# Patient Record
Sex: Male | Born: 1970 | Race: White | Hispanic: No | Marital: Single | Smoking: Current some day smoker
Health system: Southern US, Community
[De-identification: ages and names within clinical notes are randomized; demographics above are authoritative.]

## PROBLEM LIST (undated history)

## (undated) DIAGNOSIS — F1010 Alcohol abuse, uncomplicated: Secondary | ICD-10-CM

## (undated) DIAGNOSIS — F419 Anxiety disorder, unspecified: Secondary | ICD-10-CM

## (undated) DIAGNOSIS — I10 Essential (primary) hypertension: Secondary | ICD-10-CM

## (undated) DIAGNOSIS — A419 Sepsis, unspecified organism: Secondary | ICD-10-CM

## (undated) DIAGNOSIS — E119 Type 2 diabetes mellitus without complications: Secondary | ICD-10-CM

## (undated) DIAGNOSIS — K859 Acute pancreatitis without necrosis or infection, unspecified: Secondary | ICD-10-CM

## (undated) HISTORY — PX: ROTATOR CUFF REPAIR: SHX139

## (undated) HISTORY — PX: HERNIA REPAIR: SHX51

---

## 2004-11-15 ENCOUNTER — Emergency Department: Payer: Self-pay | Admitting: Emergency Medicine

## 2005-06-27 ENCOUNTER — Emergency Department: Payer: Self-pay | Admitting: Emergency Medicine

## 2007-07-30 ENCOUNTER — Emergency Department: Payer: Self-pay | Admitting: Unknown Physician Specialty

## 2007-07-30 ENCOUNTER — Other Ambulatory Visit: Payer: Self-pay

## 2007-11-10 ENCOUNTER — Other Ambulatory Visit: Payer: Self-pay

## 2007-11-10 ENCOUNTER — Emergency Department: Payer: Self-pay | Admitting: Emergency Medicine

## 2007-12-31 ENCOUNTER — Emergency Department: Payer: Self-pay | Admitting: Emergency Medicine

## 2008-04-29 ENCOUNTER — Emergency Department: Payer: Self-pay | Admitting: Internal Medicine

## 2008-11-24 ENCOUNTER — Emergency Department: Payer: Self-pay | Admitting: Emergency Medicine

## 2009-03-14 ENCOUNTER — Emergency Department: Payer: Self-pay | Admitting: Emergency Medicine

## 2009-09-20 ENCOUNTER — Emergency Department: Payer: Self-pay | Admitting: Emergency Medicine

## 2010-04-04 ENCOUNTER — Emergency Department: Payer: Self-pay | Admitting: Emergency Medicine

## 2010-11-11 ENCOUNTER — Emergency Department: Payer: Self-pay | Admitting: Emergency Medicine

## 2011-06-13 ENCOUNTER — Emergency Department: Payer: Self-pay | Admitting: Emergency Medicine

## 2012-10-16 ENCOUNTER — Emergency Department: Payer: Self-pay | Admitting: Emergency Medicine

## 2012-10-16 LAB — URINALYSIS, COMPLETE
Bilirubin,UR: NEGATIVE
Blood: NEGATIVE
Glucose,UR: NEGATIVE mg/dL (ref 0–75)
Leukocyte Esterase: NEGATIVE
Nitrite: POSITIVE
RBC,UR: <1 /HPF (ref 0–5)
Specific Gravity: 1.025 (ref 1.003–1.030)
Squamous Epithelial: NONE SEEN
WBC UR: 1 /HPF (ref 0–5)

## 2012-10-16 LAB — COMPREHENSIVE METABOLIC PANEL
Albumin: 3.4 g/dL (ref 3.4–5.0)
BUN: 8 mg/dL (ref 7–18)
Calcium, Total: 8.6 mg/dL (ref 8.5–10.1)
Chloride: 97 mmol/L — ABNORMAL LOW (ref 98–107)
Co2: 25 mmol/L (ref 21–32)
Creatinine: 0.92 mg/dL (ref 0.60–1.30)
EGFR (African American): >60
EGFR (Non-African Amer.): >60
Glucose: 122 mg/dL — ABNORMAL HIGH (ref 65–99)
Osmolality: 260 (ref 275–301)
Potassium: 3.7 mmol/L (ref 3.5–5.1)
SGPT (ALT): 37 U/L (ref 12–78)
Sodium: 130 mmol/L — ABNORMAL LOW (ref 136–145)
Total Protein: 7.8 g/dL (ref 6.4–8.2)

## 2012-10-16 LAB — LIPASE, BLOOD: Lipase: 272 U/L (ref 73–393)

## 2012-10-16 LAB — CBC
HCT: 38.5 % — ABNORMAL LOW (ref 40.0–52.0)
MCHC: 34.9 g/dL (ref 32.0–36.0)
MCV: 97 fL (ref 80–100)
Platelet: 120 10*3/uL — ABNORMAL LOW (ref 150–440)
RBC: 3.98 10*6/uL — ABNORMAL LOW (ref 4.40–5.90)
WBC: 11.4 10*3/uL — ABNORMAL HIGH (ref 3.8–10.6)

## 2014-08-18 ENCOUNTER — Emergency Department: Payer: Self-pay | Admitting: Emergency Medicine

## 2014-08-18 LAB — CBC
HCT: 47.3 % (ref 40.0–52.0)
HGB: 16.4 g/dL (ref 13.0–18.0)
MCH: 38 pg — ABNORMAL HIGH (ref 26.0–34.0)
MCHC: 34.6 g/dL (ref 32.0–36.0)
MCV: 110 fL — ABNORMAL HIGH (ref 80–100)
Platelet: 199 10*3/uL (ref 150–440)
RBC: 4.31 10*6/uL — ABNORMAL LOW (ref 4.40–5.90)
RDW: 14.5 % (ref 11.5–14.5)
WBC: 6.3 10*3/uL (ref 3.8–10.6)

## 2014-08-18 LAB — DRUG SCREEN, URINE
Amphetamines, Ur Screen: NEGATIVE (ref ?–1000)
BENZODIAZEPINE, UR SCRN: NEGATIVE (ref ?–200)
Barbiturates, Ur Screen: NEGATIVE (ref ?–200)
CANNABINOID 50 NG, UR ~~LOC~~: NEGATIVE (ref ?–50)
COCAINE METABOLITE, UR ~~LOC~~: POSITIVE (ref ?–300)
MDMA (Ecstasy)Ur Screen: NEGATIVE (ref ?–500)
METHADONE, UR SCREEN: NEGATIVE (ref ?–300)
OPIATE, UR SCREEN: NEGATIVE (ref ?–300)
PHENCYCLIDINE (PCP) UR S: NEGATIVE (ref ?–25)
TRICYCLIC, UR SCREEN: NEGATIVE (ref ?–1000)

## 2014-08-18 LAB — COMPREHENSIVE METABOLIC PANEL
ALBUMIN: 3.9 g/dL (ref 3.4–5.0)
ALK PHOS: 66 U/L
ALT: 54 U/L
AST: 75 U/L — AB (ref 15–37)
Anion Gap: 11 (ref 7–16)
BUN: 8 mg/dL (ref 7–18)
Bilirubin,Total: 0.5 mg/dL (ref 0.2–1.0)
CO2: 24 mmol/L (ref 21–32)
CREATININE: 0.76 mg/dL (ref 0.60–1.30)
Calcium, Total: 8.4 mg/dL — ABNORMAL LOW (ref 8.5–10.1)
Chloride: 107 mmol/L (ref 98–107)
EGFR (African American): >60
EGFR (Non-African Amer.): >60
GLUCOSE: 151 mg/dL — AB (ref 65–99)
Osmolality: 284 (ref 275–301)
POTASSIUM: 3.8 mmol/L (ref 3.5–5.1)
SODIUM: 142 mmol/L (ref 136–145)
Total Protein: 7.8 g/dL (ref 6.4–8.2)

## 2014-08-18 LAB — URINALYSIS, COMPLETE
Bacteria: NONE SEEN
Bilirubin,UR: NEGATIVE
Blood: NEGATIVE
GLUCOSE, UR: NEGATIVE mg/dL (ref 0–75)
LEUKOCYTE ESTERASE: NEGATIVE
NITRITE: NEGATIVE
PH: 6 (ref 4.5–8.0)
PROTEIN: NEGATIVE
RBC,UR: NONE SEEN /HPF (ref 0–5)
Specific Gravity: 1.024 (ref 1.003–1.030)
Squamous Epithelial: NONE SEEN

## 2014-08-18 LAB — ETHANOL: Ethanol: 258 mg/dL

## 2014-08-18 LAB — ACETAMINOPHEN LEVEL

## 2014-08-18 LAB — SALICYLATE LEVEL

## 2014-08-19 LAB — CLOSTRIDIUM DIFFICILE(ARMC)

## 2015-01-29 NOTE — Consult Note (Signed)
PATIENT NAME:  Andrew Buchanan, Andrew Buchanan MR#:  [id number] DATE OF BIRTH:  1971/03/23  DATE OF CONSULTATION:  08/18/2014  REFERRING PHYSICIAN:   CONSULTING PHYSICIAN:  Audery AmelJohn Buchanan. Rosibel Giacobbe, MD  IDENTIFYING INFORMATION AND REASON FOR CONSULTATION: A 44 year old man with a history of alcohol and substance abuse who was petitioned by his mother.   CHIEF COMPLAINT: "Not much."   HISTORY OF PRESENT ILLNESS: Information obtained from the past chart and the current chart, the commitment petition and the patient. Commitment paperwork filed by his mother states that he had come over to the house and threatened to kill his mother and also had made threats to kill his father and kill other people. She says that she thinks his mood is out of control and that his behavior is out of control and implies that he has bipolar disorder. The patient dismisses this entirely as being something made up by his mother. He says that he does not think he has much of a problem at all. He admits that he was talking to his mother and that she was demanding that he repay some of the money that he owed her. He denies that he made any threatening statements at all. He says his mood feels fine. His sleep sounds like it is erratic but does not seem to be presented as a problem. Denies suicidal or homicidal ideation. Denies any psychotic symptoms. The patient refuses to be specific about the amount that he is drinking, but calls it, "not much." Says he does not think that his drinking is a problem for him. He denies that he is abusing any other kind of drugs whatsoever.   PAST PSYCHIATRIC HISTORY: This patient has had multiple visits to our Emergency Room going back several years which follow a remarkably consistent pattern, exactly like what he is having today. His mother petitions him into the hospital, he is intoxicated, he does not seem to have any acute dangerousness and he is discharged. The patient denies that he has ever followed up with any  kind of outpatient psychiatric care. No history of suicide attempts. Denies being violent with his family.   SOCIAL HISTORY: The patient lives with his daughter. 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   PAST MEDICAL HISTORY: Says he has high blood pressure.   SUBSTANCE ABUSE HISTORY: As noted above, he drinks, which he admits to. Denies any history of seizures. Does not feel his drinking is a problem. Denies other drug abuse.   FAMILY HISTORY: Denies any family history of mental illness.   REVIEW OF SYSTEMS: No physical complaints currently. Denies any suicidal ideation. Denies any psychotic symptoms. Denies homicidal ideation. Does not appear to be in acute distress. Full review of systems unremarkable.   MENTAL STATUS EXAMINATION: Slightly disheveled gentleman who looks his stated age, who is passively cooperative with the interview. Eye contact good. Psychomotor activity calm. Speech decreased in total amount. Answers questions as curtly as he possibly can. Affect flat. Mood stated as fine. Thoughts show no sign of acute delusions. They appear to be organized but he is not giving us much information. Denies suicidal or homicidal ideation. Denies any hallucinations. He can recall 3/3 objects immediately, 1/3 at three minutes. He is alert and oriented x 4. His judgment and insight about his substance use is clearly impaired. Intelligence appears to be normal.   LABORATORY RESULTS: Drug screen is positive for cocaine. Urinalysis unremarkable. Salicylates and acetaminophen negative. Alcohol level 258. Chemistry panel: Low calcium  8.4, elevated glucose. Fairly unremarkable CBC, nothing requiring acute treatment.   VITAL SIGNS: Blood pressure 121/88, respirations 20, pulse 105, temperature 98.3.   ASSESSMENT: This is a 44 year old man with a history of alcohol abuse, but who has no insight into it being an acute problem and does not appear to be at acute risk from it. Also abusing  cocaine, which he will not admit to. This is one of several episodes of his mother petitioning him to the hospital and then he being found to not seem to have any acute illness. I have tried to call the mother on the phone and was not able to reach anyone. At this point, I suspect that the patient might have gotten into an argument with his mother, but he does not appear to be acutely agitated or threatening, there is no evidence of acute dangerousness, but he is intoxicated. I think we need to observe him at least until he sobers up before making a decision.   TREATMENT PLAN: Monitor vitals. Observe in the Emergency Room. Re-evaluate over the next day before deciding on any further disposition.   DIAGNOSIS, PRINCIPAL AND PRIMARY:  AXIS I:  1.  Alcohol abuse.  2.  Cocaine abuse    ____________________________ Audery Amel, MD jtc:TT D: 08/18/2014 15:41:33 ET Buchanan: 08/18/2014 15:59:05 ET JOB#: [id number]  cc: Audery Amel, MD, <Dictator> Audery Amel MD ELECTRONICALLY SIGNED 08/23/2014 17:03

## 2015-01-29 NOTE — Consult Note (Signed)
Psychiatry: PAtient seen and chart reviewed. PAtient is now sober. Mood is stable. He does have pain from a dental abcess and BP running high. No other new complaint. Affect calm and appropriate. Denies any homicidal ideation. Has positive plans for the future. No sign of delirium. Patient still does not see SA as an acute probleb. No longer meets commitment criteria. He is now off IVC and can be discharged from the ER. Encourage him to consider SA treatment in the future,. No change to diagnosis.  Electronic Signatures: Clapacs, Jackquline DenmarkJohn T (MD)  (Signed on 12-Nov-15 12:45)  Authored  Last Updated: 12-Nov-15 12:45 by Audery Amellapacs, John T (MD)

## 2016-06-15 ENCOUNTER — Emergency Department
Admission: EM | Admit: 2016-06-15 | Discharge: 2016-06-15 | Disposition: A | Discharge status: Home / Self Care | Payer: BLUE CROSS/BLUE SHIELD | Attending: Emergency Medicine | Admitting: Emergency Medicine

## 2016-06-15 ENCOUNTER — Encounter: Payer: Self-pay | Admitting: Emergency Medicine

## 2016-06-15 DIAGNOSIS — F1010 Alcohol abuse, uncomplicated: Secondary | ICD-10-CM | POA: Diagnosis not present

## 2016-06-15 DIAGNOSIS — I10 Essential (primary) hypertension: Secondary | ICD-10-CM | POA: Diagnosis not present

## 2016-06-15 DIAGNOSIS — F1910 Other psychoactive substance abuse, uncomplicated: Secondary | ICD-10-CM

## 2016-06-15 DIAGNOSIS — F1920 Other psychoactive substance dependence, uncomplicated: Secondary | ICD-10-CM | POA: Diagnosis not present

## 2016-06-15 HISTORY — DX: Alcohol abuse, uncomplicated: F10.10

## 2016-06-15 HISTORY — DX: Acute pancreatitis without necrosis or infection, unspecified: K85.90

## 2016-06-15 HISTORY — DX: Anxiety disorder, unspecified: F41.9

## 2016-06-15 HISTORY — DX: Essential (primary) hypertension: I10

## 2016-06-15 LAB — URINALYSIS COMPLETE WITH MICROSCOPIC (ARMC ONLY)
BILIRUBIN URINE: NEGATIVE
Bacteria, UA: NONE SEEN
GLUCOSE, UA: NEGATIVE mg/dL
HGB URINE DIPSTICK: NEGATIVE
KETONES UR: NEGATIVE mg/dL
LEUKOCYTES UA: NEGATIVE
Nitrite: NEGATIVE
Protein, ur: 30 mg/dL — AB
SPECIFIC GRAVITY, URINE: 1.028 (ref 1.005–1.030)
pH: 5 (ref 5.0–8.0)

## 2016-06-15 LAB — CBC
HEMATOCRIT: 42 % (ref 40.0–52.0)
Hemoglobin: 14.9 g/dL (ref 13.0–18.0)
MCH: 38.9 pg — ABNORMAL HIGH (ref 26.0–34.0)
MCHC: 35.4 g/dL (ref 32.0–36.0)
MCV: 109.9 fL — AB (ref 80.0–100.0)
Platelets: 101 10*3/uL — ABNORMAL LOW (ref 150–440)
RBC: 3.82 MIL/uL — ABNORMAL LOW (ref 4.40–5.90)
RDW: 14.7 % — AB (ref 11.5–14.5)
WBC: 4.9 10*3/uL (ref 3.8–10.6)

## 2016-06-15 LAB — COMPREHENSIVE METABOLIC PANEL
ALBUMIN: 5 g/dL (ref 3.5–5.0)
ALT: 60 U/L (ref 17–63)
AST: 99 U/L — AB (ref 15–41)
Alkaline Phosphatase: 65 U/L (ref 38–126)
Anion gap: 14 (ref 5–15)
BUN: 13 mg/dL (ref 6–20)
CHLORIDE: 96 mmol/L — AB (ref 101–111)
CO2: 24 mmol/L (ref 22–32)
CREATININE: 0.8 mg/dL (ref 0.61–1.24)
Calcium: 10.3 mg/dL (ref 8.9–10.3)
GFR calc Af Amer: >60 mL/min (ref >60)
GFR calc non Af Amer: >60 mL/min (ref >60)
GLUCOSE: 139 mg/dL — AB (ref 65–99)
Potassium: 3.4 mmol/L — ABNORMAL LOW (ref 3.5–5.1)
SODIUM: 134 mmol/L — AB (ref 135–145)
Total Bilirubin: 1 mg/dL (ref 0.3–1.2)
Total Protein: 8.7 g/dL — ABNORMAL HIGH (ref 6.5–8.1)

## 2016-06-15 LAB — ETHANOL: Alcohol, Ethyl (B): <5 mg/dL (ref <5)

## 2016-06-15 LAB — LIPASE, BLOOD: Lipase: 77 U/L — ABNORMAL HIGH (ref 11–51)

## 2016-06-15 LAB — URINE DRUG SCREEN, QUALITATIVE (ARMC ONLY)
AMPHETAMINES, UR SCREEN: NOT DETECTED
BENZODIAZEPINE, UR SCRN: POSITIVE — AB
Barbiturates, Ur Screen: NOT DETECTED
Cannabinoid 50 Ng, Ur ~~LOC~~: NOT DETECTED
Cocaine Metabolite,Ur ~~LOC~~: NOT DETECTED
MDMA (Ecstasy)Ur Screen: NOT DETECTED
Methadone Scn, Ur: NOT DETECTED
OPIATE, UR SCREEN: POSITIVE — AB
PHENCYCLIDINE (PCP) UR S: NOT DETECTED
Tricyclic, Ur Screen: NOT DETECTED

## 2016-06-15 MED ORDER — IBUPROFEN 600 MG PO TABS
600.0000 mg | ORAL_TABLET | Freq: Once | ORAL | Status: DC
  Filled 2016-06-15: qty 1

## 2016-06-15 MED ORDER — ONDANSETRON 4 MG PO TBDP: 4.0000 mg | ORAL_TABLET | Freq: Three times a day (TID) | ORAL | 0 refills | Status: DC | PRN

## 2016-06-15 NOTE — BH Assessment (Signed)
Per [facility], patient had to pay a $300.00 up front co-pay due to having [employer]. Patient was unable to pay it. He talked with [facility] and set up arrangements to go to them on Monday. He also spoke with [facility]. He requested to have his lab work [facility]. Writer had the patient to sign a release of information form for both facilities. Hard copies and fax confirmation pages placed on the patients paper chart.

## 2016-06-15 NOTE — ED Provider Notes (Signed)
[facility] Emergency Department Provider Note   ____________________________________________    I have reviewed the triage vital signs and the nursing notes.   HISTORY  Chief Complaint Alcohol Problem     HPI Dayton ScrapeJohnnie Fannie Kneeodd Buchanan is a 45 y.o. male Who presents with a desire for alcohol detox. Patient reports he drinks daily and has done so for many years. He would like to get a residential treatment service. He last drank last night. He denies jitteriness, palpitations. No history of seizures.No nausea or vomiting currently. No abdominal pain currently.   Past Medical History:  Diagnosis Date  . Alcohol abuse   . Anxiety   . Hypertension   . Pancreatitis     There are no active problems to display for this patient.   History reviewed. No pertinent surgical history.  Prior to Admission medications   Medication Sig Start Date End Date Taking? Authorizing Provider  clonazePAM (KLONOPIN) 1 MG tablet Take 1 mg by mouth 3 (three) times daily as needed for anxiety.   Yes Historical Provider, MD  losartan (COZAAR) 50 MG tablet Take 50 mg by mouth daily.   Yes Historical Provider, MD     Allergies Antihistamines, chlorpheniramine-type  No family history on file.  Social History Social History  Substance Use Topics  . Smoking status: Never Smoker  . Smokeless tobacco: Never Used  . Alcohol use Yes    Review of Systems  Constitutional: No fever/chills Eyes: No visual changes.   Cardiovascular: Denies chest pain. Respiratory: Denies shortness of breath. Gastrointestinal: No abdominal pain.  No nausea, no vomiting.    Musculoskeletal: Negative for back pain. Skin: patient reportsbruising on his right arm Neurological: Negative for headaches or weakness  10-point ROS otherwise negative.  ____________________________________________   PHYSICAL EXAM:  VITAL SIGNS: ED Triage Vitals  Enc Vitals Group     BP 06/15/16 1310 (!)  155/107     Pulse Rate 06/15/16 1310 87     Resp 06/15/16 1310 20     Temp 06/15/16 1310 98 F (36.7 C)     Temp Source 06/15/16 1310 Oral     SpO2 06/15/16 1310 96 %     Weight 06/15/16 1306 186 lb (84.4 kg)     Height 06/15/16 1306 6' (1.829 m)     Head Circumference --      Peak Flow --      Pain Score --      Pain Loc --      Pain Edu? --      Excl. in GC? --     Constitutional: Alert and oriented. No acute distress. Pleasant and interactive Eyes: Conjunctivae are normal.  Head: Atraumatic. Nose: No congestion/rhinnorhea. Mouth/Throat: Mucous membranes are moist.   Neck:  Painless ROM Cardiovascular: Normal rate, regular rhythm. Grossly normal heart sounds.  Good peripheral circulation. Respiratory: Normal respiratory effort.  No retractions. Lungs CTAB. Gastrointestinal: Soft and nontender. No distention.  No CVA tenderness.fading bruise noted to the patient's right lateral abdomen, no tenderness to palpation Genitourinary: deferred Musculoskeletal: No lower extremity tenderness nor edema.  Warm and well perfused Neurologic:  Normal speech and language. No gross focal neurologic deficits are appreciated.  Skin:  Skin is warm, dry and intact. Bruises noted to right arm Psychiatric: Mood and affect are normal. Speech and behavior are normal.  ____________________________________________   LABS (all labs ordered are listed, but only abnormal results are displayed)  Labs Reviewed  COMPREHENSIVE METABOLIC PANEL - Abnormal; Notable  for the following:       Result Value   Sodium 134 (*)    Potassium 3.4 (*)    Chloride 96 (*)    Glucose, Bld 139 (*)    Total Protein 8.7 (*)    AST 99 (*)    All other components within normal limits  CBC - Abnormal; Notable for the following:    RBC 3.82 (*)    MCV 109.9 (*)    MCH 38.9 (*)    RDW 14.7 (*)    Platelets 101 (*)    All other components within normal limits  URINE DRUG SCREEN, QUALITATIVE ([facility] ONLY) - Abnormal;  Notable for the following:    Opiate, Ur Screen POSITIVE (*)    Benzodiazepine, Ur Scrn POSITIVE (*)    All other components within normal limits  LIPASE, BLOOD - Abnormal; Notable for the following:    Lipase 77 (*)    All other components within normal limits  URINALYSIS COMPLETEWITH MICROSCOPIC ([facility] ONLY) - Abnormal; Notable for the following:    Color, Urine AMBER (*)    APPearance CLEAR (*)    Protein, ur 30 (*)    Squamous Epithelial / LPF 0-5 (*)    All other components within normal limits  ETHANOL   ____________________________________________  EKG  None ____________________________________________  RADIOLOGY  None ____________________________________________   PROCEDURES  Procedure(s) performed: No    Critical Care performed: No ____________________________________________   INITIAL IMPRESSION / ASSESSMENT AND PLAN / ED COURSE  Pertinent labs & imaging results that were available during my care of the patient were reviewed by me and considered in my medical decision making (see chart for details).  Patient with history of chronic alcoholism. Desires detox. I have asked behavioral medicine team to attempt to arrange for [facility] bed  Clinical Course   ____________________________________________   FINAL CLINICAL IMPRESSION(S) / ED DIAGNOSES  Final diagnoses:  Alcohol abuse      NEW MEDICATIONS STARTED DURING THIS VISIT:  New Prescriptions   No medications on file     Note:  This document was prepared using Dragon voice recognition software and may include unintentional dictation errors.    Jene Every, MD 06/15/16 1524

## 2016-06-15 NOTE — ED Notes (Signed)
Pt refused ibuprofen. Pt states ibuprofen, tylenol and tramadol increase abdominal pain. Pt states he takes percocet at home for the pain. Dr. Cyril LoosenKinner made aware.

## 2016-06-15 NOTE — Discharge Instructions (Signed)
Please follow-up with [facility] as directed for detox and treatment of your alcohol and drug abuse.

## 2016-06-15 NOTE — ED Notes (Signed)
Pt reports he drinks a 1/5 of liquor on weekdays and 1/2 gallon of liquor on weekends. Pt states he would like help to stop drinking. Pt states he is worried that he has pancreatitis as well.

## 2016-06-15 NOTE — ED Provider Notes (Signed)
Discussed with Calvin from [facility]. Patient accepted to [facility], but the patient does not currently have the $300 co-pay he would need to provide to continue his treatment there. Patient chooses to go home with his mother for now, and on Monday he will follow up with [facility] when he has the money. He otherwise is insured and should not have other barriers to care. We'll provide him a prescription for Zofran for now. Return precautions given. She is medically and psychiatrically stable, not a danger to himself at this time, not a danger to others, no evidence of withdrawal at this time.   Sharman CheekPhillip Nioma Mccubbins, MD 06/15/16 1719

## 2016-06-15 NOTE — BH Assessment (Signed)
Assessment Note  Andrew Buchanan is an 45 y.o. male who presents to the ER seeking assistance for alcohol detox. He states he is drinking a fifth of liquor on a daily basis. He has drank like this for approximately two years. When he has tried to stop, he would start having withdrawal symptoms and start back drinking within two days. His symptoms of withdrawal are; vomiting with no blood, nausea, shakes, some dizziness and cold chills. He denies past and current seizures and black out.  He also admits to abusing pain pills and they are on a daily basis as well.  He denies having a history of aggression and violence. He also denies SI/HI and AV/H.Marland Kitchen.     Diagnosis: Alcohol Use Disorder, Severe  Past Medical History:  Past Medical History:  Diagnosis Date  . Alcohol abuse   . Anxiety   . Hypertension   . Pancreatitis     History reviewed. No pertinent surgical history.  Family History: No family history on file.  Social History:  reports that he has never smoked. He has never used smokeless tobacco. He reports that he drinks alcohol. His drug history is not on file.  Additional Social History:  Alcohol / Drug Use Pain Medications: See PTA Prescriptions: See PTA Over the Counter: See PTA History of alcohol / drug use?: Yes Longest period of sobriety (when/how long): "Two days" Negative Consequences of Use: Financial, Legal, Personal relationships, Work / School Substance #1 Name of Substance 1: Alcohol 1 - Age of First Use: 15 1 - Amount (size/oz): "A fifth" 1 - Frequency: Daily 1 - Duration: "Like this a year." 1 - Last Use / Amount: 06/14/2016 Substance #2 Name of Substance 2: "Percocet & Vicodin" 2 - Age of First Use: 45 2 - Amount (size/oz): "Four 10mg " 2 - Frequency: Daily 2 - Duration: "Four months now." 2 - Last Use / Amount: 06/15/2016  CIWA: CIWA-Ar BP: (!) 155/107 Pulse Rate: 87 Nausea and Vomiting: no nausea and no vomiting Tactile Disturbances: very mild  itching, pins and needles, burning or numbness Tremor: no tremor Auditory Disturbances: not present Paroxysmal Sweats: no sweat visible Visual Disturbances: not present Anxiety: moderately anxious, or guarded, so anxiety is inferred Headache, Fullness in Head: moderately severe Agitation: normal activity Orientation and Clouding of Sensorium: oriented and can do serial additions CIWA-Ar Total: 9 COWS:    Allergies:  Allergies  Allergen Reactions  . Antihistamines, Chlorpheniramine-Type Anaphylaxis    Home Medications:  (Not in a hospital admission)  OB/GYN Status:  No LMP for male patient.  General Assessment Data [facility] Assessment: In system Is this a Tele or Face-to-Face Assessment?: Face-to-Face Is this an Initial Assessment or a Re-assessment for this encounter?: Initial Assessment Marital status: Long term relationship Maiden name: n/a Is patient pregnant?: No Pregnancy Status: No Living Arrangements: Non-relatives/Friends, Parent Can pt return to current living arrangement?: Yes Admission Status: Voluntary Is patient capable of signing voluntary admission?: Yes Referral Source: Self/Family/Friend Insurance type: Scientist, research (physical sciences)BCBS  Medical Screening Exam [facility]-in ONLY) Medical Exam completed: Yes  Crisis Care Plan Living Arrangements: Non-relatives/Friends, Parent Legal Guardian: Other: (None) Name of Psychiatrist: Reports of none Name of Therapist: Reports of none  Education Status Is patient currently in school?: No Current Grade: n/a Highest grade of school patient has completed: Associate Degree Name of school: n/a Contact person: n/a  Risk to self with the past 6 months Suicidal Ideation: No Has patient been a risk to self within  the past 6 months prior to admission? : No Suicidal Intent: No Has patient had any suicidal intent within the past 6 months prior to admission? : No Is patient at risk for suicide?: No Suicidal Plan?:  No Has patient had any suicidal plan within the past 6 months prior to admission? : No Access to Means: No What has been your use of drugs/alcohol within the last 12 months?: Alcohol, Percocet & Vicodin Previous Attempts/Gestures: No How many times?: 0 Other Self Harm Risks: Active Addiction Triggers for Past Attempts: None known Intentional Self Injurious Behavior: None Family Suicide History: No Recent stressful life event(s): Financial Problems, Loss (Comment), Conflict (Comment), Legal Issues, Recent negative physical changes, Other (Comment) Persecutory voices/beliefs?: No Depression: Yes Depression Symptoms: Tearfulness, Feeling worthless/self pity, Loss of interest in usual pleasures, Fatigue Substance abuse history and/or treatment for substance abuse?: Yes Suicide prevention information given to non-admitted patients: Not applicable  Risk to Others within the past 6 months Homicidal Ideation: No Does patient have any lifetime risk of violence toward others beyond the six months prior to admission? : No Thoughts of Harm to Others: No Current Homicidal Intent: No Current Homicidal Plan: No Access to Homicidal Means: No Identified Victim: Reports of none History of harm to others?: No Assessment of Violence: None Noted Violent Behavior Description: Reports of none Does patient have access to weapons?: No Criminal Charges Pending?: No Does patient have a court date: No Is patient on probation?: No  Psychosis Hallucinations: None noted Delusions: None noted  Mental Status Report Appearance/Hygiene: Unremarkable Eye Contact: Good Motor Activity: Restlessness, Unremarkable, Freedom of movement Speech: Logical/coherent, Unremarkable, Rapid Level of Consciousness: Alert Mood: Depressed, Anxious, Guilty, Helpless, Sad, Pleasant Affect: Appropriate to circumstance, Anxious, Sad Anxiety Level: Minimal Thought Processes: Coherent, Relevant Judgement: Partial Orientation:  Person, Place, Time, Situation, Appropriate for developmental age Obsessive Compulsive Thoughts/Behaviors: None  Cognitive Functioning Concentration: Normal Memory: Recent Intact, Remote Intact IQ: Average Insight: Fair Impulse Control: Fair Appetite: Good Weight Loss: 0 Weight Gain: 0 Sleep: Decreased Total Hours of Sleep: 3 Vegetative Symptoms: None  ADLScreening [facility] Assessment Services) Patient's cognitive ability adequate to safely complete daily activities?: Yes Patient able to express need for assistance with ADLs?: Yes Independently performs ADLs?: Yes (appropriate for developmental age)  Prior Inpatient Therapy Prior Inpatient Therapy: Yes Prior Therapy Dates: 1998 & 1994 Prior Therapy Facilty/Provider(s): "[facility]" [place], [place]) Reason for Treatment: Alcohol  Prior Outpatient Therapy Prior Outpatient Therapy: No Prior Therapy Dates: Reports of none Prior Therapy Facilty/Provider(s): Reports of none Reason for Treatment: Reports of none Does patient have an ACCT team?: No Does patient have Intensive In-House Services?  : No Does patient have [facility] services? : No Does patient have P4CC services?: No  ADL Screening (condition at time of admission) Patient's cognitive ability adequate to safely complete daily activities?: Yes Is the patient deaf or have difficulty hearing?: No Does the patient have difficulty seeing, even when wearing glasses/contacts?: No Does the patient have difficulty concentrating, remembering, or making decisions?: No Patient able to express need for assistance with ADLs?: Yes Does the patient have difficulty dressing or bathing?: No Independently performs ADLs?: Yes (appropriate for developmental age) Does the patient have difficulty walking or climbing stairs?: No Weakness of Legs: None Weakness of Arms/Hands: None  Home Assistive Devices/Equipment Home Assistive Devices/Equipment: None  Therapy Consults (therapy  consults require a physician order) PT Evaluation Needed: No OT Evalulation Needed: No SLP Evaluation Needed: No Abuse/Neglect Assessment (Assessment to be complete while patient  is alone) Physical Abuse: Denies Verbal Abuse: Denies Sexual Abuse: Denies Exploitation of patient/patient's resources: Denies Self-Neglect: Denies Values / Beliefs Cultural Requests During Hospitalization: None Spiritual Requests During Hospitalization: None Consults Spiritual Care Consult Needed: No Social Work Consult Needed: No Merchant navy officer (For Healthcare) Does patient have an advance directive?: No    Additional Information 1:1 In Past 12 Months?: No CIRT Risk: No Elopement Risk: No Does patient have medical clearance?: Yes  Child/Adolescent Assessment Running Away Risk: Denies (Patient is an adult)  Disposition:  Disposition Initial Assessment Completed for this Encounter: Yes Disposition of Patient: Other dispositions (Refer to [employer])  On Site Evaluation by:   Reviewed with Physician:    Lilyan Gilford MS, LCAS, LPC, NCC, CCSI Therapeutic Triage Specialist 06/15/2016 5:31 PM

## 2016-06-15 NOTE — ED Triage Notes (Signed)
Presents with family  Requesting alcohol detox  Also having some abd pain   Pos  N/v  Last time vomited was this am

## 2018-11-26 ENCOUNTER — Encounter: Payer: Self-pay | Admitting: *Deleted

## 2018-11-26 ENCOUNTER — Emergency Department
Admission: EM | Admit: 2018-11-26 | Discharge: 2018-11-26 | Disposition: A | Discharge status: Home / Self Care | Payer: BLUE CROSS/BLUE SHIELD | Attending: Emergency Medicine | Admitting: Emergency Medicine

## 2018-11-26 ENCOUNTER — Other Ambulatory Visit: Payer: Self-pay

## 2018-11-26 DIAGNOSIS — R45851 Suicidal ideations: Secondary | ICD-10-CM | POA: Insufficient documentation

## 2018-11-26 DIAGNOSIS — F310 Bipolar disorder, current episode hypomanic: Secondary | ICD-10-CM

## 2018-11-26 DIAGNOSIS — F10121 Alcohol abuse with intoxication delirium: Secondary | ICD-10-CM | POA: Diagnosis present

## 2018-11-26 DIAGNOSIS — F419 Anxiety disorder, unspecified: Secondary | ICD-10-CM | POA: Diagnosis not present

## 2018-11-26 DIAGNOSIS — F1010 Alcohol abuse, uncomplicated: Secondary | ICD-10-CM

## 2018-11-26 DIAGNOSIS — F1090 Alcohol use, unspecified, uncomplicated: Secondary | ICD-10-CM

## 2018-11-26 DIAGNOSIS — Z79899 Other long term (current) drug therapy: Secondary | ICD-10-CM | POA: Diagnosis not present

## 2018-11-26 DIAGNOSIS — F10120 Alcohol abuse with intoxication, uncomplicated: Secondary | ICD-10-CM | POA: Diagnosis present

## 2018-11-26 DIAGNOSIS — R45850 Homicidal ideations: Secondary | ICD-10-CM

## 2018-11-26 DIAGNOSIS — I10 Essential (primary) hypertension: Secondary | ICD-10-CM | POA: Diagnosis not present

## 2018-11-26 DIAGNOSIS — F319 Bipolar disorder, unspecified: Secondary | ICD-10-CM | POA: Diagnosis not present

## 2018-11-26 LAB — COMPREHENSIVE METABOLIC PANEL
ALK PHOS: 43 U/L (ref 38–126)
ALT: 51 U/L — ABNORMAL HIGH (ref 0–44)
AST: 45 U/L — AB (ref 15–41)
Albumin: 4.6 g/dL (ref 3.5–5.0)
Anion gap: 15 (ref 5–15)
BILIRUBIN TOTAL: 0.7 mg/dL (ref 0.3–1.2)
BUN: 13 mg/dL (ref 6–20)
CALCIUM: 9.6 mg/dL (ref 8.9–10.3)
CO2: 20 mmol/L — ABNORMAL LOW (ref 22–32)
CREATININE: 1.59 mg/dL — AB (ref 0.61–1.24)
Chloride: 108 mmol/L (ref 98–111)
GFR calc Af Amer: 59 mL/min — ABNORMAL LOW (ref >60)
GFR, EST NON AFRICAN AMERICAN: 51 mL/min — AB (ref >60)
Glucose, Bld: 117 mg/dL — ABNORMAL HIGH (ref 70–99)
Potassium: 3.5 mmol/L (ref 3.5–5.1)
Sodium: 143 mmol/L (ref 135–145)
TOTAL PROTEIN: 7.6 g/dL (ref 6.5–8.1)

## 2018-11-26 LAB — URINE DRUG SCREEN, QUALITATIVE (ARMC ONLY)
AMPHETAMINES, UR SCREEN: NOT DETECTED
BENZODIAZEPINE, UR SCRN: POSITIVE — AB
Barbiturates, Ur Screen: NOT DETECTED
Cannabinoid 50 Ng, Ur ~~LOC~~: NOT DETECTED
Cocaine Metabolite,Ur ~~LOC~~: NOT DETECTED
MDMA (ECSTASY) UR SCREEN: NOT DETECTED
Methadone Scn, Ur: NOT DETECTED
OPIATE, UR SCREEN: NOT DETECTED
PHENCYCLIDINE (PCP) UR S: NOT DETECTED
Tricyclic, Ur Screen: NOT DETECTED

## 2018-11-26 LAB — CBC
HEMATOCRIT: 46.2 % (ref 39.0–52.0)
Hemoglobin: 16 g/dL (ref 13.0–17.0)
MCH: 33.5 pg (ref 26.0–34.0)
MCHC: 34.6 g/dL (ref 30.0–36.0)
MCV: 96.9 fL (ref 80.0–100.0)
NRBC: 0 % (ref 0.0–0.2)
PLATELETS: 192 10*3/uL (ref 150–400)
RBC: 4.77 MIL/uL (ref 4.22–5.81)
RDW: 12.9 % (ref 11.5–15.5)
WBC: 8.7 10*3/uL (ref 4.0–10.5)

## 2018-11-26 LAB — ETHANOL: Alcohol, Ethyl (B): 218 mg/dL — ABNORMAL HIGH (ref <10)

## 2018-11-26 MED ORDER — NALTREXONE HCL 50 MG PO TABS: 50.0000 mg | ORAL_TABLET | Freq: Every day | ORAL | Status: DC

## 2018-11-26 MED ORDER — VITAMIN D (ERGOCALCIFEROL) 1.25 MG (50000 UNIT) PO CAPS
50000.0000 [IU] | ORAL_CAPSULE | ORAL | Status: DC
  Filled 2018-11-26 (×2): qty 1

## 2018-11-26 MED ORDER — METFORMIN HCL 500 MG PO TABS
1000.0000 mg | ORAL_TABLET | Freq: Two times a day (BID) | ORAL | Status: DC
  Administered 2018-11-26: 1000 mg via ORAL
  Filled 2018-11-26: qty 2

## 2018-11-26 MED ORDER — PIOGLITAZONE HCL 30 MG PO TABS
30.0000 mg | ORAL_TABLET | Freq: Every day | ORAL | Status: DC
  Filled 2018-11-26: qty 1

## 2018-11-26 MED ORDER — GABAPENTIN 300 MG PO CAPS
600.0000 mg | ORAL_CAPSULE | Freq: Two times a day (BID) | ORAL | Status: DC
  Administered 2018-11-26: 600 mg via ORAL
  Filled 2018-11-26: qty 2

## 2018-11-26 MED ORDER — DIVALPROEX SODIUM ER 500 MG PO TB24: 1000.0000 mg | ORAL_TABLET | Freq: Every day | ORAL | Status: DC

## 2018-11-26 MED ORDER — NALTREXONE HCL 50 MG PO TABS: 50.0000 mg | ORAL_TABLET | Freq: Every day | ORAL | 0 refills | Status: DC

## 2018-11-26 MED ORDER — LOSARTAN POTASSIUM 50 MG PO TABS
50.0000 mg | ORAL_TABLET | Freq: Every day | ORAL | Status: DC
  Filled 2018-11-26: qty 1

## 2018-11-26 MED ORDER — DIVALPROEX SODIUM ER 500 MG PO TB24: 1000.0000 mg | ORAL_TABLET | Freq: Every day | ORAL | 0 refills | Status: DC

## 2018-11-26 MED ORDER — PANTOPRAZOLE SODIUM 40 MG PO TBEC
40.0000 mg | DELAYED_RELEASE_TABLET | Freq: Every day | ORAL | Status: DC
  Administered 2018-11-26: 40 mg via ORAL
  Filled 2018-11-26: qty 1

## 2018-11-26 NOTE — ED Notes (Signed)
Pt observed with no unusual behavior  Appropriate to stimulation  No verbalized needs or concerns at this time  NAD assessed  Continue to monitor 

## 2018-11-26 NOTE — ED Notes (Signed)
Hourly rounding reveals patient sleeping in room. No complaints, stable, in no acute distress. Q15 minute rounds and monitoring via Security Cameras to continue. 

## 2018-11-26 NOTE — Consult Note (Addendum)
[facility] Face-to-Face Psychiatry Consult   Reason for Consult:  Intoxication Referring Physician:  Dr. Manson Passey Patient Identification: Andrew Buchanan MRN:  [id number] Principal Diagnosis: <principal problem not specified> Diagnosis:  Active Problems:   Alcohol intoxication delirium (HCC)   Total Time spent with patient: 30 minutes  Subjective:   Andrew Buchanan is a 48 y.o. male patient presented to [facility] ED via [employer]. The patient states that he does not know why he was brought in to the hospital. The patient was very irate and refused to answer most questions. The patient was seen face-to-face by Andrew provider; chart reviewed and consulted with Dr. Lucianne Muss on 11/26/2018. The patient should sleep off his intoxication and be reassessed in the morning. On evaluation Andrew Buchanan reports that he does not know why he is here. During his evaluation, the patient is alert and oriented x4, angry and un-cooperative, he is a little threatening during his assessment. The patient does not appear to be responding to internal or external stimuli. Neither is the patient presenting with any delusional thinking. The patient denies any suicidal, homicidal, or self-harm ideations. The patient is not presenting with any psychotic or paranoid behaviors. During an encounter with the patient, he was not able to answer questions appropriately.  HPI:  None  Past Psychiatric History: Substance use  Risk to Self:  yes Risk to Others:  Yes (becoming loud, standing up  And trying to question Andrew Buchanan. Prior Inpatient Therapy:  yes Prior Outpatient Therapy:  Yes  Past Medical History:  Past Medical History:  Diagnosis Date  . Alcohol abuse   . Anxiety   . Hypertension   . Pancreatitis    No past surgical history on file. Family History: None on file Family Psychiatric  History: None given Social History:  None given Social History   Substance and Sexual Activity  Alcohol Use Yes      Social History   Substance and Sexual Activity  Drug Use Not on file    Social History   Socioeconomic History  . Marital status: Single    Spouse name: Not on file  . Number of children: Not on file  . Years of education: Not on file  . Highest education level: Not on file  Occupational History  . Not on file  Social Needs  . Financial resource strain: Not on file  . Food insecurity:    Worry: Not on file    Inability: Not on file  . Transportation needs:    Medical: Not on file    Non-medical: Not on file  Tobacco Use  . Smoking status: Never Smoker  . Smokeless tobacco: Never Used  Substance and Sexual Activity  . Alcohol use: Yes  . Drug use: Not on file  . Sexual activity: Not on file  Lifestyle  . Physical activity:    Days per week: Not on file    Minutes per session: Not on file  . Stress: Not on file  Relationships  . Social connections:    Talks on phone: Not on file    Gets together: Not on file    Attends religious service: Not on file    Active member of club or organization: Not on file    Attends meetings of clubs or organizations: Not on file    Relationship status: Not on file  Other Topics Concern  . Not on file  Social History Narrative  . Not on file   Additional Social History:  Allergies:   Allergies  Allergen Reactions  . Antihistamines, Chlorpheniramine-Type Anaphylaxis    Labs:  Results for orders placed or performed during the hospital encounter of 11/26/18 (from the past 48 hour(s))  Comprehensive metabolic panel     Status: Abnormal   Collection Time: 11/26/18  2:20 AM  Result Value Ref Range   Sodium 143 135 - 145 mmol/L   Potassium 3.5 3.5 - 5.1 mmol/L   Chloride 108 98 - 111 mmol/L   CO2 20 (L) 22 - 32 mmol/L   Glucose, Bld 117 (H) 70 - 99 mg/dL   BUN 13 6 - 20 mg/dL   Creatinine, Ser 7.74 (H) 0.61 - 1.24 mg/dL   Calcium 9.6 8.9 - 12.8 mg/dL   Total Protein 7.6 6.5 - 8.1 g/dL   Albumin 4.6 3.5 - 5.0 g/dL   AST  45 (H) 15 - 41 U/L   ALT 51 (H) 0 - 44 U/L   Alkaline Phosphatase 43 38 - 126 U/L   Total Bilirubin 0.7 0.3 - 1.2 mg/dL   GFR calc non Af Amer 51 (L) >60 mL/min   GFR calc Af Amer 59 (L) >60 mL/min   Anion gap 15 5 - 15    Comment: Performed at [facility], [address] [place], [place] [address]  Ethanol     Status: Abnormal   Collection Time: 11/26/18  2:20 AM  Result Value Ref Range   Alcohol, Ethyl (B) 218 (H) <10 mg/dL    Comment: (NOTE) Lowest detectable limit for serum alcohol is 10 mg/dL. For medical purposes only. Performed at [facility], [address] [place], [place] [address]   cbc     Status: None   Collection Time: 11/26/18  2:20 AM  Result Value Ref Range   WBC 8.7 4.0 - 10.5 K/uL   RBC 4.77 4.22 - 5.81 MIL/uL   Hemoglobin 16.0 13.0 - 17.0 g/dL   HCT 70.9 62.8 - 36.6 %   MCV 96.9 80.0 - 100.0 fL   MCH 33.5 26.0 - 34.0 pg   MCHC 34.6 30.0 - 36.0 g/dL   RDW 29.4 76.5 - 46.5 %   Platelets 192 150 - 400 K/uL   nRBC 0.0 0.0 - 0.2 %    Comment: Performed at [facility], [address] [place], [place] [address]    No current facility-administered medications for Andrew encounter.    No current outpatient medications on file.    Musculoskeletal: Strength & Muscle Tone: within normal limits Gait & Station: unsteady Patient leans: Right, Left and Front  Psychiatric Specialty Exam: Physical Exam  Nursing note and vitals reviewed. Constitutional: He is oriented to person, place, and time. He appears well-developed and well-nourished.  Eyes: Pupils are equal, round, and reactive to light. Conjunctivae and EOM are normal.  Neck: Normal range of motion. Neck supple.  Cardiovascular: Normal rate and regular rhythm.  Respiratory: Effort normal and breath sounds normal.  Musculoskeletal: Normal range of motion.  Neurological: He is alert and oriented to person, place, and time. He has normal reflexes.  Skin: Skin is warm and  dry.    Review of Systems  Constitutional: Negative.   HENT: Negative.   Eyes: Negative.   Respiratory: Negative.   Cardiovascular: Negative.   Gastrointestinal: Negative.   Genitourinary: Negative.   Musculoskeletal: Negative.   Neurological: Positive for tremors.  Endo/Heme/Allergies: Negative.   Psychiatric/Behavioral: Negative for depression.    Blood pressure 96/71, pulse (!) 110, temperature 97.6  F (36.4 C), temperature source Oral, resp. rate 20, height 6' (1.829 m), weight 90.7 kg, SpO2 99 %.Body mass index is 27.12 kg/m.  General Appearance: Disheveled  Eye Contact:  Minimal  Speech:  Garbled, Pressured and Slurred  Volume:  Increased  Mood:  Angry, Euphoric and Irritable  Affect:  Inappropriate  Thought Process:  Disorganized  Orientation:  Full (Time, Place, and Person)  Thought Content:  Illogical, Delusions and Ilusions  Suicidal Thoughts:  No  Homicidal Thoughts:  No  Memory:  Immediate;   Poor  Judgement:  Poor  Insight:  Lacking  Psychomotor Activity:  Decreased  Concentration:  Concentration: Poor  Recall:  Poor  Fund of Knowledge:  Poor  Language:  Good  Akathisia:  No  Handed:  Right  AIMS (if indicated):     Assets:  Desire for Improvement Financial Resources/Insurance Vocational/Educational  ADL's:  Intact  Cognition:  WNL  Sleep:        Treatment Plan Summary: Daily contact with patient to assess and evaluate symptoms and progress in treatment and Plan Patient is intoxicated and needs to sleep it off  Disposition: No evidence of imminent risk to self or others at present.   Patient does not meet criteria for psychiatric inpatient admission. Supportive therapy provided about ongoing stressors. Refer to IOP. Discussed crisis plan, support from social network, calling 911, coming to the Emergency Department, and calling Suicide Hotline.  Catalina GravelJacqueline Thomspon, NP 11/26/2018 5:39 AM

## 2018-11-26 NOTE — ED Notes (Signed)
BEHAVIORAL HEALTH ROUNDING Patient sleeping: No. Patient alert and oriented: yes Behavior appropriate: Yes.  ; If no, describe:  Nutrition and fluids offered: yes Toileting and hygiene offered: Yes  Sitter present: q15 minute observations and security camera monitoring Law enforcement present: Yes  ODS  
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
## 2018-11-26 NOTE — ED Notes (Signed)
BEHAVIORAL HEALTH ROUNDING Patient sleeping: No. Patient alert and oriented: yes Behavior appropriate: Yes.  ; If no, describe:  Nutrition and fluids offered: yes Toileting and hygiene offered: Yes  Sitter present: q15 minute observations and security  monitoring Law enforcement present: Yes  ODS  

## 2018-11-26 NOTE — ED Notes (Signed)
Pt. Transferred from Triage to room after dressing out and screening for contraband. Pt. Oriented to Quad including Q15 minute rounds as well as Rover and Officer for their protection. Patient is alert and oriented, warm and dry in no acute distress. Patient denies SI, HI, and AVH. Pt. Encouraged to let me know if needs arise.  

## 2018-11-26 NOTE — ED Provider Notes (Signed)
Patient seen by Dr. Viviano Simas, will be discharged with naltrexone and additional medication.  Patient not under IVC.  Patient discharged after psych consult.  Vitals:   11/26/18 1015 11/26/18 1643  BP: 134/78 124/85  Pulse: 88 85  Resp: 18 17  Temp: 97.9 F (36.6 C) 98.1 F (36.7 C)  SpO2: 98% 99%      Sharyn Creamer, MD 11/26/18 1816

## 2018-11-26 NOTE — ED Notes (Signed)
Update provided - pt reports taking home meds  - pharmacy to update med list Assessment completed  Continue to monitor

## 2018-11-26 NOTE — ED Notes (Signed)
ED BHU PLACEMENT JUSTIFICATION Is the patient under IVC or is there intent for IVC: Yes.   Is the patient medically cleared: Yes.   Is there vacancy in the ED BHU: Yes.   Is the population mix appropriate for patient: Yes.   Is the patient awaiting placement in inpatient or outpatient setting:  Has the patient had a psychiatric consult: Yes.   Survey of unit performed for contraband, proper placement and condition of furniture, tampering with fixtures in bathroom, shower, and each patient room: Yes.  ; Findings:  APPEARANCE/BEHAVIOR Calm and cooperative NEURO ASSESSMENT Orientation: oriented x3  Denies pain Hallucinations: No.None noted (Hallucinations) denies  Speech: Normal Gait: normal RESPIRATORY ASSESSMENT Even  Unlabored respirations  CARDIOVASCULAR ASSESSMENT Pulses equal   regular rate  Skin warm and dry   GASTROINTESTINAL ASSESSMENT no GI complaint EXTREMITIES Full ROM  PLAN OF CARE Provide calm/safe environment. Vital signs assessed twice daily. ED BHU Assessment once each 12-hour shift. Collaborate with TTS daily or as condition indicates. Assure the ED provider has rounded once each shift. Provide and encourage hygiene. Provide redirection as needed. Assess for escalating behavior; address immediately and inform ED provider.  Assess family dynamic and appropriateness for visitation as needed: Yes.  ; If necessary, describe findings:  Educate the patient/family about BHU procedures/visitation: Yes.  ; If necessary, describe findings:

## 2018-11-26 NOTE — ED Notes (Signed)

## 2018-11-26 NOTE — ED Triage Notes (Signed)
Pt brought in by [employer] handcuffed.  Pt is IVC.  Pt admits to etoh use.  Pt reports drug use.  Pt denies SI or HI.  Pt cooperative.

## 2018-11-26 NOTE — ED Notes (Signed)
Pt. Transferred to BHU from ED to room 5 after screening for contraband. Report to include Situation, Background, Assessment and Recommendations from Hewan RN. Pt. Oriented to unit including Q15 minute rounds as well as the security cameras for their protection. Patient is alert and oriented, warm and dry in no acute distress. Patient denies SI, HI, and AVH. Pt. Encouraged to let me know if needs arise. 

## 2018-11-26 NOTE — Discharge Instructions (Signed)

## 2018-11-26 NOTE — Consult Note (Signed)
[facility] Face-to-Face Psychiatry Consult   Reason for Consult:  Intoxication Referring Physician:  Dr. Manson Passey Patient Identification: Andrew Buchanan MRN:  [id number] Principal Diagnosis: Alcohol intoxication delirium (HCC) Diagnosis:  Principal Problem:   Alcohol intoxication delirium (HCC) Active Problems:   Alcohol abuse   Bipolar disorder, unspecified (HCC)  Total Time spent with patient: 1 hour  Subjective: "My mom got upset with me because I was drinking at her house."  From initial HPI and intake: Andrew Buchanan is a 48 y.o. male patient presented to [facility] ED via [facility]. The patient states that he does not know why he was brought in to the hospital. The patient was very irate and refused to answer most questions. The patient was seen face-to-face by this provider; chart reviewed and consulted with Dr. Lucianne Muss on 11/26/2018. The patient should sleep off his intoxication and be reassessed in the morning. On evaluation Andrew Buchanan reports that he does not know why he is here. During his evaluation, the patient is alert and oriented x4, angry and un-cooperative, he is a little threatening during his assessment. The patient does not appear to be responding to internal or external stimuli. Neither is the patient presenting with any delusional thinking. The patient denies any suicidal, homicidal, or self-harm ideations. The patient is not presenting with any psychotic or paranoid behaviors. During an encounter with the patient, he was not able to answer questions appropriately.  Patient is reevaluated after providing urine drug screen.  Multiple attempts of collateral have been made without success.  Patient was noted however to be talking to his family on multiple occasions.  On evaluation patient exhibits hypomanic behavior.  He talks about recently completing an extended rehabilitation program in [place] ([facility]) that cost $350,000 a day.  He reports  that he checked again on January 25, 2018 and left on December 16, 2018.  He reports his sober date as January 26, 2018.  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  He also has a girlfriend who owns a very successful business.  Patient reports that he continues to spend up to $1000 a day, but does not worry about it because "I make over $90,000 a year."  Patient states that he has been following under the care of his PCP Dr. Lacie Scotts, who prescribed him a medication that made him sick when he drank alcohol.  He states that he does not want to take that again.  He would however like something that would help him have decrease cravings for alcohol.  He does endorse that he has shoulder pain and occasionally takes Percocet, but denies that he has relapsed on opiates.  Patient also reports that he believes that he can drink again.  And notes that he has been drinking alcohol 3 times a week approximately a pint a day and 2 airplane bottles.  Patient is aware that he should not be driving, and states he has a blower on his car.  To complicate matters patient has been using a sport supplement called assault.  These are high caffeine content substances, and patient has been using inappropriately.  He states the recommended dosages 1 scoop a day, and he notes that he will use 4 scoops at a time multiple times through the day and mix them with alcohol.  Patient does endorse difficulty sleeping.  He has rapid speech, and grandiosity.  Patient reports that he previously had  taken lithium, but did not like the way it made him feel.  He is open to medication for mood stabilization if it would help him with his sleep.  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  Risk to Self:  Denies Risk to Others:  Denies Prior Inpatient Therapy:  yes, for  substance abuse treatment Prior Outpatient Therapy:  Yes, not current  Past Medical History:  Past Medical History:  Diagnosis Date  . Alcohol abuse   . Anxiety   . Hypertension   . Pancreatitis    No past surgical history on file. Family History: None on file Family Psychiatric  History: None given Social History:  None given Social History   Substance and Sexual Activity  Alcohol Use Yes     Social History   Substance and Sexual Activity  Drug Use Not on file    Social History   Socioeconomic History  . Marital status: Single    Spouse name: Not on file  . Number of children: Not on file  . Years of education: Not on file  . Highest education level: Not on file  Occupational History  . Not on file  Social Needs  . Financial resource strain: Not on file  . Food insecurity:    Worry: Not on file    Inability: Not on file  . Transportation needs:    Medical: Not on file    Non-medical: Not on file  Tobacco Use  . Smoking status: Never Smoker  . Smokeless tobacco: Never Used  Substance and Sexual Activity  . Alcohol use: Yes  . Drug use: Not on file  . Sexual activity: Not on file  Lifestyle  . Physical activity:    Days per week: Not on file    Minutes per session: Not on file  . Stress: Not on file  Relationships  . Social connections:    Talks on phone: Not on file    Gets together: Not on file    Attends religious service: Not on file    Active member of club or organization: Not on file    Attends meetings of clubs or organizations: Not on file    Relationship status: Not on file  Other Topics Concern  . Not on file  Social History Narrative  . Not on file   Additional Social History: Patient reports he is living with his father with whom he is also employed.    Allergies:   Allergies  Allergen Reactions  . Antihistamines, Chlorpheniramine-Type Anaphylaxis    Labs:  Results for orders placed or performed during the hospital encounter  of 11/26/18 (from the past 48 hour(s))  Comprehensive metabolic panel     Status: Abnormal   Collection Time: 11/26/18  2:20 AM  Result Value Ref Range   Sodium 143 135 - 145 mmol/L   Potassium 3.5 3.5 - 5.1 mmol/L   Chloride 108 98 - 111 mmol/L   CO2 20 (L) 22 - 32 mmol/L   Glucose, Bld 117 (H) 70 - 99 mg/dL   BUN 13 6 - 20 mg/dL   Creatinine, Ser 1.611.59 (H) 0.61 - 1.24 mg/dL   Calcium 9.6 8.9 - 09.610.3 mg/dL   Total Protein 7.6 6.5 - 8.1 g/dL   Albumin 4.6 3.5 - 5.0 g/dL   AST 45 (H) 15 - 41 U/L   ALT 51 (H) 0 - 44 U/L   Alkaline Phosphatase 43 38 -  126 U/L   Total Bilirubin 0.7 0.3 - 1.2 mg/dL   GFR calc non Af Amer 51 (L) >60 mL/min   GFR calc Af Amer 59 (L) >60 mL/min   Anion gap 15 5 - 15    Comment: Performed at [facility], [address] [place], [place] [address]  Ethanol     Status: Abnormal   Collection Time: 11/26/18  2:20 AM  Result Value Ref Range   Alcohol, Ethyl (B) 218 (H) <10 mg/dL    Comment: (NOTE) Lowest detectable limit for serum alcohol is 10 mg/dL. For medical purposes only. Performed at [facility], [address] [place], [place] [address]   cbc     Status: None   Collection Time: 11/26/18  2:20 AM  Result Value Ref Range   WBC 8.7 4.0 - 10.5 K/uL   RBC 4.77 4.22 - 5.81 MIL/uL   Hemoglobin 16.0 13.0 - 17.0 g/dL   HCT 83.2 54.9 - 82.6 %   MCV 96.9 80.0 - 100.0 fL   MCH 33.5 26.0 - 34.0 pg   MCHC 34.6 30.0 - 36.0 g/dL   RDW 41.5 83.0 - 94.0 %   Platelets 192 150 - 400 K/uL   nRBC 0.0 0.0 - 0.2 %    Comment: Performed at [facility], [address] [place], [place] [address]    No current facility-administered medications for this encounter.    Current Outpatient Medications  Medication Sig Dispense Refill  . Cholecalciferol (VITAMIN D3) 1.25 MG (50000 UT) CAPS Take 50,000 Units by mouth every 7 (seven) days.    Marland Kitchen gabapentin (NEURONTIN) 300 MG capsule Take 600 mg by mouth 2 (two) times daily.    Marland Kitchen losartan  (COZAAR) 50 MG tablet Take 50 mg by mouth daily.    . metFORMIN (GLUCOPHAGE) 1000 MG tablet Take 1,000 mg by mouth 2 (two) times daily with a meal.    . pantoprazole (PROTONIX) 40 MG tablet Take 40 mg by mouth daily.    . pioglitazone (ACTOS) 30 MG tablet Take 30 mg by mouth daily.      Musculoskeletal: Strength & Muscle Tone: within normal limits Gait & Station: normal Patient leans: N/A  Psychiatric Specialty Exam: Physical Exam  Nursing note and vitals reviewed. Constitutional: He is oriented to person, place, and time. He appears well-developed and well-nourished.  HENT:  Head: Normocephalic and atraumatic.  Eyes: Conjunctivae and EOM are normal.  Neck: Normal range of motion.  Cardiovascular: Normal rate.  Respiratory: Effort normal. No respiratory distress.  Musculoskeletal: Normal range of motion.  Neurological: He is alert and oriented to person, place, and time. He has normal reflexes.  Skin: Skin is warm and dry.    Review of Systems  Constitutional: Negative.   HENT: Negative.   Eyes: Negative.   Respiratory: Negative.   Cardiovascular: Negative.   Gastrointestinal: Negative.   Genitourinary: Negative.   Musculoskeletal: Negative.   Neurological: Negative.  Negative for tremors.  Endo/Heme/Allergies: Negative.   Psychiatric/Behavioral: Positive for substance abuse (alcohol). Negative for depression, hallucinations, memory loss and suicidal ideas. The patient has insomnia. The patient is not nervous/anxious.     Blood pressure 96/71, pulse (!) 110, temperature 97.6 F (36.4 C), temperature source Oral, resp. rate 20, height 6' (1.829 m), weight 90.7 kg, SpO2 99 %.Body mass index is 27.12 kg/m.  General Appearance: Casual  Eye Contact:  Good  Speech:  Clear and Coherent and Normal Rate  Volume:  Normal  Mood:  Euthymic and Irritable  Affect:  Congruent and Inappropriate  Thought Process:  Goal Directed and Descriptions of Associations: Intact  Orientation:   Full (Time, Place, and Person)  Thought Content:  Logical, Tangential and Grandiose  Suicidal Thoughts:  No  Homicidal Thoughts:  No  Memory:  Immediate;   Poor  Judgement:  Poor  Insight:  Lacking  Psychomotor Activity:  Restlessness  Concentration:  Concentration: Good  Recall:  Poor  Fund of Knowledge:  Good  Language:  Good  Akathisia:  No  Handed:  Right  AIMS (if indicated):   0  Assets:  Desire for Improvement Financial Resources/Insurance Vocational/Educational  ADL's:  Intact  Cognition:  WNL  Sleep:   Decreased at home, slept well in hospital.     Treatment Plan Summary: Medication management and Offered prescriptions for patient to take naltrexone (patient believes this is what he was on before that caused a bad reaction); prescription for Depakote extended release 1000 mg at bedtime daily for mood stabilization and to enhance sedating effect.  Disposition: No evidence of imminent risk to self or others at present.   Patient does not meet criteria for psychiatric inpatient admission. Supportive therapy provided about ongoing stressors. Discussed crisis plan, support from social network, calling 911, coming to the Emergency Department, and calling Suicide Hotline. Patient is agreeable to group therapy through AA and NA groups.  He does not desire further residential treatment or IOP.   He was able to engage in safety planning including [facility] or contact emergency services if he feels unable to maintain his own safety or the safety of others. Pt had no further questions, comments, or concerns.      Mariel CraftSHEILA M Gladstone Rosas, MD 11/26/2018 9:55 AM

## 2019-04-08 ENCOUNTER — Telehealth: Payer: Self-pay

## 2019-04-08 ENCOUNTER — Other Ambulatory Visit: Payer: Self-pay

## 2019-04-08 ENCOUNTER — Ambulatory Visit: Payer: BLUE CROSS/BLUE SHIELD | Admitting: Physician Assistant

## 2019-04-08 DIAGNOSIS — Z113 Encounter for screening for infections with a predominantly sexual mode of transmission: Secondary | ICD-10-CM

## 2019-04-08 DIAGNOSIS — A539 Syphilis, unspecified: Secondary | ICD-10-CM

## 2019-04-08 MED ORDER — PENICILLIN G BENZATHINE 1200000 UNIT/2ML IM SUSP
2.4000 10*6.[IU] | Freq: Once | INTRAMUSCULAR | Status: AC
  Administered 2019-04-08 (×2): 1.2 10*6.[IU] via INTRAMUSCULAR

## 2019-04-08 NOTE — Progress Notes (Signed)
Provider orders completed. 

## 2019-04-08 NOTE — Progress Notes (Signed)
     STI clinic/screening visit  Subjective:  Andrew Buchanan is a 48 y.o. male being seen today for an STI screening visit. The patient reports they do have symptoms.  Patient has the following medical conditionshas Alcohol intoxication delirium ([place]); Alcohol abuse; and Bipolar disorder, unspecified ([place]) on their problem list.  Chief Complaint  Patient presents with  . SEXUALLY TRANSMITTED DISEASE    Patient reports he was seen by PCP on 04/06/2019 and called to day to be referred for tx for Syphilis. HPI Reports one partner for 1 year and has had a rash on his arms, legs, buttocks, and trunk for about 3 weeks.  States other testing at PCP office for HIV, GC, and Chlamydia was negative and declines retesting for those again today.  Requests that his blood be redrawn today to monitor his titer and treatment today.  See flowsheet for further details and programmatic requirements.    The following portions of the patient's history were reviewed and updated as appropriate: allergies, current medications, past family history, past medical history, past social history, past surgical history and problem list. Problem list updated.  Objective:  There were no vitals filed for this visit.  Physical Exam Constitutional:      Appearance: Normal appearance.  HENT:     Head: Normocephalic and atraumatic.  Skin:    General: Skin is warm and dry.     Findings: Rash present. No erythema.     Comments: Faded, hyper pigmented areas with slight scaling on arms and trunk.  Neurological:     Mental Status: He is alert and oriented to person, place, and time.  Psychiatric:        Mood and Affect: Mood normal.        Behavior: Behavior normal.    Patient declines any other exam today since had his exam 2 days ago at PCP office.   Assessment and Plan:  Andrew Buchanan is a 48 y.o. male presenting to the [facility] Department for STI screening  1. Screening examination  for venereal disease Will re-draw RPR with confirmatory testing today. No sex for 14 days and until after partner completes treatment RTC 1 week for possible treatment #2 after receiving titer. Rec condoms with all sex - Syphilis Serology, [facility] - penicillin g benzathine (BICILLIN LA) 1200000 UNIT/2ML injection 2.4 Million Units  2. 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  No future appointments.  Jerene Dilling, PA

## 2019-04-08 NOTE — Telephone Encounter (Signed)
TC with patient re: +RPR. Discussed syphilis and need for tx. Patient scheduled for 1pm today.  Patient would like to bring his girlfriend in for tx and testing also. Kristine Garbe with [facility] notified.

## 2019-04-09 ENCOUNTER — Encounter: Payer: Self-pay | Admitting: Physician Assistant

## 2019-05-19 ENCOUNTER — Other Ambulatory Visit: Payer: Self-pay

## 2019-05-19 ENCOUNTER — Ambulatory Visit: Payer: Self-pay | Admitting: Nurse Practitioner

## 2019-05-19 DIAGNOSIS — Z113 Encounter for screening for infections with a predominantly sexual mode of transmission: Secondary | ICD-10-CM

## 2019-05-19 NOTE — Progress Notes (Signed)
  STI clinic/screening visit  Subjective:  Andrew Buchanan is a 48 y.o. male being seen today for an STI screening visit. The patient reports they do have symptoms.  Patient has the following medical conditions:   Patient Active Problem List   Diagnosis Date Noted  . Alcohol intoxication delirium ([place]) 11/26/2018  . Alcohol abuse 11/26/2018  . Bipolar disorder, unspecified ([place]) 11/26/2018     Chief Complaint  Patient presents with  . SEXUALLY TRANSMITTED DISEASE    wants testing for syphilis and HIV    Client into clinic with ex-partner demanding STD testing - recently seen in another clinic and wants confirmation of + results - unsure of exactly what he is talking about due to intoxication from client stated - Vodka. Security had to be called several times and they stayed outside client room until visit completed.    Patient reports - generalized rash  See flowsheet for further details and programmatic requirements.    The following portions of the patient's history were reviewed and updated as appropriate: allergies, current medications, past medical history, past social history, past surgical history and problem list.  Objective:  There were no vitals filed for this visit.  Physical Exam Constitutional:      Appearance: Normal appearance.  HENT:     Head: Normocephalic and atraumatic.     Comments: No nits or hair loss    Mouth/Throat:     Mouth: Mucous membranes are moist.     Pharynx: Oropharynx is clear. No oropharyngeal exudate or posterior oropharyngeal erythema.  Pulmonary:     Effort: Pulmonary effort is normal.  Abdominal:     General: Abdomen is flat.     Palpations: Abdomen is soft. There is no hepatomegaly or mass.     Tenderness: There is no abdominal tenderness.  Genitourinary:    Pubic Area: No rash or pubic lice.      Epididymis:     Right: Normal.     Left: Normal.     Comments: Declines exam Lymphadenopathy:     Head:     Right side  of head: No preauricular or posterior auricular adenopathy.     Left side of head: No preauricular or posterior auricular adenopathy.     Cervical: No cervical adenopathy.     Upper Body:     Right upper body: No supraclavicular or axillary adenopathy.     Left upper body: No supraclavicular or axillary adenopathy.     Lower Body: No right inguinal adenopathy. No left inguinal adenopathy.  Skin:    General: Skin is warm and dry.     Findings: No rash (generalized rash).  Neurological:     Mental Status: He is alert.  Psychiatric:     Comments: Client very irritated and unable to sit and answer all questions - admits to drinking Vodka       Assessment and Plan:  Andrew Buchanan is a 48 y.o. male presenting to the [facility] Department for STI screening  1. Screening examination for STD (sexually transmitted disease) Await test results  - Syphilis Serology, [facility] - HIV [facility]  Client verbalizes understanding and is in agreement with plan of care    Return if symptoms worsen or fail to improve.  No future appointments.  Berniece Andreas, NP

## 2019-05-19 NOTE — Progress Notes (Signed)
In to clinic-reports was treated for Syphillis and rash has continued-reports spoke to [employer] worker and recommended he get evaluated; desires repeat testing today Debera Lat, RN

## 2019-05-25 ENCOUNTER — Telehealth: Payer: Self-pay

## 2019-05-25 NOTE — Telephone Encounter (Signed)
TC from patient. ID verified via password from previous visit. Patient requesting results from August visit. Informed HIV negative and RPR 1:16, a decrease from 1:64 in July 2020. Patient verbalized understanding Aileen Fass, RN

## 2020-05-18 ENCOUNTER — Encounter: Payer: Self-pay | Admitting: *Deleted

## 2020-05-18 ENCOUNTER — Other Ambulatory Visit: Payer: Self-pay

## 2020-05-18 ENCOUNTER — Inpatient Hospital Stay
Admission: EM | Admit: 2020-05-18 | Discharge: 2020-05-20 | DRG: 639 | Disposition: A | Discharge status: Home / Self Care | Payer: 59 | Attending: Internal Medicine | Admitting: Internal Medicine

## 2020-05-18 DIAGNOSIS — Z888 Allergy status to other drugs, medicaments and biological substances status: Secondary | ICD-10-CM

## 2020-05-18 DIAGNOSIS — Z91148 Patient's other noncompliance with medication regimen for other reason: Secondary | ICD-10-CM

## 2020-05-18 DIAGNOSIS — Z9114 Patient's other noncompliance with medication regimen: Secondary | ICD-10-CM

## 2020-05-18 DIAGNOSIS — F1010 Alcohol abuse, uncomplicated: Secondary | ICD-10-CM | POA: Diagnosis present

## 2020-05-18 DIAGNOSIS — Z9119 Patient's noncompliance with other medical treatment and regimen: Secondary | ICD-10-CM

## 2020-05-18 DIAGNOSIS — E861 Hypovolemia: Secondary | ICD-10-CM | POA: Diagnosis present

## 2020-05-18 DIAGNOSIS — Z87892 Personal history of anaphylaxis: Secondary | ICD-10-CM

## 2020-05-18 DIAGNOSIS — D696 Thrombocytopenia, unspecified: Secondary | ICD-10-CM | POA: Diagnosis present

## 2020-05-18 DIAGNOSIS — Z20822 Contact with and (suspected) exposure to covid-19: Secondary | ICD-10-CM | POA: Diagnosis present

## 2020-05-18 DIAGNOSIS — Z9111 Patient's noncompliance with dietary regimen: Secondary | ICD-10-CM

## 2020-05-18 DIAGNOSIS — R32 Unspecified urinary incontinence: Secondary | ICD-10-CM | POA: Diagnosis present

## 2020-05-18 DIAGNOSIS — Z7984 Long term (current) use of oral hypoglycemic drugs: Secondary | ICD-10-CM

## 2020-05-18 DIAGNOSIS — E1110 Type 2 diabetes mellitus with ketoacidosis without coma: Principal | ICD-10-CM | POA: Diagnosis present

## 2020-05-18 DIAGNOSIS — D649 Anemia, unspecified: Secondary | ICD-10-CM | POA: Diagnosis present

## 2020-05-18 DIAGNOSIS — Z79899 Other long term (current) drug therapy: Secondary | ICD-10-CM

## 2020-05-18 DIAGNOSIS — F1410 Cocaine abuse, uncomplicated: Secondary | ICD-10-CM | POA: Diagnosis present

## 2020-05-18 DIAGNOSIS — I10 Essential (primary) hypertension: Secondary | ICD-10-CM | POA: Diagnosis present

## 2020-05-18 HISTORY — DX: Type 2 diabetes mellitus without complications: E11.9

## 2020-05-18 LAB — URINALYSIS, COMPLETE (UACMP) WITH MICROSCOPIC
Bilirubin Urine: NEGATIVE
Glucose, UA: >=500 mg/dL — AB
Hgb urine dipstick: NEGATIVE
Ketones, ur: 5 mg/dL — AB
Leukocytes,Ua: NEGATIVE
Nitrite: NEGATIVE
Protein, ur: NEGATIVE mg/dL
Specific Gravity, Urine: 1.021 (ref 1.005–1.030)
Squamous Epithelial / HPF: NONE SEEN (ref 0–5)
pH: 8 (ref 5.0–8.0)

## 2020-05-18 LAB — BASIC METABOLIC PANEL
Anion gap: 18 — ABNORMAL HIGH (ref 5–15)
BUN: 17 mg/dL (ref 6–20)
CO2: 23 mmol/L (ref 22–32)
Calcium: 9.1 mg/dL (ref 8.9–10.3)
Chloride: 68 mmol/L — ABNORMAL LOW (ref 98–111)
Creatinine, Ser: 0.81 mg/dL (ref 0.61–1.24)
GFR calc Af Amer: >60 mL/min (ref >60)
GFR calc non Af Amer: >60 mL/min (ref >60)
Glucose, Bld: 955 mg/dL (ref 70–99)
Potassium: 4.7 mmol/L (ref 3.5–5.1)
Sodium: 109 mmol/L — CL (ref 135–145)

## 2020-05-18 LAB — GLUCOSE, CAPILLARY: Glucose-Capillary: >600 mg/dL (ref 70–99)

## 2020-05-18 MED ORDER — SODIUM CHLORIDE 0.9 % IV BOLUS
1000.0000 mL | Freq: Once | INTRAVENOUS | Status: AC
  Administered 2020-05-18: 1000 mL via INTRAVENOUS

## 2020-05-18 NOTE — ED Triage Notes (Signed)
Pt arrives via [facility] from home. Per their report the pt said about 5 hours ago the pt "legs went out" and his is unable to stand up. He has been incontinent. Detoxing 35 days from crack and etoh. En route 117hr, 98%, 117/82, cbg "reading high", etco2 23, RR 24.    Pt says that his legs gave out, he had stool incontinent and could not stand up. Onset of vomiting. States 40 days of detox. Says he drinks "2 gallons of milk and 2 gallons of ice cream everyday".  Has not checked his sugar in "50 days". Takes Metformin. Extremities strength is weak, but equal.

## 2020-05-18 NOTE — ED Notes (Signed)
Pt urinated in urinal

## 2020-05-19 ENCOUNTER — Encounter: Payer: Self-pay | Admitting: Internal Medicine

## 2020-05-19 DIAGNOSIS — Z79899 Other long term (current) drug therapy: Secondary | ICD-10-CM | POA: Diagnosis not present

## 2020-05-19 DIAGNOSIS — Z888 Allergy status to other drugs, medicaments and biological substances status: Secondary | ICD-10-CM | POA: Diagnosis not present

## 2020-05-19 DIAGNOSIS — Z9119 Patient's noncompliance with other medical treatment and regimen: Secondary | ICD-10-CM | POA: Diagnosis not present

## 2020-05-19 DIAGNOSIS — Z20822 Contact with and (suspected) exposure to covid-19: Secondary | ICD-10-CM | POA: Diagnosis present

## 2020-05-19 DIAGNOSIS — F1010 Alcohol abuse, uncomplicated: Secondary | ICD-10-CM | POA: Diagnosis present

## 2020-05-19 DIAGNOSIS — F1410 Cocaine abuse, uncomplicated: Secondary | ICD-10-CM | POA: Diagnosis present

## 2020-05-19 DIAGNOSIS — Z7984 Long term (current) use of oral hypoglycemic drugs: Secondary | ICD-10-CM | POA: Diagnosis not present

## 2020-05-19 DIAGNOSIS — E1110 Type 2 diabetes mellitus with ketoacidosis without coma: Secondary | ICD-10-CM | POA: Diagnosis present

## 2020-05-19 DIAGNOSIS — Z91148 Patient's other noncompliance with medication regimen for other reason: Secondary | ICD-10-CM

## 2020-05-19 DIAGNOSIS — Z87892 Personal history of anaphylaxis: Secondary | ICD-10-CM | POA: Diagnosis not present

## 2020-05-19 DIAGNOSIS — Z9111 Patient's noncompliance with dietary regimen: Secondary | ICD-10-CM | POA: Diagnosis not present

## 2020-05-19 DIAGNOSIS — Z9114 Patient's other noncompliance with medication regimen: Secondary | ICD-10-CM

## 2020-05-19 DIAGNOSIS — I10 Essential (primary) hypertension: Secondary | ICD-10-CM | POA: Diagnosis present

## 2020-05-19 DIAGNOSIS — D649 Anemia, unspecified: Secondary | ICD-10-CM | POA: Diagnosis present

## 2020-05-19 DIAGNOSIS — D696 Thrombocytopenia, unspecified: Secondary | ICD-10-CM | POA: Diagnosis present

## 2020-05-19 DIAGNOSIS — R32 Unspecified urinary incontinence: Secondary | ICD-10-CM | POA: Diagnosis present

## 2020-05-19 DIAGNOSIS — E861 Hypovolemia: Secondary | ICD-10-CM | POA: Diagnosis present

## 2020-05-19 HISTORY — DX: Type 2 diabetes mellitus with ketoacidosis without coma: E11.10

## 2020-05-19 LAB — GLUCOSE, CAPILLARY
Glucose-Capillary: 105 mg/dL — ABNORMAL HIGH (ref 70–99)
Glucose-Capillary: 129 mg/dL — ABNORMAL HIGH (ref 70–99)
Glucose-Capillary: 138 mg/dL — ABNORMAL HIGH (ref 70–99)
Glucose-Capillary: 216 mg/dL — ABNORMAL HIGH (ref 70–99)
Glucose-Capillary: 224 mg/dL — ABNORMAL HIGH (ref 70–99)
Glucose-Capillary: 258 mg/dL — ABNORMAL HIGH (ref 70–99)
Glucose-Capillary: 283 mg/dL — ABNORMAL HIGH (ref 70–99)
Glucose-Capillary: 284 mg/dL — ABNORMAL HIGH (ref 70–99)
Glucose-Capillary: 298 mg/dL — ABNORMAL HIGH (ref 70–99)
Glucose-Capillary: 300 mg/dL — ABNORMAL HIGH (ref 70–99)
Glucose-Capillary: 310 mg/dL — ABNORMAL HIGH (ref 70–99)
Glucose-Capillary: 311 mg/dL — ABNORMAL HIGH (ref 70–99)
Glucose-Capillary: 319 mg/dL — ABNORMAL HIGH (ref 70–99)
Glucose-Capillary: 356 mg/dL — ABNORMAL HIGH (ref 70–99)
Glucose-Capillary: 360 mg/dL — ABNORMAL HIGH (ref 70–99)
Glucose-Capillary: 548 mg/dL (ref 70–99)
Glucose-Capillary: >600 mg/dL (ref 70–99)
Glucose-Capillary: >600 mg/dL (ref 70–99)

## 2020-05-19 LAB — URINE DRUG SCREEN, QUALITATIVE (ARMC ONLY)
Amphetamines, Ur Screen: NOT DETECTED
Barbiturates, Ur Screen: NOT DETECTED
Benzodiazepine, Ur Scrn: NOT DETECTED
Cannabinoid 50 Ng, Ur ~~LOC~~: NOT DETECTED
Cocaine Metabolite,Ur ~~LOC~~: NOT DETECTED
MDMA (Ecstasy)Ur Screen: NOT DETECTED
Methadone Scn, Ur: NOT DETECTED
Opiate, Ur Screen: NOT DETECTED
Phencyclidine (PCP) Ur S: NOT DETECTED
Tricyclic, Ur Screen: NOT DETECTED

## 2020-05-19 LAB — CBC
Hemoglobin: 12.8 g/dL — ABNORMAL LOW (ref 13.0–17.0)
Hemoglobin: 11.7 g/dL — ABNORMAL LOW (ref 13.0–17.0)
Platelets: 200 10*3/uL (ref 150–400)
Platelets: 147 10*3/uL — ABNORMAL LOW (ref 150–400)
WBC: 14 10*3/uL — ABNORMAL HIGH (ref 4.0–10.5)
WBC: 14.3 10*3/uL — ABNORMAL HIGH (ref 4.0–10.5)
nRBC: 0 % (ref 0.0–0.2)
nRBC: 0 % (ref 0.0–0.2)

## 2020-05-19 LAB — URINALYSIS, ROUTINE W REFLEX MICROSCOPIC
Bilirubin Urine: NEGATIVE
Glucose, UA: >=500 mg/dL — AB
Hgb urine dipstick: NEGATIVE
Ketones, ur: 20 mg/dL — AB
Leukocytes,Ua: NEGATIVE
Nitrite: NEGATIVE
Protein, ur: NEGATIVE mg/dL
Specific Gravity, Urine: 1.016 (ref 1.005–1.030)
pH: 6 (ref 5.0–8.0)

## 2020-05-19 LAB — BASIC METABOLIC PANEL
Anion gap: 11 (ref 5–15)
Anion gap: 10 (ref 5–15)
Anion gap: 8 (ref 5–15)
Anion gap: 10 (ref 5–15)
BUN: 10 mg/dL (ref 6–20)
BUN: 7 mg/dL (ref 6–20)
BUN: 5 mg/dL — ABNORMAL LOW (ref 6–20)
BUN: 6 mg/dL (ref 6–20)
CO2: 25 mmol/L (ref 22–32)
CO2: 24 mmol/L (ref 22–32)
CO2: 24 mmol/L (ref 22–32)
CO2: 24 mmol/L (ref 22–32)
Calcium: 8.1 mg/dL — ABNORMAL LOW (ref 8.9–10.3)
Calcium: 7.9 mg/dL — ABNORMAL LOW (ref 8.9–10.3)
Calcium: 7.8 mg/dL — ABNORMAL LOW (ref 8.9–10.3)
Calcium: 8 mg/dL — ABNORMAL LOW (ref 8.9–10.3)
Chloride: 92 mmol/L — ABNORMAL LOW (ref 98–111)
Chloride: 93 mmol/L — ABNORMAL LOW (ref 98–111)
Chloride: 97 mmol/L — ABNORMAL LOW (ref 98–111)
Chloride: 94 mmol/L — ABNORMAL LOW (ref 98–111)
Creatinine, Ser: 0.53 mg/dL — ABNORMAL LOW (ref 0.61–1.24)
Creatinine, Ser: 0.54 mg/dL — ABNORMAL LOW (ref 0.61–1.24)
Creatinine, Ser: 0.53 mg/dL — ABNORMAL LOW (ref 0.61–1.24)
Creatinine, Ser: 0.61 mg/dL (ref 0.61–1.24)
GFR calc Af Amer: >60 mL/min (ref >60)
GFR calc Af Amer: >60 mL/min (ref >60)
GFR calc Af Amer: >60 mL/min (ref >60)
GFR calc Af Amer: >60 mL/min (ref >60)
GFR calc non Af Amer: >60 mL/min (ref >60)
GFR calc non Af Amer: >60 mL/min (ref >60)
GFR calc non Af Amer: >60 mL/min (ref >60)
GFR calc non Af Amer: >60 mL/min (ref >60)
Glucose, Bld: 258 mg/dL — ABNORMAL HIGH (ref 70–99)
Glucose, Bld: 316 mg/dL — ABNORMAL HIGH (ref 70–99)
Glucose, Bld: 442 mg/dL — ABNORMAL HIGH (ref 70–99)
Glucose, Bld: 244 mg/dL — ABNORMAL HIGH (ref 70–99)
Potassium: 3.6 mmol/L (ref 3.5–5.1)
Potassium: 3.1 mmol/L — ABNORMAL LOW (ref 3.5–5.1)
Potassium: 3 mmol/L — ABNORMAL LOW (ref 3.5–5.1)
Potassium: 3.7 mmol/L (ref 3.5–5.1)
Sodium: 128 mmol/L — ABNORMAL LOW (ref 135–145)
Sodium: 127 mmol/L — ABNORMAL LOW (ref 135–145)
Sodium: 129 mmol/L — ABNORMAL LOW (ref 135–145)
Sodium: 128 mmol/L — ABNORMAL LOW (ref 135–145)

## 2020-05-19 LAB — SARS CORONAVIRUS 2 BY RT PCR (HOSPITAL ORDER, PERFORMED IN ~~LOC~~ HOSPITAL LAB): SARS Coronavirus 2: NEGATIVE

## 2020-05-19 LAB — BLOOD GAS, VENOUS
Acid-Base Excess: 2.6 mmol/L — ABNORMAL HIGH (ref 0.0–2.0)
Bicarbonate: 28.4 mmol/L — ABNORMAL HIGH (ref 20.0–28.0)
O2 Saturation: 25.6 %
Patient temperature: 37
pCO2, Ven: 48 mmHg (ref 44.0–60.0)
pH, Ven: 7.38 (ref 7.250–7.430)
pO2, Ven: <31 mmHg — CL (ref 32.0–45.0)

## 2020-05-19 LAB — BETA-HYDROXYBUTYRIC ACID
Beta-Hydroxybutyric Acid: 1.79 mmol/L — ABNORMAL HIGH (ref 0.05–0.27)
Beta-Hydroxybutyric Acid: 0.43 mmol/L — ABNORMAL HIGH (ref 0.05–0.27)
Beta-Hydroxybutyric Acid: 0.14 mmol/L (ref 0.05–0.27)

## 2020-05-19 LAB — MAGNESIUM
Magnesium: 1.6 mg/dL — ABNORMAL LOW (ref 1.7–2.4)
Magnesium: 2 mg/dL (ref 1.7–2.4)
Magnesium: 1.8 mg/dL (ref 1.7–2.4)
Magnesium: 2.1 mg/dL (ref 1.7–2.4)

## 2020-05-19 LAB — PHOSPHORUS
Phosphorus: 2.2 mg/dL — ABNORMAL LOW (ref 2.5–4.6)
Phosphorus: 2.8 mg/dL (ref 2.5–4.6)
Phosphorus: 1.9 mg/dL — ABNORMAL LOW (ref 2.5–4.6)
Phosphorus: 1.6 mg/dL — ABNORMAL LOW (ref 2.5–4.6)

## 2020-05-19 MED ORDER — INSULIN GLARGINE 100 UNIT/ML ~~LOC~~ SOLN
26.0000 [IU] | Freq: Every day | SUBCUTANEOUS | Status: DC
  Administered 2020-05-19 – 2020-05-20 (×2): 26 [IU] via SUBCUTANEOUS
  Filled 2020-05-19 (×2): qty 0.26

## 2020-05-19 MED ORDER — POTASSIUM CHLORIDE 10 MEQ/100ML IV SOLN
10.0000 meq | INTRAVENOUS | Status: DC
  Administered 2020-05-19: 10 meq via INTRAVENOUS
  Filled 2020-05-19: qty 100

## 2020-05-19 MED ORDER — LACTATED RINGERS IV SOLN: INTRAVENOUS | Status: DC

## 2020-05-19 MED ORDER — ONDANSETRON HCL 4 MG/2ML IJ SOLN: 4.0000 mg | Freq: Four times a day (QID) | INTRAMUSCULAR | Status: DC | PRN

## 2020-05-19 MED ORDER — ENOXAPARIN SODIUM 40 MG/0.4ML ~~LOC~~ SOLN
40.0000 mg | SUBCUTANEOUS | Status: DC
  Administered 2020-05-19 (×2): 40 mg via SUBCUTANEOUS
  Filled 2020-05-19: qty 0.4

## 2020-05-19 MED ORDER — FOLIC ACID 1 MG PO TABS
1.0000 mg | ORAL_TABLET | Freq: Every day | ORAL | Status: DC
  Administered 2020-05-19 – 2020-05-20 (×2): 1 mg via ORAL
  Filled 2020-05-19 (×2): qty 1

## 2020-05-19 MED ORDER — DIVALPROEX SODIUM ER 500 MG PO TB24
1000.0000 mg | ORAL_TABLET | Freq: Every day | ORAL | Status: DC
  Administered 2020-05-19: 1000 mg via ORAL
  Filled 2020-05-19 (×2): qty 2

## 2020-05-19 MED ORDER — INSULIN REGULAR(HUMAN) IN NACL 100-0.9 UT/100ML-% IV SOLN
INTRAVENOUS | Status: DC
  Administered 2020-05-19: 19 [IU]/h via INTRAVENOUS
  Administered 2020-05-19: 8.5 [IU]/h via INTRAVENOUS
  Administered 2020-05-19: 19 [IU]/h via INTRAVENOUS
  Filled 2020-05-19 (×2): qty 100

## 2020-05-19 MED ORDER — SODIUM PHOSPHATES 45 MMOLE/15ML IV SOLN
20.0000 mmol | Freq: Once | INTRAVENOUS | Status: AC
  Administered 2020-05-19: 20 mmol via INTRAVENOUS
  Filled 2020-05-19: qty 6.67

## 2020-05-19 MED ORDER — POTASSIUM CHLORIDE 10 MEQ/100ML IV SOLN
10.0000 meq | INTRAVENOUS | Status: DC
  Administered 2020-05-19 (×2): 10 meq via INTRAVENOUS
  Filled 2020-05-19 (×2): qty 100

## 2020-05-19 MED ORDER — SODIUM CHLORIDE 0.9 % IV BOLUS
1000.0000 mL | INTRAVENOUS | Status: AC
  Administered 2020-05-19 (×2): 1000 mL via INTRAVENOUS

## 2020-05-19 MED ORDER — DEXTROSE 50 % IV SOLN: 0.0000 mL | INTRAVENOUS | Status: DC | PRN

## 2020-05-19 MED ORDER — POLYETHYLENE GLYCOL 3350 17 G PO PACK: 17.0000 g | PACK | Freq: Every day | ORAL | Status: DC | PRN

## 2020-05-19 MED ORDER — POTASSIUM CHLORIDE 10 MEQ/100ML IV SOLN
10.0000 meq | INTRAVENOUS | Status: AC
  Administered 2020-05-19 (×2): 10 meq via INTRAVENOUS
  Filled 2020-05-19: qty 100

## 2020-05-19 MED ORDER — ACETAMINOPHEN 325 MG PO TABS
650.0000 mg | ORAL_TABLET | ORAL | Status: DC | PRN
  Administered 2020-05-20 (×2): 650 mg via ORAL
  Filled 2020-05-19 (×2): qty 2

## 2020-05-19 MED ORDER — ADULT MULTIVITAMIN W/MINERALS CH
1.0000 | ORAL_TABLET | Freq: Every day | ORAL | Status: DC
  Administered 2020-05-19 – 2020-05-20 (×2): 1 via ORAL
  Filled 2020-05-19 (×2): qty 1

## 2020-05-19 MED ORDER — GABAPENTIN 300 MG PO CAPS
600.0000 mg | ORAL_CAPSULE | Freq: Two times a day (BID) | ORAL | Status: DC
  Administered 2020-05-19 – 2020-05-20 (×3): 600 mg via ORAL
  Filled 2020-05-19 (×3): qty 2

## 2020-05-19 MED ORDER — INSULIN ASPART 100 UNIT/ML ~~LOC~~ SOLN
0.0000 [IU] | Freq: Three times a day (TID) | SUBCUTANEOUS | Status: DC
  Administered 2020-05-19 – 2020-05-20 (×2): 5 [IU] via SUBCUTANEOUS
  Administered 2020-05-20: 11 [IU] via SUBCUTANEOUS
  Filled 2020-05-19 (×3): qty 1

## 2020-05-19 MED ORDER — POTASSIUM CHLORIDE CRYS ER 20 MEQ PO TBCR
20.0000 meq | EXTENDED_RELEASE_TABLET | Freq: Once | ORAL | Status: AC
  Administered 2020-05-19: 20 meq via ORAL
  Filled 2020-05-19: qty 1

## 2020-05-19 MED ORDER — INSULIN ASPART 100 UNIT/ML ~~LOC~~ SOLN
0.0000 [IU] | Freq: Every day | SUBCUTANEOUS | Status: DC
  Administered 2020-05-19: 4 [IU] via SUBCUTANEOUS
  Filled 2020-05-19: qty 1

## 2020-05-19 MED ORDER — MAGNESIUM SULFATE 4 GM/100ML IV SOLN
4.0000 g | Freq: Once | INTRAVENOUS | Status: AC
  Administered 2020-05-19: 4 g via INTRAVENOUS
  Filled 2020-05-19: qty 100

## 2020-05-19 MED ORDER — TRAZODONE HCL 50 MG PO TABS
50.0000 mg | ORAL_TABLET | Freq: Every evening | ORAL | Status: DC | PRN
  Administered 2020-05-19: 50 mg via ORAL
  Filled 2020-05-19: qty 1

## 2020-05-19 MED ORDER — THIAMINE HCL 100 MG PO TABS
100.0000 mg | ORAL_TABLET | Freq: Every day | ORAL | Status: DC
  Administered 2020-05-19 – 2020-05-20 (×2): 100 mg via ORAL
  Filled 2020-05-19 (×2): qty 1

## 2020-05-19 MED ORDER — DOCUSATE SODIUM 100 MG PO CAPS: 100.0000 mg | ORAL_CAPSULE | Freq: Two times a day (BID) | ORAL | Status: DC | PRN

## 2020-05-19 MED ORDER — POTASSIUM CHLORIDE 10 MEQ/100ML IV SOLN
10.0000 meq | INTRAVENOUS | Status: AC
  Administered 2020-05-19 (×2): 10 meq via INTRAVENOUS
  Filled 2020-05-19 (×2): qty 100

## 2020-05-19 MED ORDER — LIVING WELL WITH DIABETES BOOK
Freq: Once | Status: AC
  Filled 2020-05-19: qty 1

## 2020-05-19 MED ORDER — DEXTROSE IN LACTATED RINGERS 5 % IV SOLN: INTRAVENOUS | Status: DC

## 2020-05-19 NOTE — ED Notes (Signed)
pts girlfriend: Mickie Bail [phone] cell; [phone] home; pt asked this rn to record her number.

## 2020-05-19 NOTE — ED Notes (Signed)
Pharmacy messaged to readjust IV potassium times.

## 2020-05-19 NOTE — ED Notes (Signed)
Meal given per Jeri Modena NP. Pt tolerated well.

## 2020-05-19 NOTE — Progress Notes (Signed)
Pharmacy Electrolyte Monitoring Consult:  Pharmacy consulted to assist in monitoring and replacing electrolytes in this 49 y.o. male admitted on 05/18/2020 with Hyperglycemia   Labs:  Sodium (mmol/L)  Date Value  05/19/2020 128 (L)  08/18/2014 142   Potassium (mmol/L)  Date Value  05/19/2020 3.6  08/18/2014 3.8   Magnesium (mg/dL)  Date Value  61/22/4497 1.6 (L)   Phosphorus (mg/dL)  Date Value  53/00/5110 2.2 (L)   Calcium (mg/dL)  Date Value  21/08/7355 8.1 (L)   Calcium, Total (mg/dL)  Date Value  70/14/1030 8.4 (L)   Albumin (g/dL)  Date Value  13/14/3888 4.6  08/18/2014 3.9    Assessment/Plan: Patient w/ hypomagnesemia being replaced w/ mag 4g IV x 1, Na of 128, Phos of 2.2 being replaced w/ NaPhos 20 mmol IV x 1 over 6 hours and will continue to monitor and replace as needed.  Thomasene Ripple, PharmD, BCPS Clinical Pharmacist 05/19/2020 7:12 AM

## 2020-05-19 NOTE — ED Notes (Signed)
Attempted top call

## 2020-05-19 NOTE — ED Notes (Signed)
Pt resting in bed with only complaint of his feet being cold. Socks supplied and placed on pt. Appears in no distress, states he is tired but no other complaints. Call light in reach, bed locked and low.

## 2020-05-19 NOTE — ED Triage Notes (Signed)
First Nurse Note:  ARrives from [facility]. Was playing basketball and the backboard fell down on top of his head.  No LOC.  AAOx3.  Skin warm and dry. NAD

## 2020-05-19 NOTE — H&P (Addendum)
Name: Andrew Buchanan MRN: [id number] DOB: [birth year]    ADMISSION DATE:  05/18/2020 CONSULTATION DATE: 05/19/2020  REFERRING MD : Dr. Manson Passey   CHIEF COMPLAINT: Hyperglycemia   BRIEF PATIENT DESCRIPTION: 49 yo male admitted with DKA secondary to medication noncompliance requiring insulin gtt   SIGNIFICANT EVENTS/STUDIES:  08/12: Pt admitted to ICU with DKA, however remain in the ER pending bed availability  HISTORY OF PRESENT ILLNESS:   This is a 49 yo male with a PMH of Type II Diabetes Mellitus, HTN, Pancreatitis, Anxiety, Polysubstance Abuse (Crack Cocaine), and ETOH Abuse.  He presented to [facility] ER on 08/11 with generalized weakness and incontinence.  He states he has been detoxing from crack cocaine and alcohol for the past 40 days.  He states since he started detoxing he has craved sweets, therefore he eats 2 gallons of ice cream daily and drinks 2 gallons of milk daily.  He also states he has been noncompliant with his diabetes medications.  ER lab results ruled pt in for DKA, therefore insulin gtt initiated.  Pt COVID-19 negative.  PCCM team contacted for ICU admission.   PAST MEDICAL HISTORY :   has a past medical history of Alcohol abuse, Anxiety, Diabetes mellitus without complication (HCC), Hypertension, and Pancreatitis.  has no past surgical history on file. Prior to Admission medications   Medication Sig Start Date End Date Taking? Authorizing Provider  Cholecalciferol (VITAMIN D3) 1.25 MG (50000 UT) CAPS Take 50,000 Units by mouth every 7 (seven) days.   Yes [provider]  gabapentin (NEURONTIN) 300 MG capsule Take 600 mg by mouth 2 (two) times daily.   Yes [provider]  losartan (COZAAR) 50 MG tablet Take 50 mg by mouth daily.   Yes [provider]  metFORMIN (GLUCOPHAGE) 1000 MG tablet Take 1,000 mg by mouth 2 (two) times daily with a meal.   Yes [provider]  divalproex (DEPAKOTE ER) 500 MG 24 hr tablet Take 2 tablets  (1,000 mg total) by mouth at bedtime. Patient not taking: Reported on 05/19/2020 11/26/18   Mariel Craft, MD  naltrexone (DEPADE) 50 MG tablet Take 1 tablet (50 mg total) by mouth daily. Patient not taking: Reported on 05/19/2020 11/27/18   Mariel Craft, MD  pantoprazole (PROTONIX) 40 MG tablet Take 40 mg by mouth daily. Patient not taking: Reported on 05/19/2020    [provider]  pioglitazone (ACTOS) 30 MG tablet Take 30 mg by mouth daily. Patient not taking: Reported on 05/19/2020    [provider]   Allergies  Allergen Reactions  . Antihistamines, Chlorpheniramine-Type Anaphylaxis    FAMILY HISTORY:  family history is not on file. SOCIAL HISTORY:  reports that he has never smoked. He has never used smokeless tobacco. He reports current alcohol use.  REVIEW OF SYSTEMS: Positives in BOLD   Constitutional: Negative for fever, chills, weight loss, malaise/fatigue and diaphoresis.  HENT: Negative for hearing loss, ear pain, nosebleeds, congestion, sore throat, neck pain, tinnitus and ear discharge.   Eyes: Negative for blurred vision, double vision, photophobia, pain, discharge and redness.  Respiratory: Negative for cough, hemoptysis, sputum production, shortness of breath, wheezing and stridor.   Cardiovascular: Negative for chest pain, palpitations, orthopnea, claudication, leg swelling and PND.  Gastrointestinal: Negative for heartburn, nausea, vomiting, abdominal pain, diarrhea, constipation, blood in stool and melena.  Genitourinary: incontinence, dysuria, urgency, frequency, hematuria and flank pain.  Musculoskeletal: Negative for myalgias, back pain, joint pain and falls.  Skin: Negative for itching  and rash.  Neurological: dizziness, tingling, tremors, sensory change, speech change, focal weakness, seizures, loss of consciousness, weakness and headaches.  Endo/Heme/Allergies: Negative for environmental allergies and polydipsia. Does not bruise/bleed  easily.  SUBJECTIVE:  No complaints at this time   VITAL SIGNS: Temp:  [97.9 F (36.6 C)] 97.9 F (36.6 C) (08/11 2230) Pulse Rate:  [109] 109 (08/11 2230) Resp:  [20] 20 (08/11 2230) BP: (124)/(89) 124/89 (08/11 2230) SpO2:  [95 %] 95 % (08/11 2230) Weight:  [87.6 kg] 87.6 kg (08/12 0257)  PHYSICAL EXAMINATION: General: well developed, well nourished male, NAD  Neuro: alert and oriented, follows commands  HEENT: supple, no JVD  Cardiovascular: nsr, rrr,no R/G  Lungs: clear throughout, even, non labored  Abdomen: +BS x4, soft, non tender, non distended  Musculoskeletal: normal bulk and tone, no edema  Skin: intact no rashes or lesions present   Recent Labs  Lab 05/18/20 2242  NA 109*  K 4.7  CL 68*  CO2 23  BUN 17  CREATININE 0.81  GLUCOSE 955*   Recent Labs  Lab 05/18/20 2242  HGB 12.8*  HCT RESULTS UNAVAILABLE DUE TO INTERFERING SUBSTANCE  WBC 14.0*  PLT 200   No results found.  ASSESSMENT / PLAN:  Diabetic ketoacidosis secondary to medication noncompliance  Hyponatremia secondary to hypovolemia in setting of DKA  Continue insulin gtt until anion gap closed and serum CO2 >20 BMP q4hrs and Beta-hydroxybutyric acid q8hrs while on insulin gtt  Replace electrolytes as indicated  CBG's per endotool recommendations  IV fluids per DKA protocol  Diabetes coordinator consulted appreciate input Educated pt regarding importance of medication compliance  Keep NPO for now   Anemia without obvious acute blood loss  VTE px: subq lovenox Trend CBC Monitor for s/sx of bleeding and transfuse for hgb <7  Polysubstance abuse and ETOH Abuse Hx Will start folic acid, thiamine, and mvi   Sonda Rumble, AGNP  Pulmonary/Critical Care Pager [phone] (please enter 7 digits) PCCM Consult Pager [phone] (please enter 7 digits)   Patient was examined and I agree with the documented -Diabetic ketoacidosis.  (Improved) anion gap closed -Pseudohyponatremia with  hyperglycemia.  (Improved) -Hypophosphatemia and hypomagnesemia.  Replete and monitor electrolytes -Thrombocytopenia.  Platelet 147 supportive care  Critical care time 35 min

## 2020-05-19 NOTE — Progress Notes (Addendum)
Inpatient Diabetes Program Recommendations  AACE/ADA: New Consensus Statement on Inpatient Glycemic Control   Target Ranges:  Prepandial:   less than 140 mg/dL      Peak postprandial:   less than 180 mg/dL (1-2 hours)      Critically ill patients:  140 - 180 mg/dL   Results for NAZIAH, WECKERLY (MRN [id number]) as of 05/19/2020 09:01  Ref. Range 05/19/2020 00:49 05/19/2020 01:21 05/19/2020 01:58 05/19/2020 02:32 05/19/2020 03:53 05/19/2020 04:50 05/19/2020 05:56 05/19/2020 07:33  Glucose-Capillary Latest Ref Range: 70 - 99 mg/dL >403 (HH) >474 (HH) 259 (HH) 360 (H) 258 (H) 310 (H) 300 (H) 298 (H)  Results for HARDIE, VELTRE (MRN [id number]) as of 05/19/2020 09:01  Ref. Range 05/18/2020 22:42  Beta-Hydroxybutyric Acid Latest Ref Range: 0.05 - 0.27 mmol/L 1.79 (H)  Results for PRADEEP, BEAUBRUN (MRN [id number]) as of 05/19/2020 09:01  Ref. Range 05/18/2020 22:42  CO2 Latest Ref Range: 22 - 32 mmol/L 23  Glucose Latest Ref Range: 70 - 99 mg/dL 660 (HH)  Anion gap Latest Ref Range: 5 - 15  18 (H)   Review of Glycemic Control  Diabetes history: DM2 Outpatient Diabetes medications: Metformin 1000 mg BID, Actos 30 mg daily (not taking Actos per med rec) Current orders for Inpatient glycemic control: IV insulin  Inpatient Diabetes Program Recommendations:    HbgA1C: Please consider ordering an A1C to evaluate glycemic control over the past 2-3 months.  NOTE: Noted consult. Chart reviewed. Noted patient being admitted with DKA, initial glucose 955 mg/dl and ordered IV insulin. Per H&P, patient "has been detoxing from crack cocaine and alcohol for the past 40 days.  He states since he started detoxing he has craved sweets, therefore he eats 2 gallons of ice cream daily and drinks 2 gallons of milk daily." No insurance or PCP listed. Ordered RD consult for diet education and TOC consult for assistance with follow up and medications. Will plan to talk with patient.  Addendum  05/19/20@14 :25-Spoke with patient regarding DM. Patient is very upset about not having a diet ordered and stated repeatedly that he was hungry and we were going to let him eat or he would leave AMA. Provided emotional support and informed patient I would ask provider about getting him a diet after our conversation. Discussed DKA and hospital protocol to safely bring glucose down with IV insulin and then transition to SQ insulin.  Patient states that he has had DM for at least 10 years and he was taking Metformin for DM control. Patient states that he was prescribed another pill for DM in the past but he stopped taking it. Patient states that he has been detoxing off alcohol and crack cocaine for past month or so. Patient states he knows he has messed up, he was drinking and doing drugs and he did not care about anything else. He notes that since he has been detoxing from alcohol and crack cocaine he has been starving and eating 2 containers of ice cream and drinking 2 gallons of milk a day. Patient then again became fixated on eating and stating he was angry and he was use to getting what he wanted and he wanted food. Patient visibly upset and cussing. Again tried to calm patient to continue conversation but patient stated that I needed to go now and get the doctor to order him some food or he was going to leave AMA. Informed patient that one of my coworkers would follow up with him tomorrow and I  would go ask provider about allowing him to eat.  Sent communication to Dr. Duanne Limerick and RN regarding patient wanting to eat. Will have diabetes coordinator follow up with patient again tomorrow.  Thanks, Orlando Penner, RN, MSN, CDE Diabetes Coordinator Inpatient Diabetes Program [phone] (Team Pager from 8am to 5pm)

## 2020-05-19 NOTE — ED Notes (Addendum)
Pt with L AC IV pulled out, IV insulin switched to R AC. Left AC infiltrated.

## 2020-05-19 NOTE — Progress Notes (Signed)
Pharmacy Electrolyte Monitoring Consult:  Pharmacy consulted to assist in monitoring and replacing electrolytes in this 49 y.o. male admitted on 05/18/2020 with Hyperglycemia  Labs:  Sodium (mmol/L)  Date Value  05/19/2020 127 (L)  08/18/2014 142   Potassium (mmol/L)  Date Value  05/19/2020 3.1 (L)  08/18/2014 3.8   Magnesium (mg/dL)  Date Value  58/59/2924 2.0   Phosphorus (mg/dL)  Date Value  46/28/6381 2.8   Calcium (mg/dL)  Date Value  77/08/6578 7.9 (L)   Calcium, Total (mg/dL)  Date Value  03/83/3383 8.4 (L)   Albumin (g/dL)  Date Value  29/19/1660 4.6  08/18/2014 3.9    Assessment/Plan: --K 3.1, patient remains on insulin infusion. Will order IV potassium 10 mEq x 6 --Mg 2.0, improved with replacement --Phos 2.8, improved with replacement --BMP, Mg, Phos are currently ordered q4h. Pharmacy will continue to monitor and replace as indicated  Tressie Ellis 05/19/2020 1:13 PM

## 2020-05-19 NOTE — ED Notes (Signed)
Pt ate some of breakfast, tolerated well.

## 2020-05-19 NOTE — ED Provider Notes (Signed)
[facility] Emergency Department Provider Note  ____________________________________________   First MD Initiated Contact with Patient 05/18/20 2357     (approximate)  I have reviewed the triage vital signs and the nursing notes.   HISTORY  Chief Complaint Hyperglycemia    HPI Andrew Buchanan is a 49 y.o. male with history of hypertension, diabetes, alcohol and crack cocaine abuse presents to the emergency department the EMS from home secondary to generalized weakness incontinence.  Patient states that he has been detoxing from crack cocaine and alcohol for the past 35 days.  Patient states that he has been drinking 2 gallons of milk and eating 2 gallons of ice cream every day "because I like it".  Patient denies any recent illness no fever no nausea or vomiting or diarrhea.  Patient denies any chest pain or shortness of breath.  Patient denies any abdominal pain       Past Medical History:  Diagnosis Date  . Alcohol abuse   . Anxiety   . Diabetes mellitus without complication (HCC)   . Hypertension   . Pancreatitis     Patient Active Problem List   Diagnosis Date Noted  . Alcohol intoxication delirium (HCC) 11/26/2018  . Alcohol abuse 11/26/2018  . Bipolar disorder, unspecified (HCC) 11/26/2018    History reviewed. No pertinent surgical history.  Prior to Admission medications   Medication Sig Start Date End Date Taking? Authorizing Provider  Cholecalciferol (VITAMIN D3) 1.25 MG (50000 UT) CAPS Take 50,000 Units by mouth every 7 (seven) days.    [provider]  divalproex (DEPAKOTE ER) 500 MG 24 hr tablet Take 2 tablets (1,000 mg total) by mouth at bedtime. 11/26/18   Mariel Craft, MD  gabapentin (NEURONTIN) 300 MG capsule Take 600 mg by mouth 2 (two) times daily.    [provider]  losartan (COZAAR) 50 MG tablet Take 50 mg by mouth daily.    [provider]  metFORMIN (GLUCOPHAGE) 1000 MG tablet Take  1,000 mg by mouth 2 (two) times daily with a meal.    [provider]  naltrexone (DEPADE) 50 MG tablet Take 1 tablet (50 mg total) by mouth daily. 11/27/18   Mariel Craft, MD  pantoprazole (PROTONIX) 40 MG tablet Take 40 mg by mouth daily.    [provider]  pioglitazone (ACTOS) 30 MG tablet Take 30 mg by mouth daily.    [provider]    Allergies Antihistamines, chlorpheniramine-type  No family history on file.  Social History Social History   Tobacco Use  . Smoking status: Never Smoker  . Smokeless tobacco: Never Used  Substance Use Topics  . Alcohol use: Yes  . Drug use: Not on file    Review of Systems Constitutional: No fever/chills Eyes: No visual changes. ENT: No sore throat. Cardiovascular: Denies chest pain. Respiratory: Denies shortness of breath. Gastrointestinal: No abdominal pain.  No nausea, no vomiting.  No diarrhea.  No constipation. Genitourinary: Negative for dysuria. Musculoskeletal: Negative for neck pain.  Negative for back pain. Integumentary: Negative for rash. Neurological: Negative for headaches, focal weakness or numbness.  Positive for generalized weakness Endocrine:  Positive for hyperglycemia   ____________________________________________   PHYSICAL EXAM:  VITAL SIGNS: ED Triage Vitals [05/18/20 2230]  Enc Vitals Group     BP 124/89     Pulse Rate (!) 109     Resp 20     Temp 97.9 F (36.6 C)     Temp Source Oral  SpO2 95 %     Weight      Height      Head Circumference      Peak Flow      Pain Score      Pain Loc      Pain Edu?      Excl. in GC?     Constitutional: Alert and oriented.  Eyes: Conjunctivae are normal.  Head: Atraumatic. Mouth/Throat: Dry oral mucosa. Neck: No stridor.  No meningeal signs.   Cardiovascular: Tachycardia, regular rhythm. Good peripheral circulation. Grossly normal heart sounds. Respiratory: Normal respiratory effort.  No retractions. Gastrointestinal:  Soft and nontender. No distention.  Musculoskeletal: No lower extremity tenderness nor edema. No gross deformities of extremities. Neurologic:  Normal speech and language. No gross focal neurologic deficits are appreciated.  Skin:  Skin is warm, dry and intact. Psychiatric: Mood and affect are normal. Speech and behavior are normal.  ____________________________________________   LABS (all labs ordered are listed, but only abnormal results are displayed)  Labs Reviewed  BASIC METABOLIC PANEL - Abnormal; Notable for the following components:      Result Value   Sodium 109 (*)    Chloride 68 (*)    Glucose, Bld 955 (*)    Anion gap 18 (*)    All other components within normal limits  CBC - Abnormal; Notable for the following components:   WBC 14.0 (*)    Hemoglobin 12.8 (*)    All other components within normal limits  URINALYSIS, COMPLETE (UACMP) WITH MICROSCOPIC - Abnormal; Notable for the following components:   Color, Urine STRAW (*)    APPearance CLEAR (*)    Glucose, UA >=500 (*)    Ketones, ur 5 (*)    Bacteria, UA RARE (*)    All other components within normal limits  GLUCOSE, CAPILLARY - Abnormal; Notable for the following components:   Glucose-Capillary >600 (*)    All other components within normal limits  BLOOD GAS, VENOUS - Abnormal; Notable for the following components:   pO2, Ven <31.0 (*)    Bicarbonate 28.4 (*)    Acid-Base Excess 2.6 (*)    All other components within normal limits  GLUCOSE, CAPILLARY - Abnormal; Notable for the following components:   Glucose-Capillary >600 (*)    All other components within normal limits  SARS CORONAVIRUS 2 BY RT PCR (HOSPITAL ORDER, PERFORMED IN [facility])  BETA-HYDROXYBUTYRIC ACID  BETA-HYDROXYBUTYRIC ACID  BETA-HYDROXYBUTYRIC ACID  URINALYSIS, ROUTINE W REFLEX MICROSCOPIC  URINE DRUG SCREEN, QUALITATIVE ([facility] ONLY)  CBG MONITORING, ED  CBG MONITORING, ED  CBG MONITORING, ED    ____________________________________________  EKG ED ECG REPORT I, [place] Spenser Cong, the attending physician, personally viewed and interpreted this ECG.   Date: 05/18/2020  EKG Time: 10:35 PM  Rate: 116  Rhythm: Sinus tachycardia  Axis: Normal  Intervals: Normal  ST&T Change: None     PROCEDURES     .Critical Care Performed by: Darci Current, MD Authorized by: Darci Current, MD   Critical care provider statement:    Critical care time (minutes):  30   Critical care time was exclusive of:  Separately billable procedures and treating other patients   Critical care was necessary to treat or prevent imminent or life-threatening deterioration of the following conditions:  Endocrine crisis   Critical care was time spent personally by me on the following activities:  Development of treatment plan with patient or surrogate, discussions with consultants, evaluation of patient's  response to treatment, examination of patient, obtaining history from patient or surrogate, ordering and performing treatments and interventions, ordering and review of laboratory studies, ordering and review of radiographic studies, pulse oximetry, re-evaluation of patient's condition and review of old charts     ____________________________________________   INITIAL IMPRESSION / MDM / ASSESSMENT AND PLAN / ED COURSE  As part of my medical decision making, I reviewed the following data within the electronic MEDICAL RECORD NUMBER  49 year old male presented with above-stated history and physical exam differential diagnosis including but not limited to DKA versus hyperosmolar nonketotic hyperglycemia..  Laboratory data findings consistent with DKA.  As such patient received 3 L of IV normal saline insulin infusion initiated.  Patient discussed with ICU staff for hospital admission for further evaluation and management.  ____________________________________________  FINAL CLINICAL IMPRESSION(S) / ED  DIAGNOSES  Final diagnoses:  Diabetic ketoacidosis without coma associated with type 2 diabetes mellitus (HCC)     MEDICATIONS GIVEN DURING THIS VISIT:  Medications  insulin regular, human (MYXREDLIN) 100 units/ 100 mL infusion (8.5 Units/hr Intravenous New Bag/Given 05/19/20 0027)  lactated ringers infusion (has no administration in time range)  dextrose 5 % in lactated ringers infusion (has no administration in time range)  dextrose 50 % solution 0-50 mL (has no administration in time range)  sodium chloride 0.9 % bolus 1,000 mL (1,000 mLs Intravenous New Bag/Given 05/19/20 0012)  potassium chloride 10 mEq in 100 mL IVPB (10 mEq Intravenous New Bag/Given 05/19/20 0028)  sodium chloride 0.9 % bolus 1,000 mL (1,000 mLs Intravenous New Bag/Given 05/18/20 2248)     ED Discharge Orders    None      *Please note:  Andrew Buchanan was evaluated in Emergency Department on 05/19/2020 for the symptoms described in the history of present illness. He was evaluated in the context of the global COVID-19 pandemic, which necessitated consideration that the patient might be at risk for infection with the SARS-CoV-2 virus that causes COVID-19. Institutional protocols and algorithms that pertain to the evaluation of patients at risk for COVID-19 are in a state of rapid change based on information released by regulatory bodies including the CDC and federal and state organizations. These policies and algorithms were followed during the patient's care in the ED.  Some ED evaluations and interventions may be delayed as a result of limited staffing during and after the pandemic.*  Note:  This document was prepared using Dragon voice recognition software and may include unintentional dictation errors.   Darci Current, MD 05/19/20 0110

## 2020-05-19 NOTE — ED Notes (Signed)
Pt stating he is hungry and thirsty. Pt informed he is NPO. MD messaged about pt wanting to eat and drink.

## 2020-05-19 NOTE — ED Notes (Signed)
BG 216, per order from NP Elvina Sidle, stop endo tool and dc IV insulin and D5 LR drip. Start LR IV fluids.

## 2020-05-19 NOTE — ED Notes (Signed)
Resting in bed with eyes closed, rise and fall of chest noted. NAD. Cardiac monitor intact, insulin infusing per endo tool.

## 2020-05-20 DIAGNOSIS — Z9114 Patient's other noncompliance with medication regimen: Secondary | ICD-10-CM

## 2020-05-20 LAB — MAGNESIUM: Magnesium: 2.6 mg/dL — ABNORMAL HIGH (ref 1.7–2.4)

## 2020-05-20 LAB — GLUCOSE, CAPILLARY
Glucose-Capillary: 236 mg/dL — ABNORMAL HIGH (ref 70–99)
Glucose-Capillary: 247 mg/dL — ABNORMAL HIGH (ref 70–99)
Glucose-Capillary: 258 mg/dL — ABNORMAL HIGH (ref 70–99)
Glucose-Capillary: 317 mg/dL — ABNORMAL HIGH (ref 70–99)

## 2020-05-20 LAB — BASIC METABOLIC PANEL
Anion gap: 10 (ref 5–15)
BUN: <5 mg/dL — ABNORMAL LOW (ref 6–20)
CO2: 25 mmol/L (ref 22–32)
Calcium: 8.1 mg/dL — ABNORMAL LOW (ref 8.9–10.3)
Chloride: 94 mmol/L — ABNORMAL LOW (ref 98–111)
Creatinine, Ser: 0.57 mg/dL — ABNORMAL LOW (ref 0.61–1.24)
GFR calc Af Amer: >60 mL/min (ref >60)
GFR calc non Af Amer: >60 mL/min (ref >60)
Glucose, Bld: 277 mg/dL — ABNORMAL HIGH (ref 70–99)
Potassium: 3.6 mmol/L (ref 3.5–5.1)
Sodium: 129 mmol/L — ABNORMAL LOW (ref 135–145)

## 2020-05-20 LAB — PHOSPHORUS: Phosphorus: 1.8 mg/dL — ABNORMAL LOW (ref 2.5–4.6)

## 2020-05-20 LAB — HEMOGLOBIN A1C
Hgb A1c MFr Bld: >15.5 % — ABNORMAL HIGH (ref 4.8–5.6)
Mean Plasma Glucose: >398 mg/dL

## 2020-05-20 LAB — HIV ANTIBODY (ROUTINE TESTING W REFLEX): HIV Screen 4th Generation wRfx: NONREACTIVE

## 2020-05-20 MED ORDER — INSULIN ASPART 100 UNIT/ML ~~LOC~~ SOLN
6.0000 [IU] | Freq: Three times a day (TID) | SUBCUTANEOUS | Status: DC
  Administered 2020-05-20: 6 [IU] via SUBCUTANEOUS
  Filled 2020-05-20: qty 1

## 2020-05-20 MED ORDER — INSULIN ASPART 100 UNIT/ML ~~LOC~~ SOLN
4.0000 [IU] | Freq: Once | SUBCUTANEOUS | Status: AC
  Administered 2020-05-20: 4 [IU] via SUBCUTANEOUS
  Filled 2020-05-20: qty 1

## 2020-05-20 MED ORDER — GABAPENTIN 300 MG PO CAPS: 600.0000 mg | ORAL_CAPSULE | Freq: Two times a day (BID) | ORAL | 1 refills | Status: DC

## 2020-05-20 MED ORDER — MAGNESIUM SULFATE 2 GM/50ML IV SOLN: 2.0000 g | Freq: Once | INTRAVENOUS | Status: DC

## 2020-05-20 MED ORDER — LOSARTAN POTASSIUM 50 MG PO TABS
50.0000 mg | ORAL_TABLET | Freq: Every day | ORAL | Status: DC
  Administered 2020-05-20: 50 mg via ORAL
  Filled 2020-05-20: qty 1

## 2020-05-20 MED ORDER — POTASSIUM PHOSPHATES 15 MMOLE/5ML IV SOLN
23.0000 mmol | Freq: Once | INTRAVENOUS | Status: DC
  Filled 2020-05-20: qty 7.67

## 2020-05-20 MED ORDER — METFORMIN HCL 500 MG PO TABS: 1000.0000 mg | ORAL_TABLET | Freq: Two times a day (BID) | ORAL | Status: DC

## 2020-05-20 MED ORDER — ADULT MULTIVITAMIN W/MINERALS CH: 1.0000 | ORAL_TABLET | Freq: Every day | ORAL | 0 refills | Status: DC

## 2020-05-20 MED ORDER — LORAZEPAM 2 MG/ML IJ SOLN
2.0000 mg | INTRAMUSCULAR | Status: AC
  Administered 2020-05-20: 2 mg via INTRAVENOUS
  Filled 2020-05-20: qty 1

## 2020-05-20 MED ORDER — LOSARTAN POTASSIUM 50 MG PO TABS: 50.0000 mg | ORAL_TABLET | Freq: Every day | ORAL | 1 refills | Status: DC

## 2020-05-20 MED ORDER — INSULIN GLARGINE 100 UNIT/ML ~~LOC~~ SOLN
35.0000 [IU] | Freq: Every day | SUBCUTANEOUS | Status: DC
  Filled 2020-05-20: qty 0.35

## 2020-05-20 MED ORDER — LORAZEPAM 1 MG PO TABS: 1.0000 mg | ORAL_TABLET | ORAL | Status: DC | PRN

## 2020-05-20 MED ORDER — VITAMIN D (ERGOCALCIFEROL) 1.25 MG (50000 UNIT) PO CAPS
50000.0000 [IU] | ORAL_CAPSULE | ORAL | Status: DC
  Administered 2020-05-20: 50000 [IU] via ORAL
  Filled 2020-05-20: qty 1

## 2020-05-20 MED ORDER — INSULIN GLARGINE 100 UNIT/ML ~~LOC~~ SOLN
9.0000 [IU] | Freq: Once | SUBCUTANEOUS | Status: AC
  Administered 2020-05-20: 9 [IU] via SUBCUTANEOUS
  Filled 2020-05-20: qty 0.09

## 2020-05-20 MED ORDER — INSULIN GLARGINE 100 UNIT/ML ~~LOC~~ SOLN: 35.0000 [IU] | Freq: Every day | SUBCUTANEOUS | 11 refills | Status: DC

## 2020-05-20 MED ORDER — LORAZEPAM 2 MG/ML IJ SOLN
1.0000 mg | INTRAMUSCULAR | Status: DC | PRN
  Administered 2020-05-20: 2 mg via INTRAVENOUS
  Filled 2020-05-20: qty 1

## 2020-05-20 MED ORDER — INSULIN ASPART 100 UNIT/ML ~~LOC~~ SOLN: 6.0000 [IU] | Freq: Three times a day (TID) | SUBCUTANEOUS | 11 refills | Status: DC

## 2020-05-20 MED ORDER — METFORMIN HCL 1000 MG PO TABS: 1000.0000 mg | ORAL_TABLET | Freq: Two times a day (BID) | ORAL | 1 refills | Status: DC

## 2020-05-20 MED ORDER — K PHOS MONO-SOD PHOS DI & MONO 155-852-130 MG PO TABS
500.0000 mg | ORAL_TABLET | ORAL | Status: DC
  Administered 2020-05-20 (×2): 500 mg via ORAL
  Filled 2020-05-20 (×4): qty 2

## 2020-05-20 MED ORDER — INSULIN ASPART 100 UNIT/ML ~~LOC~~ SOLN: 6.0000 [IU] | Freq: Once | SUBCUTANEOUS | Status: DC

## 2020-05-20 NOTE — Progress Notes (Signed)
Pt with increasing agitation remains alert and oriented, therefore ordered additional dose of 2 mg iv ativan.  Pt states he is hungry all the time and has requested multiple snack items throughout the night despite education regarding importance of carb modified diet for blood sugar control.  He states he is anxious and feels like his hands and feet are "cold and heavy."  Upon assessment pts bilateral hands and feet are warm to touch with 2+bilateral palpable pulses and no edema present.  He is requesting a sandwich, I informed the pt he can have a sandwich.  However, I told Andrew Buchanan I do not want him to eat graham crackers or other high carb snacks and he said he would not.  I also told Andrew Buchanan I will place an order so he can get carb modified snacks between meals.  He states he would like counseling, transition of care team order in place for substance abuse counseling/education and I will also consult the chaplin to speak with Andrew Buchanan.  Will continue to monitor and assess pt.  Sonda Rumble, AGNP  Pulmonary/Critical Care Pager [phone] (please enter 7 digits) PCCM Consult Pager [phone] (please enter 7 digits)

## 2020-05-20 NOTE — Clinical Social Work Note (Signed)
Referral made to [facility]. Md sent prescriptions to [facility]. TOC assistant will pick up meds and give them to pt at bedside prior to d/c. Pt has last dose of Potassium at 2:00--meds will be delivered to pt prior to.  [place], [place] [phone]

## 2020-05-20 NOTE — Discharge Summary (Signed)
[facility] [place] [facility]   PATIENT NAME: Andrew Buchanan    MR#:  [id number]  DATE OF BIRTH:  1970-11-29  DATE OF ADMISSION:  05/18/2020 ADMITTING PHYSICIAN: Uvaldo Rising, MD  DATE OF DISCHARGE: 05/20/2020  PRIMARY CARE PHYSICIAN: Patient, No Pcp Per    ADMISSION DIAGNOSIS:  DKA (diabetic ketoacidoses) (HCC) [E11.10] Diabetic ketoacidosis without coma associated with type 2 diabetes mellitus (HCC) [E11.10]  DISCHARGE DIAGNOSIS:  DKA--resolved uncontrolled type II diabetes due to medication/diet/follow-up noncompliance  SECONDARY DIAGNOSIS:   Past Medical History:  Diagnosis Date  . Alcohol abuse   . Anxiety   . Diabetes mellitus without complication (HCC)   . Hypertension   . Pancreatitis     HOSPITAL COURSE:  49 yo male with a PMH of Type II Diabetes Mellitus, HTN, Pancreatitis, Anxiety, Polysubstance Abuse (Crack Cocaine), and ETOH Abuse.  He presented to [facility] ER on 08/11 with generalized weakness and incontinence.  He states he has been detoxing from crack cocaine and alcohol for the past 40 days.  Diabetic ketoacidosis secondary to medication noncompliance  Hyponatremia secondary to hypovolemia in setting of DKA with Type 2 DM -Replaced electrolytes as indicated  --received IV fluids per DKA protocol  --Diabetes coordinator consulted appreciate input --Educated pt regarding importance of medication and diet compliance  -- patient got insulin and rest of his home meds through med management clinic -- he was also given glucometer -- I tried to call his parents however after a couple times unable to reach them.  Polysubstance abuse and ETOH Abuse Hx continue multivitamin patient says he has not had any alcohol or cocaine for 40 days  Hypophosphotemia -- patient received oral K phosphorus in divided doses today  DVT prophylaxis Lovenox  Status is: Inpatient  Dispo: The patient is from: Home              Anticipated d/c is to:  Home              Anticipated d/c date is:  today              Patient currently is medically stable to d/c.   Patient will discharge today. [facility] He is asked to get his medication refilled from medication management clinic. Glucometer was provided  CONSULTS OBTAINED:    DRUG ALLERGIES:   Allergies  Allergen Reactions  . Antihistamines, Chlorpheniramine-Type Anaphylaxis    DISCHARGE MEDICATIONS:   Allergies as of 05/20/2020      Reactions   Antihistamines, Chlorpheniramine-type Anaphylaxis      Medication List    STOP taking these medications   divalproex 500 MG 24 hr tablet Commonly known as: DEPAKOTE ER     TAKE these medications   gabapentin 300 MG capsule Commonly known as: NEURONTIN Take 2 capsules (600 mg total) by mouth 2 (two) times daily.   insulin aspart 100 UNIT/ML injection Commonly known as: novoLOG Inject 6 Units into the skin 3 (three) times daily with meals.   insulin glargine 100 UNIT/ML injection Commonly known as: LANTUS Inject 0.35 mLs (35 Units total) into the skin daily. Start taking on: May 21, 2020   losartan 50 MG tablet Commonly known as: COZAAR Take 1 tablet (50 mg total) by mouth daily.   metFORMIN 1000 MG tablet Commonly known as: GLUCOPHAGE Take 1 tablet (1,000 mg total) by mouth 2 (two) times daily with a meal.   multivitamin with minerals Tabs tablet Take 1 tablet by mouth  daily. Start taking on: May 21, 2020   Vitamin D3 1.25 MG (50000 UT) Caps Take 50,000 Units by mouth every 7 (seven) days.       If you experience worsening of your admission symptoms, develop shortness of breath, life threatening emergency, suicidal or homicidal thoughts you must seek medical attention immediately by calling 911 or calling your MD immediately  if symptoms less severe.  You Must read complete instructions/literature along with all the possible adverse reactions/side effects for all the  Medicines you take and that have been prescribed to you. Take any new Medicines after you have completely understood and accept all the possible adverse reactions/side effects.   Please note  You were cared for by a hospitalist during your hospital stay. If you have any questions about your discharge medications or the care you received while you were in the hospital after you are discharged, you can call the unit and asked to speak with the hospitalist on call if the hospitalist that took care of you is not available. Once you are discharged, your primary care physician will handle any further medical issues. Please note that NO REFILLS for any discharge medications will be authorized once you are discharged, as it is imperative that you return to your primary care physician (or establish a relationship with a primary care physician if you do not have one) for your aftercare needs so that they can reassess your need for medications and monitor your lab values. Today   SUBJECTIVE   Asking for more food and snacks  VITAL SIGNS:  Blood pressure 109/65, pulse (!) 103, temperature 98.9 F (37.2 C), temperature source Oral, resp. rate 20, weight 87.6 kg, SpO2 97 %.  I/O:    Intake/Output Summary (Last 24 hours) at 05/20/2020 1528 Last data filed at 05/20/2020 1354 Gross per 24 hour  Intake 1720 ml  Output 6950 ml  Net -5230 ml    PHYSICAL EXAMINATION:  GENERAL:  49 y.o.-year-old patient lying in the bed with no acute distress.  EYES: Pupils equal, round, reactive to light and accommodation. No scleral icterus.  HEENT: Head atraumatic, normocephalic. Oropharynx and nasopharynx clear.  NECK:  Supple, no jugular venous distention. No thyroid enlargement, no tenderness.  LUNGS: Normal breath sounds bilaterally, no wheezing, rales,rhonchi or crepitation. No use of accessory muscles of respiration.  CARDIOVASCULAR: S1, S2 normal. No murmurs, rubs, or gallops.  ABDOMEN: Soft, non-tender,  non-distended. Bowel sounds present. No organomegaly or mass.  EXTREMITIES: No pedal edema, cyanosis, or clubbing.  NEUROLOGIC: Cranial nerves II through XII are intact. Muscle strength 5/5 in all extremities. Sensation intact. Gait not checked.  PSYCHIATRIC: The patient is alert and oriented x 3.  SKIN: No obvious rash, lesion, or ulcer.   DATA REVIEW:   CBC  Recent Labs  Lab 05/19/20 0346  WBC 14.3*  HGB 11.7*  HCT RESULTS UNAVAILABLE DUE TO INTERFERING SUBSTANCE  PLT 147*    Chemistries  Recent Labs  Lab 05/20/20 0558  NA 129*  K 3.6  CL 94*  CO2 25  GLUCOSE 277*  BUN <5*  CREATININE 0.57*  CALCIUM 8.1*  MG 2.6*    Microbiology Results   Recent Results (from the past 240 hour(s))  SARS Coronavirus 2 by RT PCR (hospital order, performed in [facility] hospital lab) Nasopharyngeal Nasopharyngeal Swab     Status: None   Collection Time: 05/19/20 12:11 AM   Specimen: Nasopharyngeal Swab  Result Value Ref Range Status   SARS Coronavirus 2 NEGATIVE  NEGATIVE Final    Comment: (NOTE) SARS-CoV-2 target nucleic acids are NOT DETECTED.  The SARS-CoV-2 RNA is generally detectable in upper and lower respiratory specimens during the acute phase of infection. The lowest concentration of SARS-CoV-2 viral copies this assay can detect is 250 copies / mL. A negative result does not preclude SARS-CoV-2 infection and should not be used as the sole basis for treatment or other patient management decisions.  A negative result may occur with improper specimen collection / handling, submission of specimen other than nasopharyngeal swab, presence of viral mutation(s) within the areas targeted by this assay, and inadequate number of viral copies (<250 copies / mL). A negative result must be combined with clinical observations, patient history, and epidemiological information.  Fact Sheet for Patients:   BoilerBrush.com.cy  Fact Sheet for Healthcare  Providers: https://pope.com/  This test is not yet approved or  cleared by the [place] FDA and has been authorized for detection and/or diagnosis of SARS-CoV-2 by FDA under an Emergency Use Authorization (EUA).  This EUA will remain in effect (meaning this test can be used) for the duration of the COVID-19 declaration under Section 564(b)(1) of the Act, 21 U.S.C. section 360bbb-3(b)(1), unless the authorization is terminated or revoked sooner.  Performed at [facility], [address], [place], [place] [address]     RADIOLOGY:  No results found.   CODE STATUS:     Code Status Orders  (From admission, onward)         Start     Ordered   05/19/20 0209  Full code  Continuous        05/19/20 0209        Code Status History    This patient has a current code status but no historical code status.   Advance Care Planning Activity       TOTAL TIME TAKING CARE OF THIS PATIENT: *35* minutes.    Enedina Finner M.D  [facility]    CC: Primary care physician; Patient, No Pcp Per

## 2020-05-20 NOTE — Progress Notes (Addendum)
Inpatient Diabetes Program Recommendations  AACE/ADA: New Consensus Statement on Inpatient Glycemic Control (2015)  Target Ranges:  Prepandial:   less than 140 mg/dL      Peak postprandial:   less than 180 mg/dL (1-2 hours)      Critically ill patients:  140 - 180 mg/dL   Results for Andrew Buchanan, Andrew Buchanan (MRN [id number]) as of 05/20/2020 07:15  Ref. Range 05/18/2020 22:42  Glucose Latest Ref Range: 70 - 99 mg/dL [facility] [facility])   Results for Andrew Buchanan, Andrew Buchanan (MRN [id number]) as of 05/20/2020 07:15  Ref. Range 05/19/2020 08:50 05/19/2020 10:03 05/19/2020 11:34 05/19/2020 12:40 05/19/2020 13:36 05/19/2020 15:01 05/19/2020 15:56 05/19/2020 17:30 05/19/2020 18:34 05/19/2020 21:39  Glucose-Capillary Latest Ref Range: 70 - 99 mg/dL 802 (H)  IV Insulin Drip 283 (H)  IV Insulin Drip 356 (H)  IV Insulin Drip 224 (H)  IV Insulin Drip 138 (H)  IV Insulin Drip 129 (H)  IV Insulin Drip 105 (H)  IV Insulin Drip  26 units LANTUS given at 4:30pm 284 (H)  IV Insulin Drip 216 (H)  IV Insulin Drip Stopped  5 units NOVOLOG  319 (H)  4 units NOVOLOG    Results for Andrew Buchanan, Andrew Buchanan (MRN [id number]) as of 05/20/2020 09:12  Ref. Range 05/20/2020 00:05 05/20/2020 04:14 05/20/2020 07:45  Glucose-Capillary Latest Ref Range: 70 - 99 mg/dL 975 (H) 300 (H)  4 units NOVOLOG 317 (H)   Results for Andrew Buchanan, Andrew Buchanan (MRN [id number]) as of 05/20/2020 11:43  Ref. Range 05/19/2020 11:50  Hemoglobin A1C Latest Ref Range: 4.8 - 5.6 % >15.5 (H)   Diabetes history: DM2  Outpatient Diabetes meds: Metformin 1000 mg BID, Actos 30 mg daily (not taking Actos per med rec)  Current orders: Novolog Moderate Correction Scale/ SSI (0-15 units) TID AC + HS     Lantus 26 units Daily     Transitioned off IV insulin drip to Lantus and Novolog late yesterday afternoon.  A1c level extremely high--Will need Insulin for home  Per H&P, patient "has been detoxing from crack cocaine and alcohol for the past 40 days. He  states since he started detoxing he has craved sweets, therefore he eats 2 gallons of ice cream daily and drinks 2 gallons of milk daily."   No insurance or PCP listed.    MD- Please consider the following in-hospital insulin adjustments:  1. Please increase the Lantus to 35 units Daily (30% increase of dose)  Since 26 unit dose already given this AM, please order an extra 9 units Lantus to be given X 1 this AM  2. Start Novolog Meal Coverage: Novolog 6 units TID with meals  (Please add the following Hold Parameters: Hold if pt eats <50% of meal, Hold if pt NPO)     --Will follow patient during hospitalization--  Ambrose Finland RN, MSN, CDE Diabetes Coordinator Inpatient Glycemic Control Team Team Pager: [phone] (8a-5p)

## 2020-05-20 NOTE — Progress Notes (Signed)
Ch visited with Pt in response to OR. Ch present while RN and diabetes coordinator educated Pt and Pt's friend about progress. Pt supported by friend Andrew Buchanan. Pt reported his attempts to get off of drugs. Pt requested prayer to change his life, for mother with pace-maker to not worry about him, and for father to accept him. Ch prayed with them. Pt was tearful after prayer. Ch let Pt know about 24/7 chaplain availability. Pt said he will avail chaplain's support when needed.

## 2020-05-20 NOTE — Progress Notes (Signed)
Khallid Todd Both  A and O x 4 VSS. Pt tolerating diet well. No complaints of pain or nausea. IV removed intact, prescriptions given. Pt voices understanding of discharge instructions with no further questions. Pt discharged via wheelchair with axillary.   Allergies as of 05/20/2020      Reactions   Antihistamines, Chlorpheniramine-type Anaphylaxis      Medication List    STOP taking these medications   divalproex 500 MG 24 hr tablet Commonly known as: DEPAKOTE ER     TAKE these medications   gabapentin 300 MG capsule Commonly known as: NEURONTIN Take 2 capsules (600 mg total) by mouth 2 (two) times daily.   insulin aspart 100 UNIT/ML injection Commonly known as: novoLOG Inject 6 Units into the skin 3 (three) times daily with meals.   insulin glargine 100 UNIT/ML injection Commonly known as: LANTUS Inject 0.35 mLs (35 Units total) into the skin daily. Start taking on: May 21, 2020   losartan 50 MG tablet Commonly known as: COZAAR Take 1 tablet (50 mg total) by mouth daily.   metFORMIN 1000 MG tablet Commonly known as: GLUCOPHAGE Take 1 tablet (1,000 mg total) by mouth 2 (two) times daily with a meal.   multivitamin with minerals Tabs tablet Take 1 tablet by mouth daily. Start taking on: May 21, 2020   Vitamin D3 1.25 MG (50000 UT) Caps Take 50,000 Units by mouth every 7 (seven) days.       Vitals:   05/20/20 0946 05/20/20 1158  BP: 119/71 109/65  Pulse: (!) 106 (!) 103  Resp: 18 20  Temp: 97.8 F (36.6 C) 98.9 F (37.2 C)  SpO2: 95% 97%    Saranya Harlin Etheleen Mayhew

## 2020-05-20 NOTE — Progress Notes (Addendum)
Pharmacy Electrolyte Monitoring Consult:  Pharmacy consulted to assist in monitoring and replacing electrolytes in this 49 y.o. male admitted on 05/18/2020 with Hyperglycemia  Labs:  Sodium (mmol/L)  Date Value  05/20/2020 129 (L)  08/18/2014 142   Potassium (mmol/L)  Date Value  05/20/2020 3.6  08/18/2014 3.8   Magnesium (mg/dL)  Date Value  57/26/2035 2.6 (H)   Phosphorus (mg/dL)  Date Value  59/74/1638 1.8 (L)   Calcium (mg/dL)  Date Value  45/36/4680 8.1 (L)   Calcium, Total (mg/dL)  Date Value  32/09/2481 8.4 (L)   Albumin (g/dL)  Date Value  50/12/7046 4.6  08/18/2014 3.9    Assessment/Plan: --K 3.6, patient no longer on insulin infusion. K is therapeutic, but borderline low --Mg 2.6, slightly elevated after replacement with IV Mg 4g x2 --Phos 2.8>1.8, will replace with K Phos --Will continue to monitor BMP, Phos, and Mg and replace as indicated  Raiford Noble, PharmD Pharmacy Resident  05/20/2020 7:43 AM                      Update 05/20/2020 @ 8891 Per MD, order oral replacement. Will order KPhos neutral 1000 mg ever 4 hours x4 doses will recheck phosphorus with AM labs.

## 2020-05-20 NOTE — Progress Notes (Signed)
Met w/ pt this afternoon to discuss going home on insulin.  Friend of pt at bedside.    Pt told me he has given insulin injections in the past about 4 years ago when he lived in [place].  Pt was very impulsive during our conversation and needed to be redirected frequently.  Seemed fixated on the tingling and numbness in his feet and hands.  When I went to show him how to use an insulin pen, he grabbed it from me and told me he already knew how to use it.  We re-reviewed how to use the pen and pt was able to provide correct use of pen with minimal assistance.  Educated patient on insulin pen use at home.  Reviewed all steps of insulin pen including attachment of needle, 2-unit air shot, dialing up dose, giving injection, rotation of injection sites, removing needle, disposal of sharps, storage of unused insulin, disposal of insulin etc.  Patient able to provide successful return demonstration.  Reviewed troubleshooting with insulin pen.  Strongly encouraged pt to take his time with injections at home and to make sure he has the right insulin, for the right time, in the right amount.  Also reviewed Lantus and Novolog and how they work and when to take.  Discussed A1C results with them and explained what an A1C is, basic home care, basic diabetes diet nutrition principles, importance of checking CBGs and maintaining good CBG control to prevent long-term and short-term complications.  Reviewed signs and symptoms of hyperglycemia and hypoglycemia and how to treat hypoglycemia at home.  Also reviewed blood sugar goals and A1c goals for home.    RNs to provide ongoing basic DM education at bedside with this patient.  RNs allowing pt to give all injections.    --Will follow patient during hospitalization--  Wyn Quaker RN, MSN, CDE Diabetes Coordinator Inpatient Glycemic Control Team Team Pager: [phone] (8a-5p)

## 2020-05-20 NOTE — Plan of Care (Signed)
Nutrition Education Note   RD consulted for nutrition education regarding diabetes.   49 yo male with a PMH of Type II Diabetes Mellitus, HTN, Pancreatitis, Anxiety, Polysubstance Abuse (Crack Cocaine) and ETOH Abuse who is admitted with DKA  Lab Results  Component Value Date   HGBA1C >15.5 (H) 05/19/2020    RD provided "Nutrition and Type II Diabetes" handout from the Academy of Nutrition and Dietetics. Discussed different food groups and their effects on blood sugar, emphasizing carbohydrate-containing foods. Provided list of carbohydrates and recommended serving sizes of common foods.  Discussed importance of controlled and consistent carbohydrate intake throughout the day. Provided examples of ways to balance meals/snacks and encouraged intake of high-fiber, whole grain complex carbohydrates. Teach back method used.  Expect poor compliance.  Body mass index is 26.19 kg/m. Pt meets criteria for overweight based on current BMI.  Current diet order is CHO controlled, patient is consuming approximately 100% of meals at this time. Labs and medications reviewed. No further nutrition interventions warranted at this time. RD contact information provided. If additional nutrition issues arise, please re-consult RD.  Betsey Holiday MS, RD, LDN Please refer to [facility] for RD and/or RD on-call/weekend/after hours pager

## 2020-05-20 NOTE — Discharge Instructions (Signed)
Check your sugar three times a day. Take your insulin as instructed. Eat carb control diet

## 2020-08-30 ENCOUNTER — Telehealth: Payer: Self-pay | Admitting: Pharmacist

## 2020-08-30 NOTE — Telephone Encounter (Signed)
Patient failed to provide requested 2021 financial documentation. No additional medication assistance will be provided by [facility] without the required proof of income documentation. Patient notified by letter Debra Cheek Administrative Assistant [facility] 

## 2020-09-20 DIAGNOSIS — R739 Hyperglycemia, unspecified: Secondary | ICD-10-CM | POA: Insufficient documentation

## 2020-09-20 DIAGNOSIS — Z5321 Procedure and treatment not carried out due to patient leaving prior to being seen by health care provider: Secondary | ICD-10-CM | POA: Insufficient documentation

## 2020-09-20 NOTE — ED Triage Notes (Signed)
EMS brings pt in for c/o hyperglycemia; +ETOH

## 2020-09-21 ENCOUNTER — Emergency Department
Admission: EM | Admit: 2020-09-21 | Discharge: 2020-09-21 | Disposition: A | Discharge status: Home / Self Care | Payer: 59 | Attending: Emergency Medicine | Admitting: Emergency Medicine

## 2020-09-21 NOTE — ED Notes (Signed)
No answer when called several times from lobby 

## 2020-10-04 DIAGNOSIS — N360 Urethral fistula: Secondary | ICD-10-CM | POA: Insufficient documentation

## 2021-01-05 ENCOUNTER — Emergency Department: Payer: BLUE CROSS/BLUE SHIELD

## 2021-01-05 ENCOUNTER — Inpatient Hospital Stay
Admission: EM | Admit: 2021-01-05 | Discharge: 2021-01-16 | DRG: 871 | Disposition: A | Discharge status: Home / Self Care | Payer: BLUE CROSS/BLUE SHIELD | Attending: Internal Medicine | Admitting: Internal Medicine

## 2021-01-05 ENCOUNTER — Other Ambulatory Visit: Payer: Self-pay

## 2021-01-05 DIAGNOSIS — F10239 Alcohol dependence with withdrawal, unspecified: Secondary | ICD-10-CM | POA: Diagnosis present

## 2021-01-05 DIAGNOSIS — E1100 Type 2 diabetes mellitus with hyperosmolarity without nonketotic hyperglycemic-hyperosmolar coma (NKHHC): Secondary | ICD-10-CM

## 2021-01-05 DIAGNOSIS — R4182 Altered mental status, unspecified: Secondary | ICD-10-CM

## 2021-01-05 DIAGNOSIS — E440 Moderate protein-calorie malnutrition: Secondary | ICD-10-CM | POA: Insufficient documentation

## 2021-01-05 DIAGNOSIS — I700 Atherosclerosis of aorta: Secondary | ICD-10-CM | POA: Diagnosis present

## 2021-01-05 DIAGNOSIS — F1010 Alcohol abuse, uncomplicated: Secondary | ICD-10-CM | POA: Diagnosis not present

## 2021-01-05 DIAGNOSIS — Z7984 Long term (current) use of oral hypoglycemic drugs: Secondary | ICD-10-CM | POA: Diagnosis not present

## 2021-01-05 DIAGNOSIS — Z20822 Contact with and (suspected) exposure to covid-19: Secondary | ICD-10-CM | POA: Diagnosis present

## 2021-01-05 DIAGNOSIS — Z9111 Patient's noncompliance with dietary regimen: Secondary | ICD-10-CM | POA: Diagnosis not present

## 2021-01-05 DIAGNOSIS — Z888 Allergy status to other drugs, medicaments and biological substances status: Secondary | ICD-10-CM | POA: Diagnosis not present

## 2021-01-05 DIAGNOSIS — E1101 Type 2 diabetes mellitus with hyperosmolarity with coma: Secondary | ICD-10-CM | POA: Diagnosis present

## 2021-01-05 DIAGNOSIS — Z6823 Body mass index (BMI) 23.0-23.9, adult: Secondary | ICD-10-CM

## 2021-01-05 DIAGNOSIS — Z9114 Patient's other noncompliance with medication regimen: Secondary | ICD-10-CM | POA: Diagnosis not present

## 2021-01-05 DIAGNOSIS — R6521 Severe sepsis with septic shock: Secondary | ICD-10-CM | POA: Diagnosis present

## 2021-01-05 DIAGNOSIS — Z91148 Patient's other noncompliance with medication regimen for other reason: Secondary | ICD-10-CM

## 2021-01-05 DIAGNOSIS — D539 Nutritional anemia, unspecified: Secondary | ICD-10-CM | POA: Diagnosis present

## 2021-01-05 DIAGNOSIS — E1165 Type 2 diabetes mellitus with hyperglycemia: Secondary | ICD-10-CM | POA: Diagnosis not present

## 2021-01-05 DIAGNOSIS — F1090 Alcohol use, unspecified, uncomplicated: Secondary | ICD-10-CM | POA: Diagnosis present

## 2021-01-05 DIAGNOSIS — N179 Acute kidney failure, unspecified: Secondary | ICD-10-CM | POA: Diagnosis present

## 2021-01-05 DIAGNOSIS — A4151 Sepsis due to Escherichia coli [E. coli]: Principal | ICD-10-CM | POA: Diagnosis present

## 2021-01-05 DIAGNOSIS — E875 Hyperkalemia: Secondary | ICD-10-CM | POA: Diagnosis present

## 2021-01-05 DIAGNOSIS — I10 Essential (primary) hypertension: Secondary | ICD-10-CM | POA: Diagnosis present

## 2021-01-05 DIAGNOSIS — K5730 Diverticulosis of large intestine without perforation or abscess without bleeding: Secondary | ICD-10-CM | POA: Diagnosis present

## 2021-01-05 DIAGNOSIS — E871 Hypo-osmolality and hyponatremia: Secondary | ICD-10-CM | POA: Diagnosis present

## 2021-01-05 DIAGNOSIS — G928 Other toxic encephalopathy: Secondary | ICD-10-CM | POA: Diagnosis present

## 2021-01-05 DIAGNOSIS — A419 Sepsis, unspecified organism: Secondary | ICD-10-CM

## 2021-01-05 DIAGNOSIS — R6520 Severe sepsis without septic shock: Secondary | ICD-10-CM

## 2021-01-05 DIAGNOSIS — G2581 Restless legs syndrome: Secondary | ICD-10-CM | POA: Diagnosis present

## 2021-01-05 DIAGNOSIS — N170 Acute kidney failure with tubular necrosis: Secondary | ICD-10-CM | POA: Diagnosis present

## 2021-01-05 DIAGNOSIS — F1410 Cocaine abuse, uncomplicated: Secondary | ICD-10-CM | POA: Diagnosis present

## 2021-01-05 DIAGNOSIS — E872 Acidosis: Secondary | ICD-10-CM | POA: Diagnosis present

## 2021-01-05 DIAGNOSIS — Z79899 Other long term (current) drug therapy: Secondary | ICD-10-CM

## 2021-01-05 DIAGNOSIS — Z794 Long term (current) use of insulin: Secondary | ICD-10-CM

## 2021-01-05 DIAGNOSIS — H538 Other visual disturbances: Secondary | ICD-10-CM | POA: Diagnosis present

## 2021-01-05 DIAGNOSIS — F10121 Alcohol abuse with intoxication delirium: Secondary | ICD-10-CM

## 2021-01-05 DIAGNOSIS — F419 Anxiety disorder, unspecified: Secondary | ICD-10-CM | POA: Diagnosis present

## 2021-01-05 HISTORY — DX: Type 2 diabetes mellitus with hyperosmolarity without nonketotic hyperglycemic-hyperosmolar coma (NKHHC): E11.00

## 2021-01-05 LAB — CBC WITH DIFFERENTIAL/PLATELET
Abs Immature Granulocytes: 1.58 10*3/uL — ABNORMAL HIGH (ref 0.00–0.07)
Basophils Absolute: 0 10*3/uL (ref 0.0–0.1)
Basophils Relative: 0 %
Eosinophils Absolute: 0 10*3/uL (ref 0.0–0.5)
Eosinophils Relative: 0 %
HCT: 42.1 % (ref 39.0–52.0)
Hemoglobin: 14.2 g/dL (ref 13.0–17.0)
Immature Granulocytes: 6 %
Lymphocytes Relative: 5 %
Lymphs Abs: 1.2 10*3/uL (ref 0.7–4.0)
MCH: 35.4 pg — ABNORMAL HIGH (ref 26.0–34.0)
MCHC: 33.7 g/dL (ref 30.0–36.0)
MCV: 105 fL — ABNORMAL HIGH (ref 80.0–100.0)
Monocytes Absolute: 1.6 10*3/uL — ABNORMAL HIGH (ref 0.1–1.0)
Monocytes Relative: 6 %
Neutro Abs: 21 10*3/uL — ABNORMAL HIGH (ref 1.7–7.7)
Neutrophils Relative %: 83 %
Platelets: 395 10*3/uL (ref 150–400)
RBC: 4.01 MIL/uL — ABNORMAL LOW (ref 4.22–5.81)
RDW: 12.5 % (ref 11.5–15.5)
WBC: 25.5 10*3/uL — ABNORMAL HIGH (ref 4.0–10.5)
nRBC: 0 % (ref 0.0–0.2)

## 2021-01-05 LAB — URINALYSIS, COMPLETE (UACMP) WITH MICROSCOPIC
Bacteria, UA: NONE SEEN
Bilirubin Urine: NEGATIVE
Glucose, UA: >=500 mg/dL — AB
Ketones, ur: 5 mg/dL — AB
Nitrite: NEGATIVE
Protein, ur: NEGATIVE mg/dL
Specific Gravity, Urine: 1.017 (ref 1.005–1.030)
Squamous Epithelial / HPF: NONE SEEN (ref 0–5)
pH: 6 (ref 5.0–8.0)

## 2021-01-05 LAB — URINE DRUG SCREEN, QUALITATIVE (ARMC ONLY)
Amphetamines, Ur Screen: NOT DETECTED
Barbiturates, Ur Screen: NOT DETECTED
Benzodiazepine, Ur Scrn: NOT DETECTED
Cannabinoid 50 Ng, Ur ~~LOC~~: NOT DETECTED
Cocaine Metabolite,Ur ~~LOC~~: NOT DETECTED
MDMA (Ecstasy)Ur Screen: NOT DETECTED
Methadone Scn, Ur: NOT DETECTED
Opiate, Ur Screen: NOT DETECTED
Phencyclidine (PCP) Ur S: NOT DETECTED
Tricyclic, Ur Screen: NOT DETECTED

## 2021-01-05 LAB — COMPREHENSIVE METABOLIC PANEL
ALT: 17 U/L (ref 0–44)
AST: 18 U/L (ref 15–41)
Albumin: 3.4 g/dL — ABNORMAL LOW (ref 3.5–5.0)
Alkaline Phosphatase: 122 U/L (ref 38–126)
Anion gap: 17 — ABNORMAL HIGH (ref 5–15)
BUN: 58 mg/dL — ABNORMAL HIGH (ref 6–20)
CO2: 23 mmol/L (ref 22–32)
Calcium: 9.5 mg/dL (ref 8.9–10.3)
Chloride: 76 mmol/L — ABNORMAL LOW (ref 98–111)
Creatinine, Ser: 2.36 mg/dL — ABNORMAL HIGH (ref 0.61–1.24)
GFR, Estimated: 33 mL/min — ABNORMAL LOW (ref >60)
Glucose, Bld: 1431 mg/dL (ref 70–99)
Potassium: 5.4 mmol/L — ABNORMAL HIGH (ref 3.5–5.1)
Sodium: 116 mmol/L — CL (ref 135–145)
Total Bilirubin: 1.8 mg/dL — ABNORMAL HIGH (ref 0.3–1.2)
Total Protein: 8.3 g/dL — ABNORMAL HIGH (ref 6.5–8.1)

## 2021-01-05 LAB — RESP PANEL BY RT-PCR (FLU A&B, COVID) ARPGX2
Influenza A by PCR: NEGATIVE
Influenza B by PCR: NEGATIVE
SARS Coronavirus 2 by RT PCR: NEGATIVE

## 2021-01-05 LAB — SODIUM, URINE, RANDOM: Sodium, Ur: <10 mmol/L

## 2021-01-05 LAB — AMMONIA: Ammonia: 26 umol/L (ref 9–35)

## 2021-01-05 LAB — LACTIC ACID, PLASMA
Lactic Acid, Venous: 2.7 mmol/L (ref 0.5–1.9)
Lactic Acid, Venous: 2.4 mmol/L (ref 0.5–1.9)

## 2021-01-05 LAB — OSMOLALITY, URINE: Osmolality, Ur: 391 mOsm/kg (ref 300–900)

## 2021-01-05 LAB — PROCALCITONIN: Procalcitonin: 0.78 ng/mL

## 2021-01-05 LAB — ETHANOL: Alcohol, Ethyl (B): <10 mg/dL (ref <10)

## 2021-01-05 LAB — BETA-HYDROXYBUTYRIC ACID: Beta-Hydroxybutyric Acid: 1.46 mmol/L — ABNORMAL HIGH (ref 0.05–0.27)

## 2021-01-05 LAB — TROPONIN I (HIGH SENSITIVITY)
Troponin I (High Sensitivity): 16 ng/L (ref <18)
Troponin I (High Sensitivity): 14 ng/L (ref <18)

## 2021-01-05 LAB — OSMOLALITY: Osmolality: 354 mOsm/kg (ref 275–295)

## 2021-01-05 LAB — LIPASE, BLOOD: Lipase: 54 U/L — ABNORMAL HIGH (ref 11–51)

## 2021-01-05 MED ORDER — LACTATED RINGERS IV SOLN: INTRAVENOUS | Status: DC

## 2021-01-05 MED ORDER — LACTATED RINGERS IV BOLUS
500.0000 mL | Freq: Once | INTRAVENOUS | Status: AC
  Administered 2021-01-05: 500 mL via INTRAVENOUS

## 2021-01-05 MED ORDER — IOHEXOL 300 MG/ML  SOLN
75.0000 mL | Freq: Once | INTRAMUSCULAR | Status: AC | PRN
  Administered 2021-01-05: 50 mL via INTRAVENOUS

## 2021-01-05 MED ORDER — METRONIDAZOLE IN NACL 5-0.79 MG/ML-% IV SOLN
500.0000 mg | Freq: Once | INTRAVENOUS | Status: AC
  Administered 2021-01-05: 500 mg via INTRAVENOUS
  Filled 2021-01-05: qty 100

## 2021-01-05 MED ORDER — SODIUM CHLORIDE 0.9 % IV SOLN
2.0000 g | Freq: Once | INTRAVENOUS | Status: AC
  Administered 2021-01-05: 2 g via INTRAVENOUS
  Filled 2021-01-05: qty 2

## 2021-01-05 MED ORDER — ADULT MULTIVITAMIN W/MINERALS CH
1.0000 | ORAL_TABLET | Freq: Every day | ORAL | Status: DC
  Administered 2021-01-07 – 2021-01-16 (×10): 1 via ORAL
  Filled 2021-01-05 (×11): qty 1

## 2021-01-05 MED ORDER — DOCUSATE SODIUM 100 MG PO CAPS
100.0000 mg | ORAL_CAPSULE | Freq: Two times a day (BID) | ORAL | Status: DC | PRN
Start: 2021-01-05 — End: 2021-01-16

## 2021-01-05 MED ORDER — LACTATED RINGERS IV BOLUS
1000.0000 mL | Freq: Once | INTRAVENOUS | Status: AC
  Administered 2021-01-05: 1000 mL via INTRAVENOUS

## 2021-01-05 MED ORDER — HEPARIN SODIUM (PORCINE) 5000 UNIT/ML IJ SOLN
5000.0000 [IU] | Freq: Three times a day (TID) | INTRAMUSCULAR | Status: DC
  Administered 2021-01-06 – 2021-01-16 (×32): 5000 [IU] via SUBCUTANEOUS
  Filled 2021-01-05 (×31): qty 1

## 2021-01-05 MED ORDER — VANCOMYCIN HCL IN DEXTROSE 1-5 GM/200ML-% IV SOLN
1000.0000 mg | Freq: Once | INTRAVENOUS | Status: AC
  Administered 2021-01-05: 1000 mg via INTRAVENOUS
  Filled 2021-01-05: qty 200

## 2021-01-05 MED ORDER — CHLORHEXIDINE GLUCONATE CLOTH 2 % EX PADS
6.0000 | MEDICATED_PAD | Freq: Every day | CUTANEOUS | Status: DC
  Administered 2021-01-06 – 2021-01-15 (×9): 6 via TOPICAL
  Filled 2021-01-05: qty 6

## 2021-01-05 MED ORDER — ONDANSETRON HCL 4 MG/2ML IJ SOLN
4.0000 mg | Freq: Four times a day (QID) | INTRAMUSCULAR | Status: DC | PRN
  Administered 2021-01-07 – 2021-01-12 (×4): 4 mg via INTRAVENOUS
  Filled 2021-01-05 (×4): qty 2

## 2021-01-05 MED ORDER — LORAZEPAM 2 MG/ML IJ SOLN
1.0000 mg | Freq: Once | INTRAMUSCULAR | Status: AC
  Administered 2021-01-05: 1 mg via INTRAVENOUS

## 2021-01-05 MED ORDER — THIAMINE HCL 100 MG/ML IJ SOLN
500.0000 mg | Freq: Every day | INTRAVENOUS | Status: AC
  Administered 2021-01-06 – 2021-01-09 (×4): 500 mg via INTRAVENOUS
  Filled 2021-01-05 (×4): qty 5

## 2021-01-05 MED ORDER — LACTATED RINGERS IV BOLUS
500.0000 mL | Freq: Once | INTRAVENOUS | Status: AC
  Administered 2021-01-06: 500 mL via INTRAVENOUS

## 2021-01-05 MED ORDER — DEXMEDETOMIDINE HCL IN NACL 400 MCG/100ML IV SOLN
0.4000 ug/kg/h | INTRAVENOUS | Status: DC
  Administered 2021-01-06: 0.7 ug/kg/h via INTRAVENOUS
  Administered 2021-01-06: 0.4 ug/kg/h via INTRAVENOUS
  Administered 2021-01-06: 0.5 ug/kg/h via INTRAVENOUS
  Administered 2021-01-06: 0.6 ug/kg/h via INTRAVENOUS
  Filled 2021-01-05 (×5): qty 100

## 2021-01-05 MED ORDER — POLYETHYLENE GLYCOL 3350 17 G PO PACK: 17.0000 g | PACK | Freq: Every day | ORAL | Status: DC | PRN

## 2021-01-05 MED ORDER — ACETAMINOPHEN 325 MG PO TABS
650.0000 mg | ORAL_TABLET | ORAL | Status: DC | PRN
  Administered 2021-01-07 – 2021-01-16 (×8): 650 mg via ORAL
  Filled 2021-01-05 (×8): qty 2

## 2021-01-05 MED ORDER — DEXTROSE IN LACTATED RINGERS 5 % IV SOLN: INTRAVENOUS | Status: DC

## 2021-01-05 MED ORDER — DEXTROSE 50 % IV SOLN: 0.0000 mL | INTRAVENOUS | Status: DC | PRN

## 2021-01-05 MED ORDER — LORAZEPAM 2 MG/ML IJ SOLN
1.0000 mg | Freq: Once | INTRAMUSCULAR | Status: AC
  Administered 2021-01-05: 1 mg via INTRAVENOUS
  Filled 2021-01-05: qty 1

## 2021-01-05 MED ORDER — INSULIN REGULAR(HUMAN) IN NACL 100-0.9 UT/100ML-% IV SOLN
INTRAVENOUS | Status: DC
  Administered 2021-01-06: 1.5 [IU]/h via INTRAVENOUS
  Filled 2021-01-05: qty 100

## 2021-01-05 MED ORDER — SODIUM CHLORIDE 0.9 % IV SOLN
2.0000 g | Freq: Two times a day (BID) | INTRAVENOUS | Status: DC
  Administered 2021-01-06: 2 g via INTRAVENOUS
  Filled 2021-01-05 (×2): qty 2

## 2021-01-05 MED ORDER — ACETAMINOPHEN 650 MG RE SUPP
650.0000 mg | Freq: Once | RECTAL | Status: AC
  Administered 2021-01-05: 650 mg via RECTAL
  Filled 2021-01-05: qty 1

## 2021-01-05 MED ORDER — FOLIC ACID 5 MG/ML IJ SOLN
1.0000 mg | Freq: Every day | INTRAMUSCULAR | Status: DC
  Administered 2021-01-06 – 2021-01-09 (×4): 1 mg via INTRAVENOUS
  Filled 2021-01-05 (×4): qty 0.2

## 2021-01-05 NOTE — ED Notes (Signed)
Critical. Serum osmo is 354. Wallis Bamberg, MD notified.
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
## 2021-01-05 NOTE — ED Notes (Signed)
Critical result Lactic Acid 2.7

## 2021-01-05 NOTE — Progress Notes (Signed)
CODE SEPSIS - PHARMACY COMMUNICATION  **Broad Spectrum Antibiotics should be administered within 1 hour of Sepsis diagnosis**  Time Code Sepsis Called/Page Received: 2003  Antibiotics Ordered: cefepime/metronidazole/vancomycin  Time of 1st antibiotic administration: 2013     Pricilla Riffle ,PharmD Clinical Pharmacist  01/05/2021  8:17 PM

## 2021-01-05 NOTE — ED Triage Notes (Signed)
Pt to ED via EMS from home. Per EMS, pt parents stated that pt has had a severe change in mental status. Pt CBG 595 and pt is diabetic. EMS vitals; temp 101.6, sinus tach with HR in 120s, and BP wdl. Upon arrival is altered with bizzare affect.

## 2021-01-05 NOTE — Progress Notes (Signed)
Pharmacy Antibiotic Note  Kirkland Figg is a 50 y.o. male admitted on 01/05/2021 with sepsis.  Pharmacy has been consulted for Cefepime dosing.  Plan: Cefepime 2 gm IV X 1 given in ED on 3/31 @ 2000.  Cefepime 2 gm IV Q12H ordered to start on 04/01 @ 0800.   Height: 6' (182.9 cm) Weight: 81.6 kg (180 lb) IBW/kg (Calculated) : 77.6  Temp (24hrs), Avg:103.5 F (39.7 C), Min:103.5 F (39.7 C), Max:103.5 F (39.7 C)  Recent Labs  Lab 01/05/21 2001  WBC 25.5*  CREATININE 2.36*  LATICACIDVEN 2.7*    Estimated Creatinine Clearance: 41.6 mL/min (A) (by C-G formula based on SCr of 2.36 mg/dL (H)).    Allergies  Allergen Reactions  . Antihistamines, Chlorpheniramine-Type Anaphylaxis    Antimicrobials this admission:   >>    >>   Dose adjustments this admission:   Microbiology results:  BCx:   UCx:    Sputum:    MRSA PCR:   Thank you for allowing pharmacy to be a part of this patient's care.  Carr Shartzer D 01/05/2021 10:51 PM

## 2021-01-05 NOTE — Progress Notes (Signed)
PHARMACY -  BRIEF ANTIBIOTIC NOTE   Pharmacy has received consult(s) for vancomycin and cefepime from an ED provider.  The patient's profile has been reviewed for ht/wt/allergies/indication/available labs.    One time order(s) placed for vancomycin 1000 mg + cefepime 2 g  Further antibiotics/pharmacy consults should be ordered by admitting physician if indicated.                       Thank you,  Pricilla Riffle, PharmD 01/05/2021  8:18 PM

## 2021-01-06 DIAGNOSIS — A419 Sepsis, unspecified organism: Secondary | ICD-10-CM

## 2021-01-06 DIAGNOSIS — R6520 Severe sepsis without septic shock: Secondary | ICD-10-CM

## 2021-01-06 DIAGNOSIS — E1100 Type 2 diabetes mellitus with hyperosmolarity without nonketotic hyperglycemic-hyperosmolar coma (NKHHC): Secondary | ICD-10-CM

## 2021-01-06 DIAGNOSIS — E1165 Type 2 diabetes mellitus with hyperglycemia: Secondary | ICD-10-CM

## 2021-01-06 DIAGNOSIS — N179 Acute kidney failure, unspecified: Secondary | ICD-10-CM

## 2021-01-06 LAB — BLOOD CULTURE ID PANEL (REFLEXED) - BCID2

## 2021-01-06 LAB — BASIC METABOLIC PANEL
Anion gap: 12 (ref 5–15)
Anion gap: 12 (ref 5–15)
Anion gap: 10 (ref 5–15)
Anion gap: 10 (ref 5–15)
Anion gap: 8 (ref 5–15)
BUN: 54 mg/dL — ABNORMAL HIGH (ref 6–20)
BUN: 54 mg/dL — ABNORMAL HIGH (ref 6–20)
BUN: 52 mg/dL — ABNORMAL HIGH (ref 6–20)
BUN: 56 mg/dL — ABNORMAL HIGH (ref 6–20)
BUN: 56 mg/dL — ABNORMAL HIGH (ref 6–20)
CO2: 27 mmol/L (ref 22–32)
CO2: 29 mmol/L (ref 22–32)
CO2: 30 mmol/L (ref 22–32)
CO2: 29 mmol/L (ref 22–32)
CO2: 30 mmol/L (ref 22–32)
Calcium: 9.3 mg/dL (ref 8.9–10.3)
Calcium: 9.9 mg/dL (ref 8.9–10.3)
Calcium: 9.6 mg/dL (ref 8.9–10.3)
Calcium: 9.5 mg/dL (ref 8.9–10.3)
Calcium: 9.5 mg/dL (ref 8.9–10.3)
Chloride: 91 mmol/L — ABNORMAL LOW (ref 98–111)
Chloride: 103 mmol/L (ref 98–111)
Chloride: 107 mmol/L (ref 98–111)
Chloride: 111 mmol/L (ref 98–111)
Chloride: 114 mmol/L — ABNORMAL HIGH (ref 98–111)
Creatinine, Ser: 2.18 mg/dL — ABNORMAL HIGH (ref 0.61–1.24)
Creatinine, Ser: 2.2 mg/dL — ABNORMAL HIGH (ref 0.61–1.24)
Creatinine, Ser: 2.19 mg/dL — ABNORMAL HIGH (ref 0.61–1.24)
Creatinine, Ser: 1.88 mg/dL — ABNORMAL HIGH (ref 0.61–1.24)
Creatinine, Ser: 1.9 mg/dL — ABNORMAL HIGH (ref 0.61–1.24)
GFR, Estimated: 36 mL/min — ABNORMAL LOW (ref >60)
GFR, Estimated: 36 mL/min — ABNORMAL LOW (ref >60)
GFR, Estimated: 36 mL/min — ABNORMAL LOW (ref >60)
GFR, Estimated: 43 mL/min — ABNORMAL LOW (ref >60)
GFR, Estimated: 43 mL/min — ABNORMAL LOW (ref >60)
Glucose, Bld: 997 mg/dL (ref 70–99)
Glucose, Bld: 469 mg/dL — ABNORMAL HIGH (ref 70–99)
Glucose, Bld: 286 mg/dL — ABNORMAL HIGH (ref 70–99)
Glucose, Bld: 215 mg/dL — ABNORMAL HIGH (ref 70–99)
Glucose, Bld: 212 mg/dL — ABNORMAL HIGH (ref 70–99)
Potassium: 3.9 mmol/L (ref 3.5–5.1)
Potassium: 3.9 mmol/L (ref 3.5–5.1)
Potassium: 4.1 mmol/L (ref 3.5–5.1)
Potassium: 3.8 mmol/L (ref 3.5–5.1)
Potassium: 3.5 mmol/L (ref 3.5–5.1)
Sodium: 130 mmol/L — ABNORMAL LOW (ref 135–145)
Sodium: 144 mmol/L (ref 135–145)
Sodium: 147 mmol/L — ABNORMAL HIGH (ref 135–145)
Sodium: 150 mmol/L — ABNORMAL HIGH (ref 135–145)
Sodium: 152 mmol/L — ABNORMAL HIGH (ref 135–145)

## 2021-01-06 LAB — GLUCOSE, CAPILLARY
Glucose-Capillary: 117 mg/dL — ABNORMAL HIGH (ref 70–99)
Glucose-Capillary: 134 mg/dL — ABNORMAL HIGH (ref 70–99)
Glucose-Capillary: 135 mg/dL — ABNORMAL HIGH (ref 70–99)
Glucose-Capillary: 151 mg/dL — ABNORMAL HIGH (ref 70–99)
Glucose-Capillary: 160 mg/dL — ABNORMAL HIGH (ref 70–99)
Glucose-Capillary: 190 mg/dL — ABNORMAL HIGH (ref 70–99)
Glucose-Capillary: 204 mg/dL — ABNORMAL HIGH (ref 70–99)
Glucose-Capillary: 213 mg/dL — ABNORMAL HIGH (ref 70–99)
Glucose-Capillary: 215 mg/dL — ABNORMAL HIGH (ref 70–99)
Glucose-Capillary: 216 mg/dL — ABNORMAL HIGH (ref 70–99)
Glucose-Capillary: 219 mg/dL — ABNORMAL HIGH (ref 70–99)
Glucose-Capillary: 232 mg/dL — ABNORMAL HIGH (ref 70–99)
Glucose-Capillary: 260 mg/dL — ABNORMAL HIGH (ref 70–99)
Glucose-Capillary: 280 mg/dL — ABNORMAL HIGH (ref 70–99)
Glucose-Capillary: 285 mg/dL — ABNORMAL HIGH (ref 70–99)
Glucose-Capillary: 340 mg/dL — ABNORMAL HIGH (ref 70–99)
Glucose-Capillary: 360 mg/dL — ABNORMAL HIGH (ref 70–99)
Glucose-Capillary: 406 mg/dL — ABNORMAL HIGH (ref 70–99)
Glucose-Capillary: 422 mg/dL — ABNORMAL HIGH (ref 70–99)
Glucose-Capillary: 441 mg/dL — ABNORMAL HIGH (ref 70–99)
Glucose-Capillary: 518 mg/dL (ref 70–99)
Glucose-Capillary: 593 mg/dL (ref 70–99)
Glucose-Capillary: 593 mg/dL (ref 70–99)
Glucose-Capillary: >600 mg/dL (ref 70–99)
Glucose-Capillary: >600 mg/dL (ref 70–99)
Glucose-Capillary: >600 mg/dL (ref 70–99)
Glucose-Capillary: >600 mg/dL (ref 70–99)
Glucose-Capillary: >600 mg/dL (ref 70–99)
Glucose-Capillary: >600 mg/dL (ref 70–99)
Glucose-Capillary: >600 mg/dL (ref 70–99)
Glucose-Capillary: >600 mg/dL (ref 70–99)
Glucose-Capillary: >600 mg/dL (ref 70–99)
Glucose-Capillary: >600 mg/dL (ref 70–99)

## 2021-01-06 LAB — CBC
HCT: 35.5 % — ABNORMAL LOW (ref 39.0–52.0)
HCT: 37.8 % — ABNORMAL LOW (ref 39.0–52.0)
HCT: 35.2 % — ABNORMAL LOW (ref 39.0–52.0)
Hemoglobin: 13.1 g/dL (ref 13.0–17.0)
Hemoglobin: 13.8 g/dL (ref 13.0–17.0)
Hemoglobin: 12.5 g/dL — ABNORMAL LOW (ref 13.0–17.0)
MCH: 35.7 pg — ABNORMAL HIGH (ref 26.0–34.0)
MCH: 35 pg — ABNORMAL HIGH (ref 26.0–34.0)
MCH: 35 pg — ABNORMAL HIGH (ref 26.0–34.0)
MCHC: 36.9 g/dL — ABNORMAL HIGH (ref 30.0–36.0)
MCHC: 36.5 g/dL — ABNORMAL HIGH (ref 30.0–36.0)
MCHC: 35.5 g/dL (ref 30.0–36.0)
MCV: 96.7 fL (ref 80.0–100.0)
MCV: 95.9 fL (ref 80.0–100.0)
MCV: 98.6 fL (ref 80.0–100.0)
Platelets: 275 10*3/uL (ref 150–400)
Platelets: 302 10*3/uL (ref 150–400)
Platelets: 281 10*3/uL (ref 150–400)
RBC: 3.67 MIL/uL — ABNORMAL LOW (ref 4.22–5.81)
RBC: 3.94 MIL/uL — ABNORMAL LOW (ref 4.22–5.81)
RBC: 3.57 MIL/uL — ABNORMAL LOW (ref 4.22–5.81)
RDW: 11.9 % (ref 11.5–15.5)
RDW: 11.9 % (ref 11.5–15.5)
RDW: 12.1 % (ref 11.5–15.5)
WBC: 21 10*3/uL — ABNORMAL HIGH (ref 4.0–10.5)
WBC: 21.4 10*3/uL — ABNORMAL HIGH (ref 4.0–10.5)
WBC: 19.3 10*3/uL — ABNORMAL HIGH (ref 4.0–10.5)
nRBC: 0 % (ref 0.0–0.2)
nRBC: 0 % (ref 0.0–0.2)
nRBC: 0 % (ref 0.0–0.2)

## 2021-01-06 LAB — LACTIC ACID, PLASMA: Lactic Acid, Venous: 2.3 mmol/L (ref 0.5–1.9)

## 2021-01-06 LAB — MRSA PCR SCREENING: MRSA by PCR: POSITIVE — AB

## 2021-01-06 LAB — PHOSPHORUS
Phosphorus: 3.8 mg/dL (ref 2.5–4.6)
Phosphorus: 5.4 mg/dL — ABNORMAL HIGH (ref 2.5–4.6)
Phosphorus: 5.1 mg/dL — ABNORMAL HIGH (ref 2.5–4.6)
Phosphorus: 4.2 mg/dL (ref 2.5–4.6)

## 2021-01-06 LAB — BETA-HYDROXYBUTYRIC ACID: Beta-Hydroxybutyric Acid: 0.17 mmol/L (ref 0.05–0.27)

## 2021-01-06 LAB — MAGNESIUM
Magnesium: 2.4 mg/dL (ref 1.7–2.4)
Magnesium: 2.5 mg/dL — ABNORMAL HIGH (ref 1.7–2.4)
Magnesium: 3 mg/dL — ABNORMAL HIGH (ref 1.7–2.4)
Magnesium: 2.6 mg/dL — ABNORMAL HIGH (ref 1.7–2.4)

## 2021-01-06 LAB — OSMOLALITY: Osmolality: 346 mOsm/kg (ref 275–295)

## 2021-01-06 MED ORDER — GABAPENTIN 300 MG PO CAPS
600.0000 mg | ORAL_CAPSULE | Freq: Two times a day (BID) | ORAL | Status: DC
  Administered 2021-01-06 – 2021-01-16 (×20): 600 mg via ORAL
  Filled 2021-01-06 (×20): qty 2

## 2021-01-06 MED ORDER — MUPIROCIN 2 % EX OINT
1.0000 "application " | TOPICAL_OINTMENT | Freq: Two times a day (BID) | CUTANEOUS | Status: AC
  Administered 2021-01-06 – 2021-01-11 (×10): 1 via NASAL
  Filled 2021-01-06 (×2): qty 22

## 2021-01-06 MED ORDER — LORAZEPAM 1 MG PO TABS
1.0000 mg | ORAL_TABLET | ORAL | Status: DC | PRN
  Administered 2021-01-08: 3 mg via ORAL
  Administered 2021-01-08 (×3): 2 mg via ORAL
  Filled 2021-01-06: qty 3
  Filled 2021-01-06 (×3): qty 2

## 2021-01-06 MED ORDER — LORAZEPAM 2 MG/ML IJ SOLN
INTRAMUSCULAR | Status: AC
  Administered 2021-01-06: 1 mg via INTRAVENOUS
  Filled 2021-01-06: qty 1

## 2021-01-06 MED ORDER — LORAZEPAM 2 MG/ML IJ SOLN: 1.0000 mg | Freq: Once | INTRAMUSCULAR | Status: AC

## 2021-01-06 MED ORDER — CHLORHEXIDINE GLUCONATE CLOTH 2 % EX PADS: 6.0000 | MEDICATED_PAD | Freq: Every day | CUTANEOUS | Status: DC

## 2021-01-06 MED ORDER — LORAZEPAM 2 MG/ML IJ SOLN: 2.0000 mg | Freq: Once | INTRAMUSCULAR | Status: DC

## 2021-01-06 MED ORDER — DEXTROSE-NACL 5-0.45 % IV SOLN: INTRAVENOUS | Status: DC

## 2021-01-06 MED ORDER — LORAZEPAM 2 MG/ML IJ SOLN
1.0000 mg | INTRAMUSCULAR | Status: DC | PRN
  Administered 2021-01-08 (×2): 2 mg via INTRAVENOUS
  Administered 2021-01-08: 4 mg via INTRAVENOUS
  Filled 2021-01-06 (×3): qty 1
  Filled 2021-01-06 (×2): qty 2

## 2021-01-06 MED ORDER — POTASSIUM CHLORIDE 10 MEQ/100ML IV SOLN
10.0000 meq | INTRAVENOUS | Status: AC
  Administered 2021-01-06 – 2021-01-07 (×4): 10 meq via INTRAVENOUS
  Filled 2021-01-06 (×4): qty 100

## 2021-01-06 MED ORDER — SODIUM CHLORIDE 0.9 % IV SOLN
2.0000 g | INTRAVENOUS | Status: DC
  Administered 2021-01-06 – 2021-01-07 (×2): 2 g via INTRAVENOUS
  Filled 2021-01-06: qty 20
  Filled 2021-01-06: qty 2
  Filled 2021-01-06: qty 20

## 2021-01-06 NOTE — Progress Notes (Signed)
Initial Nutrition Assessment  DOCUMENTATION CODES:   Non-severe (moderate) malnutrition in context of chronic illness  INTERVENTION:   Once diet advances will supplement to support adequate nutrition.   If pt remains too lethargic recommend small bore feeding tube placement; pt would be at risk for refeeding syndrome and potassium, magnesium, and phosphorus would need to be monitored.  Glucerna 1.5 goal rate 60 ml/hr Provides: 2160 kcal, 118 grams protein, 1094 ml free water.    NUTRITION DIAGNOSIS:   Moderate Malnutrition related to chronic illness (uncontrolled DM) as evidenced by mild fat depletion,moderate fat depletion,mild muscle depletion,moderate muscle depletion.  GOAL:   Patient will meet greater than or equal to 90% of their needs   MONITOR:   Diet advancement,I & O's  REASON FOR ASSESSMENT:   Malnutrition Screening Tool    ASSESSMENT:   Pt with PMH of DM, HTN, Pancreatitis, anxiety, polysubstance abuse (crack, cocaine), and ETOH abuse admitted with acute metabolic encephalopathy, possible sepsis due to UTI and HHNK requiring insulin drip.    CBG 1431 on admission Pt last seen by RD 05/20/2020. DM education completed at that time.  Per chart review pt weighed 192 lb 8/21 and is now 175 lb, 9% weight loss x 8 months.  Pt asleep and does not wake during exam. RN and tech at bedside.  Per RN pt started on precedex drip due to agitation.   Medications reviewed and include: folic acid, MVI with minerals Insulin drip Thiamine  Labs reviewed: A1C: >15.5 (8/21) CBG's: 219-441    NUTRITION - FOCUSED PHYSICAL EXAM:  Flowsheet Row Most Recent Value  Orbital Region Mild depletion  Upper Arm Region Moderate depletion  Thoracic and Lumbar Region No depletion  Buccal Region Mild depletion  Temple Region Moderate depletion  Clavicle Bone Region Moderate depletion  Clavicle and Acromion Bone Region Moderate depletion  Scapular Bone Region Unable to assess   Dorsal Hand Unable to assess  Patellar Region Moderate depletion  Anterior Thigh Region Moderate depletion  Posterior Calf Region Mild depletion  Edema (RD Assessment) None  Hair Reviewed  Eyes Unable to assess  Mouth Unable to assess  Skin Reviewed  Nails Unable to assess       Diet Order:   Diet Order            Diet NPO time specified  Diet effective now                 EDUCATION NEEDS:   Not appropriate for education at this time  Skin:  Skin Assessment: Reviewed RN Assessment  Last BM:  unknown  Height:   Ht Readings from Last 1 Encounters:  01/05/21 6' (1.829 m)    Weight:   Wt Readings from Last 1 Encounters:  01/06/21 79.5 kg    Ideal Body Weight:  80.9 kg  BMI:  Body mass index is 23.77 kg/m.  Estimated Nutritional Needs:   Kcal:  2100-2300  Protein:  100-115 grams  Fluid:  >2 L/day  Cammy Copa., RD, LDN, CNSC See AMiON for contact information
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
## 2021-01-06 NOTE — Consult Note (Signed)
PHARMACY CONSULT NOTE - FOLLOW UP  Pharmacy Consult for Electrolyte Monitoring and Replacement   Recent Labs: Potassium (mmol/L)  Date Value  01/06/2021 3.9  08/18/2014 3.8   Magnesium (mg/dL)  Date Value  63/33/5456 3.0 (H)   Calcium (mg/dL)  Date Value  25/63/8937 9.9   Calcium, Total (mg/dL)  Date Value  34/28/7681 8.4 (L)   Albumin (g/dL)  Date Value  15/72/6203 3.4 (L)  08/18/2014 3.9   Phosphorus (mg/dL)  Date Value  55/97/4163 5.1 (H)   Sodium (mmol/L)  Date Value  01/06/2021 144  08/18/2014 142   Corrected Ca: 8.9  Assessment: 49 yo M presented with AMS. Pt found to have HHS and e. Coli bacteremia. PMH includes HTN, diabetes, alcohol abuse, polysubstance abuse. Pharmacy has been consulted for monitoring of electrolytes  Goal of Therapy:  Electrolytes WNL  Plan:  --No electrolyte replacement needed at this time --F/u with AM labs  Reatha Armour, PharmD Pharmacy Resident  01/06/2021 11:29 AM

## 2021-01-06 NOTE — TOC Progression Note (Signed)
Transition of Care [facility]) - Progression Note    Patient Details  Name: Andrew Buchanan MRN: [id number] Date of Birth: 1971/05/24  [facility]) CM/SW Contact  Marina Goodell Phone Number: [phone] 01/06/2021, 10:44 AM  Clinical Narrative:     CSW received message from [facility] requesting a speech therapy swallow evaluation for patient before he d/c. Patient is active with [facility].       Expected Discharge Plan and Services                                                 Social Determinants of Health (SDOH) Interventions    Readmission Risk Interventions No flowsheet data found.

## 2021-01-06 NOTE — Progress Notes (Signed)
PHARMACY - PHYSICIAN COMMUNICATION CRITICAL VALUE ALERT - BLOOD CULTURE IDENTIFICATION (BCID)  Andrew Buchanan is an 50 y.o. male who presented to [facility] on 01/05/2021 with a chief complaint of bacteremia.  Assessment: BCx 1/2 sets e. Coli   Name of physician (or Provider) Contacted: Harlon Ditty  Current antibiotics: cefepime 2g IV q24h  Changes to prescribed antibiotics recommended:  Recommendations accepted by provider - d/c cefepime, start ceftriaxone 2g IV q24h  Results for orders placed or performed during the hospital encounter of 01/05/21  Blood Culture ID Panel (Reflexed) (Collected: 01/05/2021  8:01 PM)  Result Value Ref Range   Enterococcus faecalis NOT DETECTED NOT DETECTED   Enterococcus Faecium NOT DETECTED NOT DETECTED   Listeria monocytogenes NOT DETECTED NOT DETECTED   Staphylococcus species NOT DETECTED NOT DETECTED   Staphylococcus aureus (BCID) NOT DETECTED NOT DETECTED   Staphylococcus epidermidis NOT DETECTED NOT DETECTED   Staphylococcus lugdunensis NOT DETECTED NOT DETECTED   Streptococcus species NOT DETECTED NOT DETECTED   Streptococcus agalactiae NOT DETECTED NOT DETECTED   Streptococcus pneumoniae NOT DETECTED NOT DETECTED   Streptococcus pyogenes NOT DETECTED NOT DETECTED   A.calcoaceticus-baumannii NOT DETECTED NOT DETECTED   Bacteroides fragilis NOT DETECTED NOT DETECTED   Enterobacterales DETECTED (A) NOT DETECTED   Enterobacter cloacae complex NOT DETECTED NOT DETECTED   Escherichia coli DETECTED (A) NOT DETECTED   Klebsiella aerogenes NOT DETECTED NOT DETECTED   Klebsiella oxytoca NOT DETECTED NOT DETECTED   Klebsiella pneumoniae NOT DETECTED NOT DETECTED   Proteus species NOT DETECTED NOT DETECTED   Salmonella species NOT DETECTED NOT DETECTED   Serratia marcescens NOT DETECTED NOT DETECTED   Haemophilus influenzae NOT DETECTED NOT DETECTED   Neisseria meningitidis NOT DETECTED NOT DETECTED   Pseudomonas aeruginosa NOT DETECTED  NOT DETECTED   Stenotrophomonas maltophilia NOT DETECTED NOT DETECTED   Candida albicans NOT DETECTED NOT DETECTED   Candida auris NOT DETECTED NOT DETECTED   Candida glabrata NOT DETECTED NOT DETECTED   Candida krusei NOT DETECTED NOT DETECTED   Candida parapsilosis NOT DETECTED NOT DETECTED   Candida tropicalis NOT DETECTED NOT DETECTED   Cryptococcus neoformans/gattii NOT DETECTED NOT DETECTED   CTX-M ESBL NOT DETECTED NOT DETECTED   Carbapenem resistance IMP NOT DETECTED NOT DETECTED   Carbapenem resistance KPC NOT DETECTED NOT DETECTED   Carbapenem resistance NDM NOT DETECTED NOT DETECTED   Carbapenem resist OXA 48 LIKE NOT DETECTED NOT DETECTED   Carbapenem resistance VIM NOT DETECTED NOT DETECTED    Reatha Armour 01/06/2021  11:13 AM

## 2021-01-06 NOTE — Progress Notes (Signed)
Patient had a copious amount of incontinent urine noted. Urine was yellow in color with no odor noted.

## 2021-01-07 DIAGNOSIS — F10231 Alcohol dependence with withdrawal delirium: Secondary | ICD-10-CM

## 2021-01-07 DIAGNOSIS — E440 Moderate protein-calorie malnutrition: Secondary | ICD-10-CM | POA: Insufficient documentation

## 2021-01-07 DIAGNOSIS — R4182 Altered mental status, unspecified: Secondary | ICD-10-CM

## 2021-01-07 DIAGNOSIS — F1010 Alcohol abuse, uncomplicated: Secondary | ICD-10-CM

## 2021-01-07 LAB — CBC
HCT: 37.1 % — ABNORMAL LOW (ref 39.0–52.0)
Hemoglobin: 12.4 g/dL — ABNORMAL LOW (ref 13.0–17.0)
MCH: 35 pg — ABNORMAL HIGH (ref 26.0–34.0)
MCHC: 33.4 g/dL (ref 30.0–36.0)
MCV: 104.8 fL — ABNORMAL HIGH (ref 80.0–100.0)
Platelets: 240 10*3/uL (ref 150–400)
RBC: 3.54 MIL/uL — ABNORMAL LOW (ref 4.22–5.81)
RDW: 12.4 % (ref 11.5–15.5)
WBC: 16.9 10*3/uL — ABNORMAL HIGH (ref 4.0–10.5)
nRBC: 0 % (ref 0.0–0.2)

## 2021-01-07 LAB — URINE CULTURE

## 2021-01-07 LAB — RAPID HIV SCREEN (HIV 1/2 AB+AG)
HIV 1/2 Antibodies: NONREACTIVE
HIV-1 P24 Antigen - HIV24: NONREACTIVE

## 2021-01-07 LAB — GLUCOSE, CAPILLARY
Glucose-Capillary: 138 mg/dL — ABNORMAL HIGH (ref 70–99)
Glucose-Capillary: 147 mg/dL — ABNORMAL HIGH (ref 70–99)
Glucose-Capillary: 167 mg/dL — ABNORMAL HIGH (ref 70–99)
Glucose-Capillary: 196 mg/dL — ABNORMAL HIGH (ref 70–99)
Glucose-Capillary: 207 mg/dL — ABNORMAL HIGH (ref 70–99)
Glucose-Capillary: 222 mg/dL — ABNORMAL HIGH (ref 70–99)
Glucose-Capillary: 226 mg/dL — ABNORMAL HIGH (ref 70–99)
Glucose-Capillary: 291 mg/dL — ABNORMAL HIGH (ref 70–99)
Glucose-Capillary: 335 mg/dL — ABNORMAL HIGH (ref 70–99)
Glucose-Capillary: 356 mg/dL — ABNORMAL HIGH (ref 70–99)
Glucose-Capillary: 493 mg/dL — ABNORMAL HIGH (ref 70–99)
Glucose-Capillary: 493 mg/dL — ABNORMAL HIGH (ref 70–99)
Glucose-Capillary: 529 mg/dL (ref 70–99)

## 2021-01-07 LAB — BASIC METABOLIC PANEL
Anion gap: 9 (ref 5–15)
BUN: 54 mg/dL — ABNORMAL HIGH (ref 6–20)
CO2: 23 mmol/L (ref 22–32)
Calcium: 7.4 mg/dL — ABNORMAL LOW (ref 8.9–10.3)
Chloride: 101 mmol/L (ref 98–111)
Creatinine, Ser: 1.69 mg/dL — ABNORMAL HIGH (ref 0.61–1.24)
GFR, Estimated: 49 mL/min — ABNORMAL LOW (ref >60)
Glucose, Bld: 632 mg/dL (ref 70–99)
Potassium: 3.8 mmol/L (ref 3.5–5.1)
Sodium: 133 mmol/L — ABNORMAL LOW (ref 135–145)

## 2021-01-07 LAB — COMPREHENSIVE METABOLIC PANEL
ALT: 15 U/L (ref 0–44)
AST: 43 U/L — ABNORMAL HIGH (ref 15–41)
Albumin: 2.5 g/dL — ABNORMAL LOW (ref 3.5–5.0)
Alkaline Phosphatase: 70 U/L (ref 38–126)
Anion gap: 11 (ref 5–15)
BUN: 53 mg/dL — ABNORMAL HIGH (ref 6–20)
CO2: 27 mmol/L (ref 22–32)
Calcium: 8.8 mg/dL — ABNORMAL LOW (ref 8.9–10.3)
Chloride: 110 mmol/L (ref 98–111)
Creatinine, Ser: 1.84 mg/dL — ABNORMAL HIGH (ref 0.61–1.24)
GFR, Estimated: 44 mL/min — ABNORMAL LOW (ref >60)
Glucose, Bld: 233 mg/dL — ABNORMAL HIGH (ref 70–99)
Potassium: 3.7 mmol/L (ref 3.5–5.1)
Sodium: 148 mmol/L — ABNORMAL HIGH (ref 135–145)
Total Bilirubin: 0.7 mg/dL (ref 0.3–1.2)
Total Protein: 6.7 g/dL (ref 6.5–8.1)

## 2021-01-07 LAB — PHOSPHORUS: Phosphorus: 3.4 mg/dL (ref 2.5–4.6)

## 2021-01-07 LAB — MAGNESIUM: Magnesium: 2.4 mg/dL (ref 1.7–2.4)

## 2021-01-07 LAB — PROCALCITONIN: Procalcitonin: 0.52 ng/mL

## 2021-01-07 MED ORDER — SODIUM CHLORIDE 0.9 % IV SOLN
25.0000 mg | Freq: Once | INTRAVENOUS | Status: DC
  Filled 2021-01-07: qty 1

## 2021-01-07 MED ORDER — INSULIN ASPART 100 UNIT/ML ~~LOC~~ SOLN
0.0000 [IU] | SUBCUTANEOUS | Status: DC
  Administered 2021-01-07: 15 [IU] via SUBCUTANEOUS
  Filled 2021-01-07: qty 1

## 2021-01-07 MED ORDER — TRAZODONE HCL 50 MG PO TABS
50.0000 mg | ORAL_TABLET | Freq: Every evening | ORAL | Status: DC | PRN
Start: 2021-01-07 — End: 2021-01-13
  Administered 2021-01-08 (×2): 50 mg via ORAL
  Filled 2021-01-07 (×2): qty 1

## 2021-01-07 MED ORDER — PANTOPRAZOLE SODIUM 40 MG PO TBEC
40.0000 mg | DELAYED_RELEASE_TABLET | Freq: Every day | ORAL | Status: DC
  Administered 2021-01-07 – 2021-01-16 (×10): 40 mg via ORAL
  Filled 2021-01-07 (×11): qty 1

## 2021-01-07 MED ORDER — INSULIN ASPART 100 UNIT/ML ~~LOC~~ SOLN
0.0000 [IU] | SUBCUTANEOUS | Status: DC
  Administered 2021-01-07: 20 [IU] via SUBCUTANEOUS
  Administered 2021-01-07: 11 [IU] via SUBCUTANEOUS
  Administered 2021-01-07: 20 [IU] via SUBCUTANEOUS
  Administered 2021-01-08: 7 [IU] via SUBCUTANEOUS
  Administered 2021-01-08: 3 [IU] via SUBCUTANEOUS
  Administered 2021-01-08: 15 [IU] via SUBCUTANEOUS
  Administered 2021-01-08: 20 [IU] via SUBCUTANEOUS
  Administered 2021-01-08: 11 [IU] via SUBCUTANEOUS
  Administered 2021-01-09: 5 [IU] via SUBCUTANEOUS
  Administered 2021-01-09: 11 [IU] via SUBCUTANEOUS
  Administered 2021-01-09: 4 [IU] via SUBCUTANEOUS
  Administered 2021-01-09: 7 [IU] via SUBCUTANEOUS
  Administered 2021-01-09: 15 [IU] via SUBCUTANEOUS
  Administered 2021-01-09: 4 [IU] via SUBCUTANEOUS
  Administered 2021-01-10: 20 [IU] via SUBCUTANEOUS
  Administered 2021-01-10: 15 [IU] via SUBCUTANEOUS
  Administered 2021-01-10: 3 [IU] via SUBCUTANEOUS
  Administered 2021-01-10 (×2): 15 [IU] via SUBCUTANEOUS
  Administered 2021-01-11: 4 [IU] via SUBCUTANEOUS
  Administered 2021-01-11: 20 [IU] via SUBCUTANEOUS
  Administered 2021-01-11: 7 [IU] via SUBCUTANEOUS
  Administered 2021-01-11: 4 [IU] via SUBCUTANEOUS
  Administered 2021-01-11: 11 [IU] via SUBCUTANEOUS
  Administered 2021-01-12: 7 [IU] via SUBCUTANEOUS
  Administered 2021-01-12: 20 [IU] via SUBCUTANEOUS
  Filled 2021-01-07 (×25): qty 1

## 2021-01-07 MED ORDER — DIAZEPAM 5 MG PO TABS
5.0000 mg | ORAL_TABLET | Freq: Four times a day (QID) | ORAL | Status: DC
  Administered 2021-01-07 – 2021-01-09 (×9): 5 mg via ORAL
  Filled 2021-01-07: qty 1
  Filled 2021-01-07 (×2): qty 3
  Filled 2021-01-07 (×2): qty 1
  Filled 2021-01-07 (×2): qty 3
  Filled 2021-01-07 (×3): qty 1

## 2021-01-07 MED ORDER — INSULIN ASPART 100 UNIT/ML ~~LOC~~ SOLN
5.0000 [IU] | Freq: Once | SUBCUTANEOUS | Status: AC
  Administered 2021-01-07: 5 [IU] via SUBCUTANEOUS
  Filled 2021-01-07: qty 1

## 2021-01-07 MED ORDER — INSULIN GLARGINE 100 UNIT/ML ~~LOC~~ SOLN
28.0000 [IU] | SUBCUTANEOUS | Status: DC
  Administered 2021-01-07: 28 [IU] via SUBCUTANEOUS
  Filled 2021-01-07 (×2): qty 0.28

## 2021-01-07 MED ORDER — SODIUM CHLORIDE 0.9 % IV BOLUS: 1000.0000 mL | Freq: Once | INTRAVENOUS | Status: DC

## 2021-01-07 MED ORDER — LACTATED RINGERS IV BOLUS
1000.0000 mL | Freq: Once | INTRAVENOUS | Status: AC
  Administered 2021-01-07: 1000 mL via INTRAVENOUS

## 2021-01-07 MED ORDER — INSULIN ASPART 100 UNIT/ML ~~LOC~~ SOLN
0.0000 [IU] | Freq: Three times a day (TID) | SUBCUTANEOUS | Status: DC
  Administered 2021-01-07: 2 [IU] via SUBCUTANEOUS
  Filled 2021-01-07: qty 1

## 2021-01-07 MED ORDER — QUETIAPINE FUMARATE 25 MG PO TABS: 25.0000 mg | ORAL_TABLET | Freq: Every day | ORAL | Status: DC

## 2021-01-07 NOTE — Consult Note (Addendum)
PHARMACY CONSULT NOTE - FOLLOW UP  Pharmacy Consult for Electrolyte Monitoring and Replacement   Recent Labs: Potassium (mmol/L)  Date Value  01/07/2021 3.7  08/18/2014 3.8   Magnesium (mg/dL)  Date Value  82/50/0370 2.4   Calcium (mg/dL)  Date Value  48/88/9169 8.8 (L)   Calcium, Total (mg/dL)  Date Value  45/12/8880 8.4 (L)   Albumin (g/dL)  Date Value  80/12/4915 2.5 (L)  08/18/2014 3.9   Phosphorus (mg/dL)  Date Value  91/50/5697 3.4   Sodium (mmol/L)  Date Value  01/07/2021 148 (H)  08/18/2014 142   Corrected Ca: 10  Assessment: 50 yo M presented with AMS. Pt found to have HHS and e. Coli bacteremia. PMH includes HTN, diabetes, alcohol abuse, polysubstance abuse.  Hyperglycemic on admission (resolving)  Currently hypernatremic (Na 148)  Pharmacy has been consulted for monitoring of electrolytes  Goal of Therapy:  Electrolytes WNL  Plan:  --No electrolyte replacement needed at this time --F/u with AM labs  Albina Billet, PharmD, BCPS Clinical Pharmacist 01/07/2021 9:18 AM

## 2021-01-07 NOTE — Progress Notes (Signed)
Met patient and daughter Andrew Buchanan bedside. Patient became emotional, stating he did not want to die. He allowed me to pray for him. I will request follow up for emotional support.

## 2021-01-07 NOTE — Progress Notes (Addendum)
NAME:  Andrew Buchanan, MRN:  [id number], DOB:  [birth year], LOS: 2 ADMISSION DATE:  01/05/2021, CONSULTATION DATE:  01/05/2021 REFERRING MD:  Dr. Larinda Buttery, CHIEF COMPLAINT:  Altered Mental Status  Brief History of Present Illness:  50 year old male with significant PMH as below who pr presented to [facility] ED on 01/05/2021 with altered mental status.  Patient was found to be in HHNK, therefore insulin drip initiatedDue to concern of sepsis pt received 2.5L LR bolus, vancomycin, flagyl, and cefepime.  CT Head/Cervical Spine negative for acute abnormality. CT Abd Pelvis also negative for acute abnormalities. Respiratory Panel by RT-PCR negative, however CXR concerning for RUL atelectasis. Patient was admitted under PCCM service.  Pertinent  Medical History  Type II Diabetes Mellitus HTN Pancreatitis Anxiety Polysubstance Abuse (Crack Cocaine) ETOH Abuse  Significant Hospital Events: Including procedures, antibiotic start and stop dates in addition to other pertinent events    03/31: Pt admitted to the stepdown unit per PCCM team with HHNK requiring insulin gtt  4/2: Transitioned off insulin gtt  Cultures:  331/2022: SARS-CoV-2 PCR>>negative 01/06/2021: MRSA PCR>>Positive 01/05/2021: BCx>> Ecoli positive 01/05/2021: Urine Cx> pending   Antimicrobials:  3/31:Cefepime 2 gm IV Q12H x 1 (Cefepime 2 gm IV X 1 given in ED on 3/31 @ 2000)  4/1:Ceftriaxone 2g IV q24h  Interim History / Subjective:  Patient awake , alert and oriented this morning. Still requiring a sitter at the bedside. On precedex drip and CIWA per protocol.  Objective   Blood pressure 90/61, pulse (!) 48, temperature 99 F (37.2 C), temperature source Oral, resp. rate 12, height 6' (1.829 m), weight 79.5 kg, SpO2 94 %.        Intake/Output Summary (Last 24 hours) at 01/07/2021 0958 Last data filed at 01/07/2021 0600 Gross per 24 hour  Intake 1968.84 ml  Output 1825 ml  Net 143.84 ml   Filed Weights   01/05/21 2003  01/06/21 0131  Weight: 81.6 kg 79.5 kg    Examination: GENERAL: 50 year old patient lying in the bed with no acute distress.  EYES: Pupils equal, round, reactive to light and accommodation. No scleral icterus. Extraocular muscles intact.  HEENT: Head atraumatic, normocephalic. Oropharynx and nasopharynx clear.  NECK:  Supple, no jugular venous distention. No thyroid enlargement, no tenderness.  LUNGS: Normal breath sounds bilaterally, no wheezing, rales,rhonchi or crepitation. No use of accessory muscles of respiration.  CARDIOVASCULAR: S1, S2 normal. No murmurs, rubs, or gallops.  ABDOMEN: Soft, nontender, nondistended. Bowel sounds present. No organomegaly or mass.  EXTREMITIES: No pedal edema, cyanosis, or clubbing.  NEUROLOGIC: Cranial nerves II through XII are intact. Except speech is mildly garbled but comprehensible. Muscle strength 5/5 in all extremities. Sensation intact. Gait not checked.  PSYCHIATRIC: The patient is alert and oriented x 3.  SKIN: No obvious rash, lesion, or ulcer.  LABORATORY PANEL:    Labs/imaging that I havepersonally reviewed  (right click and "Reselect all SmartList Selections" daily)  03/31: CT Head Cervical Spine negative for acute abnormality  03/31: CT Abd Pelvis revealed no evidence of acute abnormalities within the abdomen or pelvis. Diffuse colonic diverticulosis without evidence of diverticulitis. Aortic atherosclerosis.  Resolved Hospital Problem list   HHNK  Assessment & Plan:   Sepsis secondary to Ecoli Bacteremia Blood cultures 1/2 sets + Ecoli -Continue with Ceftriaxone -Monitor WBC and fever curve  Non Ketoacidosis  Hyperglycemic Hyperosmolar Syndrome  # Initially treated with Insulin (regular)drip per DKA protocol now has been transitioned to subq insulin. -Lab monitoring: BMP+Phosphorus+Mag  -Continue long  acting insulin +SSI -Discontinue IV fluids and start carb-controlled diet -CBGs -Sliding scale insulin -Follow ICU  hyper/hypoglycemia protocol -Hold home Metformin    Acute renal failure with hyperkalemia secondary to ATN in setting of HHNK Pseudohyponatremia and hypochloridemia secondary to hyperglycemia  Lactic acidosis now improved with treatment of Hyperglycemia -Trend BMP and lactic acid -Replace electrolytes as indicated  -Monitor UOP -Avoid nephrotoxic medications   Acute encephalopathy secondary to metabolic derangements and possible infectious process complicated by known hx of ETOH abuse  Hx: Polysubstance abuse and Anxiety  -Frequent reorientation -Continue precedex gtt for ETOH withdrawal symptoms, wean as tolerated. Will start Valium 5mg  q6 hours -Check CMP, INR, Daily BMP+Mg -Daily Thiamine, Folate, MVI once tolerating PO -SW consult for cessation resources -PT/OT evaluation for mobility   Best practice (right click and "Reselect all SmartList Selections" daily)  Diet:  Oral Pain/Anxiety/Delirium protocol (if indicated): Yes (RASS goal 0) VAP protocol (if indicated): Not indicated DVT prophylaxis: Subcutaneous Heparin GI prophylaxis: PPI Glucose control:  SSI Yes Central venous access:  N/A Arterial line:  N/A Foley:  N/A Mobility:  bed rest  PT consulted: Yes Last date of multidisciplinary goals of care discussion [01/06/2021] Code Status:  full code Disposition: Stepdown  Labs   CBC: Recent Labs  Lab 01/05/21 2001 01/06/21 0030 01/06/21 0627 01/06/21 1127 01/07/21 0500  WBC 25.5* 21.0* 21.4* 19.3* 16.9*  NEUTROABS 21.0*  --   --   --   --   HGB 14.2 13.1 13.8 12.5* 12.4*  HCT 42.1 35.5* 37.8* 35.2* 37.1*  MCV 105.0* 96.7 95.9 98.6 104.8*  PLT 395 275 302 281 240    Basic Metabolic Panel: Recent Labs  Lab 01/05/21 2001 01/06/21 0030 01/06/21 0627 01/06/21 1127 01/06/21 1541 01/06/21 2052 01/07/21 0500  NA 116* 130* 144 147* 150* 152* 148*  K 5.4* 3.9 3.9 4.1 3.8 3.5 3.7  CL 76* 91* 103 107 111 114* 110  CO2 23 27 29 30 29 30 27   GLUCOSE 1,431* 997*  469* 286* 215* 212* 233*  BUN 58* 54* 54* 52* 56* 56* 53*  CREATININE 2.36* 2.18* 2.20* 2.19* 1.88* 1.90* 1.84*  CALCIUM 9.5 9.3 9.9 9.6 9.5 9.5 8.8*  MG 2.5* 2.4 3.0* 2.6*  --   --  2.4  PHOS 3.8 5.4* 5.1* 4.2  --   --  3.4   GFR: Estimated Creatinine Clearance: 53.3 mL/min (A) (by C-G formula based on SCr of 1.84 mg/dL (H)). Recent Labs  Lab 01/05/21 2001 01/05/21 2225 01/06/21 0030 01/06/21 0627 01/06/21 1127 01/07/21 0500  PROCALCITON 0.78  --   --   --   --  0.52  WBC 25.5*  --  21.0* 21.4* 19.3* 16.9*  LATICACIDVEN 2.7* 2.4*  --  2.3*  --   --     Liver Function Tests: Recent Labs  Lab 01/05/21 2001 01/07/21 0500  AST 18 43*  ALT 17 15  ALKPHOS 122 70  BILITOT 1.8* 0.7  PROT 8.3* 6.7  ALBUMIN 3.4* 2.5*   Recent Labs  Lab 01/05/21 2001  LIPASE 54*   Recent Labs  Lab 01/05/21 2001  AMMONIA 26    ABG    Component Value Date/Time   HCO3 25.9 01/05/2021 2001   O2SAT 89.8 01/05/2021 2001     Coagulation Profile: No results for input(s): INR, PROTIME in the last 168 hours.  Cardiac Enzymes: No results for input(s): CKTOTAL, CKMB, CKMBINDEX, TROPONINI in the last 168 hours.  HbA1C: Hgb A1c MFr Bld  Date/Time  Value Ref Range Status  05/19/2020 11:50 AM >15.5 (H) 4.8 - 5.6 % Final    Comment:    (NOTE) **Verified by repeat analysis**         Prediabetes: 5.7 - 6.4         Diabetes: >6.4         Glycemic control for adults with diabetes: <7.0     CBG: Recent Labs  Lab 01/07/21 0153 01/07/21 0328 01/07/21 0458 01/07/21 0626 01/07/21 0733  GLUCAP 207* 167* 226* 222* 196*    Past Medical History:  He,  has a past medical history of Alcohol abuse, Anxiety, Diabetes mellitus without complication (HCC), Hypertension, and Pancreatitis.   Surgical History:  No past surgical history on file.   Social History:   reports that he has never smoked. He has never used smokeless tobacco. He reports current alcohol use.   Family History:  His  family history is not on file.   Allergies Allergies  Allergen Reactions  . Antihistamines, Chlorpheniramine-Type Anaphylaxis     Home Medications  Prior to Admission medications   Medication Sig Start Date End Date Taking? Authorizing Provider  Cholecalciferol (VITAMIN D3) 1.25 MG (50000 UT) CAPS Take 50,000 Units by mouth every 7 (seven) days.    [provider]  gabapentin (NEURONTIN) 300 MG capsule Take 2 capsules (600 mg total) by mouth 2 (two) times daily. 05/20/20   Enedina Finner, MD  insulin aspart (NOVOLOG) 100 UNIT/ML injection Inject 6 Units into the skin 3 (three) times daily with meals. 05/20/20   Enedina Finner, MD  insulin glargine (LANTUS) 100 UNIT/ML injection Inject 0.35 mLs (35 Units total) into the skin daily. 05/21/20   Enedina Finner, MD  losartan (COZAAR) 50 MG tablet Take 1 tablet (50 mg total) by mouth daily. 05/20/20   Enedina Finner, MD  metFORMIN (GLUCOPHAGE) 1000 MG tablet Take 1 tablet (1,000 mg total) by mouth 2 (two) times daily with a meal. 05/20/20   Enedina Finner, MD  Multiple Vitamin (MULTIVITAMIN WITH MINERALS) TABS tablet Take 1 tablet by mouth daily. 05/21/20   Enedina Finner, MD     Critical care time: 58       Webb Silversmith DNP, FNP-BC, AGACNP-BC [place] [facility] [place] [facility]  PCCM ATTENDING ATTESTATION:  I have evaluated patient with the APP, I personally  reviewed database in its entirety and discussed care plan in detail. In addition, this patient was discussed on multidisciplinary rounds.   I agree with assessment and plan.  Septic shock -use vasopressors to keep MAP>65 as needed E coli sepsis off pressors  SEVERE ALCOHOL WITHDRAWAL -Therapy with Thiamine and MVI -CIWA Protocol -Precedex as needed -High risk for intubation  -high risk for aspiration  Remains on precedex Started valium    Critical Care Time devoted to patient care services described in this note is 45 minutes.    Overall, patient is critically ill, prognosis is guarded.    Lucie Leather, M.D.  [facility]  Medical Director [facility] Medical Director [facility] Cardio-Pulmonary Department

## 2021-01-07 NOTE — Progress Notes (Signed)
Patient FSBS improved More alert and awake, started oral valium Weaned off precedex. Not on pressors Not on vent, not on oxygen  Can transfer to [facility] tomorrow     Wallis Bamberg Santiago Glad, M.D.  [facility]  Medical Director [facility] Medical Director [facility] Cardio-Pulmonary Department

## 2021-01-08 ENCOUNTER — Encounter: Payer: Self-pay | Admitting: Internal Medicine

## 2021-01-08 DIAGNOSIS — N179 Acute kidney failure, unspecified: Secondary | ICD-10-CM | POA: Diagnosis present

## 2021-01-08 DIAGNOSIS — A4151 Sepsis due to Escherichia coli [E. coli]: Principal | ICD-10-CM

## 2021-01-08 HISTORY — DX: Acute kidney failure, unspecified: N17.9

## 2021-01-08 LAB — BASIC METABOLIC PANEL
Anion gap: 9 (ref 5–15)
BUN: 37 mg/dL — ABNORMAL HIGH (ref 6–20)
CO2: 29 mmol/L (ref 22–32)
Calcium: 8.3 mg/dL — ABNORMAL LOW (ref 8.9–10.3)
Chloride: 102 mmol/L (ref 98–111)
Creatinine, Ser: 1.55 mg/dL — ABNORMAL HIGH (ref 0.61–1.24)
GFR, Estimated: 55 mL/min — ABNORMAL LOW (ref >60)
Glucose, Bld: 150 mg/dL — ABNORMAL HIGH (ref 70–99)
Potassium: 3.5 mmol/L (ref 3.5–5.1)
Sodium: 140 mmol/L (ref 135–145)

## 2021-01-08 LAB — CULTURE, BLOOD (ROUTINE X 2)

## 2021-01-08 LAB — GLUCOSE, CAPILLARY
Glucose-Capillary: 227 mg/dL — ABNORMAL HIGH (ref 70–99)
Glucose-Capillary: 121 mg/dL — ABNORMAL HIGH (ref 70–99)
Glucose-Capillary: 314 mg/dL — ABNORMAL HIGH (ref 70–99)
Glucose-Capillary: 286 mg/dL — ABNORMAL HIGH (ref 70–99)
Glucose-Capillary: 413 mg/dL — ABNORMAL HIGH (ref 70–99)
Glucose-Capillary: 185 mg/dL — ABNORMAL HIGH (ref 70–99)

## 2021-01-08 LAB — CBC WITH DIFFERENTIAL/PLATELET
Abs Immature Granulocytes: 0.69 10*3/uL — ABNORMAL HIGH (ref 0.00–0.07)
Basophils Absolute: 0.1 10*3/uL (ref 0.0–0.1)
Basophils Relative: 1 %
Eosinophils Absolute: 0.1 10*3/uL (ref 0.0–0.5)
Eosinophils Relative: 1 %
HCT: 38.8 % — ABNORMAL LOW (ref 39.0–52.0)
Hemoglobin: 13.1 g/dL (ref 13.0–17.0)
Immature Granulocytes: 4 %
Lymphocytes Relative: 14 %
Lymphs Abs: 2.5 10*3/uL (ref 0.7–4.0)
MCH: 34.9 pg — ABNORMAL HIGH (ref 26.0–34.0)
MCHC: 33.8 g/dL (ref 30.0–36.0)
MCV: 103.5 fL — ABNORMAL HIGH (ref 80.0–100.0)
Monocytes Absolute: 0.5 10*3/uL (ref 0.1–1.0)
Monocytes Relative: 3 %
Neutro Abs: 13.8 10*3/uL — ABNORMAL HIGH (ref 1.7–7.7)
Neutrophils Relative %: 77 %
Platelets: 286 10*3/uL (ref 150–400)
RBC: 3.75 MIL/uL — ABNORMAL LOW (ref 4.22–5.81)
RDW: 12.2 % (ref 11.5–15.5)
WBC: 17.7 10*3/uL — ABNORMAL HIGH (ref 4.0–10.5)
nRBC: 0 % (ref 0.0–0.2)

## 2021-01-08 LAB — PROCALCITONIN: Procalcitonin: 0.29 ng/mL

## 2021-01-08 LAB — MAGNESIUM: Magnesium: 2 mg/dL (ref 1.7–2.4)

## 2021-01-08 LAB — GLUCOSE, RANDOM: Glucose, Bld: 303 mg/dL — ABNORMAL HIGH (ref 70–99)

## 2021-01-08 LAB — RPR: RPR Ser Ql: NONREACTIVE

## 2021-01-08 LAB — PHOSPHORUS: Phosphorus: 2.2 mg/dL — ABNORMAL LOW (ref 2.5–4.6)

## 2021-01-08 MED ORDER — K PHOS MONO-SOD PHOS DI & MONO 155-852-130 MG PO TABS
250.0000 mg | ORAL_TABLET | Freq: Two times a day (BID) | ORAL | Status: AC
  Administered 2021-01-08 (×2): 250 mg via ORAL
  Filled 2021-01-08 (×2): qty 1

## 2021-01-08 MED ORDER — INSULIN GLARGINE 100 UNIT/ML ~~LOC~~ SOLN
30.0000 [IU] | SUBCUTANEOUS | Status: DC
  Administered 2021-01-08 – 2021-01-09 (×2): 30 [IU] via SUBCUTANEOUS
  Filled 2021-01-08 (×2): qty 0.3

## 2021-01-08 MED ORDER — INSULIN ASPART 100 UNIT/ML ~~LOC~~ SOLN
5.0000 [IU] | SUBCUTANEOUS | Status: DC
  Administered 2021-01-08 – 2021-01-09 (×9): 5 [IU] via SUBCUTANEOUS
  Filled 2021-01-08 (×6): qty 1

## 2021-01-08 MED ORDER — CEFAZOLIN SODIUM-DEXTROSE 2-4 GM/100ML-% IV SOLN
2.0000 g | Freq: Three times a day (TID) | INTRAVENOUS | Status: AC
  Administered 2021-01-08 – 2021-01-15 (×21): 2 g via INTRAVENOUS
  Filled 2021-01-08 (×22): qty 100

## 2021-01-08 MED ORDER — LACTATED RINGERS IV SOLN: INTRAVENOUS | Status: DC

## 2021-01-08 MED ORDER — POTASSIUM CHLORIDE CRYS ER 20 MEQ PO TBCR
40.0000 meq | EXTENDED_RELEASE_TABLET | Freq: Once | ORAL | Status: AC
  Administered 2021-01-08: 40 meq via ORAL
  Filled 2021-01-08: qty 2

## 2021-01-08 NOTE — Progress Notes (Signed)
Glucose was 413. 20uint  Insuline + routine 5units for a total of 25units administered. CIWA was 23 as well 4mg  ativan administered. Now resting. House coverage provider Dr. made aware. Will continue to monitor and endorse.

## 2021-01-08 NOTE — Plan of Care (Signed)
  Problem: Education: Goal: Knowledge of General Education information will improve Description: Including pain rating scale, medication(s)/side effects and non-pharmacologic comfort measures Outcome: Progressing   Problem: Clinical Measurements: Goal: Ability to maintain clinical measurements within normal limits will improve Outcome: Progressing Goal: Respiratory complications will improve Outcome: Progressing Goal: Cardiovascular complication will be avoided Outcome: Progressing   Problem: Nutrition: Goal: Adequate nutrition will be maintained Outcome: Progressing   

## 2021-01-08 NOTE — Progress Notes (Addendum)
Progress Note    Andrew Buchanan  MCN:[id number] DOB: 1971/03/04  DOA: 01/05/2021 PCP: Patient, No Pcp Per (Inactive)      Brief Narrative:    Medical records reviewed and are as summarized below:  Andrew Buchanan is a 50 y.o. male with medical history significant for type II DM, hypertension, pancreatitis, anxiety, alcohol use disorder, cocaine abuse, who was brought to the hospital because of altered mental status.  He was found to have hyperosmolar hyperglycemic state, severe sepsis and AKI.  He was admitted to the ICU for further management.  Additional work-up revealed E. coli bacteremia.  He was treated with IV insulin infusion, IV fluids and empiric IV antibiotics.  He was transferred to the hospitalist service on 01/08/2021      Assessment/Plan:   Principal Problem:   Sepsis with acute renal failure without septic shock (HCC) Active Problems:   Hyperosmolar hyperglycemic state (HHS) (HCC)   Malnutrition of moderate degree   AKI (acute kidney injury) (HCC)   Nutrition Problem: Moderate Malnutrition Etiology: chronic illness (uncontrolled DM)  Signs/Symptoms: mild fat depletion,moderate fat depletion,mild muscle depletion,moderate muscle depletion   Body mass index is 24.55 kg/m.    Type II DM complicated by hyperosmolar hyperglycemic state: He is off of IV insulin infusion.  Continue Lantus and NovoLog.  Hemoglobin A1c is pending.  Severe sepsis secondary to E. coli bacteremia: Switch IV Rocephin to IV cefazolin.  Urine culture showed multiple species.  AKI: Continue IV fluids  Blurred vision: Patient has been advised to follow-up with ophthalmologist for a thorough eye exam.  He was informed for glucose control can cause blurred vision.  Alcohol use disorder: Counseled to quit alcohol.  Continue diazepam and Ativan as needed per CIWA protocol.  Cocaine abuse: Counseled to quit.  Medical nonadherence: The importance of medical adherence was  reiterated.  Hyponatremia and hyperkalemia: resolved  Diet Order            Diet Carb Modified Fluid consistency: Thin; Room service appropriate? Yes  Diet effective now                    Consultants:  Intensivist  Procedures:  None    Medications:   . Chlorhexidine Gluconate Cloth  6 each Topical Daily  . diazepam  5 mg Oral Q6H  . folic acid  1 mg Intravenous Daily  . gabapentin  600 mg Oral BID  . heparin  5,000 Units Subcutaneous Q8H  . insulin aspart  0-20 Units Subcutaneous Q4H  . insulin aspart  5 Units Subcutaneous Q4H  . insulin glargine  30 Units Subcutaneous Q24H  . multivitamin with minerals  1 tablet Oral Daily  . mupirocin ointment  1 application Nasal BID  . pantoprazole  40 mg Oral Daily  . phosphorus  250 mg Oral BID   Continuous Infusions: .  ceFAZolin (ANCEF) IV    . dexmedetomidine (PRECEDEX) IV infusion Stopped (01/07/21 1219)  . lactated ringers 75 mL/hr at 01/08/21 1319  . thiamine injection 500 mg (01/08/21 0957)     Anti-infectives (From admission, onward)   Start     Dose/Rate Route Frequency Ordered Stop   01/08/21 2200  ceFAZolin (ANCEF) IVPB 2g/100 mL premix        2 g 200 mL/hr over 30 Minutes Intravenous Every 8 hours 01/08/21 1004     01/06/21 2200  cefTRIAXone (ROCEPHIN) 2 g in sodium chloride 0.9 % 100 mL IVPB  Status:  Discontinued  2 g 200 mL/hr over 30 Minutes Intravenous Every 24 hours 01/06/21 1113 01/08/21 1004   01/06/21 0800  ceFEPIme (MAXIPIME) 2 g in sodium chloride 0.9 % 100 mL IVPB  Status:  Discontinued        2 g 200 mL/hr over 30 Minutes Intravenous Every 12 hours 01/05/21 2251 01/06/21 1112   01/05/21 2015  ceFEPIme (MAXIPIME) 2 g in sodium chloride 0.9 % 100 mL IVPB        2 g 200 mL/hr over 30 Minutes Intravenous  Once 01/05/21 2003 01/05/21 2043   01/05/21 2015  metroNIDAZOLE (FLAGYL) IVPB 500 mg        500 mg 100 mL/hr over 60 Minutes Intravenous  Once 01/05/21 2003 01/05/21 2147   01/05/21  2015  vancomycin (VANCOCIN) IVPB 1000 mg/200 mL premix        1,000 mg 200 mL/hr over 60 Minutes Intravenous  Once 01/05/21 2003 01/05/21 2336             Family Communication/Anticipated D/C date and plan/Code Status   DVT prophylaxis: heparin injection 5,000 Units Start: 01/05/21 2245 SCDs Start: 01/05/21 2234     Code Status: Full Code  Family Communication: None Disposition Plan:    Status is: Inpatient  Remains inpatient appropriate because:IV treatments appropriate due to intensity of illness or inability to take PO and Inpatient level of care appropriate due to severity of illness   Dispo: The patient is from: Home              Anticipated d/c is to: Home              Patient currently is not medically stable to d/c.   Difficult to place patient No           Subjective:   C/o blurred vision.  Sitter at the bedside.  Objective:    Vitals:   01/08/21 1100 01/08/21 1200 01/08/21 1300 01/08/21 1400  BP: 127/81 134/84 (!) 138/100 133/85  Pulse: 98 97 (!) 107 89  Resp: (!) Temp:      TempSrc:      SpO2: 97% 99% 98% 96%  Weight:      Height:       No data found.   Intake/Output Summary (Last 24 hours) at 01/08/2021 1501 Last data filed at 01/08/2021 1155 Gross per 24 hour  Intake 1889.11 ml  Output 5165 ml  Net -3275.89 ml   Filed Weights   01/05/21 2003 01/06/21 0131 01/08/21 0500  Weight: 81.6 kg 79.5 kg 82.1 kg    Exam:  GEN: NAD SKIN: Wound on forehead EYES: EOMI. He is able to count fingers but he said he cannot see distant objects very clearly. ENT: MMM CV: RRR PULM: CTA B ABD: soft, ND, NT, +BS CNS: AAO x 3, non focal EXT: No edema or tenderness        Data Reviewed:   I have personally reviewed following labs and imaging studies:  Labs: Labs show the following:   Basic Metabolic Panel: Recent Labs  Lab 01/06/21 0030 01/06/21 0627 01/06/21 1127 01/06/21 1541 01/06/21 2052 01/07/21 0500  01/07/21 1145 01/08/21 0521  NA 130* 144 147* 150* 152* 148* 133* 140  K 3.9 3.9 4.1 3.8 3.5 3.7 3.8 3.5  CL 91* 103 107 111 114* 110 101 102  CO2 GLUCOSE 997* 469* 286* 215* 212* 233* 632* 150*  BUN 54* 54*  52* 56* 56* 53* 54* 37*  CREATININE 2.18* 2.20* 2.19* 1.88* 1.90* 1.84* 1.69* 1.55*  CALCIUM 9.3 9.9 9.6 9.5 9.5 8.8* 7.4* 8.3*  MG 2.4 3.0* 2.6*  --   --  2.4  --  2.0  PHOS 5.4* 5.1* 4.2  --   --  3.4  --  2.2*   GFR Estimated Creatinine Clearance: 63.3 mL/min (A) (by C-G formula based on SCr of 1.55 mg/dL (H)). Liver Function Tests: Recent Labs  Lab 01/05/21 2001 01/07/21 0500  AST 18 43*  ALT 17 15  ALKPHOS 122 70  BILITOT 1.8* 0.7  PROT 8.3* 6.7  ALBUMIN 3.4* 2.5*   Recent Labs  Lab 01/05/21 2001  LIPASE 54*   Recent Labs  Lab 01/05/21 2001  AMMONIA 26   Coagulation profile No results for input(s): INR, PROTIME in the last 168 hours.  CBC: Recent Labs  Lab 01/05/21 2001 01/06/21 0030 01/06/21 0627 01/06/21 1127 01/07/21 0500 01/08/21 0521  WBC 25.5* 21.0* 21.4* 19.3* 16.9* 17.7*  NEUTROABS 21.0*  --   --   --   --  13.8*  HGB 14.2 13.1 13.8 12.5* 12.4* 13.1  HCT 42.1 35.5* 37.8* 35.2* 37.1* 38.8*  MCV 105.0* 96.7 95.9 98.6 104.8* 103.5*  PLT 395 275 302 281 240 286   Cardiac Enzymes: No results for input(s): CKTOTAL, CKMB, CKMBINDEX, TROPONINI in the last 168 hours. BNP (last 3 results) No results for input(s): PROBNP in the last 8760 hours. CBG: Recent Labs  Lab 01/07/21 2130 01/07/21 2351 01/08/21 0409 01/08/21 0742 01/08/21 1116  GLUCAP 335* 291* 227* 121* 314*   D-Dimer: No results for input(s): DDIMER in the last 72 hours. Hgb A1c: No results for input(s): HGBA1C in the last 72 hours. Lipid Profile: No results for input(s): CHOL, HDL, LDLCALC, TRIG, CHOLHDL, LDLDIRECT in the last 72 hours. Thyroid function studies: No results for input(s): TSH, T4TOTAL, T3FREE, THYROIDAB in the last 72  hours.  Invalid input(s): FREET3 Anemia work up: No results for input(s): VITAMINB12, FOLATE, FERRITIN, TIBC, IRON, RETICCTPCT in the last 72 hours. Sepsis Labs: Recent Labs  Lab 01/05/21 2001 01/05/21 2225 01/06/21 0030 01/06/21 0627 01/06/21 1127 01/07/21 0500 01/08/21 0521  PROCALCITON 0.78  --   --   --   --  0.52 0.29  WBC 25.5*  --    < > 21.4* 19.3* 16.9* 17.7*  LATICACIDVEN 2.7* 2.4*  --  2.3*  --   --   --    < > = values in this interval not displayed.    Microbiology Recent Results (from the past 240 hour(s))  Culture, blood (routine x 2)     Status: Abnormal   Collection Time: 01/05/21  8:01 PM   Specimen: BLOOD  Result Value Ref Range Status   Specimen Description   Final    BLOOD BLOOD RIGHT FOREARM Performed at [facility], [address] [place], [place] [address]    Special Requests   Final    BOTTLES DRAWN AEROBIC AND ANAEROBIC Blood Culture results may not be optimal due to an excessive volume of blood received in culture bottles Performed at [facility], [address] [place], [place] [address]    Culture  Setup Time   Final    Organism ID to follow GRAM NEGATIVE RODS IN BOTH AEROBIC AND ANAEROBIC BOTTLES CRITICAL RESULT CALLED TO, READ BACK BY AND VERIFIED WITH: LISA KLUTTZ AT 1046 01/06/21 SDR Performed at [facility], [address]  [place], [place] [address]    Culture ESCHERICHIA COLI (A)  Final   Report Status 01/08/2021 FINAL  Final   Organism ID, Bacteria ESCHERICHIA COLI  Final      Susceptibility   Escherichia coli - MIC*    AMPICILLIN >=32 RESISTANT Resistant     CEFAZOLIN <=4 SENSITIVE Sensitive     CEFEPIME <=0.12 SENSITIVE Sensitive     CEFTAZIDIME <=1 SENSITIVE Sensitive     CEFTRIAXONE <=0.25 SENSITIVE Sensitive     CIPROFLOXACIN <=0.25 SENSITIVE Sensitive     GENTAMICIN <=1 SENSITIVE Sensitive     IMIPENEM <=0.25 SENSITIVE Sensitive     TRIMETH/SULFA <=20 SENSITIVE Sensitive      AMPICILLIN/SULBACTAM >=32 RESISTANT Resistant     PIP/TAZO <=4 SENSITIVE Sensitive     * ESCHERICHIA COLI  Culture, blood (routine x 2)     Status: None (Preliminary result)   Collection Time: 01/05/21  8:01 PM   Specimen: BLOOD  Result Value Ref Range Status   Specimen Description BLOOD BLOOD LEFT FOREARM  Final   Special Requests   Final    BOTTLES DRAWN AEROBIC AND ANAEROBIC Blood Culture results may not be optimal due to an inadequate volume of blood received in culture bottles   Culture   Final    NO GROWTH 3 DAYS Performed at [facility], [address] [place], [place] [address]    Report Status PENDING  Incomplete  Blood Culture ID Panel (Reflexed)     Status: Abnormal   Collection Time: 01/05/21  8:01 PM  Result Value Ref Range Status   Enterococcus faecalis NOT DETECTED NOT DETECTED Final   Enterococcus Faecium NOT DETECTED NOT DETECTED Final   Listeria monocytogenes NOT DETECTED NOT DETECTED Final   Staphylococcus species NOT DETECTED NOT DETECTED Final   Staphylococcus aureus (BCID) NOT DETECTED NOT DETECTED Final   Staphylococcus epidermidis NOT DETECTED NOT DETECTED Final   Staphylococcus lugdunensis NOT DETECTED NOT DETECTED Final   Streptococcus species NOT DETECTED NOT DETECTED Final   Streptococcus agalactiae NOT DETECTED NOT DETECTED Final   Streptococcus pneumoniae NOT DETECTED NOT DETECTED Final   Streptococcus pyogenes NOT DETECTED NOT DETECTED Final   A.calcoaceticus-baumannii NOT DETECTED NOT DETECTED Final   Bacteroides fragilis NOT DETECTED NOT DETECTED Final   Enterobacterales DETECTED (A) NOT DETECTED Final    Comment: Enterobacterales represent a large order of gram negative bacteria, not a single organism. CRITICAL RESULT CALLED TO, READ BACK BY AND VERIFIED WITH:  LISA KLUTTZ AT 1046 01/06/21 SDR    Enterobacter cloacae complex NOT DETECTED NOT DETECTED Final   Escherichia coli DETECTED (A) NOT DETECTED Final    Comment: CRITICAL RESULT  CALLED TO, READ BACK BY AND VERIFIED WITH:  LISA KLUTTZ AT 1046 01/06/21 SDR    Klebsiella aerogenes NOT DETECTED NOT DETECTED Final   Klebsiella oxytoca NOT DETECTED NOT DETECTED Final   Klebsiella pneumoniae NOT DETECTED NOT DETECTED Final   Proteus species NOT DETECTED NOT DETECTED Final   Salmonella species NOT DETECTED NOT DETECTED Final   Serratia marcescens NOT DETECTED NOT DETECTED Final   Haemophilus influenzae NOT DETECTED NOT DETECTED Final   Neisseria meningitidis NOT DETECTED NOT DETECTED Final   Pseudomonas aeruginosa NOT DETECTED NOT DETECTED Final   Stenotrophomonas maltophilia NOT DETECTED NOT DETECTED Final   Candida albicans NOT DETECTED NOT DETECTED Final   Candida auris NOT DETECTED NOT DETECTED Final   Candida glabrata NOT DETECTED NOT DETECTED Final   Candida krusei NOT DETECTED NOT DETECTED Final  Candida parapsilosis NOT DETECTED NOT DETECTED Final   Candida tropicalis NOT DETECTED NOT DETECTED Final   Cryptococcus neoformans/gattii NOT DETECTED NOT DETECTED Final   CTX-M ESBL NOT DETECTED NOT DETECTED Final   Carbapenem resistance IMP NOT DETECTED NOT DETECTED Final   Carbapenem resistance KPC NOT DETECTED NOT DETECTED Final   Carbapenem resistance NDM NOT DETECTED NOT DETECTED Final   Carbapenem resist OXA 48 LIKE NOT DETECTED NOT DETECTED Final   Carbapenem resistance VIM NOT DETECTED NOT DETECTED Final    Comment: Performed at [facility], [address] [place], [place] [address]  Resp Panel by RT-PCR (Flu A&B, Covid) Nasopharyngeal Swab     Status: None   Collection Time: 01/05/21  8:10 PM   Specimen: Nasopharyngeal Swab; Nasopharyngeal(NP) swabs in vial transport medium  Result Value Ref Range Status   SARS Coronavirus 2 by RT PCR NEGATIVE NEGATIVE Final    Comment: (NOTE) SARS-CoV-2 target nucleic acids are NOT DETECTED.  The SARS-CoV-2 RNA is generally detectable in upper respiratory specimens during the acute phase of infection. The  lowest concentration of SARS-CoV-2 viral copies this assay can detect is 138 copies/mL. A negative result does not preclude SARS-Cov-2 infection and should not be used as the sole basis for treatment or other patient management decisions. A negative result may occur with  improper specimen collection/handling, submission of specimen other than nasopharyngeal swab, presence of viral mutation(s) within the areas targeted by this assay, and inadequate number of viral copies(<138 copies/mL). A negative result must be combined with clinical observations, patient history, and epidemiological information. The expected result is Negative.  Fact Sheet for Patients:  BloggerCourse.com  Fact Sheet for Healthcare Providers:  SeriousBroker.it  This test is no t yet approved or cleared by the [place] FDA and  has been authorized for detection and/or diagnosis of SARS-CoV-2 by FDA under an Emergency Use Authorization (EUA). This EUA will remain  in effect (meaning this test can be used) for the duration of the COVID-19 declaration under Section 564(b)(1) of the Act, 21 U.S.C.section 360bbb-3(b)(1), unless the authorization is terminated  or revoked sooner.       Influenza A by PCR NEGATIVE NEGATIVE Final   Influenza B by PCR NEGATIVE NEGATIVE Final    Comment: (NOTE) The Xpert Xpress SARS-CoV-2/FLU/RSV plus assay is intended as an aid in the diagnosis of influenza from Nasopharyngeal swab specimens and should not be used as a sole basis for treatment. Nasal washings and aspirates are unacceptable for Xpert Xpress SARS-CoV-2/FLU/RSV testing.  Fact Sheet for Patients: BloggerCourse.com  Fact Sheet for Healthcare Providers: SeriousBroker.it  This test is not yet approved or cleared by the [place] FDA and has been authorized for detection and/or diagnosis of SARS-CoV-2 by FDA under  an Emergency Use Authorization (EUA). This EUA will remain in effect (meaning this test can be used) for the duration of the COVID-19 declaration under Section 564(b)(1) of the Act, 21 U.S.C. section 360bbb-3(b)(1), unless the authorization is terminated or revoked.  Performed at [facility], [address] [place], [place] [address]   Urine culture     Status: Abnormal   Collection Time: 01/05/21  9:10 PM   Specimen: In/Out Cath Urine  Result Value Ref Range Status   Specimen Description   Final    IN/OUT CATH URINE Performed at [facility], [address] [place], [place] [address]    Special Requests   Final    NONE Performed at [facility], [phone]  [address] [place], [place] [address]    Culture MULTIPLE SPECIES PRESENT, SUGGEST RECOLLECTION (A)  Final   Report Status 01/07/2021 FINAL  Final  MRSA PCR Screening     Status: Abnormal   Collection Time: 01/06/21  5:00 AM   Specimen: Nasopharyngeal  Result Value Ref Range Status   MRSA by PCR POSITIVE (A) NEGATIVE Final    Comment:        The GeneXpert MRSA Assay (FDA approved for NASAL specimens only), is one component of a comprehensive MRSA colonization surveillance program. It is not intended to diagnose MRSA infection nor to guide or monitor treatment for MRSA infections. RESULT CALLED TO, READ BACK BY AND VERIFIED WITH:  [facility] AT 0710 01/06/21 SDR Performed at [facility], [address] [place], [place] [address]     Procedures and diagnostic studies:  No results found.             LOS: 3 days   Basim Bartnik  [facility]   Pager on [email or web address]. If 7PM-7AM, please contact night-coverage at [email or web address]     01/08/2021, 3:01 PM

## 2021-01-08 NOTE — Consult Note (Addendum)
PHARMACY CONSULT NOTE - FOLLOW UP  Pharmacy Consult for Electrolyte Monitoring and Replacement   Recent Labs: Potassium (mmol/L)  Date Value  01/08/2021 3.5  08/18/2014 3.8   Magnesium (mg/dL)  Date Value  72/62/0355 2.0   Calcium (mg/dL)  Date Value  97/41/6384 8.3 (L)   Calcium, Total (mg/dL)  Date Value  53/64/6803 8.4 (L)   Albumin (g/dL)  Date Value  21/22/4825 2.5 (L)  08/18/2014 3.9   Phosphorus (mg/dL)  Date Value  00/37/0488 2.2 (L)   Sodium (mmol/L)  Date Value  01/08/2021 140  08/18/2014 142   Corrected Ca: 9.5  Assessment: 50 yo M presented with AMS. Pt found to have HHS and e. Coli bacteremia. PMH includes HTN, diabetes, alcohol abuse, polysubstance abuse.  Hyperglycemic on admission (resolving)  Scr improving 1.69>1.55  Pharmacy has been consulted for monitoring of electrolytes  Goal of Therapy:  Electrolytes WNL  Plan:  --K 3.5 - will give po x 1  --Phos 2.2 - will give K phos tab x 2  --No further electrolyte replacement needed at this time --F/u with AM labs  Albina Billet, PharmD, BCPS Clinical Pharmacist 01/08/2021 9:13 AM

## 2021-01-09 DIAGNOSIS — E440 Moderate protein-calorie malnutrition: Secondary | ICD-10-CM

## 2021-01-09 LAB — GLUCOSE, CAPILLARY
Glucose-Capillary: 134 mg/dL — ABNORMAL HIGH (ref 70–99)
Glucose-Capillary: 153 mg/dL — ABNORMAL HIGH (ref 70–99)
Glucose-Capillary: 156 mg/dL — ABNORMAL HIGH (ref 70–99)
Glucose-Capillary: 176 mg/dL — ABNORMAL HIGH (ref 70–99)
Glucose-Capillary: 207 mg/dL — ABNORMAL HIGH (ref 70–99)
Glucose-Capillary: 274 mg/dL — ABNORMAL HIGH (ref 70–99)
Glucose-Capillary: 327 mg/dL — ABNORMAL HIGH (ref 70–99)

## 2021-01-09 LAB — CBC WITH DIFFERENTIAL/PLATELET
Abs Immature Granulocytes: 0.83 10*3/uL — ABNORMAL HIGH (ref 0.00–0.07)
Basophils Absolute: 0.1 10*3/uL (ref 0.0–0.1)
Basophils Relative: 1 %
Eosinophils Absolute: 0.1 10*3/uL (ref 0.0–0.5)
Eosinophils Relative: 1 %
HCT: 34.9 % — ABNORMAL LOW (ref 39.0–52.0)
Hemoglobin: 11.3 g/dL — ABNORMAL LOW (ref 13.0–17.0)
Immature Granulocytes: 7 %
Lymphocytes Relative: 15 %
Lymphs Abs: 1.8 10*3/uL (ref 0.7–4.0)
MCH: 34.6 pg — ABNORMAL HIGH (ref 26.0–34.0)
MCHC: 32.4 g/dL (ref 30.0–36.0)
MCV: 106.7 fL — ABNORMAL HIGH (ref 80.0–100.0)
Monocytes Absolute: 0.5 10*3/uL (ref 0.1–1.0)
Monocytes Relative: 4 %
Neutro Abs: 9 10*3/uL — ABNORMAL HIGH (ref 1.7–7.7)
Neutrophils Relative %: 72 %
Platelets: 216 10*3/uL (ref 150–400)
RBC: 3.27 MIL/uL — ABNORMAL LOW (ref 4.22–5.81)
RDW: 12.4 % (ref 11.5–15.5)
Smear Review: NORMAL
WBC: 12.3 10*3/uL — ABNORMAL HIGH (ref 4.0–10.5)
nRBC: 0 % (ref 0.0–0.2)

## 2021-01-09 LAB — PHOSPHORUS: Phosphorus: 2.2 mg/dL — ABNORMAL LOW (ref 2.5–4.6)

## 2021-01-09 LAB — HEMOGLOBIN A1C
Hgb A1c MFr Bld: >15.5 % — ABNORMAL HIGH (ref 4.8–5.6)
Mean Plasma Glucose: >398 mg/dL

## 2021-01-09 LAB — BASIC METABOLIC PANEL
Anion gap: 8 (ref 5–15)
BUN: 21 mg/dL — ABNORMAL HIGH (ref 6–20)
CO2: 28 mmol/L (ref 22–32)
Calcium: 8.4 mg/dL — ABNORMAL LOW (ref 8.9–10.3)
Chloride: 110 mmol/L (ref 98–111)
Creatinine, Ser: 1.32 mg/dL — ABNORMAL HIGH (ref 0.61–1.24)
GFR, Estimated: >60 mL/min (ref >60)
Glucose, Bld: 155 mg/dL — ABNORMAL HIGH (ref 70–99)
Potassium: 3.5 mmol/L (ref 3.5–5.1)
Sodium: 146 mmol/L — ABNORMAL HIGH (ref 135–145)

## 2021-01-09 LAB — PATHOLOGIST SMEAR REVIEW

## 2021-01-09 LAB — MAGNESIUM: Magnesium: 2 mg/dL (ref 1.7–2.4)

## 2021-01-09 MED ORDER — INSULIN GLARGINE 100 UNIT/ML ~~LOC~~ SOLN
35.0000 [IU] | Freq: Every day | SUBCUTANEOUS | Status: DC
  Administered 2021-01-10: 35 [IU] via SUBCUTANEOUS
  Filled 2021-01-09: qty 0.35

## 2021-01-09 MED ORDER — K PHOS MONO-SOD PHOS DI & MONO 155-852-130 MG PO TABS
500.0000 mg | ORAL_TABLET | Freq: Three times a day (TID) | ORAL | Status: AC
  Administered 2021-01-09 (×2): 500 mg via ORAL
  Filled 2021-01-09 (×2): qty 2

## 2021-01-09 MED ORDER — HALOPERIDOL LACTATE 5 MG/ML IJ SOLN
5.0000 mg | Freq: Four times a day (QID) | INTRAMUSCULAR | Status: DC | PRN
  Administered 2021-01-09 – 2021-01-13 (×5): 5 mg via INTRAMUSCULAR
  Filled 2021-01-09 (×6): qty 1

## 2021-01-09 MED ORDER — LORAZEPAM 1 MG PO TABS: 1.0000 mg | ORAL_TABLET | ORAL | Status: DC | PRN

## 2021-01-09 MED ORDER — LORAZEPAM 2 MG/ML IJ SOLN
1.0000 mg | INTRAMUSCULAR | Status: DC | PRN
  Administered 2021-01-09: 2 mg via INTRAVENOUS

## 2021-01-09 MED ORDER — LORAZEPAM 2 MG/ML IJ SOLN: 1.0000 mg | INTRAMUSCULAR | Status: AC | PRN

## 2021-01-09 MED ORDER — LORAZEPAM 2 MG/ML IJ SOLN
INTRAMUSCULAR | Status: AC
  Filled 2021-01-09: qty 1

## 2021-01-09 MED ORDER — INSULIN ASPART 100 UNIT/ML ~~LOC~~ SOLN
8.0000 [IU] | Freq: Three times a day (TID) | SUBCUTANEOUS | Status: DC
  Administered 2021-01-09 – 2021-01-10 (×3): 8 [IU] via SUBCUTANEOUS
  Filled 2021-01-09 (×3): qty 1

## 2021-01-09 MED ORDER — LORAZEPAM 1 MG PO TABS: 1.0000 mg | ORAL_TABLET | ORAL | Status: AC | PRN

## 2021-01-09 MED ORDER — FOLIC ACID 1 MG PO TABS
1.0000 mg | ORAL_TABLET | Freq: Every day | ORAL | Status: DC
  Administered 2021-01-10 – 2021-01-16 (×7): 1 mg via ORAL
  Filled 2021-01-09 (×8): qty 1

## 2021-01-09 MED ORDER — LORAZEPAM 1 MG PO TABS
1.0000 mg | ORAL_TABLET | ORAL | Status: AC | PRN
  Administered 2021-01-11: 3 mg via ORAL
  Filled 2021-01-09: qty 3

## 2021-01-09 MED ORDER — LORAZEPAM 2 MG/ML IJ SOLN
1.0000 mg | INTRAMUSCULAR | Status: AC | PRN
  Administered 2021-01-09: 2 mg via INTRAVENOUS
  Administered 2021-01-09 (×2): 4 mg via INTRAVENOUS
  Administered 2021-01-10: 2 mg via INTRAVENOUS
  Administered 2021-01-10 (×2): 4 mg via INTRAVENOUS
  Administered 2021-01-11: 3 mg via INTRAVENOUS
  Administered 2021-01-11: 4 mg via INTRAVENOUS
  Administered 2021-01-11: 3 mg via INTRAVENOUS
  Filled 2021-01-09 (×4): qty 2
  Filled 2021-01-09 (×2): qty 1
  Filled 2021-01-09 (×2): qty 2
  Filled 2021-01-09 (×2): qty 1

## 2021-01-09 MED ORDER — ENSURE MAX PROTEIN PO LIQD
11.0000 [oz_av] | Freq: Two times a day (BID) | ORAL | Status: DC
  Administered 2021-01-09 – 2021-01-16 (×12): 11 [oz_av] via ORAL
  Filled 2021-01-09: qty 330

## 2021-01-09 MED ORDER — CHLORDIAZEPOXIDE HCL 25 MG PO CAPS
25.0000 mg | ORAL_CAPSULE | Freq: Four times a day (QID) | ORAL | Status: DC
  Administered 2021-01-09 – 2021-01-16 (×26): 25 mg via ORAL
  Filled 2021-01-09 (×27): qty 1

## 2021-01-09 NOTE — Progress Notes (Addendum)
Progress Note    Andrew Buchanan  BMW:[id number] DOB: 05-22-1971  DOA: 01/05/2021 PCP: Patient, No Pcp Per (Inactive)      Brief Narrative:    Medical records reviewed and are as summarized below:  Andrew Buchanan is a 50 y.o. male with medical history significant for type II DM, hypertension, pancreatitis, anxiety, alcohol use disorder, cocaine abuse, who was brought to the hospital because of altered mental status.  He was found to have hyperosmolar hyperglycemic state, severe sepsis and AKI.  He was admitted to the ICU for further management.  Additional work-up revealed E. coli bacteremia.  He was treated with IV insulin infusion, IV fluids and empiric IV antibiotics.  He was transferred to the hospitalist service on 01/08/2021      Assessment/Plan:   Principal Problem:   Sepsis with acute renal failure without septic shock (HCC) Active Problems:   Hyperosmolar hyperglycemic state (HHS) (HCC)   Malnutrition of moderate degree   AKI (acute kidney injury) (HCC)   Nutrition Problem: Moderate Malnutrition Etiology: chronic illness (uncontrolled DM)  Signs/Symptoms: mild fat depletion,moderate fat depletion,mild muscle depletion,moderate muscle depletion   Body mass index is 25.68 kg/m.    Type II DM complicated by hyperosmolar hyperglycemic state: He is off of IV insulin infusion. Increase Lantus from 30 units to 35 units daily.  Increase NovoLog from 5 units every 4 hours to 8 hours 3 times daily.  Hemoglobin A1c is pending.   Severe sepsis secondary to E. coli bacteremia: Continue IV cefazolin.  Urine culture showed multiple species.  Blood culture showed E. coli.  AKI: Creatinine is trending down.  Continue IV fluids and monitor BMP.  Blurred vision: Patient has been advised to follow-up with ophthalmologist for a thorough eye exam.  He was informed for glucose control can cause blurred vision.  Alcohol use disorder, acute toxic metabolic  encephalopathy: Counseled to quit alcohol.  Continue diazepam and Ativan as needed per CIWA protocol.  Haldol as needed for agitation.  Cocaine abuse: Counseled to quit.  Medical nonadherence: The importance of medical adherence was reiterated.  Hyponatremia and hyperkalemia: resolved  Diet Order            Diet Carb Modified Fluid consistency: Thin; Room service appropriate? Yes  Diet effective now                    Consultants:  Intensivist  Procedures:  None    Medications:   . Chlorhexidine Gluconate Cloth  6 each Topical Daily  . diazepam  5 mg Oral Q6H  . [START ON 01/10/2021] folic acid  1 mg Oral Daily  . gabapentin  600 mg Oral BID  . heparin  5,000 Units Subcutaneous Q8H  . insulin aspart  0-20 Units Subcutaneous Q4H  . insulin aspart  8 Units Subcutaneous TID WC  . [START ON 01/10/2021] insulin glargine  35 Units Subcutaneous Daily  . multivitamin with minerals  1 tablet Oral Daily  . mupirocin ointment  1 application Nasal BID  . pantoprazole  40 mg Oral Daily  . phosphorus  500 mg Oral TID  . Ensure Max Protein  11 oz Oral BID   Continuous Infusions: .  ceFAZolin (ANCEF) IV 2 g (01/09/21 0536)  . lactated ringers 75 mL/hr at 01/09/21 0158     Anti-infectives (From admission, onward)   Start     Dose/Rate Route Frequency Ordered Stop   01/08/21 2200  ceFAZolin (ANCEF) IVPB 2g/100 mL premix  2 g 200 mL/hr over 30 Minutes Intravenous Every 8 hours 01/08/21 1004     01/06/21 2200  cefTRIAXone (ROCEPHIN) 2 g in sodium chloride 0.9 % 100 mL IVPB  Status:  Discontinued        2 g 200 mL/hr over 30 Minutes Intravenous Every 24 hours 01/06/21 1113 01/08/21 1004   01/06/21 0800  ceFEPIme (MAXIPIME) 2 g in sodium chloride 0.9 % 100 mL IVPB  Status:  Discontinued        2 g 200 mL/hr over 30 Minutes Intravenous Every 12 hours 01/05/21 2251 01/06/21 1112   01/05/21 2015  ceFEPIme (MAXIPIME) 2 g in sodium chloride 0.9 % 100 mL IVPB        2 g 200  mL/hr over 30 Minutes Intravenous  Once 01/05/21 2003 01/05/21 2043   01/05/21 2015  metroNIDAZOLE (FLAGYL) IVPB 500 mg        500 mg 100 mL/hr over 60 Minutes Intravenous  Once 01/05/21 2003 01/05/21 2147   01/05/21 2015  vancomycin (VANCOCIN) IVPB 1000 mg/200 mL premix        1,000 mg 200 mL/hr over 60 Minutes Intravenous  Once 01/05/21 2003 01/05/21 2336             Family Communication/Anticipated D/C date and plan/Code Status   DVT prophylaxis: heparin injection 5,000 Units Start: 01/05/21 2245 SCDs Start: 01/05/21 2234     Code Status: Full Code  Family Communication: None Disposition Plan:    Status is: Inpatient  Remains inpatient appropriate because:IV treatments appropriate due to intensity of illness or inability to take PO and Inpatient level of care appropriate due to severity of illness   Dispo: The patient is from: Home              Anticipated d/c is to: Home              Patient currently is not medically stable to d/c.   Difficult to place patient No           Subjective:   Interval events noted.  Patient has been intermittently confused and agitated.  Objective:    Vitals:   01/09/21 0700 01/09/21 0711 01/09/21 0800 01/09/21 1200  BP: 140/90  (!) 121/93 (!) 142/91  Pulse: (!) 104 (!) 101 95 (!) 136  Resp: 16 20 18 19   Temp:  99.3 F (37.4 C)  98.3 F (36.8 C)  TempSrc:  Oral  Oral  SpO2: 99% 97% 96% 96%  Weight:      Height:       No data found.   Intake/Output Summary (Last 24 hours) at 01/09/2021 1314 Last data filed at 01/09/2021 0641 Gross per 24 hour  Intake 1456.26 ml  Output 2550 ml  Net -1093.74 ml   Filed Weights   01/06/21 0131 01/08/21 0500 01/09/21 0500  Weight: 79.5 kg 82.1 kg 85.9 kg    Exam:  GEN: NAD SKIN: Wound on forward EYES: EOMI, no pallor or icterus.  He still able to count fingers. ENT: MMM CV: RRR PULM: CTA B ABD: soft, ND, NT, +BS CNS: AAO x 3, confused, non focal EXT: No edema or  tenderness           Data Reviewed:   I have personally reviewed following labs and imaging studies:  Labs: Labs show the following:   Basic Metabolic Panel: Recent Labs  Lab 01/06/21 0627 01/06/21 1127 01/06/21 1541 01/06/21 2052 01/07/21 0500 01/07/21 1145 01/08/21 0521 01/08/21 2028 01/09/21  0317  NA 144 147*   < > 152* 148* 133* 140  --  146*  K 3.9 4.1   < > 3.5 3.7 3.8 3.5  --  3.5  CL 103 107   < > 114* 110 101 102  --  110  CO2 29 30   < > --  28  GLUCOSE 469* 286*   < > 212* 233* 632* 150* 303* 155*  BUN 54* 52*   < > 56* 53* 54* 37*  --  21*  CREATININE 2.20* 2.19*   < > 1.90* 1.84* 1.69* 1.55*  --  1.32*  CALCIUM 9.9 9.6   < > 9.5 8.8* 7.4* 8.3*  --  8.4*  MG 3.0* 2.6*  --   --  2.4  --  2.0  --  2.0  PHOS 5.1* 4.2  --   --  3.4  --  2.2*  --  2.2*   < > = values in this interval not displayed.   GFR Estimated Creatinine Clearance: 74.3 mL/min (A) (by C-G formula based on SCr of 1.32 mg/dL (H)). Liver Function Tests: Recent Labs  Lab 01/05/21 2001 01/07/21 0500  AST 18 43*  ALT 17 15  ALKPHOS 122 70  BILITOT 1.8* 0.7  PROT 8.3* 6.7  ALBUMIN 3.4* 2.5*   Recent Labs  Lab 01/05/21 2001  LIPASE 54*   Recent Labs  Lab 01/05/21 2001  AMMONIA 26   Coagulation profile No results for input(s): INR, PROTIME in the last 168 hours.  CBC: Recent Labs  Lab 01/05/21 2001 01/06/21 0030 01/06/21 0627 01/06/21 1127 01/07/21 0500 01/08/21 0521 01/09/21 0317  WBC 25.5*   < > 21.4* 19.3* 16.9* 17.7* 12.3*  NEUTROABS 21.0*  --   --   --   --  13.8* 9.0*  HGB 14.2   < > 13.8 12.5* 12.4* 13.1 11.3*  HCT 42.1   < > 37.8* 35.2* 37.1* 38.8* 34.9*  MCV 105.0*   < > 95.9 98.6 104.8* 103.5* 106.7*  PLT 395   < > 302 281 240 286 216   < > = values in this interval not displayed.   Cardiac Enzymes: No results for input(s): CKTOTAL, CKMB, CKMBINDEX, TROPONINI in the last 168 hours. BNP (last 3 results) No results for input(s): PROBNP in  the last 8760 hours. CBG: Recent Labs  Lab 01/08/21 2215 01/09/21 0051 01/09/21 0445 01/09/21 0748 01/09/21 1140  GLUCAP 185* 153* 176* 156* 207*   D-Dimer: No results for input(s): DDIMER in the last 72 hours. Hgb A1c: No results for input(s): HGBA1C in the last 72 hours. Lipid Profile: No results for input(s): CHOL, HDL, LDLCALC, TRIG, CHOLHDL, LDLDIRECT in the last 72 hours. Thyroid function studies: No results for input(s): TSH, T4TOTAL, T3FREE, THYROIDAB in the last 72 hours.  Invalid input(s): FREET3 Anemia work up: No results for input(s): VITAMINB12, FOLATE, FERRITIN, TIBC, IRON, RETICCTPCT in the last 72 hours. Sepsis Labs: Recent Labs  Lab 01/05/21 2001 01/05/21 2225 01/06/21 0030 01/06/21 0627 01/06/21 1127 01/07/21 0500 01/08/21 0521 01/09/21 0317  PROCALCITON 0.78  --   --   --   --  0.52 0.29  --   WBC 25.5*  --    < > 21.4* 19.3* 16.9* 17.7* 12.3*  LATICACIDVEN 2.7* 2.4*  --  2.3*  --   --   --   --    < > = values in this interval not displayed.  Microbiology Recent Results (from the past 240 hour(s))  Culture, blood (routine x 2)     Status: Abnormal   Collection Time: 01/05/21  8:01 PM   Specimen: BLOOD  Result Value Ref Range Status   Specimen Description   Final    BLOOD BLOOD RIGHT FOREARM Performed at [facility], [address] [place], [place] [address]    Special Requests   Final    BOTTLES DRAWN AEROBIC AND ANAEROBIC Blood Culture results may not be optimal due to an excessive volume of blood received in culture bottles Performed at [facility], [address] [place], [place] [address]    Culture  Setup Time   Final    Organism ID to follow GRAM NEGATIVE RODS IN BOTH AEROBIC AND ANAEROBIC BOTTLES CRITICAL RESULT CALLED TO, READ BACK BY AND VERIFIED WITH: LISA KLUTTZ AT 1046 01/06/21 SDR Performed at [facility], [address], [place], [place] [address]    Culture ESCHERICHIA COLI (A)  Final    Report Status 01/08/2021 FINAL  Final   Organism ID, Bacteria ESCHERICHIA COLI  Final      Susceptibility   Escherichia coli - MIC*    AMPICILLIN >=32 RESISTANT Resistant     CEFAZOLIN <=4 SENSITIVE Sensitive     CEFEPIME <=0.12 SENSITIVE Sensitive     CEFTAZIDIME <=1 SENSITIVE Sensitive     CEFTRIAXONE <=0.25 SENSITIVE Sensitive     CIPROFLOXACIN <=0.25 SENSITIVE Sensitive     GENTAMICIN <=1 SENSITIVE Sensitive     IMIPENEM <=0.25 SENSITIVE Sensitive     TRIMETH/SULFA <=20 SENSITIVE Sensitive     AMPICILLIN/SULBACTAM >=32 RESISTANT Resistant     PIP/TAZO <=4 SENSITIVE Sensitive     * ESCHERICHIA COLI  Culture, blood (routine x 2)     Status: None (Preliminary result)   Collection Time: 01/05/21  8:01 PM   Specimen: BLOOD  Result Value Ref Range Status   Specimen Description BLOOD BLOOD LEFT FOREARM  Final   Special Requests   Final    BOTTLES DRAWN AEROBIC AND ANAEROBIC Blood Culture results may not be optimal due to an inadequate volume of blood received in culture bottles   Culture   Final    NO GROWTH 4 DAYS Performed at [facility], [address] [place], [place] [address]    Report Status PENDING  Incomplete  Blood Culture ID Panel (Reflexed)     Status: Abnormal   Collection Time: 01/05/21  8:01 PM  Result Value Ref Range Status   Enterococcus faecalis NOT DETECTED NOT DETECTED Final   Enterococcus Faecium NOT DETECTED NOT DETECTED Final   Listeria monocytogenes NOT DETECTED NOT DETECTED Final   Staphylococcus species NOT DETECTED NOT DETECTED Final   Staphylococcus aureus (BCID) NOT DETECTED NOT DETECTED Final   Staphylococcus epidermidis NOT DETECTED NOT DETECTED Final   Staphylococcus lugdunensis NOT DETECTED NOT DETECTED Final   Streptococcus species NOT DETECTED NOT DETECTED Final   Streptococcus agalactiae NOT DETECTED NOT DETECTED Final   Streptococcus pneumoniae NOT DETECTED NOT DETECTED Final   Streptococcus pyogenes NOT DETECTED NOT DETECTED  Final   A.calcoaceticus-baumannii NOT DETECTED NOT DETECTED Final   Bacteroides fragilis NOT DETECTED NOT DETECTED Final   Enterobacterales DETECTED (A) NOT DETECTED Final    Comment: Enterobacterales represent a large order of gram negative bacteria, not a single organism. CRITICAL RESULT CALLED TO, READ BACK BY AND VERIFIED WITH:  LISA KLUTTZ AT 1046 01/06/21 SDR    Enterobacter cloacae complex NOT DETECTED  NOT DETECTED Final   Escherichia coli DETECTED (A) NOT DETECTED Final    Comment: CRITICAL RESULT CALLED TO, READ BACK BY AND VERIFIED WITH:  LISA KLUTTZ AT 1046 01/06/21 SDR    Klebsiella aerogenes NOT DETECTED NOT DETECTED Final   Klebsiella oxytoca NOT DETECTED NOT DETECTED Final   Klebsiella pneumoniae NOT DETECTED NOT DETECTED Final   Proteus species NOT DETECTED NOT DETECTED Final   Salmonella species NOT DETECTED NOT DETECTED Final   Serratia marcescens NOT DETECTED NOT DETECTED Final   Haemophilus influenzae NOT DETECTED NOT DETECTED Final   Neisseria meningitidis NOT DETECTED NOT DETECTED Final   Pseudomonas aeruginosa NOT DETECTED NOT DETECTED Final   Stenotrophomonas maltophilia NOT DETECTED NOT DETECTED Final   Candida albicans NOT DETECTED NOT DETECTED Final   Candida auris NOT DETECTED NOT DETECTED Final   Candida glabrata NOT DETECTED NOT DETECTED Final   Candida krusei NOT DETECTED NOT DETECTED Final   Candida parapsilosis NOT DETECTED NOT DETECTED Final   Candida tropicalis NOT DETECTED NOT DETECTED Final   Cryptococcus neoformans/gattii NOT DETECTED NOT DETECTED Final   CTX-M ESBL NOT DETECTED NOT DETECTED Final   Carbapenem resistance IMP NOT DETECTED NOT DETECTED Final   Carbapenem resistance KPC NOT DETECTED NOT DETECTED Final   Carbapenem resistance NDM NOT DETECTED NOT DETECTED Final   Carbapenem resist OXA 48 LIKE NOT DETECTED NOT DETECTED Final   Carbapenem resistance VIM NOT DETECTED NOT DETECTED Final    Comment: Performed at [facility], [address] [place], [place] [address]  Resp Panel by RT-PCR (Flu A&B, Covid) Nasopharyngeal Swab     Status: None   Collection Time: 01/05/21  8:10 PM   Specimen: Nasopharyngeal Swab; Nasopharyngeal(NP) swabs in vial transport medium  Result Value Ref Range Status   SARS Coronavirus 2 by RT PCR NEGATIVE NEGATIVE Final    Comment: (NOTE) SARS-CoV-2 target nucleic acids are NOT DETECTED.  The SARS-CoV-2 RNA is generally detectable in upper respiratory specimens during the acute phase of infection. The lowest concentration of SARS-CoV-2 viral copies this assay can detect is 138 copies/mL. A negative result does not preclude SARS-Cov-2 infection and should not be used as the sole basis for treatment or other patient management decisions. A negative result may occur with  improper specimen collection/handling, submission of specimen other than nasopharyngeal swab, presence of viral mutation(s) within the areas targeted by this assay, and inadequate number of viral copies(<138 copies/mL). A negative result must be combined with clinical observations, patient history, and epidemiological information. The expected result is Negative.  Fact Sheet for Patients:  BloggerCourse.com  Fact Sheet for Healthcare Providers:  SeriousBroker.it  This test is no t yet approved or cleared by the [place] FDA and  has been authorized for detection and/or diagnosis of SARS-CoV-2 by FDA under an Emergency Use Authorization (EUA). This EUA will remain  in effect (meaning this test can be used) for the duration of the COVID-19 declaration under Section 564(b)(1) of the Act, 21 U.S.C.section 360bbb-3(b)(1), unless the authorization is terminated  or revoked sooner.       Influenza A by PCR NEGATIVE NEGATIVE Final   Influenza B by PCR NEGATIVE NEGATIVE Final    Comment: (NOTE) The Xpert Xpress SARS-CoV-2/FLU/RSV plus assay is intended as an aid in  the diagnosis of influenza from Nasopharyngeal swab specimens and should not be used as a sole basis for treatment. Nasal washings and aspirates are unacceptable for Xpert Xpress SARS-CoV-2/FLU/RSV testing.  Fact Sheet for Patients:  BloggerCourse.com  Fact Sheet for Healthcare Providers: SeriousBroker.it  This test is not yet approved or cleared by the [place] FDA and has been authorized for detection and/or diagnosis of SARS-CoV-2 by FDA under an Emergency Use Authorization (EUA). This EUA will remain in effect (meaning this test can be used) for the duration of the COVID-19 declaration under Section 564(b)(1) of the Act, 21 U.S.C. section 360bbb-3(b)(1), unless the authorization is terminated or revoked.  Performed at [facility], [address] [place], [place] [address]   Urine culture     Status: Abnormal   Collection Time: 01/05/21  9:10 PM   Specimen: In/Out Cath Urine  Result Value Ref Range Status   Specimen Description   Final    IN/OUT CATH URINE Performed at [facility], [address], [place], [place] [address]    Special Requests   Final    NONE Performed at [facility], [address] [place], [place] [address]    Culture MULTIPLE SPECIES PRESENT, SUGGEST RECOLLECTION (A)  Final   Report Status 01/07/2021 FINAL  Final  MRSA PCR Screening     Status: Abnormal   Collection Time: 01/06/21  5:00 AM   Specimen: Nasopharyngeal  Result Value Ref Range Status   MRSA by PCR POSITIVE (A) NEGATIVE Final    Comment:        The GeneXpert MRSA Assay (FDA approved for NASAL specimens only), is one component of a comprehensive MRSA colonization surveillance program. It is not intended to diagnose MRSA infection nor to guide or monitor treatment for MRSA infections. RESULT CALLED TO, READ BACK BY AND VERIFIED WITH:  [facility] AT 0710 01/06/21 SDR Performed at [facility], [address] [place], [place] [address]     Procedures and diagnostic studies:  No results found.             LOS: 4 days   Arrin Ishler  [facility]   Pager on [email or web address]. If 7PM-7AM, please contact night-coverage at [email or web address]     01/09/2021, 1:14 PM

## 2021-01-09 NOTE — Consult Note (Signed)
PHARMACY CONSULT NOTE  Pharmacy Consult for Electrolyte Monitoring and Replacement   Recent Labs: Potassium (mmol/L)  Date Value  01/09/2021 3.5  08/18/2014 3.8   Magnesium (mg/dL)  Date Value  22/24/1146 2.0   Calcium (mg/dL)  Date Value  43/14/2767 8.4 (L)   Calcium, Total (mg/dL)  Date Value  10/18/347 8.4 (L)   Albumin (g/dL)  Date Value  61/16/4353 2.5 (L)  08/18/2014 3.9   Phosphorus (mg/dL)  Date Value  91/22/5834 2.2 (L)   Sodium (mmol/L)  Date Value  01/09/2021 146 (H)  08/18/2014 142   Corrected Ca: 9.6 mg/dL  Assessment: 50 yo M presented with AMS. Pt found to have HHS and e. Coli bacteremia. PMH includes HTN, diabetes, alcohol abuse, polysubstance abuse.  Hyperglycemic on admission which has improved  Pharmacy has been consulted for monitoring of electrolytes  Goal of Therapy:  Electrolytes WNL  Plan:   K phos Neutral 500 mg x 2 (each dose contains 16 mmol phosphorous and 2.2 mEq potassium)  with AM labs  Lowella Bandy, PharmD, BCPS Clinical Pharmacist 01/09/2021 7:03 AM

## 2021-01-09 NOTE — Progress Notes (Signed)
Awake 80% of the night. Confused, agitated, CIWA was 20 twice. 4mg  of ativan given each time. 10mg  of ativan given this shift. Incontinence of urine, he tries to jump up out of bed impulsively each time he has to void but its pointless because he is unsteady and when he does get the urinal, he cannot hold it so that his penis in not inside and urine gets everywhere requiring many total bed linen changes. Call bell kept within reach. Water at bedside. Bed alarm on a functioning. Denied pain or discomfort. Endorsed to .

## 2021-01-09 NOTE — Progress Notes (Signed)
Inpatient Diabetes Program Recommendations  AACE/ADA: New Consensus Statement on Inpatient Glycemic Control   Target Ranges:  Prepandial:   less than 140 mg/dL      Peak postprandial:   less than 180 mg/dL (1-2 hours)      Critically ill patients:  140 - 180 mg/dL  Results for Andrew Buchanan, Andrew Buchanan (MRN [id number]) as of 01/09/2021 09:10  Ref. Range 01/08/2021 07:42 01/08/2021 11:16 01/08/2021 15:55 01/08/2021 19:36 01/08/2021 22:15 01/09/2021 00:51 01/09/2021 04:45 01/09/2021 07:48  Glucose-Capillary Latest Ref Range: 70 - 99 mg/dL 295 (H) 284 (H) 132 (H) 413 (H) 185 (H) 153 (H) 176 (H) 156 (H)  Results for Andrew Buchanan, Andrew Buchanan (MRN [id number]) as of 01/09/2021 09:10  Ref. Range 05/19/2020 11:50  Hemoglobin A1C Latest Ref Range: 4.8 - 5.6 % >15.5 (H)    Review of Glycemic Control  Diabetes history: DM2 Outpatient Diabetes medications: Lantus 35 units daily, Novolog 6 units TID with meals, Metformin 1000 mg BID Current orders for Inpatient glycemic control: Lantus 30 units Q24H, Novolog 5 units Q4H, Novolog 0-20 units Q4H  Inpatient Diabetes Program Recommendations:    Insulin: Please consider increasing Lantus to 36 units Q24H, discontinuing Novolog 5 units Q4H and ordering Novolog 8 units TID with meals for meal coverage if patient eats at least 50% of meals.  Thanks, Orlando Penner, RN, MSN, CDE Diabetes Coordinator Inpatient Diabetes Program [phone] (Team Pager from 8am to 5pm)

## 2021-01-09 NOTE — Progress Notes (Signed)
Pt is very agitated. Getting out of bed frequently. Ativan has been given; does not seem to be helping much. Haldol administered. We will continue to monitor.

## 2021-01-09 NOTE — Progress Notes (Signed)
Spoke to Dr. Myriam Forehand over the phone. Patient extremely agitated and combative. Swinging at staff and pulling at medical equipment. Very confused and hallucinating at this time. MD reordered expired ativan orders, added further prn orders, and canceled patient's transfer to the med-surg floor.

## 2021-01-10 LAB — BASIC METABOLIC PANEL
Anion gap: 9 (ref 5–15)
BUN: 22 mg/dL — ABNORMAL HIGH (ref 6–20)
CO2: 25 mmol/L (ref 22–32)
Calcium: 7.9 mg/dL — ABNORMAL LOW (ref 8.9–10.3)
Chloride: 105 mmol/L (ref 98–111)
Creatinine, Ser: 1.18 mg/dL (ref 0.61–1.24)
GFR, Estimated: >60 mL/min (ref >60)
Glucose, Bld: 320 mg/dL — ABNORMAL HIGH (ref 70–99)
Potassium: 4.2 mmol/L (ref 3.5–5.1)
Sodium: 139 mmol/L (ref 135–145)

## 2021-01-10 LAB — CBC WITH DIFFERENTIAL/PLATELET
Abs Immature Granulocytes: 0.9 10*3/uL — ABNORMAL HIGH (ref 0.00–0.07)
Basophils Absolute: 0.1 10*3/uL (ref 0.0–0.1)
Basophils Relative: 1 %
Eosinophils Absolute: 0.1 10*3/uL (ref 0.0–0.5)
Eosinophils Relative: 1 %
HCT: 34 % — ABNORMAL LOW (ref 39.0–52.0)
Hemoglobin: 10.9 g/dL — ABNORMAL LOW (ref 13.0–17.0)
Immature Granulocytes: 7 %
Lymphocytes Relative: 17 %
Lymphs Abs: 2.1 10*3/uL (ref 0.7–4.0)
MCH: 35 pg — ABNORMAL HIGH (ref 26.0–34.0)
MCHC: 32.1 g/dL (ref 30.0–36.0)
MCV: 109.3 fL — ABNORMAL HIGH (ref 80.0–100.0)
Monocytes Absolute: 0.6 10*3/uL (ref 0.1–1.0)
Monocytes Relative: 5 %
Neutro Abs: 8.7 10*3/uL — ABNORMAL HIGH (ref 1.7–7.7)
Neutrophils Relative %: 69 %
Platelets: 156 10*3/uL (ref 150–400)
RBC: 3.11 MIL/uL — ABNORMAL LOW (ref 4.22–5.81)
RDW: 12.7 % (ref 11.5–15.5)
Smear Review: NORMAL
WBC: 12.5 10*3/uL — ABNORMAL HIGH (ref 4.0–10.5)
nRBC: 0 % (ref 0.0–0.2)

## 2021-01-10 LAB — MAGNESIUM: Magnesium: 1.8 mg/dL (ref 1.7–2.4)

## 2021-01-10 LAB — CULTURE, BLOOD (ROUTINE X 2): Culture: NO GROWTH

## 2021-01-10 LAB — FOLATE: Folate: 6.9 ng/mL (ref >5.9)

## 2021-01-10 LAB — GLUCOSE, CAPILLARY
Glucose-Capillary: 216 mg/dL — ABNORMAL HIGH (ref 70–99)
Glucose-Capillary: 328 mg/dL — ABNORMAL HIGH (ref 70–99)
Glucose-Capillary: 341 mg/dL — ABNORMAL HIGH (ref 70–99)
Glucose-Capillary: 344 mg/dL — ABNORMAL HIGH (ref 70–99)
Glucose-Capillary: 377 mg/dL — ABNORMAL HIGH (ref 70–99)
Glucose-Capillary: 385 mg/dL — ABNORMAL HIGH (ref 70–99)
Glucose-Capillary: 71 mg/dL (ref 70–99)

## 2021-01-10 LAB — VITAMIN B12: Vitamin B-12: 1817 pg/mL — ABNORMAL HIGH (ref 180–914)

## 2021-01-10 LAB — PHOSPHORUS: Phosphorus: 2.7 mg/dL (ref 2.5–4.6)

## 2021-01-10 MED ORDER — INSULIN GLARGINE 100 UNIT/ML ~~LOC~~ SOLN
40.0000 [IU] | Freq: Every day | SUBCUTANEOUS | Status: DC
  Administered 2021-01-11: 40 [IU] via SUBCUTANEOUS
  Filled 2021-01-10 (×2): qty 0.4

## 2021-01-10 MED ORDER — ROCURONIUM BROMIDE 50 MG/5ML IV SOLN
INTRAVENOUS | Status: AC
  Filled 2021-01-10: qty 1

## 2021-01-10 MED ORDER — INSULIN ASPART 100 UNIT/ML ~~LOC~~ SOLN
12.0000 [IU] | Freq: Three times a day (TID) | SUBCUTANEOUS | Status: DC
  Administered 2021-01-10 – 2021-01-11 (×4): 12 [IU] via SUBCUTANEOUS
  Filled 2021-01-10 (×4): qty 1

## 2021-01-10 MED ORDER — FENTANYL CITRATE (PF) 100 MCG/2ML IJ SOLN
INTRAMUSCULAR | Status: AC
  Filled 2021-01-10: qty 2

## 2021-01-10 MED ORDER — MAGNESIUM SULFATE 2 GM/50ML IV SOLN
2.0000 g | Freq: Once | INTRAVENOUS | Status: AC
  Administered 2021-01-10: 2 g via INTRAVENOUS
  Filled 2021-01-10: qty 50

## 2021-01-10 MED ORDER — ETOMIDATE 2 MG/ML IV SOLN
INTRAVENOUS | Status: AC
  Filled 2021-01-10: qty 10

## 2021-01-10 MED ORDER — INSULIN GLARGINE 100 UNIT/ML ~~LOC~~ SOLN
5.0000 [IU] | Freq: Once | SUBCUTANEOUS | Status: DC
  Filled 2021-01-10: qty 0.05

## 2021-01-10 NOTE — Consult Note (Signed)
PHARMACY CONSULT NOTE  Pharmacy Consult for Electrolyte Monitoring and Replacement   Recent Labs: Potassium (mmol/L)  Date Value  01/10/2021 4.2  08/18/2014 3.8   Magnesium (mg/dL)  Date Value  98/42/1031 1.8   Calcium (mg/dL)  Date Value  28/08/8866 7.9 (L)   Calcium, Total (mg/dL)  Date Value  73/73/6681 8.4 (L)   Albumin (g/dL)  Date Value  59/47/0761 2.5 (L)  08/18/2014 3.9   Phosphorus (mg/dL)  Date Value  51/83/4373 2.7   Sodium (mmol/L)  Date Value  01/10/2021 139  08/18/2014 142   Corrected Ca: 9.6 mg/dL  Assessment: 50 yo M presented with AMS. Pt found to have HHS and e. Coli bacteremia. PMH includes HTN, diabetes, alcohol abuse, polysubstance abuse.  He was hyperglycemic on admission which has improved  Pharmacy has been consulted for monitoring of electrolytes  MIVF: lactated ringers at 75 mL/hr  Goal of Therapy:  Electrolytes WNL  Plan:   2 grams IV magnesium sulfate x 1  Re-check electrolytes with AM labs  Lowella Bandy, PharmD, BCPS Clinical Pharmacist 01/10/2021 6:58 AM

## 2021-01-10 NOTE — Progress Notes (Signed)
Inpatient Diabetes Program Recommendations  AACE/ADA: New Consensus Statement on Inpatient Glycemic Control  Target Ranges:  Prepandial:   less than 140 mg/dL      Peak postprandial:   less than 180 mg/dL (1-2 hours)      Critically ill patients:  140 - 180 mg/dL   Results for Andrew Buchanan, Andrew Buchanan (MRN [id number]) as of 01/10/2021 08:11  Ref. Range 01/09/2021 07:48 01/09/2021 11:40 01/09/2021 15:45 01/09/2021 20:17 01/09/2021 23:46 01/10/2021 03:41 01/10/2021 07:15  Glucose-Capillary Latest Ref Range: 70 - 99 mg/dL 250 (H) 037 (H) 048 (H) 327 (H) 134 (H) 341 (H) 377 (H)   Review of Glycemic Control  Diabetes history: DM2 Outpatient Diabetes medications: Lantus 35 units daily, Novolog 6 units TID with meals, Metformin 1000 mg BID Current orders for Inpatient glycemic control: Lantus 35 units Q24H, Novolog 8 units TID with meals, Novolog 0-20 units Q4H  Inpatient Diabetes Program Recommendations:    Insulin: Please consider increasing Lantus to 40 units Q24H and meal coverage to Novolog 12 units TID with meals if patient eats at least 50% of meals.  Thanks, Orlando Penner, RN, MSN, CDE Diabetes Coordinator Inpatient Diabetes Program [phone] (Team Pager from 8am to 5pm)

## 2021-01-10 NOTE — Progress Notes (Addendum)
Progress Note    Andrew Buchanan  JOI:[id number] DOB: July 14, 1971  DOA: 01/05/2021 PCP: Patient, No Pcp Per (Inactive)      Brief Narrative:    Medical records reviewed and are as summarized below:  Andrew Buchanan is a 50 y.o. male with medical history significant for type II DM, hypertension, pancreatitis, anxiety, alcohol use disorder, cocaine abuse, who was brought to the hospital because of altered mental status.  He was found to have hyperosmolar hyperglycemic state, severe sepsis and AKI.  He was admitted to the ICU for further management.  Additional work-up revealed E. coli bacteremia.  He was treated with IV insulin infusion, IV fluids and empiric IV antibiotics.  He was transferred to the hospitalist service on 01/08/2021.  Hospital course was complicated by acute toxic metabolic encephalopathy/delirium/alcohol withdrawal syndrome.      Assessment/Plan:   Principal Problem:   Sepsis with acute renal failure without septic shock (HCC) Active Problems:   Hyperosmolar hyperglycemic state (HHS) (HCC)   Malnutrition of moderate degree   AKI (acute kidney injury) (HCC)   Nutrition Problem: Moderate Malnutrition Etiology: chronic illness (uncontrolled DM)  Signs/Symptoms: mild fat depletion,moderate fat depletion,mild muscle depletion,moderate muscle depletion   Body mass index is 26.1 kg/m.    Type II DM complicated by hyperosmolar hyperglycemic state: He is off of IV insulin infusion.  Increase Lantus from 35 units to 40 units daily.  Increase NovoLog from 8 units 3 times daily to 12 units 3 times daily.  Monitor glucose levels closely.  Hemoglobin A1c is greater than 15.5   Severe sepsis secondary to E. coli bacteremia: Continue IV cefazolin.  Urine culture showed multiple species.  Blood culture showed E. coli.  AKI: Creatinine is trending down.  Discontinue IV fluids and monitor BMP.   Blurred vision: Patient has been advised to follow-up with  ophthalmologist for a thorough eye exam.  He was informed that glucose control can cause blurred vision.  Alcohol use disorder, acute toxic metabolic encephalopathy:   Continue diazepam and Ativan as needed per CIWA protocol.  Haldol as needed for agitation.  Cocaine abuse: Counseled to quit.  Medical nonadherence: The importance of medical adherence was reiterated.  Macrocytic anemia: Check vitamin B12 and folate levels.  Hyponatremia and hyperkalemia: resolved  Diet Order            Diet Carb Modified Fluid consistency: Thin; Room service appropriate? Yes  Diet effective now                    Consultants:  Intensivist  Procedures:  None    Medications:   . chlordiazePOXIDE  25 mg Oral QID  . Chlorhexidine Gluconate Cloth  6 each Topical Daily  . folic acid  1 mg Oral Daily  . gabapentin  600 mg Oral BID  . heparin  5,000 Units Subcutaneous Q8H  . insulin aspart  0-20 Units Subcutaneous Q4H  . insulin aspart  12 Units Subcutaneous TID WC  . [START ON 01/11/2021] insulin glargine  40 Units Subcutaneous Daily  . insulin glargine  5 Units Subcutaneous Once  . multivitamin with minerals  1 tablet Oral Daily  . mupirocin ointment  1 application Nasal BID  . pantoprazole  40 mg Oral Daily  . Ensure Max Protein  11 oz Oral BID   Continuous Infusions: .  ceFAZolin (ANCEF) IV 2 g (01/10/21 1513)  . lactated ringers 75 mL/hr at 01/10/21 0541     Anti-infectives (From  admission, onward)   Start     Dose/Rate Route Frequency Ordered Stop   01/08/21 2200  ceFAZolin (ANCEF) IVPB 2g/100 mL premix        2 g 200 mL/hr over 30 Minutes Intravenous Every 8 hours 01/08/21 1004     01/06/21 2200  cefTRIAXone (ROCEPHIN) 2 g in sodium chloride 0.9 % 100 mL IVPB  Status:  Discontinued        2 g 200 mL/hr over 30 Minutes Intravenous Every 24 hours 01/06/21 1113 01/08/21 1004   01/06/21 0800  ceFEPIme (MAXIPIME) 2 g in sodium chloride 0.9 % 100 mL IVPB  Status:  Discontinued         2 g 200 mL/hr over 30 Minutes Intravenous Every 12 hours 01/05/21 2251 01/06/21 1112   01/05/21 2015  ceFEPIme (MAXIPIME) 2 g in sodium chloride 0.9 % 100 mL IVPB        2 g 200 mL/hr over 30 Minutes Intravenous  Once 01/05/21 2003 01/05/21 2043   01/05/21 2015  metroNIDAZOLE (FLAGYL) IVPB 500 mg        500 mg 100 mL/hr over 60 Minutes Intravenous  Once 01/05/21 2003 01/05/21 2147   01/05/21 2015  vancomycin (VANCOCIN) IVPB 1000 mg/200 mL premix        1,000 mg 200 mL/hr over 60 Minutes Intravenous  Once 01/05/21 2003 01/05/21 2336             Family Communication/Anticipated D/C date and plan/Code Status   DVT prophylaxis: heparin injection 5,000 Units Start: 01/05/21 2245 SCDs Start: 01/05/21 2234     Code Status: Full Code  Family Communication: None Disposition Plan:    Status is: Inpatient  Remains inpatient appropriate because:IV treatments appropriate due to intensity of illness or inability to take PO and Inpatient level of care appropriate due to severity of illness   Dispo: The patient is from: Home              Anticipated d/c is to: Home              Patient currently is not medically stable to d/c.   Difficult to place patient No           Subjective:   Interval events noted.  He is sedated and unable to provide any history.  There is a sitter at the bedside.  According to sitter, patient has been confused.  Objective:    Vitals:   01/10/21 0500 01/10/21 0950 01/10/21 1300 01/10/21 1310  BP:  (!) 143/82 (!) 145/90   Pulse:  94 90 88  Resp:  19 18 16   Temp: 97.7 F (36.5 C) 97.9 F (36.6 C) 98 F (36.7 C)   TempSrc: Oral Oral Axillary   SpO2:  96% 95% 94%  Weight:      Height:       No data found.   Intake/Output Summary (Last 24 hours) at 01/10/2021 1620 Last data filed at 01/10/2021 1258 Gross per 24 hour  Intake 3424.12 ml  Output 3950 ml  Net -525.88 ml   Filed Weights   01/08/21 0500 01/09/21 0500 01/10/21 0350   Weight: 82.1 kg 85.9 kg 87.3 kg    Exam:  GEN: NAD SKIN: Wound on forehead EYES: No acute abnormality ENT: MMM CV: RRR PULM: CTA B ABD: soft, ND, NT, +BS CNS: Sedated but arousable EXT: No edema or tenderness          Data Reviewed:   I have personally reviewed  following labs and imaging studies:  Labs: Labs show the following:   Basic Metabolic Panel: Recent Labs  Lab 01/06/21 1127 01/06/21 1541 01/07/21 0500 01/07/21 1145 01/08/21 0521 01/08/21 2028 01/09/21 0317 01/10/21 0332  NA 147*   < > 148* 133* 140  --  146* 139  K 4.1   < > 3.7 3.8 3.5  --  3.5 4.2  CL 107   < > 110 101 102  --  110 105  CO2 30   < > 27 23 29   --  28 25  GLUCOSE 286*   < > 233* 632* 150* 303* 155* 320*  BUN 52*   < > 53* 54* 37*  --  21* 22*  CREATININE 2.19*   < > 1.84* 1.69* 1.55*  --  1.32* 1.18  CALCIUM 9.6   < > 8.8* 7.4* 8.3*  --  8.4* 7.9*  MG 2.6*  --  2.4  --  2.0  --  2.0 1.8  PHOS 4.2  --  3.4  --  2.2*  --  2.2* 2.7   < > = values in this interval not displayed.   GFR Estimated Creatinine Clearance: 83.1 mL/min (by C-G formula based on SCr of 1.18 mg/dL). Liver Function Tests: Recent Labs  Lab 01/05/21 2001 01/07/21 0500  AST 18 43*  ALT 17 15  ALKPHOS 122 70  BILITOT 1.8* 0.7  PROT 8.3* 6.7  ALBUMIN 3.4* 2.5*   Recent Labs  Lab 01/05/21 2001  LIPASE 54*   Recent Labs  Lab 01/05/21 2001  AMMONIA 26   Coagulation profile No results for input(s): INR, PROTIME in the last 168 hours.  CBC: Recent Labs  Lab 01/05/21 2001 01/06/21 0030 01/06/21 1127 01/07/21 0500 01/08/21 0521 01/09/21 0317 01/10/21 0332  WBC 25.5*   < > 19.3* 16.9* 17.7* 12.3* 12.5*  NEUTROABS 21.0*  --   --   --  13.8* 9.0* 8.7*  HGB 14.2   < > 12.5* 12.4* 13.1 11.3* 10.9*  HCT 42.1   < > 35.2* 37.1* 38.8* 34.9* 34.0*  MCV 105.0*   < > 98.6 104.8* 103.5* 106.7* 109.3*  PLT 395   < > 281 240 286 216 156   < > = values in this interval not displayed.   Cardiac  Enzymes: No results for input(s): CKTOTAL, CKMB, CKMBINDEX, TROPONINI in the last 168 hours. BNP (last 3 results) No results for input(s): PROBNP in the last 8760 hours. CBG: Recent Labs  Lab 01/09/21 2346 01/10/21 0341 01/10/21 0715 01/10/21 1125 01/10/21 1515  GLUCAP 134* 341* 377* 71 216*   D-Dimer: No results for input(s): DDIMER in the last 72 hours. Hgb A1c: No results for input(s): HGBA1C in the last 72 hours. Lipid Profile: No results for input(s): CHOL, HDL, LDLCALC, TRIG, CHOLHDL, LDLDIRECT in the last 72 hours. Thyroid function studies: No results for input(s): TSH, T4TOTAL, T3FREE, THYROIDAB in the last 72 hours.  Invalid input(s): FREET3 Anemia work up: No results for input(s): VITAMINB12, FOLATE, FERRITIN, TIBC, IRON, RETICCTPCT in the last 72 hours. Sepsis Labs: Recent Labs  Lab 01/05/21 2001 01/05/21 2225 01/06/21 0030 01/06/21 0627 01/06/21 1127 01/07/21 0500 01/08/21 0521 01/09/21 0317 01/10/21 0332  PROCALCITON 0.78  --   --   --   --  0.52 0.29  --   --   WBC 25.5*  --    < > 21.4*   < > 16.9* 17.7* 12.3* 12.5*  LATICACIDVEN 2.7* 2.4*  --  2.3*  --   --   --   --   --    < > =  values in this interval not displayed.    Microbiology Recent Results (from the past 240 hour(s))  Culture, blood (routine x 2)     Status: Abnormal   Collection Time: 01/05/21  8:01 PM   Specimen: BLOOD  Result Value Ref Range Status   Specimen Description   Final    BLOOD BLOOD RIGHT FOREARM Performed at [facility], [address] [place], [place] [address]    Special Requests   Final    BOTTLES DRAWN AEROBIC AND ANAEROBIC Blood Culture results may not be optimal due to an excessive volume of blood received in culture bottles Performed at [facility], [address] [place], [place] [address]    Culture  Setup Time   Final    Organism ID to follow GRAM NEGATIVE RODS IN BOTH AEROBIC AND ANAEROBIC BOTTLES CRITICAL RESULT CALLED TO, READ  BACK BY AND VERIFIED WITH: LISA KLUTTZ AT 1046 01/06/21 SDR Performed at [facility], [address], [place], [place] [address]    Culture ESCHERICHIA COLI (A)  Final   Report Status 01/08/2021 FINAL  Final   Organism ID, Bacteria ESCHERICHIA COLI  Final      Susceptibility   Escherichia coli - MIC*    AMPICILLIN >=32 RESISTANT Resistant     CEFAZOLIN <=4 SENSITIVE Sensitive     CEFEPIME <=0.12 SENSITIVE Sensitive     CEFTAZIDIME <=1 SENSITIVE Sensitive     CEFTRIAXONE <=0.25 SENSITIVE Sensitive     CIPROFLOXACIN <=0.25 SENSITIVE Sensitive     GENTAMICIN <=1 SENSITIVE Sensitive     IMIPENEM <=0.25 SENSITIVE Sensitive     TRIMETH/SULFA <=20 SENSITIVE Sensitive     AMPICILLIN/SULBACTAM >=32 RESISTANT Resistant     PIP/TAZO <=4 SENSITIVE Sensitive     * ESCHERICHIA COLI  Culture, blood (routine x 2)     Status: None   Collection Time: 01/05/21  8:01 PM   Specimen: BLOOD  Result Value Ref Range Status   Specimen Description BLOOD BLOOD LEFT FOREARM  Final   Special Requests   Final    BOTTLES DRAWN AEROBIC AND ANAEROBIC Blood Culture results may not be optimal due to an inadequate volume of blood received in culture bottles   Culture   Final    NO GROWTH 5 DAYS Performed at [facility], [address] [place], [place] [address]    Report Status 01/10/2021 FINAL  Final  Blood Culture ID Panel (Reflexed)     Status: Abnormal   Collection Time: 01/05/21  8:01 PM  Result Value Ref Range Status   Enterococcus faecalis NOT DETECTED NOT DETECTED Final   Enterococcus Faecium NOT DETECTED NOT DETECTED Final   Listeria monocytogenes NOT DETECTED NOT DETECTED Final   Staphylococcus species NOT DETECTED NOT DETECTED Final   Staphylococcus aureus (BCID) NOT DETECTED NOT DETECTED Final   Staphylococcus epidermidis NOT DETECTED NOT DETECTED Final   Staphylococcus lugdunensis NOT DETECTED NOT DETECTED Final   Streptococcus species NOT DETECTED NOT DETECTED Final    Streptococcus agalactiae NOT DETECTED NOT DETECTED Final   Streptococcus pneumoniae NOT DETECTED NOT DETECTED Final   Streptococcus pyogenes NOT DETECTED NOT DETECTED Final   A.calcoaceticus-baumannii NOT DETECTED NOT DETECTED Final   Bacteroides fragilis NOT DETECTED NOT DETECTED Final   Enterobacterales DETECTED (A) NOT DETECTED Final    Comment: Enterobacterales represent a large order of gram negative bacteria, not a single organism. CRITICAL RESULT CALLED TO, READ BACK BY AND VERIFIED WITH:  LISA KLUTTZ AT 1046 01/06/21 SDR  Enterobacter cloacae complex NOT DETECTED NOT DETECTED Final   Escherichia coli DETECTED (A) NOT DETECTED Final    Comment: CRITICAL RESULT CALLED TO, READ BACK BY AND VERIFIED WITH:  LISA KLUTTZ AT 1046 01/06/21 SDR    Klebsiella aerogenes NOT DETECTED NOT DETECTED Final   Klebsiella oxytoca NOT DETECTED NOT DETECTED Final   Klebsiella pneumoniae NOT DETECTED NOT DETECTED Final   Proteus species NOT DETECTED NOT DETECTED Final   Salmonella species NOT DETECTED NOT DETECTED Final   Serratia marcescens NOT DETECTED NOT DETECTED Final   Haemophilus influenzae NOT DETECTED NOT DETECTED Final   Neisseria meningitidis NOT DETECTED NOT DETECTED Final   Pseudomonas aeruginosa NOT DETECTED NOT DETECTED Final   Stenotrophomonas maltophilia NOT DETECTED NOT DETECTED Final   Candida albicans NOT DETECTED NOT DETECTED Final   Candida auris NOT DETECTED NOT DETECTED Final   Candida glabrata NOT DETECTED NOT DETECTED Final   Candida krusei NOT DETECTED NOT DETECTED Final   Candida parapsilosis NOT DETECTED NOT DETECTED Final   Candida tropicalis NOT DETECTED NOT DETECTED Final   Cryptococcus neoformans/gattii NOT DETECTED NOT DETECTED Final   CTX-M ESBL NOT DETECTED NOT DETECTED Final   Carbapenem resistance IMP NOT DETECTED NOT DETECTED Final   Carbapenem resistance KPC NOT DETECTED NOT DETECTED Final   Carbapenem resistance NDM NOT DETECTED NOT DETECTED Final    Carbapenem resist OXA 48 LIKE NOT DETECTED NOT DETECTED Final   Carbapenem resistance VIM NOT DETECTED NOT DETECTED Final    Comment: Performed at [facility], [address] [place], [place] [address]  Resp Panel by RT-PCR (Flu A&B, Covid) Nasopharyngeal Swab     Status: None   Collection Time: 01/05/21  8:10 PM   Specimen: Nasopharyngeal Swab; Nasopharyngeal(NP) swabs in vial transport medium  Result Value Ref Range Status   SARS Coronavirus 2 by RT PCR NEGATIVE NEGATIVE Final    Comment: (NOTE) SARS-CoV-2 target nucleic acids are NOT DETECTED.  The SARS-CoV-2 RNA is generally detectable in upper respiratory specimens during the acute phase of infection. The lowest concentration of SARS-CoV-2 viral copies this assay can detect is 138 copies/mL. A negative result does not preclude SARS-Cov-2 infection and should not be used as the sole basis for treatment or other patient management decisions. A negative result may occur with  improper specimen collection/handling, submission of specimen other than nasopharyngeal swab, presence of viral mutation(s) within the areas targeted by this assay, and inadequate number of viral copies(<138 copies/mL). A negative result must be combined with clinical observations, patient history, and epidemiological information. The expected result is Negative.  Fact Sheet for Patients:  BloggerCourse.com  Fact Sheet for Healthcare Providers:  SeriousBroker.it  This test is no t yet approved or cleared by the [place] FDA and  has been authorized for detection and/or diagnosis of SARS-CoV-2 by FDA under an Emergency Use Authorization (EUA). This EUA will remain  in effect (meaning this test can be used) for the duration of the COVID-19 declaration under Section 564(b)(1) of the Act, 21 U.S.C.section 360bbb-3(b)(1), unless the authorization is terminated  or revoked sooner.        Influenza A by PCR NEGATIVE NEGATIVE Final   Influenza B by PCR NEGATIVE NEGATIVE Final    Comment: (NOTE) The Xpert Xpress SARS-CoV-2/FLU/RSV plus assay is intended as an aid in the diagnosis of influenza from Nasopharyngeal swab specimens and should not be used as a sole basis for treatment. Nasal washings and aspirates are unacceptable for Xpert Xpress SARS-CoV-2/FLU/RSV testing.  Fact Sheet for Patients: BloggerCourse.com  Fact Sheet for Healthcare Providers: SeriousBroker.it  This test is not yet approved or cleared by the [place] FDA and has been authorized for detection and/or diagnosis of SARS-CoV-2 by FDA under an Emergency Use Authorization (EUA). This EUA will remain in effect (meaning this test can be used) for the duration of the COVID-19 declaration under Section 564(b)(1) of the Act, 21 U.S.C. section 360bbb-3(b)(1), unless the authorization is terminated or revoked.  Performed at [facility], [address] [place], [place] [address]   Urine culture     Status: Abnormal   Collection Time: 01/05/21  9:10 PM   Specimen: In/Out Cath Urine  Result Value Ref Range Status   Specimen Description   Final    IN/OUT CATH URINE Performed at [facility], [address], [place], [place] [address]    Special Requests   Final    NONE Performed at [facility], [address] [place], [place] [address]    Culture MULTIPLE SPECIES PRESENT, SUGGEST RECOLLECTION (A)  Final   Report Status 01/07/2021 FINAL  Final  MRSA PCR Screening     Status: Abnormal   Collection Time: 01/06/21  5:00 AM   Specimen: Nasopharyngeal  Result Value Ref Range Status   MRSA by PCR POSITIVE (A) NEGATIVE Final    Comment:        The GeneXpert MRSA Assay (FDA approved for NASAL specimens only), is one component of a comprehensive MRSA colonization surveillance program. It is not intended to diagnose  MRSA infection nor to guide or monitor treatment for MRSA infections. RESULT CALLED TO, READ BACK BY AND VERIFIED WITH:  [facility] AT 0710 01/06/21 SDR Performed at [facility], [address] [place], [place] [address]     Procedures and diagnostic studies:  No results found.             LOS: 5 days   Cristina Ceniceros  [facility]   Pager on [email or web address]. If 7PM-7AM, please contact night-coverage at [email or web address]     01/10/2021, 4:20 PM

## 2021-01-11 ENCOUNTER — Other Ambulatory Visit: Payer: Self-pay

## 2021-01-11 DIAGNOSIS — A4151 Sepsis due to Escherichia coli [E. coli]: Principal | ICD-10-CM

## 2021-01-11 LAB — PHOSPHORUS: Phosphorus: 2.6 mg/dL (ref 2.5–4.6)

## 2021-01-11 LAB — BASIC METABOLIC PANEL
Anion gap: 8 (ref 5–15)
BUN: 18 mg/dL (ref 6–20)
CO2: 25 mmol/L (ref 22–32)
Calcium: 8.4 mg/dL — ABNORMAL LOW (ref 8.9–10.3)
Chloride: 105 mmol/L (ref 98–111)
Creatinine, Ser: 1.08 mg/dL (ref 0.61–1.24)
GFR, Estimated: >60 mL/min (ref >60)
Glucose, Bld: 211 mg/dL — ABNORMAL HIGH (ref 70–99)
Potassium: 4 mmol/L (ref 3.5–5.1)
Sodium: 138 mmol/L (ref 135–145)

## 2021-01-11 LAB — GLUCOSE, CAPILLARY
Glucose-Capillary: >600 mg/dL (ref 70–99)
Glucose-Capillary: 582 mg/dL (ref 70–99)
Glucose-Capillary: 179 mg/dL — ABNORMAL HIGH (ref 70–99)
Glucose-Capillary: 169 mg/dL — ABNORMAL HIGH (ref 70–99)
Glucose-Capillary: 295 mg/dL — ABNORMAL HIGH (ref 70–99)
Glucose-Capillary: 202 mg/dL — ABNORMAL HIGH (ref 70–99)
Glucose-Capillary: 511 mg/dL (ref 70–99)

## 2021-01-11 LAB — MAGNESIUM: Magnesium: 1.9 mg/dL (ref 1.7–2.4)

## 2021-01-11 MED ORDER — LOSARTAN POTASSIUM 50 MG PO TABS
50.0000 mg | ORAL_TABLET | Freq: Every day | ORAL | Status: DC
  Administered 2021-01-11 – 2021-01-16 (×6): 50 mg via ORAL
  Filled 2021-01-11 (×6): qty 1

## 2021-01-11 MED ORDER — INSULIN ASPART 100 UNIT/ML ~~LOC~~ SOLN
30.0000 [IU] | Freq: Once | SUBCUTANEOUS | Status: AC
  Administered 2021-01-11: 30 [IU] via SUBCUTANEOUS
  Filled 2021-01-11: qty 1

## 2021-01-11 NOTE — Consult Note (Signed)
PHARMACY CONSULT NOTE  Pharmacy Consult for Electrolyte Monitoring and Replacement   Recent Labs: Potassium (mmol/L)  Date Value  01/11/2021 4.0  08/18/2014 3.8   Magnesium (mg/dL)  Date Value  41/96/2229 1.9   Calcium (mg/dL)  Date Value  79/89/2119 8.4 (L)   Calcium, Total (mg/dL)  Date Value  41/74/0814 8.4 (L)   Albumin (g/dL)  Date Value  48/18/5631 2.5 (L)  08/18/2014 3.9   Phosphorus (mg/dL)  Date Value  49/70/2637 2.6   Sodium (mmol/L)  Date Value  01/11/2021 138  08/18/2014 142   Corrected Ca: 9.6 mg/dL  Assessment: 50 yo M presented with AMS. Pt found to have HHS and e. Coli bacteremia. PMH includes HTN, diabetes, alcohol abuse, polysubstance abuse.  He was hyperglycemic on admission which has improved. The patient currently has transfer order to go to the med-surg unit today from the ICU.  Pharmacy has been consulted for monitoring of electrolytes  Goal of Therapy:  Electrolytes WNL  Plan:   No electrolyte replacement warranted for today  Because this electrolyte consult was generated as part of the CCU admission order set pharmacy will sign off at this time  Lowella Bandy, PharmD, BCPS Clinical Pharmacist 01/11/2021 10:57 AM

## 2021-01-11 NOTE — Progress Notes (Signed)
Nutrition Follow Up note   DOCUMENTATION CODES:   Non-severe (moderate) malnutrition in context of chronic illness  INTERVENTION:   Ensure Max protein supplement BID, each supplement provides 150kcal and 30g of protein.  MVI po daily   NUTRITION DIAGNOSIS:   Moderate Malnutrition related to chronic illness (uncontrolled DM) as evidenced by mild fat depletion,moderate fat depletion,mild muscle depletion,moderate muscle depletion.  GOAL:   Patient will meet greater than or equal to 90% of their needs -progressing   MONITOR:   PO intake,Supplement acceptance,Labs,Weight trends,Skin,I & O's  ASSESSMENT:   Pt with PMH of DM, HTN, Pancreatitis, anxiety, polysubstance abuse (crack, cocaine), and ETOH abuse admitted with acute metabolic encephalopathy, possible sepsis due to UTI and HHNK requiring insulin drip.   Pt initiated on a diet 4/2. Pt with continued confusion and agitation at times. Pt documented to be eating 75% of meals yesterday. Pt is drinking Ensure supplements. Refeed labs stable. Recommend continue MVI and supplements. Per chart, pt up ~12lbs since admit but appears to be back to his UBW.   Medications reviewed and include: folic acid, heparin, insulin, MVI, protonix, cefazolin  Labs reviewed: K 4.0 wnl, P 2.6 wnl, Mg 1.9 wnl Wbc- 12.5(H), Hgb 10.9(L), Hct 34.0(L) cbgs- 169, 295 x 24 hrs  Diet Order:   Diet Order            Diet Carb Modified Fluid consistency: Thin; Room service appropriate? Yes  Diet effective now                EDUCATION NEEDS:   Not appropriate for education at this time  Skin:  Skin Assessment: Reviewed RN Assessment  Last BM:  4/6- type 6  Height:   Ht Readings from Last 1 Encounters:  01/05/21 6' (1.829 m)    Weight:   Wt Readings from Last 1 Encounters:  01/10/21 87.3 kg    Ideal Body Weight:  80.9 kg  BMI:  Body mass index is 26.1 kg/m.  Estimated Nutritional Needs:   Kcal:  2100-2300  Protein:  100-115  grams  Fluid:  >2 L/day  Betsey Holiday MS, RD, LDN Please refer to [facility] for RD and/or RD on-call/weekend/after hours pager

## 2021-01-11 NOTE — Plan of Care (Signed)

## 2021-01-11 NOTE — Progress Notes (Signed)
PROGRESS NOTE    Andrew Buchanan  ZOX:[id number] DOB: [birth year] DOA: 01/05/2021 PCP: Patient, No Pcp Per (Inactive)  Brief Narrative:  50 y.o. male with medical history significant for type II DM, hypertension, pancreatitis, anxiety, alcohol use disorder, cocaine abuse, who was brought to the hospital because of altered mental status.  He was found to have hyperosmolar hyperglycemic state, severe sepsis and AKI.  He was admitted to the ICU for further management.  Additional work-up revealed E. coli bacteremia.  He was treated with IV insulin infusion, IV fluids and empiric IV antibiotics.  He was transferred to the hospitalist service on 01/08/2021.  Hospital course was complicated by acute toxic metabolic encephalopathy/delirium/alcohol withdrawal syndrome.  4/6: Patient appears to be slowly improving.  Still requiring Ativan per CIWA protocol but off Precedex infusion.  Appears to be mentating relatively clearly.  Sitter remains at bedside.  Patient somewhat tangential needs redirection.  Hemodynamically stable.  Tolerating p.o.   Assessment & Plan:   Principal Problem:   Sepsis with acute renal failure without septic shock (HCC) Active Problems:   Hyperosmolar hyperglycemic state (HHS) (HCC)   Malnutrition of moderate degree   AKI (acute kidney injury) (HCC)  Type 2 diabetes mellitus complicated by hyperosmolar hyperglycemic state Status post IV insulin, now titrated off Reasonable control over interval HbA1c greater than 15.5 indicating dismal control Plan: Lantus 40 units daily NovoLog 12 units 3 times daily AC Sliding scale Carb modified diet Titrate glycemic regimen to achieve BG 140-180 Diabetes coordinator consult  Severe sepsis secondary to E. coli bacteremia Source of bacteremia is unclear Urine culture with multiple species On IV Ancef, will continue Plan for 7-day total course  Acute kidney injury In the setting of shock and encephalopathy Creatinine  downtrending No IV fluids necessary Encourage p.o. fluid intake Monitor daily BMP  Alcohol use disorder Acute alcohol withdrawal Acute toxic metabolic encephalopathy secondary to above Required Precedex drip now titrated off Continue Librium and Ativan as needed per CIWA protocol As needed Haldol for agitation  Cocaine abuse Counseled patient     DVT prophylaxis: SQ heparin Code Status: Full Family Communication: None today Disposition Plan:Status is: Inpatient  Remains inpatient appropriate because:Inpatient level of care appropriate due to severity of illness   Dispo: The patient is from: Home              Anticipated d/c is to: Home              Patient currently is not medically stable to d/c.   Difficult to place patient No   Withdrawal symptoms appear to be improving.  Patient remains somewhat altered and tangential.  Continuing treatment under CIWA protocol and on IV antibiotics for gram-negative bacteremia.  Disposition plan pending.    Level of care: Med-Surg  Consultants:   None  Procedures:   None  Antimicrobials:   Cefazolin   Subjective: Patient seen and examined.  Sitting up in bed.  Answers questions appropriately but thought process tangential.  Objective: Vitals:   01/11/21 0700 01/11/21 0720 01/11/21 0727 01/11/21 1500  BP: 110/81   134/88  Pulse: 84  90 79  Resp: 19   18  Temp:  98.2 F (36.8 C)  98.4 F (36.9 C)  TempSrc:  Oral  Oral  SpO2: 97%     Weight:      Height:        Intake/Output Summary (Last 24 hours) at 01/11/2021 1614 Last data filed at 01/11/2021 0803 Gross per 24  hour  Intake 380 ml  Output 900 ml  Net -520 ml   Filed Weights   01/08/21 0500 01/09/21 0500 01/10/21 0350  Weight: 82.1 kg 85.9 kg 87.3 kg    Examination:  General exam: Mildly anxious Respiratory system: Clear to auscultation. Respiratory effort normal. Cardiovascular system: S1 & S2 heard, RRR. No JVD, murmurs, rubs, gallops or clicks. No  pedal edema. Gastrointestinal system: Abdomen is nondistended, soft and nontender. No organomegaly or masses felt. Normal bowel sounds heard. Central nervous system: Alert and oriented. No focal neurological deficits. Extremities: Symmetric 5 x 5 power. Skin: No rashes, lesions or ulcers Psychiatry: Judgement and insight appear impaired. Mood & affect anxious.     Data Reviewed: I have personally reviewed following labs and imaging studies  CBC: Recent Labs  Lab 01/05/21 2001 01/06/21 0030 01/06/21 1127 01/07/21 0500 01/08/21 0521 01/09/21 0317 01/10/21 0332  WBC 25.5*   < > 19.3* 16.9* 17.7* 12.3* 12.5*  NEUTROABS 21.0*  --   --   --  13.8* 9.0* 8.7*  HGB 14.2   < > 12.5* 12.4* 13.1 11.3* 10.9*  HCT 42.1   < > 35.2* 37.1* 38.8* 34.9* 34.0*  MCV 105.0*   < > 98.6 104.8* 103.5* 106.7* 109.3*  PLT 395   < > 281 240 286 216 156   < > = values in this interval not displayed.   Basic Metabolic Panel: Recent Labs  Lab 01/07/21 0500 01/07/21 1145 01/08/21 0521 01/08/21 2028 01/09/21 0317 01/10/21 0332 01/11/21 0350  NA 148* 133* 140  --  146* 139 138  K 3.7 3.8 3.5  --  3.5 4.2 4.0  CL 110 101 102  --  110 105 105  CO2 --  GLUCOSE 233* 632* 150* 303* 155* 320* 211*  BUN 53* 54* 37*  --  21* 22* 18  CREATININE 1.84* 1.69* 1.55*  --  1.32* 1.18 1.08  CALCIUM 8.8* 7.4* 8.3*  --  8.4* 7.9* 8.4*  MG 2.4  --  2.0  --  2.0 1.8 1.9  PHOS 3.4  --  2.2*  --  2.2* 2.7 2.6   GFR: Estimated Creatinine Clearance: 90.8 mL/min (by C-G formula based on SCr of 1.08 mg/dL). Liver Function Tests: Recent Labs  Lab 01/05/21 2001 01/07/21 0500  AST 18 43*  ALT 17 15  ALKPHOS 122 70  BILITOT 1.8* 0.7  PROT 8.3* 6.7  ALBUMIN 3.4* 2.5*   Recent Labs  Lab 01/05/21 2001  LIPASE 54*   Recent Labs  Lab 01/05/21 2001  AMMONIA 26   Coagulation Profile: No results for input(s): INR, PROTIME in the last 168 hours. Cardiac Enzymes: No results for input(s):  CKTOTAL, CKMB, CKMBINDEX, TROPONINI in the last 168 hours. BNP (last 3 results) No results for input(s): PROBNP in the last 8760 hours. HbA1C: No results for input(s): HGBA1C in the last 72 hours. CBG: Recent Labs  Lab 01/10/21 2357 01/11/21 0447 01/11/21 0733 01/11/21 1132 01/11/21 1603  GLUCAP 385* 179* 169* 295* 202*   Lipid Profile: No results for input(s): CHOL, HDL, LDLCALC, TRIG, CHOLHDL, LDLDIRECT in the last 72 hours. Thyroid Function Tests: No results for input(s): TSH, T4TOTAL, FREET4, T3FREE, THYROIDAB in the last 72 hours. Anemia Panel: Recent Labs    01/10/21 0332  VITAMINB12 1,817*  FOLATE 6.9   Sepsis Labs: Recent Labs  Lab 01/05/21 2001 01/05/21 2225 01/06/21 0627 01/07/21 0500 01/08/21 0521  PROCALCITON 0.78  --   --  0.52 0.29  LATICACIDVEN 2.7* 2.4* 2.3*  --   --     Recent Results (from the past 240 hour(s))  Culture, blood (routine x 2)     Status: Abnormal   Collection Time: 01/05/21  8:01 PM   Specimen: BLOOD  Result Value Ref Range Status   Specimen Description   Final    BLOOD BLOOD RIGHT FOREARM Performed at [facility], [address], [place], [place] [address]    Special Requests   Final    BOTTLES DRAWN AEROBIC AND ANAEROBIC Blood Culture results may not be optimal due to an excessive volume of blood received in culture bottles Performed at [facility], [address] [place], [place] [address]    Culture  Setup Time   Final    Organism ID to follow GRAM NEGATIVE RODS IN BOTH AEROBIC AND ANAEROBIC BOTTLES CRITICAL RESULT CALLED TO, READ BACK BY AND VERIFIED WITH: LISA KLUTTZ AT 1046 01/06/21 SDR Performed at [facility], [address] [place], [place] [address]    Culture ESCHERICHIA COLI (A)  Final   Report Status 01/08/2021 FINAL  Final   Organism ID, Bacteria ESCHERICHIA COLI  Final      Susceptibility   Escherichia coli - MIC*    AMPICILLIN >=32 RESISTANT Resistant     CEFAZOLIN <=4  SENSITIVE Sensitive     CEFEPIME <=0.12 SENSITIVE Sensitive     CEFTAZIDIME <=1 SENSITIVE Sensitive     CEFTRIAXONE <=0.25 SENSITIVE Sensitive     CIPROFLOXACIN <=0.25 SENSITIVE Sensitive     GENTAMICIN <=1 SENSITIVE Sensitive     IMIPENEM <=0.25 SENSITIVE Sensitive     TRIMETH/SULFA <=20 SENSITIVE Sensitive     AMPICILLIN/SULBACTAM >=32 RESISTANT Resistant     PIP/TAZO <=4 SENSITIVE Sensitive     * ESCHERICHIA COLI  Culture, blood (routine x 2)     Status: None   Collection Time: 01/05/21  8:01 PM   Specimen: BLOOD  Result Value Ref Range Status   Specimen Description BLOOD BLOOD LEFT FOREARM  Final   Special Requests   Final    BOTTLES DRAWN AEROBIC AND ANAEROBIC Blood Culture results may not be optimal due to an inadequate volume of blood received in culture bottles   Culture   Final    NO GROWTH 5 DAYS Performed at [facility], [address] [place], [place] [address]    Report Status 01/10/2021 FINAL  Final  Blood Culture ID Panel (Reflexed)     Status: Abnormal   Collection Time: 01/05/21  8:01 PM  Result Value Ref Range Status   Enterococcus faecalis NOT DETECTED NOT DETECTED Final   Enterococcus Faecium NOT DETECTED NOT DETECTED Final   Listeria monocytogenes NOT DETECTED NOT DETECTED Final   Staphylococcus species NOT DETECTED NOT DETECTED Final   Staphylococcus aureus (BCID) NOT DETECTED NOT DETECTED Final   Staphylococcus epidermidis NOT DETECTED NOT DETECTED Final   Staphylococcus lugdunensis NOT DETECTED NOT DETECTED Final   Streptococcus species NOT DETECTED NOT DETECTED Final   Streptococcus agalactiae NOT DETECTED NOT DETECTED Final   Streptococcus pneumoniae NOT DETECTED NOT DETECTED Final   Streptococcus pyogenes NOT DETECTED NOT DETECTED Final   A.calcoaceticus-baumannii NOT DETECTED NOT DETECTED Final   Bacteroides fragilis NOT DETECTED NOT DETECTED Final   Enterobacterales DETECTED (A) NOT DETECTED Final    Comment: Enterobacterales represent  a large order of gram negative bacteria, not a single organism. CRITICAL RESULT CALLED TO, READ BACK BY AND VERIFIED WITH:  LISA KLUTTZ AT 1046 01/06/21 SDR    Enterobacter cloacae complex NOT DETECTED NOT DETECTED Final   Escherichia coli DETECTED (A) NOT DETECTED Final    Comment: CRITICAL RESULT CALLED TO, READ BACK BY AND VERIFIED WITH:  LISA KLUTTZ AT 1046 01/06/21 SDR    Klebsiella aerogenes NOT DETECTED NOT DETECTED Final   Klebsiella oxytoca NOT DETECTED NOT DETECTED Final   Klebsiella pneumoniae NOT DETECTED NOT DETECTED Final   Proteus species NOT DETECTED NOT DETECTED Final   Salmonella species NOT DETECTED NOT DETECTED Final   Serratia marcescens NOT DETECTED NOT DETECTED Final   Haemophilus influenzae NOT DETECTED NOT DETECTED Final   Neisseria meningitidis NOT DETECTED NOT DETECTED Final   Pseudomonas aeruginosa NOT DETECTED NOT DETECTED Final   Stenotrophomonas maltophilia NOT DETECTED NOT DETECTED Final   Candida albicans NOT DETECTED NOT DETECTED Final   Candida auris NOT DETECTED NOT DETECTED Final   Candida glabrata NOT DETECTED NOT DETECTED Final   Candida krusei NOT DETECTED NOT DETECTED Final   Candida parapsilosis NOT DETECTED NOT DETECTED Final   Candida tropicalis NOT DETECTED NOT DETECTED Final   Cryptococcus neoformans/gattii NOT DETECTED NOT DETECTED Final   CTX-M ESBL NOT DETECTED NOT DETECTED Final   Carbapenem resistance IMP NOT DETECTED NOT DETECTED Final   Carbapenem resistance KPC NOT DETECTED NOT DETECTED Final   Carbapenem resistance NDM NOT DETECTED NOT DETECTED Final   Carbapenem resist OXA 48 LIKE NOT DETECTED NOT DETECTED Final   Carbapenem resistance VIM NOT DETECTED NOT DETECTED Final    Comment: Performed at [facility], [address] [place], [place] [address]  Resp Panel by RT-PCR (Flu A&B, Covid) Nasopharyngeal Swab     Status: None   Collection Time: 01/05/21  8:10 PM   Specimen: Nasopharyngeal Swab; Nasopharyngeal(NP) swabs  in vial transport medium  Result Value Ref Range Status   SARS Coronavirus 2 by RT PCR NEGATIVE NEGATIVE Final    Comment: (NOTE) SARS-CoV-2 target nucleic acids are NOT DETECTED.  The SARS-CoV-2 RNA is generally detectable in upper respiratory specimens during the acute phase of infection. The lowest concentration of SARS-CoV-2 viral copies this assay can detect is 138 copies/mL. A negative result does not preclude SARS-Cov-2 infection and should not be used as the sole basis for treatment or other patient management decisions. A negative result may occur with  improper specimen collection/handling, submission of specimen other than nasopharyngeal swab, presence of viral mutation(s) within the areas targeted by this assay, and inadequate number of viral copies(<138 copies/mL). A negative result must be combined with clinical observations, patient history, and epidemiological information. The expected result is Negative.  Fact Sheet for Patients:  BloggerCourse.com  Fact Sheet for Healthcare Providers:  SeriousBroker.it  This test is no t yet approved or cleared by the [place] FDA and  has been authorized for detection and/or diagnosis of SARS-CoV-2 by FDA under an Emergency Use Authorization (EUA). This EUA will remain  in effect (meaning this test can be used) for the duration of the COVID-19 declaration under Section 564(b)(1) of the Act, 21 U.S.C.section 360bbb-3(b)(1), unless the authorization is terminated  or revoked sooner.       Influenza A by PCR NEGATIVE NEGATIVE Final   Influenza B by PCR NEGATIVE NEGATIVE Final    Comment: (NOTE) The Xpert Xpress SARS-CoV-2/FLU/RSV plus assay is intended as an aid in the diagnosis of influenza from Nasopharyngeal swab specimens and should not be used as a sole basis for treatment. Nasal washings and  aspirates are unacceptable for Xpert Xpress  SARS-CoV-2/FLU/RSV testing.  Fact Sheet for Patients: BloggerCourse.com  Fact Sheet for Healthcare Providers: SeriousBroker.it  This test is not yet approved or cleared by the [place] FDA and has been authorized for detection and/or diagnosis of SARS-CoV-2 by FDA under an Emergency Use Authorization (EUA). This EUA will remain in effect (meaning this test can be used) for the duration of the COVID-19 declaration under Section 564(b)(1) of the Act, 21 U.S.C. section 360bbb-3(b)(1), unless the authorization is terminated or revoked.  Performed at [facility], [address] [place], [place] [address]   Urine culture     Status: Abnormal   Collection Time: 01/05/21  9:10 PM   Specimen: In/Out Cath Urine  Result Value Ref Range Status   Specimen Description   Final    IN/OUT CATH URINE Performed at [facility], [address], [place], [place] [address]    Special Requests   Final    NONE Performed at [facility], [address] [place], [place] [address]    Culture MULTIPLE SPECIES PRESENT, SUGGEST RECOLLECTION (A)  Final   Report Status 01/07/2021 FINAL  Final  MRSA PCR Screening     Status: Abnormal   Collection Time: 01/06/21  5:00 AM   Specimen: Nasopharyngeal  Result Value Ref Range Status   MRSA by PCR POSITIVE (A) NEGATIVE Final    Comment:        The GeneXpert MRSA Assay (FDA approved for NASAL specimens only), is one component of a comprehensive MRSA colonization surveillance program. It is not intended to diagnose MRSA infection nor to guide or monitor treatment for MRSA infections. RESULT CALLED TO, READ BACK BY AND VERIFIED WITH:  [facility] AT 0710 01/06/21 SDR Performed at [facility], [address] [place], [place] [address]          Radiology Studies: No results found.      Scheduled Meds: . chlordiazePOXIDE  25 mg Oral QID  .  Chlorhexidine Gluconate Cloth  6 each Topical Daily  . folic acid  1 mg Oral Daily  . gabapentin  600 mg Oral BID  . heparin  5,000 Units Subcutaneous Q8H  . insulin aspart  0-20 Units Subcutaneous Q4H  . insulin aspart  12 Units Subcutaneous TID WC  . insulin glargine  40 Units Subcutaneous Daily  . insulin glargine  5 Units Subcutaneous Once  . losartan  50 mg Oral Daily  . multivitamin with minerals  1 tablet Oral Daily  . pantoprazole  40 mg Oral Daily  . Ensure Max Protein  11 oz Oral BID   Continuous Infusions: .  ceFAZolin (ANCEF) IV 2 g (01/11/21 1556)     LOS: 6 days    Time spent: 25 minutes    Tresa Moore, MD [facility] Pager [phone]  If 7PM-7AM, please contact night-coverage 01/11/2021, 4:14 PM

## 2021-01-12 ENCOUNTER — Encounter: Payer: Self-pay | Admitting: Internal Medicine

## 2021-01-12 LAB — BLOOD GAS, VENOUS
Acid-Base Excess: 2.9 mmol/L — ABNORMAL HIGH (ref 0.0–2.0)
Bicarbonate: 25.9 mmol/L (ref 20.0–28.0)
O2 Saturation: 89.8 %
Patient temperature: 37
pCO2, Ven: 34 mmHg — ABNORMAL LOW (ref 44.0–60.0)
pH, Ven: 7.49 — ABNORMAL HIGH (ref 7.250–7.430)
pO2, Ven: 53 mmHg — ABNORMAL HIGH (ref 32.0–45.0)

## 2021-01-12 LAB — GLUCOSE, CAPILLARY
Glucose-Capillary: 179 mg/dL — ABNORMAL HIGH (ref 70–99)
Glucose-Capillary: 193 mg/dL — ABNORMAL HIGH (ref 70–99)
Glucose-Capillary: 241 mg/dL — ABNORMAL HIGH (ref 70–99)
Glucose-Capillary: 258 mg/dL — ABNORMAL HIGH (ref 70–99)
Glucose-Capillary: 360 mg/dL — ABNORMAL HIGH (ref 70–99)
Glucose-Capillary: 488 mg/dL — ABNORMAL HIGH (ref 70–99)

## 2021-01-12 LAB — BASIC METABOLIC PANEL
Anion gap: 9 (ref 5–15)
BUN: 21 mg/dL — ABNORMAL HIGH (ref 6–20)
CO2: 25 mmol/L (ref 22–32)
Calcium: 8.5 mg/dL — ABNORMAL LOW (ref 8.9–10.3)
Chloride: 101 mmol/L (ref 98–111)
Creatinine, Ser: 1.11 mg/dL (ref 0.61–1.24)
GFR, Estimated: >60 mL/min (ref >60)
Glucose, Bld: 280 mg/dL — ABNORMAL HIGH (ref 70–99)
Potassium: 4.2 mmol/L (ref 3.5–5.1)
Sodium: 135 mmol/L (ref 135–145)

## 2021-01-12 MED ORDER — INSULIN ASPART 100 UNIT/ML ~~LOC~~ SOLN
0.0000 [IU] | Freq: Three times a day (TID) | SUBCUTANEOUS | Status: DC
  Administered 2021-01-12: 11 [IU] via SUBCUTANEOUS
  Administered 2021-01-12: 4 [IU] via SUBCUTANEOUS
  Administered 2021-01-12 – 2021-01-13 (×3): 20 [IU] via SUBCUTANEOUS
  Administered 2021-01-13: 4 [IU] via SUBCUTANEOUS
  Filled 2021-01-12 (×4): qty 1

## 2021-01-12 MED ORDER — INSULIN ASPART 100 UNIT/ML ~~LOC~~ SOLN: 0.0000 [IU] | Freq: Three times a day (TID) | SUBCUTANEOUS | Status: DC

## 2021-01-12 MED ORDER — INSULIN GLARGINE 100 UNIT/ML ~~LOC~~ SOLN
10.0000 [IU] | Freq: Every day | SUBCUTANEOUS | Status: DC
  Administered 2021-01-12: 10 [IU] via SUBCUTANEOUS
  Filled 2021-01-12 (×2): qty 0.1

## 2021-01-12 MED ORDER — INSULIN GLARGINE 100 UNIT/ML ~~LOC~~ SOLN
45.0000 [IU] | Freq: Every day | SUBCUTANEOUS | Status: DC
  Administered 2021-01-12 – 2021-01-13 (×2): 45 [IU] via SUBCUTANEOUS
  Filled 2021-01-12 (×3): qty 0.45

## 2021-01-12 MED ORDER — ALUM & MAG HYDROXIDE-SIMETH 200-200-20 MG/5ML PO SUSP
30.0000 mL | Freq: Four times a day (QID) | ORAL | Status: DC | PRN
  Filled 2021-01-12: qty 30

## 2021-01-12 MED ORDER — INSULIN ASPART 100 UNIT/ML ~~LOC~~ SOLN
15.0000 [IU] | Freq: Three times a day (TID) | SUBCUTANEOUS | Status: DC
  Administered 2021-01-12 – 2021-01-13 (×5): 15 [IU] via SUBCUTANEOUS
  Filled 2021-01-12 (×4): qty 1

## 2021-01-12 NOTE — Progress Notes (Signed)
Patient refused to be on tele. We needed to changed probe, but patient does not want to change it.

## 2021-01-12 NOTE — Progress Notes (Signed)
Inpatient Diabetes Program Recommendations  AACE/ADA: New Consensus Statement on Inpatient Glycemic Control  Target Ranges:  Prepandial:   less than 140 mg/dL      Peak postprandial:   less than 180 mg/dL (1-2 hours)      Critically ill patients:  140 - 180 mg/dL   Results for TYSHON, FANNING (MRN [id number]) as of 01/12/2021 07:28  Ref. Range 01/11/2021 07:33 01/11/2021 11:32 01/11/2021 16:03 01/11/2021 20:51 01/12/2021 00:23 01/12/2021 04:31 01/12/2021 07:27  Glucose-Capillary Latest Ref Range: 70 - 99 mg/dL 638 (H) 756 (H) 433 (H) 511 (HH) 488 (H) 241 (H) 193 (H)   Review of Glycemic Control  Diabetes history:DM2 Outpatient Diabetes medications:Lantus 35 units daily, Novolog 6 units TID with meals, Metformin 1000 mg BID Current orders for Inpatient glycemic control:Lantus 45 units Q24H, Novolog 15 units TID with meals, Novolog 0-20 units TID with meals  Inpatient Diabetes Program Recommendations:    Insulin: Noted Lantus increased from 40 to 45 units Q24H and meal coverage increased to Novolog 15 units TID  today. Please consider adding Lantus 10 units QHS and change frequency of Novolog 0-20 units to AC&HS (to include bedtime correction).  Thanks, Orlando Penner, RN, MSN, CDE Diabetes Coordinator Inpatient Diabetes Program [phone] (Team Pager from 8am to 5pm)
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
## 2021-01-12 NOTE — Progress Notes (Signed)
PT Cancellation Note  Patient Details Name: Andrew Buchanan MRN: [id number] DOB: [birth year]   Cancelled Treatment:    Reason Eval/Treat Not Completed: Other (comment). Consult received and chart reviewed. Pt sound asleep upon arrival with sitter stating he was up all night the night before. Sternal rubbing with him opening eyes briefly, however unable to participate in therapy and quickly falls back asleep. Will re-attempt another time.   Tijah Hane 01/12/2021, 1:24 PM  Elizabeth Palau, PT, DPT [phone]

## 2021-01-12 NOTE — Evaluation (Signed)
Occupational Therapy Evaluation Patient Details Name: Andrew Buchanan MRN: [id number] DOB: [birth year] Today's Date: 01/12/2021    History of Present Illness 50 y.o. male with medical history significant for type II DM, hypertension, pancreatitis, anxiety, alcohol use disorder, cocaine abuse, who was brought to the hospital because of altered mental status.  He was found to have hyperosmolar hyperglycemic state, severe sepsis and AKI.  He was admitted to the ICU for further management.  Hospital course was complicated by acute toxic metabolic encephalopathy/delirium/alcohol withdrawal syndrome.   Clinical Impression   Pt seen for OT evaluation this date. Prior to admission, pt was independent in all ADLs and functional mobility, living on the 2nd floor of a 2-story home with father. Pt currently requires SUPERVISION for sit<>stand transfers, SUPERVISION during standing grooming tasks, MIN GUARD for LB dressing sit>stand, and MIN A for functional mobility of ~75 feet w/out AD d/t decreased safety awareness (and impulsivity), balance, strength, and activity tolerance. Pt educated in falls prevention strategies and use of AD/DME for functional mobility, with pt verbalizing understanding. Pt would benefit from additional skilled OT services to maximize return to PLOF and improve safety awareness. Upon discharge, recommend SNF. With improved safety awareness, balance, and activity tolerance while admitted, pt may progress to home with home health and supervision/assistance from family.    Follow Up Recommendations  SNF;Supervision/Assistance - 24 hour    Equipment Recommendations  None recommended by OT       Precautions / Restrictions Precautions Precautions: Fall Restrictions Weight Bearing Restrictions: No      Mobility Bed Mobility Overal bed mobility: Independent                  Transfers Overall transfer level: Needs assistance   Transfers: Sit to/from Stand Sit to  Stand: Supervision         General transfer comment: Supervision in setting of impulsivity and pt reported dizziness    Balance Overall balance assessment: Needs assistance Sitting-balance support: No upper extremity supported;Feet supported Sitting balance-Leahy Scale: Normal Sitting balance - Comments: Normal sitting EOB   Standing balance support: No upper extremity supported;During functional activity Standing balance-Leahy Scale: Fair Standing balance comment: Able to walk ~150 feet w/out AD, however x2 LOB with pt able to regain balance following MIN A                           ADL either performed or assessed with clinical judgement   ADL Overall ADL's : Needs assistance/impaired     Grooming: Wash/dry hands;Supervision/safety;Standing               Lower Body Dressing: Min guard;Sit to/from stand Lower Body Dressing Details (indicate cue type and reason): To don socks via figure-4 position while standing with unilateral UE on wall for support             Functional mobility during ADLs: Minimal assistance                    Pertinent Vitals/Pain Pain Assessment: 0-10 Pain Score: 8  Pain Location: Side Pain Descriptors / Indicators: Discomfort Pain Intervention(s): Limited activity within patient's tolerance;Monitored during session        Extremity/Trunk Assessment Upper Extremity Assessment Upper Extremity Assessment: Overall WFL for tasks assessed   Lower Extremity Assessment Lower Extremity Assessment: Overall WFL for tasks assessed       Communication Communication Communication: No difficulties   Cognition Arousal/Alertness: Awake/alert Behavior During  Therapy: Impulsive Overall Cognitive Status: Impaired/Different from baseline                                 General Comments: Pt oriented to self, place, month/year, and some aspects of situation. Pt implusive at times however receptive to education  regarding safety awareness and falls prevention      Exercises General Exercises - Upper Extremity Shoulder Flexion: AROM;Strengthening;Both;10 reps;Supine General Exercises - Lower Extremity Ankle Circles/Pumps: AROM;Strengthening;Both;10 reps;Supine Heel Slides: AROM;Both;10 reps;Supine Hip ABduction/ADduction: AROM;Both;10 reps;Supine Other Exercises Other Exercises: Education provided regarding falls prevention strategies and use of DME during functional mobility     Home Living Family/patient expects to be discharged to:: Private residence Living Arrangements: Parent Available Help at Discharge: Family Type of Home: House Home Access: Stairs to enter Secretary/administrator of Steps: 4   Home Layout: Two level (Pt lives in [place] of home) Alternate Level Stairs-Number of Steps: 14 Alternate Level Stairs-Rails: Left Bathroom Shower/Tub: Chief Strategy Officer: Standard     Home Equipment: None          Prior Functioning/Environment Level of Independence: Independent        Comments: Pt was independent with ADLs and functional mobility prior to admission. Pt receives assistance from father for IADLs. Pt drives and used to ride motorbikes        OT Problem List: Decreased strength;Decreased activity tolerance;Impaired balance (sitting and/or standing);Decreased safety awareness      OT Treatment/Interventions: Self-care/ADL training;Therapeutic exercise;DME and/or AE instruction;Therapeutic activities;Patient/family education;Balance training    OT Goals(Current goals can be found in the care plan section) Acute Rehab OT Goals Patient Stated Goal: to walk OT Goal Formulation: With patient Time For Goal Achievement: 01/26/21 Potential to Achieve Goals: Good ADL Goals Pt Will Perform Grooming: with modified independence;standing Pt Will Perform Lower Body Bathing: with modified independence;sit to/from stand Pt Will Transfer to Toilet: with modified  independence;ambulating;regular height toilet  OT Frequency: Min 1X/week    AM-PAC OT "6 Clicks" Daily Activity     Outcome Measure Help from another person eating meals?: None Help from another person taking care of personal grooming?: A Little Help from another person toileting, which includes using toliet, bedpan, or urinal?: A Little Help from another person bathing (including washing, rinsing, drying)?: A Little Help from another person to put on and taking off regular upper body clothing?: A Little Help from another person to put on and taking off regular lower body clothing?: A Little 6 Click Score: 19   End of Session Equipment Utilized During Treatment: Gait belt;Rolling walker Nurse Communication: Mobility status  Activity Tolerance: Patient tolerated treatment well Patient left: in bed;with call bell/phone within reach;with bed alarm set;with nursing/sitter in room  OT Visit Diagnosis: Unsteadiness on feet (R26.81)                Time: 2585-2778 OT Time Calculation (min): 14 min Charges:  OT General Charges $OT Visit: 1 Visit OT Evaluation $OT Eval Moderate Complexity: 1 Mod  Matthew Folks, OTR/L [facility] [phone]

## 2021-01-12 NOTE — Progress Notes (Signed)
Order received from Dr Georgeann Oppenheim to renew sitter

## 2021-01-12 NOTE — Progress Notes (Signed)
OT Cancellation Note  Patient Details Name: Andrew Buchanan MRN: [id number] DOB: Oct 13, 1970   Cancelled Treatment:    Reason Eval/Treat Not Completed: Fatigue/lethargy limiting ability to participate. Orders received and chart reviewed. Upon arrival to room, pt sound asleep with sitter at beside. RN reporting that pt was up all night, recently given haldol, and likely to not be alert enough to fully participate in therapy this PM. OT to re-attempt at later time/date as able.  Matthew Folks, OTR/L [facility] [phone]

## 2021-01-12 NOTE — Progress Notes (Signed)
PROGRESS NOTE    Andrew Buchanan  ZOX:[id number] DOB: Jun 18, 1971 DOA: 01/05/2021 PCP: Patient, No Pcp Per (Inactive)  Brief Narrative:  50 y.o. male with medical history significant for type II DM, hypertension, pancreatitis, anxiety, alcohol use disorder, cocaine abuse, who was brought to the hospital because of altered mental status.  He was found to have hyperosmolar hyperglycemic state, severe sepsis and AKI.  He was admitted to the ICU for further management.  Additional work-up revealed E. coli bacteremia.  He was treated with IV insulin infusion, IV fluids and empiric IV antibiotics.  He was transferred to the hospitalist service on 01/08/2021.  Hospital course was complicated by acute toxic metabolic encephalopathy/delirium/alcohol withdrawal syndrome.  4/6: Patient appears to be slowly improving.  Still requiring Ativan per CIWA protocol but off Precedex infusion.  Appears to be mentating relatively clearly.  Sitter remains at bedside.  Patient somewhat tangential needs redirection.  Hemodynamically stable.  Tolerating p.o. 4/7: Transfer to medical floor.  Per RN at bedside sitter patient was up throughout the night this very sleepy during the day.  His p.o. intake seems to be improving.  Glycemic control remains a challenge.   Assessment & Plan:   Principal Problem:   Sepsis with acute renal failure without septic shock (HCC) Active Problems:   Hyperosmolar hyperglycemic state (HHS) (HCC)   Malnutrition of moderate degree   AKI (acute kidney injury) (HCC)  Type 2 diabetes mellitus complicated by hyperosmolar hyperglycemic state Status post IV insulin, now titrated off Poor control over interval HbA1c greater than 15.5 indicating dismal control Plan: Increase Lantus, 45 units every morning, 10 units every afternoon NovoLog 15 units 3 times daily before meals and at bedtime Resistant sliding scale Carb modified diet Titrate glycemic regimen to achieve BG 140-180 Diabetes  coordinator consult  Severe sepsis secondary to E. coli bacteremia Source of bacteremia is unclear Urine culture with multiple species Sepsis physiology improving On IV Ancef, will continue Plan for 7-day total course  Acute kidney injury In the setting of shock and encephalopathy Creatinine downtrending No IV fluids necessary Encourage p.o. fluid intake Monitor daily BMP  Alcohol use disorder Acute alcohol withdrawal Acute toxic metabolic encephalopathy secondary to above Required Precedex drip now titrated off Continue Librium and Ativan as needed per CIWA protocol As needed Haldol for agitation  Cocaine abuse Counseled patient     DVT prophylaxis: SQ heparin Code Status: Full Family Communication: None today.  Offered to call but patient declined Disposition Plan:Status is: Inpatient  Remains inpatient appropriate because:Inpatient level of care appropriate due to severity of illness   Dispo: The patient is from: Home              Anticipated d/c is to: Home              Patient currently is not medically stable to d/c.   Difficult to place patient No   Withdrawal symptoms slowly improving.  Patient remains somewhat altered and tangential in thought.  Continue treatment with CIWA protocol.  Therapy consults requested, TOC consult placed for substance abuse counseling education.  Disposition hopefully within the next 2 days.    Level of care: Med-Surg  Consultants:   None  Procedures:   None  Antimicrobials:   Cefazolin   Subjective: Patient seen and examined.  Sitting up in bed.  Answers questions appropriately but thought process tangential.  Objective: Vitals:   01/11/21 1929 01/12/21 0432 01/12/21 0500 01/12/21 0812  BP: 136/88 119/80  118/77  Pulse: 95 91  97  Resp: 20 18  18   Temp: 98.6 F (37 C) 98.1 F (36.7 C)  97.8 F (36.6 C)  TempSrc: Oral Oral  Oral  SpO2: 99% 99%  95%  Weight:   87.5 kg   Height:        Intake/Output  Summary (Last 24 hours) at 01/12/2021 1033 Last data filed at 01/12/2021 1006 Gross per 24 hour  Intake 1140 ml  Output 1700 ml  Net -560 ml   Filed Weights   01/09/21 0500 01/10/21 0350 01/12/21 0500  Weight: 85.9 kg 87.3 kg 87.5 kg    Examination:  General exam: Mildly anxious Respiratory system: Clear to auscultation. Respiratory effort normal. Cardiovascular system: S1 & S2 heard, RRR. No JVD, murmurs, rubs, gallops or clicks. No pedal edema. Gastrointestinal system: Abdomen is nondistended, soft and nontender. No organomegaly or masses felt. Normal bowel sounds heard. Central nervous system: Alert and oriented. No focal neurological deficits. Extremities: Symmetric 5 x 5 power. Skin: No rashes, lesions or ulcers Psychiatry: Judgement and insight appear impaired. Mood & affect anxious.     Data Reviewed: I have personally reviewed following labs and imaging studies  CBC: Recent Labs  Lab 01/05/21 2001 01/06/21 0030 01/06/21 1127 01/07/21 0500 01/08/21 0521 01/09/21 0317 01/10/21 0332  WBC 25.5*   < > 19.3* 16.9* 17.7* 12.3* 12.5*  NEUTROABS 21.0*  --   --   --  13.8* 9.0* 8.7*  HGB 14.2   < > 12.5* 12.4* 13.1 11.3* 10.9*  HCT 42.1   < > 35.2* 37.1* 38.8* 34.9* 34.0*  MCV 105.0*   < > 98.6 104.8* 103.5* 106.7* 109.3*  PLT 395   < > 281 240 286 216 156   < > = values in this interval not displayed.   Basic Metabolic Panel: Recent Labs  Lab 01/07/21 0500 01/07/21 1145 01/08/21 0521 01/08/21 2028 01/09/21 0317 01/10/21 0332 01/11/21 0350 01/12/21 0324  NA 148*   < > 140  --  146* 139 138 135  K 3.7   < > 3.5  --  3.5 4.2 4.0 4.2  CL 110   < > 102  --  110 105 105 101  CO2 27   < > 29  --  28 25 25 25   GLUCOSE 233*   < > 150* 303* 155* 320* 211* 280*  BUN 53*   < > 37*  --  21* 22* 18 21*  CREATININE 1.84*   < > 1.55*  --  1.32* 1.18 1.08 1.11  CALCIUM 8.8*   < > 8.3*  --  8.4* 7.9* 8.4* 8.5*  MG 2.4  --  2.0  --  2.0 1.8 1.9  --   PHOS 3.4  --  2.2*  --   2.2* 2.7 2.6  --    < > = values in this interval not displayed.   GFR: Estimated Creatinine Clearance: 88.4 mL/min (by C-G formula based on SCr of 1.11 mg/dL). Liver Function Tests: Recent Labs  Lab 01/05/21 2001 01/07/21 0500  AST 18 43*  ALT 17 15  ALKPHOS 122 70  BILITOT 1.8* 0.7  PROT 8.3* 6.7  ALBUMIN 3.4* 2.5*   Recent Labs  Lab 01/05/21 2001  LIPASE 54*   Recent Labs  Lab 01/05/21 2001  AMMONIA 26   Coagulation Profile: No results for input(s): INR, PROTIME in the last 168 hours. Cardiac Enzymes: No results for input(s): CKTOTAL, CKMB, CKMBINDEX, TROPONINI in the last 168 hours.  BNP (last 3 results) No results for input(s): PROBNP in the last 8760 hours. HbA1C: No results for input(s): HGBA1C in the last 72 hours. CBG: Recent Labs  Lab 01/11/21 1603 01/11/21 2051 01/12/21 0023 01/12/21 0431 01/12/21 0727  GLUCAP 202* 511* 488* 241* 193*   Lipid Profile: No results for input(s): CHOL, HDL, LDLCALC, TRIG, CHOLHDL, LDLDIRECT in the last 72 hours. Thyroid Function Tests: No results for input(s): TSH, T4TOTAL, FREET4, T3FREE, THYROIDAB in the last 72 hours. Anemia Panel: Recent Labs    01/10/21 0332  VITAMINB12 1,817*  FOLATE 6.9   Sepsis Labs: Recent Labs  Lab 01/05/21 2001 01/05/21 2225 01/06/21 0627 01/07/21 0500 01/08/21 0521  PROCALCITON 0.78  --   --  0.52 0.29  LATICACIDVEN 2.7* 2.4* 2.3*  --   --     Recent Results (from the past 240 hour(s))  Culture, blood (routine x 2)     Status: Abnormal   Collection Time: 01/05/21  8:01 PM   Specimen: BLOOD  Result Value Ref Range Status   Specimen Description   Final    BLOOD BLOOD RIGHT FOREARM Performed at [facility], [address] [place], [place] [address]    Special Requests   Final    BOTTLES DRAWN AEROBIC AND ANAEROBIC Blood Culture results may not be optimal due to an excessive volume of blood received in culture bottles Performed at [facility], [address] [place], [place] [address]    Culture  Setup Time   Final    Organism ID to follow GRAM NEGATIVE RODS IN BOTH AEROBIC AND ANAEROBIC BOTTLES CRITICAL RESULT CALLED TO, READ BACK BY AND VERIFIED WITH: LISA KLUTTZ AT 1046 01/06/21 SDR Performed at [facility], [address] [place], [place] [address]    Culture ESCHERICHIA COLI (A)  Final   Report Status 01/08/2021 FINAL  Final   Organism ID, Bacteria ESCHERICHIA COLI  Final      Susceptibility   Escherichia coli - MIC*    AMPICILLIN >=32 RESISTANT Resistant     CEFAZOLIN <=4 SENSITIVE Sensitive     CEFEPIME <=0.12 SENSITIVE Sensitive     CEFTAZIDIME <=1 SENSITIVE Sensitive     CEFTRIAXONE <=0.25 SENSITIVE Sensitive     CIPROFLOXACIN <=0.25 SENSITIVE Sensitive     GENTAMICIN <=1 SENSITIVE Sensitive     IMIPENEM <=0.25 SENSITIVE Sensitive     TRIMETH/SULFA <=20 SENSITIVE Sensitive     AMPICILLIN/SULBACTAM >=32 RESISTANT Resistant     PIP/TAZO <=4 SENSITIVE Sensitive     * ESCHERICHIA COLI  Culture, blood (routine x 2)     Status: None   Collection Time: 01/05/21  8:01 PM   Specimen: BLOOD  Result Value Ref Range Status   Specimen Description BLOOD BLOOD LEFT FOREARM  Final   Special Requests   Final    BOTTLES DRAWN AEROBIC AND ANAEROBIC Blood Culture results may not be optimal due to an inadequate volume of blood received in culture bottles   Culture   Final    NO GROWTH 5 DAYS Performed at [facility], [address] [place], [place] [address]    Report Status 01/10/2021 FINAL  Final  Blood Culture ID Panel (Reflexed)     Status: Abnormal   Collection Time: 01/05/21  8:01 PM  Result Value Ref Range Status   Enterococcus faecalis NOT DETECTED NOT DETECTED Final   Enterococcus Faecium NOT DETECTED NOT DETECTED Final   Listeria monocytogenes NOT DETECTED NOT DETECTED Final  Staphylococcus species NOT DETECTED NOT DETECTED Final   Staphylococcus aureus (BCID) NOT DETECTED NOT DETECTED Final    Staphylococcus epidermidis NOT DETECTED NOT DETECTED Final   Staphylococcus lugdunensis NOT DETECTED NOT DETECTED Final   Streptococcus species NOT DETECTED NOT DETECTED Final   Streptococcus agalactiae NOT DETECTED NOT DETECTED Final   Streptococcus pneumoniae NOT DETECTED NOT DETECTED Final   Streptococcus pyogenes NOT DETECTED NOT DETECTED Final   A.calcoaceticus-baumannii NOT DETECTED NOT DETECTED Final   Bacteroides fragilis NOT DETECTED NOT DETECTED Final   Enterobacterales DETECTED (A) NOT DETECTED Final    Comment: Enterobacterales represent a large order of gram negative bacteria, not a single organism. CRITICAL RESULT CALLED TO, READ BACK BY AND VERIFIED WITH:  LISA KLUTTZ AT 1046 01/06/21 SDR    Enterobacter cloacae complex NOT DETECTED NOT DETECTED Final   Escherichia coli DETECTED (A) NOT DETECTED Final    Comment: CRITICAL RESULT CALLED TO, READ BACK BY AND VERIFIED WITH:  LISA KLUTTZ AT 1046 01/06/21 SDR    Klebsiella aerogenes NOT DETECTED NOT DETECTED Final   Klebsiella oxytoca NOT DETECTED NOT DETECTED Final   Klebsiella pneumoniae NOT DETECTED NOT DETECTED Final   Proteus species NOT DETECTED NOT DETECTED Final   Salmonella species NOT DETECTED NOT DETECTED Final   Serratia marcescens NOT DETECTED NOT DETECTED Final   Haemophilus influenzae NOT DETECTED NOT DETECTED Final   Neisseria meningitidis NOT DETECTED NOT DETECTED Final   Pseudomonas aeruginosa NOT DETECTED NOT DETECTED Final   Stenotrophomonas maltophilia NOT DETECTED NOT DETECTED Final   Candida albicans NOT DETECTED NOT DETECTED Final   Candida auris NOT DETECTED NOT DETECTED Final   Candida glabrata NOT DETECTED NOT DETECTED Final   Candida krusei NOT DETECTED NOT DETECTED Final   Candida parapsilosis NOT DETECTED NOT DETECTED Final   Candida tropicalis NOT DETECTED NOT DETECTED Final   Cryptococcus neoformans/gattii NOT DETECTED NOT DETECTED Final   CTX-M ESBL NOT DETECTED NOT DETECTED Final    Carbapenem resistance IMP NOT DETECTED NOT DETECTED Final   Carbapenem resistance KPC NOT DETECTED NOT DETECTED Final   Carbapenem resistance NDM NOT DETECTED NOT DETECTED Final   Carbapenem resist OXA 48 LIKE NOT DETECTED NOT DETECTED Final   Carbapenem resistance VIM NOT DETECTED NOT DETECTED Final    Comment: Performed at [facility], [address] [place], [place] [address]  Resp Panel by RT-PCR (Flu A&B, Covid) Nasopharyngeal Swab     Status: None   Collection Time: 01/05/21  8:10 PM   Specimen: Nasopharyngeal Swab; Nasopharyngeal(NP) swabs in vial transport medium  Result Value Ref Range Status   SARS Coronavirus 2 by RT PCR NEGATIVE NEGATIVE Final    Comment: (NOTE) SARS-CoV-2 target nucleic acids are NOT DETECTED.  The SARS-CoV-2 RNA is generally detectable in upper respiratory specimens during the acute phase of infection. The lowest concentration of SARS-CoV-2 viral copies this assay can detect is 138 copies/mL. A negative result does not preclude SARS-Cov-2 infection and should not be used as the sole basis for treatment or other patient management decisions. A negative result may occur with  improper specimen collection/handling, submission of specimen other than nasopharyngeal swab, presence of viral mutation(s) within the areas targeted by this assay, and inadequate number of viral copies(<138 copies/mL). A negative result must be combined with clinical observations, patient history, and epidemiological information. The expected result is Negative.  Fact Sheet for Patients:  BloggerCourse.com  Fact Sheet for Healthcare Providers:  SeriousBroker.it  This test is no t yet approved  or cleared by the [place] and  has been authorized for detection and/or diagnosis of SARS-CoV-2 by FDA under an Emergency Use Authorization (EUA). This EUA will remain  in effect (meaning this test can be used) for the  duration of the COVID-19 declaration under Section 564(b)(1) of the Act, 21 U.S.C.section 360bbb-3(b)(1), unless the authorization is terminated  or revoked sooner.       Influenza A by PCR NEGATIVE NEGATIVE Final   Influenza B by PCR NEGATIVE NEGATIVE Final    Comment: (NOTE) The Xpert Xpress SARS-CoV-2/FLU/RSV plus assay is intended as an aid in the diagnosis of influenza from Nasopharyngeal swab specimens and should not be used as a sole basis for treatment. Nasal washings and aspirates are unacceptable for Xpert Xpress SARS-CoV-2/FLU/RSV testing.  Fact Sheet for Patients: BloggerCourse.com  Fact Sheet for Healthcare Providers: SeriousBroker.it  This test is not yet approved or cleared by the [place] FDA and has been authorized for detection and/or diagnosis of SARS-CoV-2 by FDA under an Emergency Use Authorization (EUA). This EUA will remain in effect (meaning this test can be used) for the duration of the COVID-19 declaration under Section 564(b)(1) of the Act, 21 U.S.C. section 360bbb-3(b)(1), unless the authorization is terminated or revoked.  Performed at [facility], [address] [place], [place] [address]   Urine culture     Status: Abnormal   Collection Time: 01/05/21  9:10 PM   Specimen: In/Out Cath Urine  Result Value Ref Range Status   Specimen Description   Final    IN/OUT CATH URINE Performed at [facility], [address], [place], [place] [address]    Special Requests   Final    NONE Performed at [facility], [address] [place], [place] [address]    Culture MULTIPLE SPECIES PRESENT, SUGGEST RECOLLECTION (A)  Final   Report Status 01/07/2021 FINAL  Final  MRSA PCR Screening     Status: Abnormal   Collection Time: 01/06/21  5:00 AM   Specimen: Nasopharyngeal  Result Value Ref Range Status   MRSA by PCR POSITIVE (A) NEGATIVE Final    Comment:        The  GeneXpert MRSA Assay (FDA approved for NASAL specimens only), is one component of a comprehensive MRSA colonization surveillance program. It is not intended to diagnose MRSA infection nor to guide or monitor treatment for MRSA infections. RESULT CALLED TO, READ BACK BY AND VERIFIED WITH:  [facility] AT 0710 01/06/21 SDR Performed at [facility], [address], [place], [place] [address]          Radiology Studies: No results found.      Scheduled Meds: . chlordiazePOXIDE  25 mg Oral QID  . Chlorhexidine Gluconate Cloth  6 each Topical Daily  . folic acid  1 mg Oral Daily  . gabapentin  600 mg Oral BID  . heparin  5,000 Units Subcutaneous Q8H  . insulin aspart  0-20 Units Subcutaneous TID AC & HS  . insulin aspart  15 Units Subcutaneous TID WC  . insulin glargine  10 Units Subcutaneous QHS  . insulin glargine  45 Units Subcutaneous Daily  . insulin glargine  5 Units Subcutaneous Once  . losartan  50 mg Oral Daily  . multivitamin with minerals  1 tablet Oral Daily  . pantoprazole  40 mg Oral Daily  . Ensure Max Protein  11 oz Oral BID   Continuous Infusions: .  ceFAZolin (ANCEF) IV 2 g (  01/12/21 0534)     LOS: 7 days    Time spent: 25 minutes    Tresa Moore, MD [facility] Pager [phone]  If 7PM-7AM, please contact night-coverage 01/12/2021, 10:33 AM

## 2021-01-12 NOTE — Plan of Care (Signed)

## 2021-01-13 LAB — GLUCOSE, CAPILLARY
Glucose-Capillary: 356 mg/dL — ABNORMAL HIGH (ref 70–99)
Glucose-Capillary: 178 mg/dL — ABNORMAL HIGH (ref 70–99)
Glucose-Capillary: 356 mg/dL — ABNORMAL HIGH (ref 70–99)
Glucose-Capillary: >600 mg/dL (ref 70–99)
Glucose-Capillary: 551 mg/dL (ref 70–99)

## 2021-01-13 LAB — BASIC METABOLIC PANEL
Anion gap: 10 (ref 5–15)
BUN: 24 mg/dL — ABNORMAL HIGH (ref 6–20)
CO2: 24 mmol/L (ref 22–32)
Calcium: 8.9 mg/dL (ref 8.9–10.3)
Chloride: 91 mmol/L — ABNORMAL LOW (ref 98–111)
Creatinine, Ser: 1.33 mg/dL — ABNORMAL HIGH (ref 0.61–1.24)
GFR, Estimated: >60 mL/min (ref >60)
Glucose, Bld: 705 mg/dL (ref 70–99)
Potassium: 5.5 mmol/L — ABNORMAL HIGH (ref 3.5–5.1)
Sodium: 125 mmol/L — ABNORMAL LOW (ref 135–145)

## 2021-01-13 LAB — GLUCOSE, RANDOM: Glucose, Bld: 720 mg/dL (ref 70–99)

## 2021-01-13 MED ORDER — LORAZEPAM 2 MG/ML IJ SOLN
1.0000 mg | Freq: Once | INTRAMUSCULAR | Status: AC
  Administered 2021-01-13: 1 mg via INTRAVENOUS
  Filled 2021-01-13: qty 1

## 2021-01-13 MED ORDER — THIAMINE HCL 100 MG PO TABS
100.0000 mg | ORAL_TABLET | Freq: Every day | ORAL | Status: DC
  Administered 2021-01-13 – 2021-01-16 (×5): 100 mg via ORAL
  Filled 2021-01-13 (×4): qty 1

## 2021-01-13 MED ORDER — INSULIN ASPART 100 UNIT/ML ~~LOC~~ SOLN
18.0000 [IU] | Freq: Three times a day (TID) | SUBCUTANEOUS | Status: DC
  Administered 2021-01-13: 18 [IU] via SUBCUTANEOUS
  Filled 2021-01-13: qty 1

## 2021-01-13 MED ORDER — INSULIN ASPART 100 UNIT/ML ~~LOC~~ SOLN
40.0000 [IU] | Freq: Once | SUBCUTANEOUS | Status: AC
  Administered 2021-01-13: 40 [IU] via SUBCUTANEOUS
  Filled 2021-01-13: qty 1

## 2021-01-13 MED ORDER — TRAZODONE HCL 100 MG PO TABS
100.0000 mg | ORAL_TABLET | Freq: Every evening | ORAL | Status: DC | PRN
Start: 2021-01-13 — End: 2021-01-16
  Administered 2021-01-13 – 2021-01-15 (×3): 100 mg via ORAL
  Filled 2021-01-13 (×3): qty 1

## 2021-01-13 MED ORDER — SODIUM CHLORIDE 0.9 % IV BOLUS
1000.0000 mL | Freq: Once | INTRAVENOUS | Status: AC
  Administered 2021-01-13: 1000 mL via INTRAVENOUS

## 2021-01-13 MED ORDER — INSULIN GLARGINE 100 UNIT/ML ~~LOC~~ SOLN
20.0000 [IU] | Freq: Every day | SUBCUTANEOUS | Status: DC
  Administered 2021-01-13: 20 [IU] via SUBCUTANEOUS
  Filled 2021-01-13 (×2): qty 0.2

## 2021-01-13 MED ORDER — HYDROXYZINE HCL 25 MG PO TABS
25.0000 mg | ORAL_TABLET | Freq: Three times a day (TID) | ORAL | Status: DC | PRN
  Administered 2021-01-13 – 2021-01-15 (×4): 25 mg via ORAL
  Filled 2021-01-13 (×4): qty 1

## 2021-01-13 MED ORDER — SODIUM CHLORIDE 0.9 % IV SOLN: INTRAVENOUS | Status: DC

## 2021-01-13 NOTE — Progress Notes (Signed)
Manuela Schwartz NP notified of patients blood sugar 720, orders given

## 2021-01-13 NOTE — Progress Notes (Signed)
No ativan on profile to be given.  Patient states feels nervous and "about to go out of my mind".  He is pacing in the room with the sitter present.  He is being cooperative and is redirectable but states "he's going to lose it" if he doesn't get something to calm him down.  MD notified of patient request.  Haldol IM given. Sitter at bedside.

## 2021-01-13 NOTE — TOC Progression Note (Signed)
Transition of Care [facility]) - Progression Note    Patient Details  Name: Andrew Buchanan MRN: [id number] Date of Birth: [birth year]  Transition of Care [facility]) CM/SW Contact  Margarito Liner, LCSW Phone Number: 01/13/2021, 3:33 PM  Clinical Narrative:  CSW acknowledges SNF consult. PT eval pending. Will follow up once recommendations have been made.  Expected Discharge Plan: Home/Self Care Barriers to Discharge: Continued Medical Work up  Expected Discharge Plan and Services Expected Discharge Plan: Home/Self Care       Living arrangements for the past 2 months: Single Family Home                                       Social Determinants of Health (SDOH) Interventions    Readmission Risk Interventions Readmission Risk Prevention Plan 01/12/2021  Transportation Screening Complete  PCP or Specialist Appt within 3-5 Days Complete  Social Work Consult for Recovery Care Planning/Counseling Complete  Palliative Care Screening Not Applicable  Medication Review [employer]) Complete  Some recent data might be hidden

## 2021-01-13 NOTE — Progress Notes (Signed)
Manuela Schwartz NP notified blood sugar is now 551.

## 2021-01-13 NOTE — Progress Notes (Addendum)
PROGRESS NOTE    Andrew Buchanan  AJG:[id number] DOB: November 25, 1970 DOA: 01/05/2021 PCP: Patient, No Pcp Per (Inactive)  Brief Narrative:  50 y.o. male with medical history significant for type II DM, hypertension, pancreatitis, anxiety, alcohol use disorder, cocaine abuse, who was brought to the hospital because of altered mental status.  He was found to have hyperosmolar hyperglycemic state, severe sepsis and AKI.  He was admitted to the ICU for further management.  Additional work-up revealed E. coli bacteremia.  He was treated with IV insulin infusion, IV fluids and empiric IV antibiotics.  He was transferred to the hospitalist service on 01/08/2021.  Hospital course was complicated by acute toxic metabolic encephalopathy/delirium/alcohol withdrawal syndrome. Stay complicated by difficult to control blood sugars.   Assessment & Plan:   Principal Problem:   Sepsis with acute renal failure without septic shock (HCC) Active Problems:   Hyperosmolar hyperglycemic state (HHS) (HCC)   Malnutrition of moderate degree   AKI (acute kidney injury) (HCC)  Type 2 diabetes mellitus complicated by hyperosmolar hyperglycemic state Status post IV insulin, now titrated off Poor control over interval HbA1c greater than 15.5 indicating dismal control Plan: Increase Lantus, 45 units every morning, 20 units every afternoon NovoLog 18 units 3 times daily before meals and at bedtime Resistant sliding scale Carb modified diet Titrate glycemic regimen to achieve BG 140-180 Diabetes coordinator consult appreciated  Severe sepsis secondary to E. coli bacteremia Source of bacteremia is unclear Urine culture with multiple species Sepsis physiology improving  IV Ancef 7-day total course- started 4/3 (got rocephin prior)  Acute kidney injury In the setting of shock and encephalopathy Creatinine downtrending No IV fluids necessary Encourage p.o. fluid intake Monitor daily BMP  Alcohol use  disorder Acute alcohol withdrawal Acute toxic metabolic encephalopathy secondary to above Required Precedex drip now titrated off Continue Librium As needed Haldol for agitation -sitter for safety  Cocaine abuse Counseled patient   OT recommends SNF   DVT prophylaxis: SQ heparin Code Status: Full  Disposition Plan:Status is: Inpatient  Remains inpatient appropriate because:Inpatient level of care appropriate due to severity of illness   Dispo: The patient is from: Home              Anticipated d/c is to: Home vs SNF              Patient currently is not medically stable to d/c.   Difficult to place patient No   Withdrawal symptoms slowly improving.  Patient remains somewhat altered and tangential in thought.  Continue treatment with CIWA protocol.  Therapy consults recommending SNF. TOC consult placed for substance abuse counseling education.  Disposition hopefully within the next 2 days.     Consultants:   Critical care     Subjective: Asking for xanax and klonopin  Objective: Vitals:   01/13/21 0339 01/13/21 0500 01/13/21 0808 01/13/21 1131  BP: (!) 139/92  130/81 (!) 133/111  Pulse: 88  92 83  Resp: 18  20 18   Temp: 97.7 F (36.5 C)  97.9 F (36.6 C) 97.8 F (36.6 C)  TempSrc: Oral     SpO2: 98%  97% 100%  Weight:  87.6 kg    Height:        Intake/Output Summary (Last 24 hours) at 01/13/2021 1233 Last data filed at 01/13/2021 1012 Gross per 24 hour  Intake 540 ml  Output 1101 ml  Net -561 ml   Filed Weights   01/10/21 0350 01/12/21 0500 01/13/21 0500  Weight: 87.3  kg 87.5 kg 87.6 kg    Examination:  General: Appearance:     Overweight male in no acute distress     Lungs:     respirations unlabored  Heart:    Normal heart rate. Normal rhythm. No murmurs, rubs, or gallops.   MS:   All extremities are intact.   Neurologic:   Awake, alert, tangential in thoughts, difficult to re-direct     Data Reviewed: I have personally reviewed  following labs and imaging studies  CBC: Recent Labs  Lab 01/07/21 0500 01/08/21 0521 01/09/21 0317 01/10/21 0332  WBC 16.9* 17.7* 12.3* 12.5*  NEUTROABS  --  13.8* 9.0* 8.7*  HGB 12.4* 13.1 11.3* 10.9*  HCT 37.1* 38.8* 34.9* 34.0*  MCV 104.8* 103.5* 106.7* 109.3*  PLT 240 286 216 156   Basic Metabolic Panel: Recent Labs  Lab 01/07/21 0500 01/07/21 1145 01/08/21 0521 01/08/21 2028 01/09/21 0317 01/10/21 0332 01/11/21 0350 01/12/21 0324  NA 148*   < > 140  --  146* 139 138 135  K 3.7   < > 3.5  --  3.5 4.2 4.0 4.2  CL 110   < > 102  --  110 105 105 101  CO2 27   < > 29  --  28 25 25 25   GLUCOSE 233*   < > 150* 303* 155* 320* 211* 280*  BUN 53*   < > 37*  --  21* 22* 18 21*  CREATININE 1.84*   < > 1.55*  --  1.32* 1.18 1.08 1.11  CALCIUM 8.8*   < > 8.3*  --  8.4* 7.9* 8.4* 8.5*  MG 2.4  --  2.0  --  2.0 1.8 1.9  --   PHOS 3.4  --  2.2*  --  2.2* 2.7 2.6  --    < > = values in this interval not displayed.   GFR: Estimated Creatinine Clearance: 88.4 mL/min (by C-G formula based on SCr of 1.11 mg/dL). Liver Function Tests: Recent Labs  Lab 01/07/21 0500  AST 43*  ALT 15  ALKPHOS 70  BILITOT 0.7  PROT 6.7  ALBUMIN 2.5*   No results for input(s): LIPASE, AMYLASE in the last 168 hours. No results for input(s): AMMONIA in the last 168 hours. Coagulation Profile: No results for input(s): INR, PROTIME in the last 168 hours. Cardiac Enzymes: No results for input(s): CKTOTAL, CKMB, CKMBINDEX, TROPONINI in the last 168 hours. BNP (last 3 results) No results for input(s): PROBNP in the last 8760 hours. HbA1C: No results for input(s): HGBA1C in the last 72 hours. CBG: Recent Labs  Lab 01/12/21 1132 01/12/21 1556 01/12/21 2125 01/13/21 0729 01/13/21 1129  GLUCAP 179* 258* 360* 356* 178*   Lipid Profile: No results for input(s): CHOL, HDL, LDLCALC, TRIG, CHOLHDL, LDLDIRECT in the last 72 hours. Thyroid Function Tests: No results for input(s): TSH, T4TOTAL,  FREET4, T3FREE, THYROIDAB in the last 72 hours. Anemia Panel: No results for input(s): VITAMINB12, FOLATE, FERRITIN, TIBC, IRON, RETICCTPCT in the last 72 hours. Sepsis Labs: Recent Labs  Lab 01/07/21 0500 01/08/21 0521  PROCALCITON 0.52 0.29    Recent Results (from the past 240 hour(s))  Culture, blood (routine x 2)     Status: Abnormal   Collection Time: 01/05/21  8:01 PM   Specimen: BLOOD  Result Value Ref Range Status   Specimen Description   Final    BLOOD BLOOD RIGHT FOREARM Performed at [facility], [address] [place], [place]  Special Requests   Final    BOTTLES DRAWN AEROBIC AND ANAEROBIC Blood Culture results may not be optimal due to an excessive volume of blood received in culture bottles Performed at [facility], [address] [place], [place] [address]    Culture  Setup Time   Final    Organism ID to follow GRAM NEGATIVE RODS IN BOTH AEROBIC AND ANAEROBIC BOTTLES CRITICAL RESULT CALLED TO, READ BACK BY AND VERIFIED WITH: LISA KLUTTZ AT 1046 01/06/21 SDR Performed at [facility], [address] [place], [place] [address]    Culture ESCHERICHIA COLI (A)  Final   Report Status 01/08/2021 FINAL  Final   Organism ID, Bacteria ESCHERICHIA COLI  Final      Susceptibility   Escherichia coli - MIC*    AMPICILLIN >=32 RESISTANT Resistant     CEFAZOLIN <=4 SENSITIVE Sensitive     CEFEPIME <=0.12 SENSITIVE Sensitive     CEFTAZIDIME <=1 SENSITIVE Sensitive     CEFTRIAXONE <=0.25 SENSITIVE Sensitive     CIPROFLOXACIN <=0.25 SENSITIVE Sensitive     GENTAMICIN <=1 SENSITIVE Sensitive     IMIPENEM <=0.25 SENSITIVE Sensitive     TRIMETH/SULFA <=20 SENSITIVE Sensitive     AMPICILLIN/SULBACTAM >=32 RESISTANT Resistant     PIP/TAZO <=4 SENSITIVE Sensitive     * ESCHERICHIA COLI  Culture, blood (routine x 2)     Status: None   Collection Time: 01/05/21  8:01 PM   Specimen: BLOOD  Result Value Ref Range Status   Specimen  Description BLOOD BLOOD LEFT FOREARM  Final   Special Requests   Final    BOTTLES DRAWN AEROBIC AND ANAEROBIC Blood Culture results may not be optimal due to an inadequate volume of blood received in culture bottles   Culture   Final    NO GROWTH 5 DAYS Performed at [facility], [address] [place], [place] [address]    Report Status 01/10/2021 FINAL  Final  Blood Culture ID Panel (Reflexed)     Status: Abnormal   Collection Time: 01/05/21  8:01 PM  Result Value Ref Range Status   Enterococcus faecalis NOT DETECTED NOT DETECTED Final   Enterococcus Faecium NOT DETECTED NOT DETECTED Final   Listeria monocytogenes NOT DETECTED NOT DETECTED Final   Staphylococcus species NOT DETECTED NOT DETECTED Final   Staphylococcus aureus (BCID) NOT DETECTED NOT DETECTED Final   Staphylococcus epidermidis NOT DETECTED NOT DETECTED Final   Staphylococcus lugdunensis NOT DETECTED NOT DETECTED Final   Streptococcus species NOT DETECTED NOT DETECTED Final   Streptococcus agalactiae NOT DETECTED NOT DETECTED Final   Streptococcus pneumoniae NOT DETECTED NOT DETECTED Final   Streptococcus pyogenes NOT DETECTED NOT DETECTED Final   A.calcoaceticus-baumannii NOT DETECTED NOT DETECTED Final   Bacteroides fragilis NOT DETECTED NOT DETECTED Final   Enterobacterales DETECTED (A) NOT DETECTED Final    Comment: Enterobacterales represent a large order of gram negative bacteria, not a single organism. CRITICAL RESULT CALLED TO, READ BACK BY AND VERIFIED WITH:  LISA KLUTTZ AT 1046 01/06/21 SDR    Enterobacter cloacae complex NOT DETECTED NOT DETECTED Final   Escherichia coli DETECTED (A) NOT DETECTED Final    Comment: CRITICAL RESULT CALLED TO, READ BACK BY AND VERIFIED WITH:  LISA KLUTTZ AT 1046 01/06/21 SDR    Klebsiella aerogenes NOT DETECTED NOT DETECTED Final   Klebsiella oxytoca NOT DETECTED NOT DETECTED Final   Klebsiella pneumoniae NOT DETECTED NOT DETECTED Final   Proteus species NOT  DETECTED NOT  DETECTED Final   Salmonella species NOT DETECTED NOT DETECTED Final   Serratia marcescens NOT DETECTED NOT DETECTED Final   Haemophilus influenzae NOT DETECTED NOT DETECTED Final   Neisseria meningitidis NOT DETECTED NOT DETECTED Final   Pseudomonas aeruginosa NOT DETECTED NOT DETECTED Final   Stenotrophomonas maltophilia NOT DETECTED NOT DETECTED Final   Candida albicans NOT DETECTED NOT DETECTED Final   Candida auris NOT DETECTED NOT DETECTED Final   Candida glabrata NOT DETECTED NOT DETECTED Final   Candida krusei NOT DETECTED NOT DETECTED Final   Candida parapsilosis NOT DETECTED NOT DETECTED Final   Candida tropicalis NOT DETECTED NOT DETECTED Final   Cryptococcus neoformans/gattii NOT DETECTED NOT DETECTED Final   CTX-M ESBL NOT DETECTED NOT DETECTED Final   Carbapenem resistance IMP NOT DETECTED NOT DETECTED Final   Carbapenem resistance KPC NOT DETECTED NOT DETECTED Final   Carbapenem resistance NDM NOT DETECTED NOT DETECTED Final   Carbapenem resist OXA 48 LIKE NOT DETECTED NOT DETECTED Final   Carbapenem resistance VIM NOT DETECTED NOT DETECTED Final    Comment: Performed at [facility], [address] [place], [place] [address]  Resp Panel by RT-PCR (Flu A&B, Covid) Nasopharyngeal Swab     Status: None   Collection Time: 01/05/21  8:10 PM   Specimen: Nasopharyngeal Swab; Nasopharyngeal(NP) swabs in vial transport medium  Result Value Ref Range Status   SARS Coronavirus 2 by RT PCR NEGATIVE NEGATIVE Final    Comment: (NOTE) SARS-CoV-2 target nucleic acids are NOT DETECTED.  The SARS-CoV-2 RNA is generally detectable in upper respiratory specimens during the acute phase of infection. The lowest concentration of SARS-CoV-2 viral copies this assay can detect is 138 copies/mL. A negative result does not preclude SARS-Cov-2 infection and should not be used as the sole basis for treatment or other patient management decisions. A negative result may occur  with  improper specimen collection/handling, submission of specimen other than nasopharyngeal swab, presence of viral mutation(s) within the areas targeted by this assay, and inadequate number of viral copies(<138 copies/mL). A negative result must be combined with clinical observations, patient history, and epidemiological information. The expected result is Negative.  Fact Sheet for Patients:  BloggerCourse.com  Fact Sheet for Healthcare Providers:  SeriousBroker.it  This test is no t yet approved or cleared by the [place] FDA and  has been authorized for detection and/or diagnosis of SARS-CoV-2 by FDA under an Emergency Use Authorization (EUA). This EUA will remain  in effect (meaning this test can be used) for the duration of the COVID-19 declaration under Section 564(b)(1) of the Act, 21 U.S.C.section 360bbb-3(b)(1), unless the authorization is terminated  or revoked sooner.       Influenza A by PCR NEGATIVE NEGATIVE Final   Influenza B by PCR NEGATIVE NEGATIVE Final    Comment: (NOTE) The Xpert Xpress SARS-CoV-2/FLU/RSV plus assay is intended as an aid in the diagnosis of influenza from Nasopharyngeal swab specimens and should not be used as a sole basis for treatment. Nasal washings and aspirates are unacceptable for Xpert Xpress SARS-CoV-2/FLU/RSV testing.  Fact Sheet for Patients: BloggerCourse.com  Fact Sheet for Healthcare Providers: SeriousBroker.it  This test is not yet approved or cleared by the [place] FDA and has been authorized for detection and/or diagnosis of SARS-CoV-2 by FDA under an Emergency Use Authorization (EUA). This EUA will remain in effect (meaning this test can be used) for the duration of the COVID-19 declaration under Section 564(b)(1) of the Act, 21 U.S.C. section 360bbb-3(b)(1),  unless the authorization is terminated  or revoked.  Performed at [facility], [address] [place], [place] [address]   Urine culture     Status: Abnormal   Collection Time: 01/05/21  9:10 PM   Specimen: In/Out Cath Urine  Result Value Ref Range Status   Specimen Description   Final    IN/OUT CATH URINE Performed at [facility], [address] [place], [place] [address]    Special Requests   Final    NONE Performed at [facility], [address] [place], [place] [address]    Culture MULTIPLE SPECIES PRESENT, SUGGEST RECOLLECTION (A)  Final   Report Status 01/07/2021 FINAL  Final  MRSA PCR Screening     Status: Abnormal   Collection Time: 01/06/21  5:00 AM   Specimen: Nasopharyngeal  Result Value Ref Range Status   MRSA by PCR POSITIVE (A) NEGATIVE Final    Comment:        The GeneXpert MRSA Assay (FDA approved for NASAL specimens only), is one component of a comprehensive MRSA colonization surveillance program. It is not intended to diagnose MRSA infection nor to guide or monitor treatment for MRSA infections. RESULT CALLED TO, READ BACK BY AND VERIFIED WITH:  [facility] AT 0710 01/06/21 SDR Performed at [facility], [address] [place], [place] [address]          Radiology Studies: No results found.      Scheduled Meds: . chlordiazePOXIDE  25 mg Oral QID  . Chlorhexidine Gluconate Cloth  6 each Topical Daily  . folic acid  1 mg Oral Daily  . gabapentin  600 mg Oral BID  . heparin  5,000 Units Subcutaneous Q8H  . insulin aspart  0-20 Units Subcutaneous TID AC & HS  . insulin aspart  18 Units Subcutaneous TID WC  . insulin glargine  20 Units Subcutaneous QHS  . insulin glargine  45 Units Subcutaneous Daily  . losartan  50 mg Oral Daily  . multivitamin with minerals  1 tablet Oral Daily  . pantoprazole  40 mg Oral Daily  . Ensure Max Protein  11 oz Oral BID   Continuous Infusions: .  ceFAZolin (ANCEF) IV 2 g (01/13/21 [id number])     LOS: 8  days    Time spent: 25 minutes    Joseph ArtJessica U [employer], DO [facility]   If 7PM-7AM, please contact night-coverage 01/13/2021, 12:33 PM

## 2021-01-13 NOTE — Progress Notes (Signed)
PT Cancellation Note  Patient Details Name: Andrew Buchanan MRN: [id number] DOB: 1971-02-03   Cancelled Treatment:    Reason Eval/Treat Not Completed: Other (comment).  PT consult received.  Chart reviewed.  Pt sleeping in bed upon PT arrival; sitter present (reports pt has been walking with assist to bathroom but is unsteady).  Pt woken with vc's but declined PT session d/t receiving Haldol today and did not feel he could participate d/t this.  Pt then agreeable to trying to walk but kept falling back asleep.  Nurse notified.  Will re-attempt PT evaluation at a later date/time.  Hendricks Limes, PT 01/13/21, 2:55 PM

## 2021-01-13 NOTE — Progress Notes (Signed)
Inpatient Diabetes Program Recommendations  AACE/ADA: New Consensus Statement on Inpatient Glycemic Control (2015)  Target Ranges:  Prepandial:   less than 140 mg/dL      Peak postprandial:   less than 180 mg/dL (1-2 hours)      Critically ill patients:  140 - 180 mg/dL   Results for CORTLAN, DOLIN (MRN [id number]) as of 01/13/2021 11:38  Ref. Range 01/12/2021 07:27 01/12/2021 11:32 01/12/2021 15:56 01/12/2021 21:25  Glucose-Capillary Latest Ref Range: 70 - 99 mg/dL 332 (H)  15 units NOVOLOG @9am  179 (H)  19 units NOVOLOG @1 :21pm 258 (H)  26 units NOVOLOG  360 (H)  20 units NOVOLOG  10 units LANTUS   Results for BRITTAIN, HOSIE (MRN ) as of 01/13/2021 11:38  Ref. Range 01/13/2021 07:29 01/13/2021 11:29  Glucose-Capillary Latest Ref Range: 70 - 99 mg/dL 03/15/2021 (H)  35 units NOVOLOG  45 units LANTUS 178 (H)     Home DM Meds: Novolog 6 units TID                             Lantus 35 units Daily                             Metformin 1000 mg BID  Current Orders: Lantus 45 units Daily      Lantus 10 units QHS      Novolog Resistant Correction Scale/ SSI (0-20 units) TID AC + HS      Novolog 15 units TID with meals    MD- Note 10 units Lantus added to bedtime dosing last PM.  Pt requiring large amounts of Insulin here in the hospital--CBG was still elevated this AM to 356 mg/dl  Please consider:  1. Increase PM dose of Lantus to 20 units QHS  2. Increase Novolog Meal Coverage to 18 units TID with meals    --Will follow patient during hospitalization--  03/15/2021 RN, MSN, CDE Diabetes Coordinator Inpatient Glycemic Control Team Team Pager: [phone] (8a-5p)

## 2021-01-14 LAB — CBC
HCT: 30.9 % — ABNORMAL LOW (ref 39.0–52.0)
Hemoglobin: 10.7 g/dL — ABNORMAL LOW (ref 13.0–17.0)
MCH: 34.9 pg — ABNORMAL HIGH (ref 26.0–34.0)
MCHC: 34.6 g/dL (ref 30.0–36.0)
MCV: 100.7 fL — ABNORMAL HIGH (ref 80.0–100.0)
Platelets: 182 10*3/uL (ref 150–400)
RBC: 3.07 MIL/uL — ABNORMAL LOW (ref 4.22–5.81)
RDW: 12.8 % (ref 11.5–15.5)
WBC: 13.1 10*3/uL — ABNORMAL HIGH (ref 4.0–10.5)
nRBC: 0 % (ref 0.0–0.2)

## 2021-01-14 LAB — GLUCOSE, CAPILLARY
Glucose-Capillary: 193 mg/dL — ABNORMAL HIGH (ref 70–99)
Glucose-Capillary: 322 mg/dL — ABNORMAL HIGH (ref 70–99)
Glucose-Capillary: 283 mg/dL — ABNORMAL HIGH (ref 70–99)
Glucose-Capillary: 222 mg/dL — ABNORMAL HIGH (ref 70–99)
Glucose-Capillary: 92 mg/dL (ref 70–99)
Glucose-Capillary: 203 mg/dL — ABNORMAL HIGH (ref 70–99)

## 2021-01-14 LAB — BASIC METABOLIC PANEL
Anion gap: 8 (ref 5–15)
BUN: 24 mg/dL — ABNORMAL HIGH (ref 6–20)
CO2: 24 mmol/L (ref 22–32)
Calcium: 8.8 mg/dL — ABNORMAL LOW (ref 8.9–10.3)
Chloride: 104 mmol/L (ref 98–111)
Creatinine, Ser: 1.05 mg/dL (ref 0.61–1.24)
GFR, Estimated: >60 mL/min (ref >60)
Glucose, Bld: 236 mg/dL — ABNORMAL HIGH (ref 70–99)
Potassium: 4.1 mmol/L (ref 3.5–5.1)
Sodium: 136 mmol/L (ref 135–145)

## 2021-01-14 MED ORDER — INSULIN GLARGINE 100 UNIT/ML ~~LOC~~ SOLN
50.0000 [IU] | Freq: Every day | SUBCUTANEOUS | Status: DC
  Administered 2021-01-14 – 2021-01-16 (×3): 50 [IU] via SUBCUTANEOUS
  Filled 2021-01-14 (×3): qty 0.5

## 2021-01-14 MED ORDER — INSULIN ASPART 100 UNIT/ML ~~LOC~~ SOLN: 0.0000 [IU] | Freq: Three times a day (TID) | SUBCUTANEOUS | Status: DC

## 2021-01-14 MED ORDER — INSULIN ASPART 100 UNIT/ML ~~LOC~~ SOLN
0.0000 [IU] | SUBCUTANEOUS | Status: DC
  Administered 2021-01-14: 4 [IU] via SUBCUTANEOUS
  Filled 2021-01-14: qty 1

## 2021-01-14 MED ORDER — ALPRAZOLAM 0.5 MG PO TABS
0.2500 mg | ORAL_TABLET | Freq: Once | ORAL | Status: AC
  Administered 2021-01-14: 0.25 mg via ORAL
  Filled 2021-01-14: qty 1

## 2021-01-14 MED ORDER — INSULIN GLARGINE 100 UNIT/ML ~~LOC~~ SOLN
25.0000 [IU] | Freq: Every day | SUBCUTANEOUS | Status: DC
  Administered 2021-01-14 – 2021-01-15 (×2): 25 [IU] via SUBCUTANEOUS
  Filled 2021-01-14 (×3): qty 0.25

## 2021-01-14 MED ORDER — INSULIN ASPART 100 UNIT/ML ~~LOC~~ SOLN
0.0000 [IU] | Freq: Every day | SUBCUTANEOUS | Status: DC
  Administered 2021-01-14: 2 [IU] via SUBCUTANEOUS
  Administered 2021-01-15: 4 [IU] via SUBCUTANEOUS
  Filled 2021-01-14 (×2): qty 1

## 2021-01-14 MED ORDER — INSULIN ASPART 100 UNIT/ML ~~LOC~~ SOLN
0.0000 [IU] | Freq: Three times a day (TID) | SUBCUTANEOUS | Status: DC
  Administered 2021-01-14: 7 [IU] via SUBCUTANEOUS
  Administered 2021-01-14: 15 [IU] via SUBCUTANEOUS
  Administered 2021-01-15: 20 [IU] via SUBCUTANEOUS
  Administered 2021-01-15: 4 [IU] via SUBCUTANEOUS
  Administered 2021-01-15: 7 [IU] via SUBCUTANEOUS
  Administered 2021-01-16: 4 [IU] via SUBCUTANEOUS
  Administered 2021-01-16: 11 [IU] via SUBCUTANEOUS
  Filled 2021-01-14 (×7): qty 1

## 2021-01-14 MED ORDER — HALOPERIDOL LACTATE 5 MG/ML IJ SOLN
2.0000 mg | Freq: Four times a day (QID) | INTRAMUSCULAR | Status: DC | PRN
  Administered 2021-01-14 – 2021-01-15 (×3): 2 mg via INTRAMUSCULAR
  Filled 2021-01-14 (×3): qty 1

## 2021-01-14 MED ORDER — INSULIN ASPART 100 UNIT/ML ~~LOC~~ SOLN: 22.0000 [IU] | Freq: Three times a day (TID) | SUBCUTANEOUS | Status: DC

## 2021-01-14 MED ORDER — LORAZEPAM 2 MG/ML IJ SOLN
2.0000 mg | Freq: Once | INTRAMUSCULAR | Status: AC
  Administered 2021-01-14: 2 mg via INTRAVENOUS
  Filled 2021-01-14: qty 1

## 2021-01-14 MED ORDER — INSULIN GLARGINE 100 UNIT/ML ~~LOC~~ SOLN
30.0000 [IU] | Freq: Every day | SUBCUTANEOUS | Status: DC
  Filled 2021-01-14: qty 0.3

## 2021-01-14 MED ORDER — INSULIN ASPART 100 UNIT/ML ~~LOC~~ SOLN
22.0000 [IU] | Freq: Three times a day (TID) | SUBCUTANEOUS | Status: DC
  Administered 2021-01-14 – 2021-01-15 (×4): 22 [IU] via SUBCUTANEOUS
  Filled 2021-01-14 (×4): qty 1
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
## 2021-01-14 NOTE — Progress Notes (Signed)
Manuela Schwartz notified blood sugar was 193

## 2021-01-14 NOTE — Progress Notes (Addendum)
PROGRESS NOTE    Andrew Buchanan  NWG:[id number] DOB: December 02, 1970 DOA: 01/05/2021 PCP: Patient, No Pcp Per (Inactive)  Brief Narrative:  50 y.o. male with medical history significant for type II DM, hypertension, pancreatitis, anxiety, alcohol use disorder, cocaine abuse, who was brought to the hospital because of altered mental status.  He was found to have hyperosmolar hyperglycemic state, severe sepsis and AKI.  He was admitted to the ICU for further management.  Additional work-up revealed E. coli bacteremia.  He was treated with IV insulin infusion, IV fluids and empiric IV antibiotics.  He was transferred to the hospitalist service on 01/08/2021.  Hospital course was complicated by acute toxic metabolic encephalopathy/delirium/alcohol withdrawal syndrome. Stay complicated by difficult to control blood sugars due to patient non-compliance.     Assessment & Plan:   Principal Problem:   Sepsis with acute renal failure without septic shock (HCC) Active Problems:   Hyperosmolar hyperglycemic state (HHS) (HCC)   Malnutrition of moderate degree   AKI (acute kidney injury) (HCC)    Type 2 diabetes mellitus complicated by hyperosmolar hyperglycemic state Status post IV insulin, now titrated off Poor control over interval HbA1c greater than 15.5 indicating dismal control -patient had sweet tea yesterday Increase Lantus, 45 units every morning, 20 units every afternoon NovoLog 18 units 3 times daily before meals and at bedtime Resistant sliding scale Carb modified diet Titrate glycemic regimen to achieve BG 140-180 Diabetes coordinator consult appreciated  Severe sepsis secondary to E. coli bacteremia Source of bacteremia is unclear Urine culture with multiple species Sepsis physiology improving  IV Ancef 7-day total course- started 4/3 (got rocephin prior)  Acute kidney injury In the setting of shock and encephalopathy Creatinine downtrending No IV fluids  necessary Encourage p.o. fluid intake Monitor daily BMP  Alcohol use disorder Acute alcohol withdrawal Acute toxic metabolic encephalopathy secondary to above Required Precedex drip now titrated off Continue Librium As needed Haldol for agitation -sitter for safety  Cocaine abuse Counseled patient   OT recommends SNF but patient prefers to go home   DVT prophylaxis: SQ heparin Code Status: Full Disposition Plan:Status is: Inpatient  Remains inpatient appropriate because:Inpatient level of care appropriate due to severity of illness   Dispo: The patient is from: Home              Anticipated d/c is to: Home vs SNF              Patient currently is not medically stable to d/c.   Difficult to place patient No Spoke with mother-- royce ann  Withdrawal symptoms slowly improving.  Patient remains somewhat altered and tangential in thought.  Continue treatment with CIWA protocol.  Therapy consults recommending SNF. TOC consult placed for substance abuse counseling education.  Disposition hopefully within the next 2 days.     Consultants:   Critical care     Subjective: Asking for xanax and klonopin  Objective: Vitals:   01/13/21 1951 01/13/21 2322 01/14/21 0359 01/14/21 0840  BP: (!) 144/81 133/85  116/69  Pulse: 86 100  86  Resp: Temp: (!) 97.3 F (36.3 C) 98.4 F (36.9 C)  98.2 F (36.8 C)  TempSrc: Oral Oral  Oral  SpO2: 99% 96%  96%  Weight:   89.8 kg   Height:        Intake/Output Summary (Last 24 hours) at 01/14/2021 1020 Last data filed at 01/14/2021 0957 Gross per 24 hour  Intake 2285.22 ml  Output --  Net 2285.22 ml   Filed Weights   01/12/21 0500 01/13/21 0500 01/14/21 0359  Weight: 87.5 kg 87.6 kg 89.8 kg    Examination:  General: Appearance:     Overweight male in no acute distress     Lungs:     respirations unlabored  Heart:    Normal heart rate. Normal rhythm. No murmurs, rubs, or gallops.   MS:   All extremities are  intact.   Neurologic:   Awake, alert       Data Reviewed: I have personally reviewed following labs and imaging studies  CBC: Recent Labs  Lab 01/08/21 0521 01/09/21 0317 01/10/21 0332 01/14/21 0546  WBC 17.7* 12.3* 12.5* 13.1*  NEUTROABS 13.8* 9.0* 8.7*  --   HGB 13.1 11.3* 10.9* 10.7*  HCT 38.8* 34.9* 34.0* 30.9*  MCV 103.5* 106.7* 109.3* 100.7*  PLT 286 216 156 182   Basic Metabolic Panel: Recent Labs  Lab 01/08/21 0521 01/08/21 2028 01/09/21 0317 01/10/21 0332 01/11/21 0350 01/12/21 0324 01/13/21 2126 01/14/21 0546  NA 140  --  146* 139 138 135 125* 136  K 3.5  --  3.5 4.2 4.0 4.2 5.5* 4.1  CL 102  --  110 105 105 101 91* 104  CO2 29  --  28 25 25 25 24 24   GLUCOSE 150*   < > 155* 320* 211* 280* 705*  720* 236*  BUN 37*  --  21* 22* 18 21* 24* 24*  CREATININE 1.55*  --  1.32* 1.18 1.08 1.11 1.33* 1.05  CALCIUM 8.3*  --  8.4* 7.9* 8.4* 8.5* 8.9 8.8*  MG 2.0  --  2.0 1.8 1.9  --   --   --   PHOS 2.2*  --  2.2* 2.7 2.6  --   --   --    < > = values in this interval not displayed.   GFR: Estimated Creatinine Clearance: 93.4 mL/min (by C-G formula based on SCr of 1.05 mg/dL). Liver Function Tests: No results for input(s): AST, ALT, ALKPHOS, BILITOT, PROT, ALBUMIN in the last 168 hours. No results for input(s): LIPASE, AMYLASE in the last 168 hours. No results for input(s): AMMONIA in the last 168 hours. Coagulation Profile: No results for input(s): INR, PROTIME in the last 168 hours. Cardiac Enzymes: No results for input(s): CKTOTAL, CKMB, CKMBINDEX, TROPONINI in the last 168 hours. BNP (last 3 results) No results for input(s): PROBNP in the last 8760 hours. HbA1C: No results for input(s): HGBA1C in the last 72 hours. CBG: Recent Labs  Lab 01/13/21 2051 01/13/21 2058 01/13/21 2326 01/14/21 0201 01/14/21 0745  GLUCAP >600* >600* 551* 193* 322*   Lipid Profile: No results for input(s): CHOL, HDL, LDLCALC, TRIG, CHOLHDL, LDLDIRECT in the last 72  hours. Thyroid Function Tests: No results for input(s): TSH, T4TOTAL, FREET4, T3FREE, THYROIDAB in the last 72 hours. Anemia Panel: No results for input(s): VITAMINB12, FOLATE, FERRITIN, TIBC, IRON, RETICCTPCT in the last 72 hours. Sepsis Labs: Recent Labs  Lab 01/08/21 0521  PROCALCITON 0.29    Recent Results (from the past 240 hour(s))  Culture, blood (routine x 2)     Status: Abnormal   Collection Time: 01/05/21  8:01 PM   Specimen: BLOOD  Result Value Ref Range Status   Specimen Description   Final    BLOOD BLOOD RIGHT FOREARM Performed at [facility], [address], [place], [place]    Special Requests   Final    BOTTLES DRAWN AEROBIC AND  ANAEROBIC Blood Culture results may not be optimal due to an excessive volume of blood received in culture bottles Performed at [facility], [address] [place], [place] [address]    Culture  Setup Time   Final    Organism ID to follow GRAM NEGATIVE RODS IN BOTH AEROBIC AND ANAEROBIC BOTTLES CRITICAL RESULT CALLED TO, READ BACK BY AND VERIFIED WITH: LISA KLUTTZ AT 1046 01/06/21 SDR Performed at [facility], [address] [place], [place] [address]    Culture ESCHERICHIA COLI (A)  Final   Report Status 01/08/2021 FINAL  Final   Organism ID, Bacteria ESCHERICHIA COLI  Final      Susceptibility   Escherichia coli - MIC*    AMPICILLIN >=32 RESISTANT Resistant     CEFAZOLIN <=4 SENSITIVE Sensitive     CEFEPIME <=0.12 SENSITIVE Sensitive     CEFTAZIDIME <=1 SENSITIVE Sensitive     CEFTRIAXONE <=0.25 SENSITIVE Sensitive     CIPROFLOXACIN <=0.25 SENSITIVE Sensitive     GENTAMICIN <=1 SENSITIVE Sensitive     IMIPENEM <=0.25 SENSITIVE Sensitive     TRIMETH/SULFA <=20 SENSITIVE Sensitive     AMPICILLIN/SULBACTAM >=32 RESISTANT Resistant     PIP/TAZO <=4 SENSITIVE Sensitive     * ESCHERICHIA COLI  Culture, blood (routine x 2)     Status: None   Collection Time: 01/05/21  8:01 PM   Specimen:  BLOOD  Result Value Ref Range Status   Specimen Description BLOOD BLOOD LEFT FOREARM  Final   Special Requests   Final    BOTTLES DRAWN AEROBIC AND ANAEROBIC Blood Culture results may not be optimal due to an inadequate volume of blood received in culture bottles   Culture   Final    NO GROWTH 5 DAYS Performed at [facility], [address] [place], [place] [address]    Report Status 01/10/2021 FINAL  Final  Blood Culture ID Panel (Reflexed)     Status: Abnormal   Collection Time: 01/05/21  8:01 PM  Result Value Ref Range Status   Enterococcus faecalis NOT DETECTED NOT DETECTED Final   Enterococcus Faecium NOT DETECTED NOT DETECTED Final   Listeria monocytogenes NOT DETECTED NOT DETECTED Final   Staphylococcus species NOT DETECTED NOT DETECTED Final   Staphylococcus aureus (BCID) NOT DETECTED NOT DETECTED Final   Staphylococcus epidermidis NOT DETECTED NOT DETECTED Final   Staphylococcus lugdunensis NOT DETECTED NOT DETECTED Final   Streptococcus species NOT DETECTED NOT DETECTED Final   Streptococcus agalactiae NOT DETECTED NOT DETECTED Final   Streptococcus pneumoniae NOT DETECTED NOT DETECTED Final   Streptococcus pyogenes NOT DETECTED NOT DETECTED Final   A.calcoaceticus-baumannii NOT DETECTED NOT DETECTED Final   Bacteroides fragilis NOT DETECTED NOT DETECTED Final   Enterobacterales DETECTED (A) NOT DETECTED Final    Comment: Enterobacterales represent a large order of gram negative bacteria, not a single organism. CRITICAL RESULT CALLED TO, READ BACK BY AND VERIFIED WITH:  LISA KLUTTZ AT 1046 01/06/21 SDR    Enterobacter cloacae complex NOT DETECTED NOT DETECTED Final   Escherichia coli DETECTED (A) NOT DETECTED Final    Comment: CRITICAL RESULT CALLED TO, READ BACK BY AND VERIFIED WITH:  LISA KLUTTZ AT 1046 01/06/21 SDR    Klebsiella aerogenes NOT DETECTED NOT DETECTED Final   Klebsiella oxytoca NOT DETECTED NOT DETECTED Final   Klebsiella pneumoniae NOT DETECTED  NOT DETECTED Final   Proteus species NOT DETECTED NOT DETECTED Final   Salmonella species NOT DETECTED NOT DETECTED Final  Serratia marcescens NOT DETECTED NOT DETECTED Final   Haemophilus influenzae NOT DETECTED NOT DETECTED Final   Neisseria meningitidis NOT DETECTED NOT DETECTED Final   Pseudomonas aeruginosa NOT DETECTED NOT DETECTED Final   Stenotrophomonas maltophilia NOT DETECTED NOT DETECTED Final   Candida albicans NOT DETECTED NOT DETECTED Final   Candida auris NOT DETECTED NOT DETECTED Final   Candida glabrata NOT DETECTED NOT DETECTED Final   Candida krusei NOT DETECTED NOT DETECTED Final   Candida parapsilosis NOT DETECTED NOT DETECTED Final   Candida tropicalis NOT DETECTED NOT DETECTED Final   Cryptococcus neoformans/gattii NOT DETECTED NOT DETECTED Final   CTX-M ESBL NOT DETECTED NOT DETECTED Final   Carbapenem resistance IMP NOT DETECTED NOT DETECTED Final   Carbapenem resistance KPC NOT DETECTED NOT DETECTED Final   Carbapenem resistance NDM NOT DETECTED NOT DETECTED Final   Carbapenem resist OXA 48 LIKE NOT DETECTED NOT DETECTED Final   Carbapenem resistance VIM NOT DETECTED NOT DETECTED Final    Comment: Performed at [facility], [address] [place], [place] [address]  Resp Panel by RT-PCR (Flu A&B, Covid) Nasopharyngeal Swab     Status: None   Collection Time: 01/05/21  8:10 PM   Specimen: Nasopharyngeal Swab; Nasopharyngeal(NP) swabs in vial transport medium  Result Value Ref Range Status   SARS Coronavirus 2 by RT PCR NEGATIVE NEGATIVE Final    Comment: (NOTE) SARS-CoV-2 target nucleic acids are NOT DETECTED.  The SARS-CoV-2 RNA is generally detectable in upper respiratory specimens during the acute phase of infection. The lowest concentration of SARS-CoV-2 viral copies this assay can detect is 138 copies/mL. A negative result does not preclude SARS-Cov-2 infection and should not be used as the sole basis for treatment or other patient  management decisions. A negative result may occur with  improper specimen collection/handling, submission of specimen other than nasopharyngeal swab, presence of viral mutation(s) within the areas targeted by this assay, and inadequate number of viral copies(<138 copies/mL). A negative result must be combined with clinical observations, patient history, and epidemiological information. The expected result is Negative.  Fact Sheet for Patients:  BloggerCourse.com  Fact Sheet for Healthcare Providers:  SeriousBroker.it  This test is no t yet approved or cleared by the [place] FDA and  has been authorized for detection and/or diagnosis of SARS-CoV-2 by FDA under an Emergency Use Authorization (EUA). This EUA will remain  in effect (meaning this test can be used) for the duration of the COVID-19 declaration under Section 564(b)(1) of the Act, 21 U.S.C.section 360bbb-3(b)(1), unless the authorization is terminated  or revoked sooner.       Influenza A by PCR NEGATIVE NEGATIVE Final   Influenza B by PCR NEGATIVE NEGATIVE Final    Comment: (NOTE) The Xpert Xpress SARS-CoV-2/FLU/RSV plus assay is intended as an aid in the diagnosis of influenza from Nasopharyngeal swab specimens and should not be used as a sole basis for treatment. Nasal washings and aspirates are unacceptable for Xpert Xpress SARS-CoV-2/FLU/RSV testing.  Fact Sheet for Patients: BloggerCourse.com  Fact Sheet for Healthcare Providers: SeriousBroker.it  This test is not yet approved or cleared by the [place] FDA and has been authorized for detection and/or diagnosis of SARS-CoV-2 by FDA under an Emergency Use Authorization (EUA). This EUA will remain in effect (meaning this test can be used) for the duration of the COVID-19 declaration under Section 564(b)(1) of the Act, 21 U.S.C. section 360bbb-3(b)(1),  unless the authorization is terminated or revoked.  Performed at [facility], [address] [place], [place] [address]   Urine culture     Status: Abnormal   Collection Time: 01/05/21  9:10 PM   Specimen: In/Out Cath Urine  Result Value Ref Range Status   Specimen Description   Final    IN/OUT CATH URINE Performed at [facility], [address], [place], [place] [address]    Special Requests   Final    NONE Performed at [facility], [address] [place], [place] [address]    Culture MULTIPLE SPECIES PRESENT, SUGGEST RECOLLECTION (A)  Final   Report Status 01/07/2021 FINAL  Final  MRSA PCR Screening     Status: Abnormal   Collection Time: 01/06/21  5:00 AM   Specimen: Nasopharyngeal  Result Value Ref Range Status   MRSA by PCR POSITIVE (A) NEGATIVE Final    Comment:        The GeneXpert MRSA Assay (FDA approved for NASAL specimens only), is one component of a comprehensive MRSA colonization surveillance program. It is not intended to diagnose MRSA infection nor to guide or monitor treatment for MRSA infections. RESULT CALLED TO, READ BACK BY AND VERIFIED WITH:  [facility] AT 0710 01/06/21 SDR Performed at [facility], [address], [place], [place] [address]          Radiology Studies: No results found.      Scheduled Meds: . ALPRAZolam  0.25 mg Oral Once  . chlordiazePOXIDE  25 mg Oral QID  . Chlorhexidine Gluconate Cloth  6 each Topical Daily  . folic acid  1 mg Oral Daily  . gabapentin  600 mg Oral BID  . heparin  5,000 Units Subcutaneous Q8H  . insulin aspart  0-20 Units Subcutaneous TID WC  . insulin aspart  0-5 Units Subcutaneous QHS  . insulin aspart  22 Units Subcutaneous TID WC  . insulin glargine  25 Units Subcutaneous QHS  . insulin glargine  50 Units Subcutaneous Daily  . losartan  50 mg Oral Daily  . multivitamin with minerals  1 tablet Oral Daily  . pantoprazole  40 mg Oral Daily  . Ensure  Max Protein  11 oz Oral BID  . thiamine  100 mg Oral Daily   Continuous Infusions: .  ceFAZolin (ANCEF) IV 2 g (01/14/21 0620)     LOS: 9 days    Time spent: 25 minutes    Joseph Art, DO [facility]   If 7PM-7AM, please contact night-coverage 01/14/2021, 10:20 AM

## 2021-01-14 NOTE — Evaluation (Signed)
Physical Therapy Evaluation Patient Details Name: Andrew Buchanan MRN: [id number] DOB: [birth year] Today's Date: 01/14/2021   History of Present Illness  50 y.o. male with medical history significant for type II DM, hypertension, pancreatitis, anxiety, alcohol use disorder, cocaine abuse, who was brought to the hospital because of altered mental status.  He was found to have hyperosmolar hyperglycemic state, severe sepsis and AKI.  He was admitted to the ICU for further management.  Hospital course was complicated by acute toxic metabolic encephalopathy/delirium/alcohol withdrawal syndrome.  Clinical Impression  Pt supine in bed with sitter checking blood glucose levels upon PT arrival. Pt able to complete bed mobility without assistance from therapist, but demonstrated some impulsiveness with transfers and gait during session. Donned gait belt to pt, pt performed ambulation for 359ft with CGA from therapist for safety with VCs for posture and pace during ambulation to decrease fall risk. Educated pt on the importance of taking his time with position changes and ambulation to decrease his risk for falls. Pt is eager to improve and return home, he would like a print out of exercises that he can complete while he is at the hospital. Pt's lunch had arrived following ambulation and pt voiced eagerness to eat. Pt left seated in recliner with sitter present for safety. Overall pt tolerated therapy well today, but would benefit from higher level balance training and more safety education to decrease his risk for falls.     Follow Up Recommendations No PT follow up    Equipment Recommendations       Recommendations for Other Services       Precautions / Restrictions Precautions Precautions: Fall Precaution Comments: Sitter present in romm Restrictions Weight Bearing Restrictions: No      Mobility  Bed Mobility Overal bed mobility: Independent             General bed mobility comments:  Pt abel to demonstrate safe bed mobility    Transfers Overall transfer level: Needs assistance   Transfers: Sit to/from Stand           General transfer comment: Pt requires supervision due to increaed impulsiveness and decreased stability in the standing position while completing dynamic activities  Ambulation/Gait Ambulation/Gait assistance: Supervision Gait Distance (Feet): 300 Feet Assistive device: Standard walker Gait Pattern/deviations: Shuffle     General Gait Details: Pt with decreased safety awareness tends to ambulate with a high cadence, but becomes unsteady at times due to decrease balance in the standing positioning requiring cueing to ambulate at a more steady and slower pace for safety and fall prevention  Stairs            Wheelchair Mobility    Modified Rankin (Stroke Patients Only)       Balance Overall balance assessment: Needs assistance Sitting-balance support: No upper extremity supported;Feet supported Sitting balance-Leahy Scale: Normal Sitting balance - Comments: Pt safe seated EOB   Standing balance support: No upper extremity supported;During functional activity Standing balance-Leahy Scale: Fair Standing balance comment: Pt able to ambulate 344ft with CGA from therapist as he tend sto ambulate too quickly and stagger slightly                             Pertinent Vitals/Pain Pain Assessment: No/denies pain Pain Location: Pt reports no pain today, but stated that his right knee sometimes gives him trouble    Home Living Family/patient expects to be discharged to:: Private residence Living Arrangements: Parent  Available Help at Discharge: Family;Other (Comment) (Pt reports he lives with his father who is not able to assist him with his needs) Type of Home: House Home Access: Stairs to enter Entrance Stairs-Rails: Left Entrance Stairs-Number of Steps: Pt reports he has to complete 16 steps to get to his bedroom Home  Layout: Two level Home Equipment: None      Prior Function Level of Independence: Independent         Comments: PLOF: I with all ADLs and IADLS, not currently working, his father drives him to the store daily so that he can buy himself a gallon of milk.     Hand Dominance        Extremity/Trunk Assessment        Lower Extremity Assessment Lower Extremity Assessment: Overall WFL for tasks assessed    Cervical / Trunk Assessment Cervical / Trunk Assessment: Normal  Communication   Communication: No difficulties  Cognition Arousal/Alertness: Awake/alert Behavior During Therapy: Impulsive Overall Cognitive Status: Impaired/Different from baseline Area of Impairment: Safety/judgement                         Safety/Judgement: Decreased awareness of safety     General Comments: Pt was a&ox4, but he is impulsive requiring cues to maintain safety and decrease pt's fall risk      General Comments      Exercises Other Exercises Other Exercises: PT educated pt on fall prevention and taking his time with transfers and ambulation to decrease his risk for falls.   Assessment/Plan    PT Assessment Patient needs continued PT services  PT Problem List Decreased strength;Decreased safety awareness;Decreased coordination;Decreased activity tolerance;Decreased balance       PT Treatment Interventions Therapeutic exercise;Gait training;Balance training;Stair training;Functional mobility training;Therapeutic activities;Patient/family education;Neuromuscular re-education    PT Goals (Current goals can be found in the Care Plan section)  Acute Rehab PT Goals Patient Stated Goal: to go home and see his dtr and grandson PT Goal Formulation: With patient Time For Goal Achievement: 01/28/21 Potential to Achieve Goals: Fair    Frequency Min 2X/week   Barriers to discharge        Co-evaluation               AM-PAC PT "6 Clicks" Mobility  Outcome Measure  Help needed turning from your back to your side while in a flat bed without using bedrails?: None Help needed moving from lying on your back to sitting on the side of a flat bed without using bedrails?: None Help needed moving to and from a bed to a chair (including a wheelchair)?: A Little Help needed standing up from a chair using your arms (e.g., wheelchair or bedside chair)?: A Little Help needed to walk in hospital room?: A Little Help needed climbing 3-5 steps with a railing? : A Little 6 Click Score: 20    End of Session Equipment Utilized During Treatment: Gait belt Activity Tolerance: Patient tolerated treatment well Patient left: in chair;with nursing/sitter in room   PT Visit Diagnosis: Unsteadiness on feet (R26.81);Other abnormalities of gait and mobility (R26.89);Difficulty in walking, not elsewhere classified (R26.2)    Time: 2952-8413 PT Time Calculation (min) (ACUTE ONLY): 15 min   Charges:   PT Evaluation $PT Eval Low Complexity: 1 Low          Minette Headland, PT, DPT 01/14/21, 1:37 PM   Devoria Albe 01/14/2021, 1:31 PM

## 2021-01-14 NOTE — Progress Notes (Signed)
  Chaplain On-Call responded to a page from [employer], who reported that the patient's mother, Andrew Buchanan, had called the Unit with her request to speak with a Chaplain.  Chaplain called Andrew Buchanan, and provided listening support and comfort as she discussed her spiritual and medical concerns for the patient.  Chaplain attempted two visits with the patient. He is sleeping soundly due to medications. Nurse will contact Chaplain when the patient is awake.  Chaplain Evelena Peat M.Div., [facility]

## 2021-01-15 LAB — GLUCOSE, CAPILLARY
Glucose-Capillary: 394 mg/dL — ABNORMAL HIGH (ref 70–99)
Glucose-Capillary: 211 mg/dL — ABNORMAL HIGH (ref 70–99)
Glucose-Capillary: 151 mg/dL — ABNORMAL HIGH (ref 70–99)
Glucose-Capillary: 313 mg/dL — ABNORMAL HIGH (ref 70–99)
Glucose-Capillary: 302 mg/dL — ABNORMAL HIGH (ref 70–99)

## 2021-01-15 MED ORDER — INSULIN ASPART 100 UNIT/ML ~~LOC~~ SOLN
18.0000 [IU] | Freq: Three times a day (TID) | SUBCUTANEOUS | Status: DC
  Administered 2021-01-15 – 2021-01-16 (×3): 18 [IU] via SUBCUTANEOUS
  Filled 2021-01-15 (×3): qty 1

## 2021-01-15 NOTE — Progress Notes (Signed)
Patient was very restless and uncooperative so I notified Manuela Schwartz about getting some medication to calm patient. Haldol and vistaril were given without any relief.  Appropriate orders were placed for one time order of ativan.  Will continue to monitor.  Andrew Buchanan

## 2021-01-15 NOTE — Progress Notes (Signed)
Family came in to visit bought him Sushi and watermelon - watermelon removed and patient became very upset.  Family left and patient went to sleep later woke up  Yelling I need my food now.  Advised to wait until blood sugar checked - was not happy

## 2021-01-15 NOTE — Progress Notes (Signed)
PROGRESS NOTE    Jeryl Umholtz  VEL:[id number] DOB: [birth year] DOA: 01/05/2021 PCP: Patient, No Pcp Per (Inactive)  Brief Narrative:  50 y.o. male with medical history significant for type II DM, hypertension, pancreatitis, anxiety, alcohol use disorder, cocaine abuse, who was brought to the hospital because of altered mental status.  He was found to have hyperosmolar hyperglycemic state, severe sepsis and AKI.  He was admitted to the ICU for further management.  Additional work-up revealed E. coli bacteremia.  He was treated with IV insulin infusion, IV fluids and empiric IV antibiotics.  He was transferred to the hospitalist service on 01/08/2021.  Hospital course was complicated by acute toxic metabolic encephalopathy/delirium/alcohol withdrawal syndrome. Stay complicated by difficult to control blood sugars due to patient non-compliance.     Assessment & Plan:   Principal Problem:   Sepsis with acute renal failure without septic shock (HCC) Active Problems:   Hyperosmolar hyperglycemic state (HHS) (HCC)   Malnutrition of moderate degree   AKI (acute kidney injury) (HCC)    Type 2 diabetes mellitus complicated by hyperosmolar hyperglycemic state Status post IV insulin, now titrated off Poor control over interval HbA1c greater than 15.5 indicating dismal control -patient had sweet tea 4/8 with blood sugars >500 as a result Increase Lantus NovoLog 18 units 3 times daily before meals and at bedtime Resistant sliding scale Carb modified diet- enforce Titrate glycemic regimen to achieve BG 140-180 Diabetes coordinator consult appreciated -nutrition consult  Severe sepsis secondary to E. coli bacteremia Source of bacteremia is unclear Urine culture with multiple species Sepsis physiology improving  IV Ancef 7-day total course- started 4/3 (got rocephin prior)-- stop date 4/10  Acute kidney injury In the setting of shock and encephalopathy Creatinine downtrending No IV  fluids necessary Encourage p.o. fluid intake Monitor daily BMP  Alcohol use disorder Acute alcohol withdrawal Acute toxic metabolic encephalopathy secondary to above Required Precedex drip now titrated off Continue Librium As needed Haldol for agitation --tele sitter for now but suspect he may not need -thiamine added with improvement in mental function  Cocaine abuse Counseled patient     DVT prophylaxis: SQ heparin Code Status: Full Disposition Plan:Status is: Inpatient  Remains inpatient appropriate because:Inpatient level of care appropriate due to severity of illness   Dispo: The patient is from: Home              Anticipated d/c is to: Home              Patient currently is not medically stable to d/c. in AM if blood sugars controlled   Difficult to place patient No Spoke with mother-- royce ann 4/9    Consultants:   Critical care     Subjective: No SOB, no CP-- asking about going home  Objective: Vitals:   01/14/21 1930 01/15/21 0438 01/15/21 0449 01/15/21 0849  BP: 116/72 128/77  118/78  Pulse: 98 94  88  Resp: 18 16  18   Temp: 97.8 F (36.6 C) 97.7 F (36.5 C)  98.2 F (36.8 C)  TempSrc: Oral Oral  Oral  SpO2: 100% 96%  99%  Weight:   86.6 kg   Height:        Intake/Output Summary (Last 24 hours) at 01/15/2021 0953 Last data filed at 01/15/2021 0015 Gross per 24 hour  Intake 1200 ml  Output --  Net 1200 ml   Filed Weights   01/13/21 0500 01/14/21 0359 01/15/21 0449  Weight: 87.6 kg 89.8 kg 86.6 kg  Examination:  General: Appearance:     Overweight male in no acute distress     Lungs:    respirations unlabored  Heart:    Normal heart rate. Normal rhythm. No murmurs, rubs, or gallops.   MS:   All extremities are intact.   Neurologic:   Awake, alert       Data Reviewed: I have personally reviewed following labs and imaging studies  CBC: Recent Labs  Lab 01/09/21 0317 01/10/21 0332 01/14/21 0546  WBC 12.3* 12.5* 13.1*   NEUTROABS 9.0* 8.7*  --   HGB 11.3* 10.9* 10.7*  HCT 34.9* 34.0* 30.9*  MCV 106.7* 109.3* 100.7*  PLT 216 156 182   Basic Metabolic Panel: Recent Labs  Lab 01/09/21 0317 01/10/21 0332 01/11/21 0350 01/12/21 0324 01/13/21 2126 01/14/21 0546  NA 146* 139 138 135 125* 136  K 3.5 4.2 4.0 4.2 5.5* 4.1  CL 110 105 105 101 91* 104  CO2 28 25 25 25 24 24   GLUCOSE 155* 320* 211* 280* 705*  720* 236*  BUN 21* 22* 18 21* 24* 24*  CREATININE 1.32* 1.18 1.08 1.11 1.33* 1.05  CALCIUM 8.4* 7.9* 8.4* 8.5* 8.9 8.8*  MG 2.0 1.8 1.9  --   --   --   PHOS 2.2* 2.7 2.6  --   --   --    GFR: Estimated Creatinine Clearance: 93.4 mL/min (by C-G formula based on SCr of 1.05 mg/dL). Liver Function Tests: No results for input(s): AST, ALT, ALKPHOS, BILITOT, PROT, ALBUMIN in the last 168 hours. No results for input(s): LIPASE, AMYLASE in the last 168 hours. No results for input(s): AMMONIA in the last 168 hours. Coagulation Profile: No results for input(s): INR, PROTIME in the last 168 hours. Cardiac Enzymes: No results for input(s): CKTOTAL, CKMB, CKMBINDEX, TROPONINI in the last 168 hours. BNP (last 3 results) No results for input(s): PROBNP in the last 8760 hours. HbA1C: No results for input(s): HGBA1C in the last 72 hours. CBG: Recent Labs  Lab 01/14/21 1053 01/14/21 1151 01/14/21 1629 01/14/21 1931 01/15/21 0732  GLUCAP 283* 222* 92 203* 394*   Lipid Profile: No results for input(s): CHOL, HDL, LDLCALC, TRIG, CHOLHDL, LDLDIRECT in the last 72 hours. Thyroid Function Tests: No results for input(s): TSH, T4TOTAL, FREET4, T3FREE, THYROIDAB in the last 72 hours. Anemia Panel: No results for input(s): VITAMINB12, FOLATE, FERRITIN, TIBC, IRON, RETICCTPCT in the last 72 hours. Sepsis Labs: No results for input(s): PROCALCITON, LATICACIDVEN in the last 168 hours.  Recent Results (from the past 240 hour(s))  Culture, blood (routine x 2)     Status: Abnormal   Collection Time: 01/05/21   8:01 PM   Specimen: BLOOD  Result Value Ref Range Status   Specimen Description   Final    BLOOD BLOOD RIGHT FOREARM Performed at [facility], [address], [place], [place]    Special Requests   Final    BOTTLES DRAWN AEROBIC AND ANAEROBIC Blood Culture results may not be optimal due to an excessive volume of blood received in culture bottles Performed at [facility], [address] [place], [place]    Culture  Setup Time   Final    Organism ID to follow GRAM NEGATIVE RODS IN BOTH AEROBIC AND ANAEROBIC BOTTLES CRITICAL RESULT CALLED TO, READ BACK BY AND VERIFIED WITH: LISA KLUTTZ AT 1046 01/06/21 SDR Performed at [facility], [address], [place], [place]    Culture ESCHERICHIA  COLI (A)  Final   Report Status 01/08/2021 FINAL  Final   Organism ID, Bacteria ESCHERICHIA COLI  Final      Susceptibility   Escherichia coli - MIC*    AMPICILLIN >=32 RESISTANT Resistant     CEFAZOLIN <=4 SENSITIVE Sensitive     CEFEPIME <=0.12 SENSITIVE Sensitive     CEFTAZIDIME <=1 SENSITIVE Sensitive     CEFTRIAXONE <=0.25 SENSITIVE Sensitive     CIPROFLOXACIN <=0.25 SENSITIVE Sensitive     GENTAMICIN <=1 SENSITIVE Sensitive     IMIPENEM <=0.25 SENSITIVE Sensitive     TRIMETH/SULFA <=20 SENSITIVE Sensitive     AMPICILLIN/SULBACTAM >=32 RESISTANT Resistant     PIP/TAZO <=4 SENSITIVE Sensitive     * ESCHERICHIA COLI  Culture, blood (routine x 2)     Status: None   Collection Time: 01/05/21  8:01 PM   Specimen: BLOOD  Result Value Ref Range Status   Specimen Description BLOOD BLOOD LEFT FOREARM  Final   Special Requests   Final    BOTTLES DRAWN AEROBIC AND ANAEROBIC Blood Culture results may not be optimal due to an inadequate volume of blood received in culture bottles   Culture   Final    NO GROWTH 5 DAYS Performed at [facility], [address] [place], [place] [address]    Report Status 01/10/2021 FINAL  Final   Blood Culture ID Panel (Reflexed)     Status: Abnormal   Collection Time: 01/05/21  8:01 PM  Result Value Ref Range Status   Enterococcus faecalis NOT DETECTED NOT DETECTED Final   Enterococcus Faecium NOT DETECTED NOT DETECTED Final   Listeria monocytogenes NOT DETECTED NOT DETECTED Final   Staphylococcus species NOT DETECTED NOT DETECTED Final   Staphylococcus aureus (BCID) NOT DETECTED NOT DETECTED Final   Staphylococcus epidermidis NOT DETECTED NOT DETECTED Final   Staphylococcus lugdunensis NOT DETECTED NOT DETECTED Final   Streptococcus species NOT DETECTED NOT DETECTED Final   Streptococcus agalactiae NOT DETECTED NOT DETECTED Final   Streptococcus pneumoniae NOT DETECTED NOT DETECTED Final   Streptococcus pyogenes NOT DETECTED NOT DETECTED Final   A.calcoaceticus-baumannii NOT DETECTED NOT DETECTED Final   Bacteroides fragilis NOT DETECTED NOT DETECTED Final   Enterobacterales DETECTED (A) NOT DETECTED Final    Comment: Enterobacterales represent a large order of gram negative bacteria, not a single organism. CRITICAL RESULT CALLED TO, READ BACK BY AND VERIFIED WITH:  LISA KLUTTZ AT 1046 01/06/21 SDR    Enterobacter cloacae complex NOT DETECTED NOT DETECTED Final   Escherichia coli DETECTED (A) NOT DETECTED Final    Comment: CRITICAL RESULT CALLED TO, READ BACK BY AND VERIFIED WITH:  LISA KLUTTZ AT 1046 01/06/21 SDR    Klebsiella aerogenes NOT DETECTED NOT DETECTED Final   Klebsiella oxytoca NOT DETECTED NOT DETECTED Final   Klebsiella pneumoniae NOT DETECTED NOT DETECTED Final   Proteus species NOT DETECTED NOT DETECTED Final   Salmonella species NOT DETECTED NOT DETECTED Final   Serratia marcescens NOT DETECTED NOT DETECTED Final   Haemophilus influenzae NOT DETECTED NOT DETECTED Final   Neisseria meningitidis NOT DETECTED NOT DETECTED Final   Pseudomonas aeruginosa NOT DETECTED NOT DETECTED Final   Stenotrophomonas maltophilia NOT DETECTED NOT DETECTED Final   Candida  albicans NOT DETECTED NOT DETECTED Final   Candida auris NOT DETECTED NOT DETECTED Final   Candida glabrata NOT DETECTED NOT DETECTED Final   Candida krusei NOT DETECTED NOT DETECTED Final   Candida parapsilosis NOT DETECTED NOT DETECTED Final  Candida tropicalis NOT DETECTED NOT DETECTED Final   Cryptococcus neoformans/gattii NOT DETECTED NOT DETECTED Final   CTX-M ESBL NOT DETECTED NOT DETECTED Final   Carbapenem resistance IMP NOT DETECTED NOT DETECTED Final   Carbapenem resistance KPC NOT DETECTED NOT DETECTED Final   Carbapenem resistance NDM NOT DETECTED NOT DETECTED Final   Carbapenem resist OXA 48 LIKE NOT DETECTED NOT DETECTED Final   Carbapenem resistance VIM NOT DETECTED NOT DETECTED Final    Comment: Performed at [facility], [address] [place], [place] [address]  Resp Panel by RT-PCR (Flu A&B, Covid) Nasopharyngeal Swab     Status: None   Collection Time: 01/05/21  8:10 PM   Specimen: Nasopharyngeal Swab; Nasopharyngeal(NP) swabs in vial transport medium  Result Value Ref Range Status   SARS Coronavirus 2 by RT PCR NEGATIVE NEGATIVE Final    Comment: (NOTE) SARS-CoV-2 target nucleic acids are NOT DETECTED.  The SARS-CoV-2 RNA is generally detectable in upper respiratory specimens during the acute phase of infection. The lowest concentration of SARS-CoV-2 viral copies this assay can detect is 138 copies/mL. A negative result does not preclude SARS-Cov-2 infection and should not be used as the sole basis for treatment or other patient management decisions. A negative result may occur with  improper specimen collection/handling, submission of specimen other than nasopharyngeal swab, presence of viral mutation(s) within the areas targeted by this assay, and inadequate number of viral copies(<138 copies/mL). A negative result must be combined with clinical observations, patient history, and epidemiological information. The expected result is Negative.  Fact  Sheet for Patients:  BloggerCourse.comhttps://www.fda.gov/media/152166/download  Fact Sheet for Healthcare Providers:  SeriousBroker.ithttps://www.fda.gov/media/152162/download  This test is no t yet approved or cleared by the [place] FDA and  has been authorized for detection and/or diagnosis of SARS-CoV-2 by FDA under an Emergency Use Authorization (EUA). This EUA will remain  in effect (meaning this test can be used) for the duration of the COVID-19 declaration under Section 564(b)(1) of the Act, 21 U.S.C.section 360bbb-3(b)(1), unless the authorization is terminated  or revoked sooner.       Influenza A by PCR NEGATIVE NEGATIVE Final   Influenza B by PCR NEGATIVE NEGATIVE Final    Comment: (NOTE) The Xpert Xpress SARS-CoV-2/FLU/RSV plus assay is intended as an aid in the diagnosis of influenza from Nasopharyngeal swab specimens and should not be used as a sole basis for treatment. Nasal washings and aspirates are unacceptable for Xpert Xpress SARS-CoV-2/FLU/RSV testing.  Fact Sheet for Patients: BloggerCourse.comhttps://www.fda.gov/media/152166/download  Fact Sheet for Healthcare Providers: SeriousBroker.ithttps://www.fda.gov/media/152162/download  This test is not yet approved or cleared by the [place] FDA and has been authorized for detection and/or diagnosis of SARS-CoV-2 by FDA under an Emergency Use Authorization (EUA). This EUA will remain in effect (meaning this test can be used) for the duration of the COVID-19 declaration under Section 564(b)(1) of the Act, 21 U.S.C. section 360bbb-3(b)(1), unless the authorization is terminated or revoked.  Performed at [facility], [address] [place], [place] [address]   Urine culture     Status: Abnormal   Collection Time: 01/05/21  9:10 PM   Specimen: In/Out Cath Urine  Result Value Ref Range Status   Specimen Description   Final    IN/OUT CATH URINE Performed at [facility], [address] [place], [place] [address]    Special Requests    Final    NONE Performed at [facility], [address] [place], [place] [address]  Culture MULTIPLE SPECIES PRESENT, SUGGEST RECOLLECTION (A)  Final   Report Status 01/07/2021 FINAL  Final  MRSA PCR Screening     Status: Abnormal   Collection Time: 01/06/21  5:00 AM   Specimen: Nasopharyngeal  Result Value Ref Range Status   MRSA by PCR POSITIVE (A) NEGATIVE Final    Comment:        The GeneXpert MRSA Assay (FDA approved for NASAL specimens only), is one component of a comprehensive MRSA colonization surveillance program. It is not intended to diagnose MRSA infection nor to guide or monitor treatment for MRSA infections. RESULT CALLED TO, READ BACK BY AND VERIFIED WITH:  [facility] AT 0710 01/06/21 SDR Performed at [facility], [address] [place], [place] [address]          Radiology Studies: No results found.      Scheduled Meds: . chlordiazePOXIDE  25 mg Oral QID  . Chlorhexidine Gluconate Cloth  6 each Topical Daily  . folic acid  1 mg Oral Daily  . gabapentin  600 mg Oral BID  . heparin  5,000 Units Subcutaneous Q8H  . insulin aspart  0-20 Units Subcutaneous TID WC  . insulin aspart  0-5 Units Subcutaneous QHS  . insulin aspart  18 Units Subcutaneous TID WC  . insulin glargine  25 Units Subcutaneous QHS  . insulin glargine  50 Units Subcutaneous Daily  . losartan  50 mg Oral Daily  . multivitamin with minerals  1 tablet Oral Daily  . pantoprazole  40 mg Oral Daily  . Ensure Max Protein  11 oz Oral BID  . thiamine  100 mg Oral Daily   Continuous Infusions: .  ceFAZolin (ANCEF) IV 2 g (01/15/21 0445)     LOS: 10 days    Time spent: 25 minutes    Joseph Art, DO [facility]   If 7PM-7AM, please contact night-coverage 01/15/2021, 9:53 AM

## 2021-01-15 NOTE — Plan of Care (Signed)
Patient is non-compliant with carb modified diet and refuses to manage his diabetes.

## 2021-01-15 NOTE — Plan of Care (Signed)

## 2021-01-15 NOTE — Progress Notes (Signed)
NUTRITION NOTE RD working remotely.   RD consulted for nutrition education regarding diabetes.   Lab Results  Component Value Date   HGBA1C >15.5 (H) 01/06/2021    Initial RD assessment completed on 4/6 at which time patient met criteria for moderate malnutrition.   Patient was educated on DM diet by a [place] RD on 05/20/20. Noted current HgbA1c.   Will put in order for outpatient education referral and place "Carbohydrate Counting for People with Diabetes" handout from the Academy of Nutrition and Dietetics in patient's Discharge Instructions.   Expect poor compliance given that patient has had DM diet education previously and HgbA1c is >15.5%.   Current diet order is Carb Modified, patient is consuming 100% of meals since 4/7. Ensure Max is ordered BID and he has been accepting this supplement 100% of the time offered (each provides 150 kcal and 30 grams protein).  Labs and medications reviewed.  RD will continue to follow per protocol.       Jarome Matin, MS, RD, LDN, CNSC Inpatient Clinical Dietitian RD pager # available in [place]  After hours/weekend pager # available in [facility]

## 2021-01-16 LAB — CBC
HCT: 31 % — ABNORMAL LOW (ref 39.0–52.0)
Hemoglobin: 10.7 g/dL — ABNORMAL LOW (ref 13.0–17.0)
MCH: 35.3 pg — ABNORMAL HIGH (ref 26.0–34.0)
MCHC: 34.5 g/dL (ref 30.0–36.0)
MCV: 102.3 fL — ABNORMAL HIGH (ref 80.0–100.0)
Platelets: 185 10*3/uL (ref 150–400)
RBC: 3.03 MIL/uL — ABNORMAL LOW (ref 4.22–5.81)
RDW: 13.2 % (ref 11.5–15.5)
WBC: 10.3 10*3/uL (ref 4.0–10.5)
nRBC: 0.2 % (ref 0.0–0.2)

## 2021-01-16 LAB — BASIC METABOLIC PANEL
Anion gap: 9 (ref 5–15)
BUN: 39 mg/dL — ABNORMAL HIGH (ref 6–20)
CO2: 23 mmol/L (ref 22–32)
Calcium: 9.2 mg/dL (ref 8.9–10.3)
Chloride: 102 mmol/L (ref 98–111)
Creatinine, Ser: 1.15 mg/dL (ref 0.61–1.24)
GFR, Estimated: >60 mL/min (ref >60)
Glucose, Bld: 312 mg/dL — ABNORMAL HIGH (ref 70–99)
Potassium: 4 mmol/L (ref 3.5–5.1)
Sodium: 134 mmol/L — ABNORMAL LOW (ref 135–145)

## 2021-01-16 LAB — GLUCOSE, CAPILLARY
Glucose-Capillary: 300 mg/dL — ABNORMAL HIGH (ref 70–99)
Glucose-Capillary: 186 mg/dL — ABNORMAL HIGH (ref 70–99)

## 2021-01-16 MED ORDER — INSULIN GLARGINE 100 UNIT/ML SOLOSTAR PEN: PEN_INJECTOR | SUBCUTANEOUS | 2 refills | Status: DC

## 2021-01-16 MED ORDER — INSULIN PEN NEEDLE 31G X 5 MM MISC: 1.0000 | Freq: Four times a day (QID) | 2 refills | Status: AC

## 2021-01-16 MED ORDER — INSULIN ASPART 100 UNIT/ML FLEXPEN: 20.0000 [IU] | PEN_INJECTOR | Freq: Three times a day (TID) | SUBCUTANEOUS | 2 refills | Status: DC

## 2021-01-16 MED ORDER — INSULIN GLARGINE 100 UNIT/ML SOLOSTAR PEN: 30.0000 [IU] | PEN_INJECTOR | Freq: Every day | SUBCUTANEOUS | 2 refills | Status: DC

## 2021-01-16 MED ORDER — BLOOD GLUCOSE MONITOR KIT: PACK | 0 refills | Status: AC

## 2021-01-16 NOTE — Plan of Care (Signed)
  Problem: Health Behavior/Discharge Planning: Goal: Ability to manage health-related needs will improve Outcome: Progressing   Problem: Clinical Measurements: Goal: Respiratory complications will improve Outcome: Progressing   Problem: Nutrition: Goal: Adequate nutrition will be maintained Outcome: Progressing   Problem: Coping: Goal: Level of anxiety will decrease Outcome: Progressing   Problem: Safety: Goal: Ability to remain free from injury will improve Outcome: Progressing

## 2021-01-16 NOTE — Progress Notes (Signed)
Erland Migdalia Dk to be D/C'd Home per MD order.  Discussed prescriptions and follow up appointments with the patient. Prescriptions given to patient, medication list explained in detail. Pt verbalized understanding.  Allergies as of 01/16/2021      Reactions   Antihistamines, Chlorpheniramine-type Anaphylaxis      Medication List    STOP taking these medications   ALPRAZolam 0.5 MG tablet Commonly known as: XANAX   insulin aspart 100 UNIT/ML injection Commonly known as: novoLOG Replaced by: insulin aspart 100 UNIT/ML FlexPen   insulin glargine 100 UNIT/ML injection Commonly known as: LANTUS Replaced by: insulin glargine 100 UNIT/ML Solostar Pen   Vitamin D3 1.25 MG (50000 UT) Caps     TAKE these medications   blood glucose meter kit and supplies Kit Dispense based on patient and insurance preference. Use up to four times daily as directed.   gabapentin 300 MG capsule Commonly known as: NEURONTIN Take 2 capsules (600 mg total) by mouth 2 (two) times daily. What changed: when to take this   insulin aspart 100 UNIT/ML FlexPen Commonly known as: NOVOLOG Inject 20 Units into the skin 3 (three) times daily with meals. Replaces: insulin aspart 100 UNIT/ML injection   insulin glargine 100 UNIT/ML Solostar Pen Commonly known as: LANTUS 50 units in the morning, 30 units at bedtime Replaces: insulin glargine 100 UNIT/ML injection   Insulin Pen Needle 31G X 5 MM Misc 1 each by Does not apply route 4 (four) times daily.   losartan 50 MG tablet Commonly known as: COZAAR Take 1 tablet (50 mg total) by mouth daily. What changed: how much to take   metFORMIN 1000 MG tablet Commonly known as: GLUCOPHAGE Take 1 tablet (1,000 mg total) by mouth 2 (two) times daily with a meal.   multivitamin with minerals Tabs tablet Take 1 tablet by mouth daily.       Vitals:   01/16/21 0800 01/16/21 1137  BP: 112/88 112/75  Pulse: 98 94  Resp: 18 16  Temp: (!) 97.4 F (36.3 C) (!)  97.4 F (36.3 C)  SpO2: 99% 100%    Skin clean, dry and intact without evidence of skin break down, no evidence of skin tears noted. IV catheter discontinued intact. Site without signs and symptoms of complications. Dressing and pressure applied. Pt denies pain at this time. No complaints noted.  An After Visit Summary was printed and given to the patient. Patient escorted via [place], and D/C home via private auto.  Fuller Mandril, RN

## 2021-01-16 NOTE — TOC Transition Note (Addendum)
Transition of Care [facility]) - CM/SW Discharge Note   Patient Details  Name: Andrew Buchanan MRN: [id number] Date of Birth: 1971-02-05  [facility]) CM/SW Contact:  Chapman Fitch, RN Phone Number: 01/16/2021, 9:04 AM   Clinical Narrative:     Patient to discharge home today PT hac evaluated patient and patient does not require and PT follow up at discharge or DME  Patient has already been provided SA resources this admission.   Transportation home set up with [employer]   Final next level of care: Home/Self Care Barriers to Discharge: No Barriers Identified   Patient Goals and CMS Choice        Discharge Placement                       Discharge Plan and Services                                     Social Determinants of Health (SDOH) Interventions     Readmission Risk Interventions Readmission Risk Prevention Plan 01/12/2021  Transportation Screening Complete  PCP or Specialist Appt within 3-5 Days Complete  Social Work Consult for Recovery Care Planning/Counseling Complete  Palliative Care Screening Not Applicable  Medication Review Oceanographer) Complete  Some recent data might be hidden

## 2021-01-16 NOTE — Progress Notes (Signed)
Per Patient's father he has a doctors appointment and will not be able to pick up to after 2 pm today. Mr. Andrew Buchanan stated he would call when on the way.   Madie Reno, RN

## 2021-01-16 NOTE — Progress Notes (Signed)
Inpatient Diabetes Program Recommendations  AACE/ADA: New Consensus Statement on Inpatient Glycemic Control (2015)  Target Ranges:  Prepandial:   less than 140 mg/dL      Peak postprandial:   less than 180 mg/dL (1-2 hours)      Critically ill patients:  140 - 180 mg/dL   Results for Andrew, Buchanan (MRN [id number]) as of 01/16/2021 07:58  Ref. Range 01/14/2021 07:45 01/14/2021 10:53 01/14/2021 11:51 01/14/2021 16:29 01/14/2021 19:31  Glucose-Capillary Latest Ref Range: 70 - 99 mg/dL 878 (H)  37 units NOVOLOG  283 (H)    50 units LANTUS 222 (H)  29 units NOVOLOG  92  22 units NOVOLOG  203 (H)  2 units NOVOLOG  25 units LANTUS    Results for Andrew, Buchanan (MRN [id number]) as of 01/16/2021 07:58  Ref. Range 01/15/2021 07:32 01/15/2021 11:46 01/15/2021 16:54 01/15/2021 21:00 01/15/2021 23:09  Glucose-Capillary Latest Ref Range: 70 - 99 mg/dL 096 (H)  42 units NOVOLOG  50 units LANTUS 211 (H)  25 units NOVOLOG  151 (H)  22 units NOVOLOG  313 (H)  4 units NOVOLOG  25 units LANTUS 302 (H)   Results for Andrew, Buchanan (MRN [id number]) as of 01/16/2021 07:58  Ref. Range 01/16/2021 07:47  Glucose-Capillary Latest Ref Range: 70 - 99 mg/dL 654 (H)     Home DM Meds: Novolog 6 units TID Lantus 35 units Daily Metformin 1000 mg BID  Current Orders: Lantus 50 units Daily                            Lantus 25 units QHS                            Novolog Resistant Correction Scale/ SSI (0-20 units) TID AC + HS                            Novolog 18 units TID with meals    MD- Note pt continues to have elevated CBGs--requiring very large amounts of Insulin--Note family brought in food yesterday--expect pt eating in between meals and eating high carb foods despite counseling  Please consider:  1. Increase PM dose of Lantus to 30 units QHS  2. Increase Novolog Meal Coverage to 20 units TID    --Will follow  patient during hospitalization--  Ambrose Finland RN, MSN, CDE Diabetes Coordinator Inpatient Glycemic Control Team Team Pager: [phone] (8a-5p)

## 2021-01-16 NOTE — Discharge Instructions (Signed)
Carbohydrate Counting For People With Diabetes  Foods with carbohydrates make your blood glucose level go up. Learning how to count carbohydrates can help you control your blood glucose levels. First, identify the foods you eat that contain carbohydrates. Then, using the Foods with Carbohydrates chart, determine about how much carbohydrates are in your meals and snacks. Make sure you are eating foods with fiber, protein, and healthy fat along with your carbohydrate foods. Foods with Carbohydrates The following table shows carbohydrate foods that have about 15 grams of carbohydrate each. Using measuring cups, spoons, or a food scale when you first begin learning about carbohydrate counting can help you learn about the portion sizes you typically eat. The following foods have 15 grams carbohydrate each:  Grains . 1 slice bread (1 ounce)  . 1 small tortilla (6-inch size)  .  large bagel (1 ounce)  . 1/3 cup pasta or rice (cooked)  .  hamburger or hot dog bun ( ounce)  .  cup cooked cereal  .  to  cup ready-to-eat cereal  . 2 taco shells (5-inch size) Fruit . 1 small fresh fruit ( to 1 cup)  .  medium banana  . 17 small grapes (3 ounces)  . 1 cup melon or berries  .  cup canned or frozen fruit  . 2 tablespoons dried fruit (blueberries, cherries, cranberries, raisins)  .  cup unsweetened fruit juice  Starchy Vegetables .  cup cooked beans, peas, corn, potatoes/sweet potatoes  .  large baked potato (3 ounces)  . 1 cup acorn or butternut squash  Snack Foods . 3 to 6 crackers  . 8 potato chips or 13 tortilla chips ( ounce to 1 ounce)  . 3 cups popped popcorn  Dairy . 3/4 cup (6 ounces) nonfat plain yogurt, or yogurt with sugar-free sweetener  . 1 cup milk  . 1 cup plain rice, soy, coconut or flavored almond milk Sweets and Desserts .  cup ice cream or frozen yogurt  . 1 tablespoon jam, jelly, pancake syrup, table sugar, or honey  . 2 tablespoons light pancake syrup  . 1 inch  square of frosted cake or 2 inch square of unfrosted cake  . 2 small cookies (2/3 ounce each) or  large cookie  Sometimes you'll have to estimate carbohydrate amounts if you don't know the exact recipe. One cup of mixed foods like soups can have 1 to 2 carbohydrate servings, while some casseroles might have 2 or more servings of carbohydrate. Foods that have less than 20 calories in each serving can be counted as "free" foods. Count 1 cup raw vegetables, or  cup cooked non-starchy vegetables as "free" foods. If you eat 3 or more servings at one meal, then count them as 1 carbohydrate serving.  Foods without Carbohydrates  Not all foods contain carbohydrates. Meat, some dairy, fats, non-starchy vegetables, and many beverages don't contain carbohydrate. So when you count carbohydrates, you can generally exclude chicken, pork, beef, fish, seafood, eggs, tofu, cheese, butter, sour cream, avocado, nuts, seeds, olives, mayonnaise, water, black coffee, unsweetened tea, and zero-calorie drinks. Vegetables with no or low carbohydrate include green beans, cauliflower, tomatoes, and onions. How much carbohydrate should I eat at each meal?  Carbohydrate counting can help you plan your meals and manage your weight. Following are some starting points for carbohydrate intake at each meal. Work with your registered dietitian nutritionist to find the best range that works for your blood glucose and weight.   To Lose Weight To  Maintain Weight  Women 2 - 3 carb servings 3 - 4 carb servings  Men 3 - 4 carb servings 4 - 5 carb servings  Checking your blood glucose after meals will help you know if you need to adjust the timing, type, or number of carbohydrate servings in your meal plan. Achieve and keep a healthy body weight by balancing your food intake and physical activity.  Tips How should I plan my meals?  Plan for half the food on your plate to include non-starchy vegetables, like salad greens, broccoli, or  carrots. Try to eat 3 to 5 servings of non-starchy vegetables every day. Have a protein food at each meal. Protein foods include chicken, fish, meat, eggs, or beans (note that beans contain carbohydrate). These two food groups (non-starchy vegetables and proteins) are low in carbohydrate. If you fill up your plate with these foods, you will eat less carbohydrate but still fill up your stomach. Try to limit your carbohydrate portion to  of the plate.  What fats are healthiest to eat?  Diabetes increases risk for heart disease. To help protect your heart, eat more healthy fats, such as olive oil, nuts, and avocado. Eat less saturated fats like butter, cream, and high-fat meats, like bacon and sausage. Avoid trans fats, which are in all foods that list "partially hydrogenated oil" as an ingredient. What should I drink?  Choose drinks that are not sweetened with sugar. The healthiest choices are water, carbonated or seltzer waters, and tea and coffee without added sugars.  Sweet drinks will make your blood glucose go up very quickly. One serving of soda or energy drink is  cup. It is best to drink these beverages only if your blood glucose is low.  Artificially sweetened, or diet drinks, typically do not increase your blood glucose if they have zero calories in them. Read labels of beverages, as some diet drinks do have carbohydrate and will raise your blood glucose. Label Reading Tips Read Nutrition Facts labels to find out how many grams of carbohydrate are in a food you want to eat. Don't forget: sometimes serving sizes on the label aren't the same as how much food you are going to eat, so you may need to calculate how much carbohydrate is in the food you are serving yourself.   Carbohydrate Counting for People with Diabetes Sample 1-Day Menu  Breakfast  cup yogurt, low fat, low sugar (1 carbohydrate serving)   cup cereal, ready-to-eat, unsweetened (1 carbohydrate serving)  1 cup strawberries (1  carbohydrate serving)   cup almonds ( carbohydrate serving)  Lunch 1, 5 ounce can chunk light tuna  2 ounces cheese, low fat cheddar  6 whole wheat crackers (1 carbohydrate serving)  1 small apple (1 carbohydrate servings)   cup carrots ( carbohydrate serving)   cup snap peas  1 cup 1% milk (1 carbohydrate serving)   Evening Meal Stir fry made with: 3 ounces chicken  1 cup brown rice (3 carbohydrate servings)   cup broccoli ( carbohydrate serving)   cup green beans   cup onions  1 tablespoon olive oil  2 tablespoons teriyaki sauce ( carbohydrate serving)  Evening Snack 1 extra small banana (1 carbohydrate serving)  1 tablespoon peanut butter   Carbohydrate Counting for People with Diabetes Vegan Sample 1-Day Menu  Breakfast 1 cup cooked oatmeal (2 carbohydrate servings)   cup blueberries (1 carbohydrate serving)  2 tablespoons flaxseeds  1 cup soymilk fortified with calcium and vitamin D  1  cup coffee  Lunch 2 slices whole wheat bread (2 carbohydrate servings)   cup baked tofu   cup lettuce  2 slices tomato  2 slices avocado   cup baby carrots ( carbohydrate serving)  1 orange (1 carbohydrate serving)  1 cup soymilk fortified with calcium and vitamin D   Evening Meal Burrito made with: 1 6-inch corn tortilla (1 carbohydrate serving)  1 cup refried vegetarian beans (2 carbohydrate servings)   cup chopped tomatoes   cup lettuce   cup salsa  1/3 cup brown rice (1 carbohydrate serving)  1 tablespoon olive oil for rice   cup zucchini   Evening Snack 6 small whole grain crackers (1 carbohydrate serving)  2 apricots ( carbohydrate serving)   cup unsalted peanuts ( carbohydrate serving)    Carbohydrate Counting for People with Diabetes Vegetarian (Lacto-Ovo) Sample 1-Day Menu  Breakfast 1 cup cooked oatmeal (2 carbohydrate servings)   cup blueberries (1 carbohydrate serving)  2 tablespoons flaxseeds  1 egg  1 cup 1% milk (1 carbohydrate serving)  1  cup coffee  Lunch 2 slices whole wheat bread (2 carbohydrate servings)  2 ounces low-fat cheese   cup lettuce  2 slices tomato  2 slices avocado   cup baby carrots ( carbohydrate serving)  1 orange (1 carbohydrate serving)  1 cup unsweetened tea  Evening Meal Burrito made with: 1 6-inch corn tortilla (1 carbohydrate serving)   cup refried vegetarian beans (1 carbohydrate serving)   cup tomatoes   cup lettuce   cup salsa  1/3 cup brown rice (1 carbohydrate serving)  1 tablespoon olive oil for rice   cup zucchini  1 cup 1% milk (1 carbohydrate serving)  Evening Snack 6 small whole grain crackers (1 carbohydrate serving)  2 apricots ( carbohydrate serving)   cup unsalted peanuts ( carbohydrate serving)    Copyright 2020  Academy of [employer] and Dietetics. All rights reserved.  Using Nutrition Labels: Carbohydrate  . Serving Size  . Look at the serving size. All the information on the label is based on this portion. Jolyne Loa Per Container  . The number of servings contained in the package. . Guidelines for Carbohydrate  . Look at the total grams of carbohydrate in the serving size.  . 1 carbohydrate choice = 15 grams of carbohydrate. Range of Carbohydrate Grams Per Choice  Carbohydrate Grams/Choice Carbohydrate Choices  6-10   11-20 1  21-25 1  26-35 2  36-40 2  41-50 3  51-55 3  56-65 4  66-70 4  71-80 5    Copyright 2020  Academy of Nutrition and Dietetics. All rights reserved.      Alcohol Abuse and Dependence Information, Adult Alcohol is a widely available drug. People drink alcohol in different amounts. People who drink alcohol very often and in large amounts often have problems during and after drinking. They may develop what is called an alcohol use disorder. There are two main types of alcohol use disorders:  Alcohol abuse. This is when you use alcohol too much or too often. You may use alcohol to make yourself feel happy or to reduce  stress. You may have a hard time setting a limit on the amount you drink.  Alcohol dependence. This is when you use alcohol consistently for a period of time, and your body changes as a result. This can make it hard to stop drinking because you may start to feel sick or feel different when you do not use alcohol.  These symptoms are known as withdrawal. How can alcohol abuse and dependence affect me? Alcohol abuse and dependence can have a negative effect on your life. Drinking too much can lead to addiction. You may feel like you need alcohol to function normally. You may drink alcohol before work in the morning, during the day, or as soon as you get home from work in the evening. These actions can result in:  Poor work performance.  Job loss.  Financial problems.  Car crashes or criminal charges from driving after drinking alcohol.  Problems in your relationships with friends and family.  Losing the trust and respect of coworkers, friends, and family. Drinking heavily over a long period of time can permanently damage your body and brain, and can cause lifelong health issues, such as:  Damage to your liver or pancreas.  Heart problems, high blood pressure, or stroke.  Certain cancers.  Decreased ability to fight infections.  Brain or nerve damage.  Depression.  Early (premature) death. If you are careless or you crave alcohol, it is easy to drink more than your body can handle (overdose). Alcohol overdose is a serious situation that requires hospitalization. It may lead to permanent injuries or death. What can increase my risk?  Having a family history of alcohol abuse.  Having depression or other mental health conditions.  Beginning to drink at an early age.  Binge drinking often.  Experiencing trauma, stress, and an unstable home life during childhood.  Spending time with people who drink often. What actions can I take to prevent or manage alcohol abuse and  dependence?  Do not drink alcohol if: ? Your health care provider tells you not to drink. ? You are pregnant, may be pregnant, or are planning to become pregnant.  If you drink alcohol: ? Limit how much you use to:  0-1 drink a day for women.  0-2 drinks a day for men. ? Be aware of how much alcohol is in your drink. In the [place] one drink equals one 12 oz bottle of beer (355 mL), one 5 oz glass of wine (148 mL), or one 1 oz glass of hard liquor (44 mL).  Stop drinking if you have been drinking too much. This can be very hard to do if you are used to abusing alcohol. If you begin to have withdrawal symptoms, talk with your health care provider or a person that you trust. These symptoms may include anxiety, shaky hands, headache, nausea, sweating, or not being able to sleep.  Choose to drink nonalcoholic beverages in social gatherings and places where there may be alcohol. Activity  Spend more time on activities that you enjoy that do not involve alcohol, like hobbies or exercise.  Find healthy ways to cope with stress, such as exercise, meditation, or spending time with people you care about. General information  Talk to your family, coworkers, and friends about supporting you in your efforts to stop drinking. If they drink, ask them not to drink around you. Spend more time with people who do not drink alcohol.  If you think that you have an alcohol dependency problem: ? Tell friends or family about your concerns. ? Talk with your health care provider or another health professional about where to get help. ? Work with a Paramedic and a Network engineer. ? Consider joining a support group for people who struggle with alcohol abuse and dependence. Where to find support  Your health care provider.  SMART Recovery: www.smartrecovery.org Therapy and  support groups  Local treatment centers or chemical dependency counselors.  Local AA groups in your community:  SalaryStart.tn   Where to find more information  Centers for Disease Control and Prevention: FootballExhibition.com.br  [employer] on Alcohol Abuse and Alcoholism: BasicStudents.dk  Alcoholics Anonymous (AA): SalaryStart.tn Contact a health care provider if:  You drank more or for longer than you intended on more than one occasion.  You tried to stop drinking or to cut back on how much you drink, but you were not able to.  You often drink to the point of vomiting or passing out.  You want to drink so badly that you cannot think about anything else.  You have problems in your life due to drinking, but you continue to drink.  You keep drinking even though you feel anxious, depressed, or have experienced memory loss.  You have stopped doing the things you used to enjoy in order to drink.  You have to drink more than you used to in order to get the effect you want.  You experience anxiety, sweating, nausea, shakiness, and trouble sleeping when you try to stop drinking. Get help right away if:  You have thoughts about hurting yourself or others.  You have serious withdrawal symptoms, including: ? Confusion. ? Racing heart. ? High blood pressure. ? Fever. If you ever feel like you may hurt yourself or others, or have thoughts about taking your own life, get help right away. You can go to your nearest emergency department or call:  Your local emergency services (911 in the [place]  A suicide crisis helpline, such as the National Suicide Prevention Lifeline at (561) 375-8559. This is open 24 hours a day. Summary  Alcohol abuse and dependence can have a negative effect on your life. Drinking too much or too often can lead to addiction.  If you drink alcohol, limit how much you use.  If you are having trouble keeping your drinking under control, find ways to change your behavior. Hobbies, calming activities, exercise, or support groups can help.  If you feel you need help with changing your  drinking habits, talk with your health care provider, a good friend, or a therapist, or go to an AA group. This information is not intended to replace advice given to you by your health care provider. Make sure you discuss any questions you have with your health care provider. Document Revised: 01/13/2019 Document Reviewed: 12/02/2018 Elsevier Patient Education  2021 [employer].

## 2021-01-16 NOTE — Discharge Summary (Addendum)
Physician Discharge Summary  Eisen Robenson DXI:[id number] DOB: [birth year] DOA: 01/05/2021  PCP: Mechele Claude, FNP  Admit date: 01/05/2021 Discharge date: 01/16/2021  Admitted From: Home Disposition:  Home  Recommendations for Outpatient Follow-up:  1. Follow up with PCP as scheduled on 01/26/21  Discharge Condition: Stable CODE STATUS: Full  Diet recommendation: Carb modified   Brief/Interim Summary: Andrew Buchanan is a 50 y.o.malewith medical history significant for type II DM, hypertension, pancreatitis, anxiety, alcohol use disorder, cocaine abuse, who was brought to the hospital because of altered mental status. He was found to have hyperosmolar hyperglycemic state, severe sepsis and AKI. He was admitted to the ICU for further management. Additional work-up revealed E. coli bacteremia. He was treated with IV insulin infusion, IV fluids and empiric IV antibiotics. He was transferred to the hospitalist service on 01/08/2021. Hospital course was complicated by acute toxic metabolic encephalopathy/delirium/alcohol withdrawal syndrome as well as difficult to control blood sugars due to patient non-compliance with diet. Patient worked with PT and did well with ambulation. On day of discharge, his main complaint was restless legs.   Discharge Diagnoses:  Principal Problem:   Hyperosmolar hyperglycemic state (HHS) ([place]) Active Problems:   Alcohol abuse   Noncompliance with medications   Sepsis with acute renal failure without septic shock (HCC)   Malnutrition of moderate degree   AKI (acute kidney injury) ([place])  Type 2 diabetes mellitus complicated by hyperosmolar hyperglycemic state Status post IV insulin, now titrated off HbA1c greater than 15.5 indicating dismal control Diabetes coordinator consult appreciated Lantus, Novolog dose adjusted and prescribed for discharge   Severe sepsis secondary to E. coli bacteremia Source of bacteremia is unclear Urine  culture with multiple species Completed antibiotic course. Leukocytosis resolved.   Acute kidney injury In the setting of shock and encephalopathy Resolved   Alcohol use disorder Acute alcohol withdrawal Acute toxic metabolic encephalopathy secondary to above Required Precedex drip now titrated off Continue Librium Out of window for withdrawal now   Cocaine abuse Counseled patient    Discharge Instructions  Discharge Instructions    Amb Referral to Nutrition and Diabetic Education   Complete by: As directed    Call MD for:  difficulty breathing, headache or visual disturbances   Complete by: As directed    Call MD for:  extreme fatigue   Complete by: As directed    Call MD for:  persistant dizziness or light-headedness   Complete by: As directed    Call MD for:  persistant nausea and vomiting   Complete by: As directed    Call MD for:  severe uncontrolled pain   Complete by: As directed    Call MD for:  temperature >100.4   Complete by: As directed    Diet Carb Modified   Complete by: As directed    Discharge instructions   Complete by: As directed    You were cared for by a hospitalist during your hospital stay. If you have any questions about your discharge medications or the care you received while you were in the hospital after you are discharged, you can call the unit and ask to speak with the hospitalist on call if the hospitalist that took care of you is not available. Once you are discharged, your primary care physician will handle any further medical issues. Please note that NO REFILLS for any discharge medications will be authorized once you are discharged, as it is imperative that you return to your primary care physician (or establish  a relationship with a primary care physician if you do not have one) for your aftercare needs so that they can reassess your need for medications and monitor your lab values.   Increase activity slowly   Complete by: As directed       Allergies as of 01/16/2021      Reactions   Antihistamines, Chlorpheniramine-type Anaphylaxis      Medication List    STOP taking these medications   ALPRAZolam 0.5 MG tablet Commonly known as: XANAX   insulin aspart 100 UNIT/ML injection Commonly known as: novoLOG Replaced by: insulin aspart 100 UNIT/ML FlexPen   insulin glargine 100 UNIT/ML injection Commonly known as: LANTUS Replaced by: insulin glargine 100 UNIT/ML Solostar Pen   Vitamin D3 1.25 MG (50000 UT) Caps     TAKE these medications   blood glucose meter kit and supplies Kit Dispense based on patient and insurance preference. Use up to four times daily as directed.   gabapentin 300 MG capsule Commonly known as: NEURONTIN Take 2 capsules (600 mg total) by mouth 2 (two) times daily. What changed: when to take this   insulin aspart 100 UNIT/ML FlexPen Commonly known as: NOVOLOG Inject 20 Units into the skin 3 (three) times daily with meals. Replaces: insulin aspart 100 UNIT/ML injection   insulin glargine 100 UNIT/ML Solostar Pen Commonly known as: LANTUS 50 units in the morning, 30 units at bedtime Replaces: insulin glargine 100 UNIT/ML injection   Insulin Pen Needle 31G X 5 MM Misc 1 each by Does not apply route 4 (four) times daily.   losartan 50 MG tablet Commonly known as: COZAAR Take 1 tablet (50 mg total) by mouth daily. What changed: how much to take   metFORMIN 1000 MG tablet Commonly known as: GLUCOPHAGE Take 1 tablet (1,000 mg total) by mouth 2 (two) times daily with a meal.   multivitamin with minerals Tabs tablet Take 1 tablet by mouth daily.       Follow-up Information    Mechele Claude, FNP. Go on 01/26/2021.   Specialty: Family Medicine Why: [facility]. Appt is at 11:00. Please bring your photo ID and insurance card. Contact information: [address] [place] [address] [phone]              Allergies  Allergen Reactions  .  Antihistamines, Chlorpheniramine-Type Anaphylaxis      Procedures/Studies: CT Head Wo Contrast  Result Date: 01/05/2021 CLINICAL DATA:  Mental status change. EXAM: CT HEAD WITHOUT CONTRAST TECHNIQUE: Contiguous axial images were obtained from the base of the skull through the vertex without intravenous contrast. COMPARISON:  Remote head CT 04/29/2008 FINDINGS: Brain: Mild but age advanced cerebral and cerebellar atrophy. No intracranial hemorrhage, mass effect, or midline shift. No hydrocephalus. The basilar cisterns are patent. No evidence of territorial infarct or acute ischemia. No extra-axial or intracranial fluid collection. Vascular: No hyperdense vessel. Skull: No fracture or focal lesion. Sinuses/Orbits: Paranasal sinuses and mastoid air cells are clear. The visualized orbits are unremarkable. There is mild soft tissue thickening of the midline frontal scalp that may be related to scarring. Other: None. IMPRESSION: 1. No acute intracranial abnormality. 2. Mild but age advanced cerebral and cerebellar atrophy. Electronically Signed   By: Keith Rake M.D.   On: 01/05/2021 20:42   CT Cervical Spine Wo Contrast  Result Date: 01/05/2021 CLINICAL DATA:  Neck trauma, intoxicated or obtunded (Age >= 16y) EXAM: CT CERVICAL SPINE WITHOUT CONTRAST TECHNIQUE: Multidetector CT imaging of the cervical spine was  performed without intravenous contrast. Multiplanar CT image reconstructions were also generated. COMPARISON:  Remote CT 04/29/2008 FINDINGS: Alignment: Normal. Skull base and vertebrae: Fracture through C6 spinous process has well corticated margins and is chronic, suggestive of nonunion. This is new from 2009. No acute fracture. The dens and skull base are intact Soft tissues and spinal canal: No prevertebral fluid or swelling. No visible canal hematoma. Disc levels: Endplate spurring at multiple levels. Mild C4-C5 and C5-C6 disc space narrowing. Upper chest: No acute or unexpected findings.  Other: None. IMPRESSION: 1. No acute fracture or subluxation of the cervical spine. 2. 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 Electronically Signed   By: Keith Rake M.D.   On: 01/05/2021 20:45   CT Abdomen Pelvis W Contrast  Result Date: 01/05/2021 CLINICAL DATA:  Abdominal abscess/infection suspected. Patient with critical lab result. EXAM: CT ABDOMEN AND PELVIS WITH CONTRAST TECHNIQUE: Multidetector CT imaging of the abdomen and pelvis was performed using the standard protocol following bolus administration of intravenous contrast. CONTRAST:  67mL OMNIPAQUE IOHEXOL 300 MG/ML  SOLN COMPARISON:  October 17, 2012 FINDINGS: Lower chest: No acute abnormality. Hepatobiliary: No focal liver abnormality is seen. No gallstones, gallbladder wall thickening, or biliary dilatation. Pancreas: Unremarkable. No pancreatic ductal dilatation or surrounding inflammatory changes. Spleen: Normal in size without focal abnormality. Adrenals/Urinary Tract: Adrenal glands are unremarkable. Kidneys are normal, without renal calculi, focal lesion, or hydronephrosis. Bladder is unremarkable. Stomach/Bowel: Stomach is within normal limits. Appendix appears normal. No evidence of bowel wall thickening, distention, or inflammatory changes. Diffuse colonic diverticulosis without evidence of diverticulitis. Vascular/Lymphatic: No wrist shotty retroperitoneal lymph nodes, nonspecific. Mild calcific atherosclerotic disease of the aorta. Reproductive: Prostate is unremarkable. Other: No abdominal wall hernia or abnormality. No abdominopelvic ascites. Musculoskeletal: No acute or significant osseous findings. IMPRESSION: 1. No evidence of acute abnormalities within the abdomen or pelvis. 2. Diffuse colonic diverticulosis without evidence of diverticulitis. 3. Aortic atherosclerosis. Aortic Atherosclerosis (ICD10-I70.0). Electronically Signed   By: Fidela Salisbury M.D.   On: 01/05/2021 22:21   DG Chest  Portable 1 View  Result Date: 01/05/2021 CLINICAL DATA:  Fever EXAM: PORTABLE CHEST 1 VIEW COMPARISON:  06/13/2011 FINDINGS: Possible right upper lobe atelectasis. No pleural effusion. Normal heart size. No pneumothorax IMPRESSION: Possible right upper lobe atelectasis. Suggest short radiographic two-view chest follow-up. Electronically Signed   By: Donavan Foil M.D.   On: 01/05/2021 20:18      Discharge Exam: Vitals:   01/16/21 0538 01/16/21 0800  BP: 112/82 112/88  Pulse: 98 98  Resp: 16 18  Temp: 97.7 F (36.5 C) (!) 97.4 F (36.3 C)  SpO2: 99% 99%    General: Pt is alert, awake, not in acute distress Cardiovascular: RRR, S1/S2 +, no edema Respiratory: CTA bilaterally, no wheezing, no rhonchi, no respiratory distress, no conversational dyspnea  Abdominal: Soft, NT, ND, bowel sounds + Extremities: no edema, no cyanosis Psych: Normal mood and affect, overall poor judgement and insight     The results of significant diagnostics from this hospitalization (including imaging, microbiology, ancillary and laboratory) are listed below for reference.     Microbiology: No results found for this or any previous visit (from the past 240 hour(s)).   Labs: BNP (last 3 results) No results for input(s): BNP in the last 8760 hours. Basic Metabolic Panel: Recent Labs  Lab 01/10/21 0332 01/11/21 0350 01/12/21 0324 01/13/21 2126 01/14/21 0546 01/16/21 0358  NA 139 138 135 125* 136 134*  K 4.2 4.0 4.2  5.5* 4.1 4.0  CL 105 105 101 91* 104 102  CO2 _0 GLUCOSE 320* 211* 280* 705*  720* 236* 312*  BUN 22* 18 21* 24* 24* 39*  CREATININE 1.18 1.08 1.11 1.33* 1.05 1.15  CALCIUM 7.9* 8.4* 8.5* 8.9 8.8* 9.2  MG 1.8 1.9  --   --   --   --   PHOS 2.7 2.6  --   --   --   --    Liver Function Tests: No results for input(s): AST, ALT, ALKPHOS, BILITOT, PROT, ALBUMIN in the last 168 hours. No results for input(s): LIPASE, AMYLASE in the last 168 hours. No results for  input(s): AMMONIA in the last 168 hours. CBC: Recent Labs  Lab 01/10/21 0332 01/14/21 0546 01/16/21 0358  WBC 12.5* 13.1* 10.3  NEUTROABS 8.7*  --   --   HGB 10.9* 10.7* 10.7*  HCT 34.0* 30.9* 31.0*  MCV 109.3* 100.7* 102.3*  PLT 156 182 185   Cardiac Enzymes: No results for input(s): CKTOTAL, CKMB, CKMBINDEX, TROPONINI in the last 168 hours. BNP: Invalid input(s): POCBNP CBG: Recent Labs  Lab 01/15/21 1146 01/15/21 1654 01/15/21 2100 01/15/21 2309 01/16/21 0747  GLUCAP 211* 151* 313* 302* 300*   D-Dimer No results for input(s): DDIMER in the last 72 hours. Hgb A1c No results for input(s): HGBA1C in the last 72 hours. Lipid Profile No results for input(s): CHOL, HDL, LDLCALC, TRIG, CHOLHDL, LDLDIRECT in the last 72 hours. Thyroid function studies No results for input(s): TSH, T4TOTAL, T3FREE, THYROIDAB in the last 72 hours.  Invalid input(s): FREET3 Anemia work up No results for input(s): VITAMINB12, FOLATE, FERRITIN, TIBC, IRON, RETICCTPCT in the last 72 hours. Urinalysis    Component Value Date/Time   COLORURINE STRAW (A) 01/05/2021 2110   APPEARANCEUR CLEAR (A) 01/05/2021 2110   APPEARANCEUR Clear 08/18/2014 1318   LABSPEC 1.017 01/05/2021 2110   LABSPEC 1.024 08/18/2014 1318   PHURINE 6.0 01/05/2021 2110   GLUCOSEU >=500 (A) 01/05/2021 2110   GLUCOSEU Negative 08/18/2014 1318   HGBUR MODERATE (A) 01/05/2021 2110   BILIRUBINUR NEGATIVE 01/05/2021 2110   BILIRUBINUR Negative 08/18/2014 1318   KETONESUR 5 (A) 01/05/2021 2110   PROTEINUR NEGATIVE 01/05/2021 2110   NITRITE NEGATIVE 01/05/2021 2110   LEUKOCYTESUR TRACE (A) 01/05/2021 2110   LEUKOCYTESUR Negative 08/18/2014 1318   Sepsis Labs Invalid input(s): PROCALCITONIN,  WBC,  LACTICIDVEN Microbiology No results found for this or any previous visit (from the past 240 hour(s)).   Patient was seen and examined on the day of discharge and was found to be in stable condition. Time coordinating  discharge: 35 minutes including assessment and coordination of care, as well as examination of the patient.   SIGNED:  Dessa Phi, DO [facility] 01/16/2021, 8:38 AM

## 2021-01-16 NOTE — Progress Notes (Signed)
Called patient's father to discuss discharge. No answer and no option to leave message.   Madie Reno, RN

## 2021-01-16 NOTE — Progress Notes (Signed)
Spoke with patient's mother on phone. Per mother patient had medications locked up in pharmacy. Relayed to mother that per pharmacy patient can come in and retrieve medications at their convienence.  Madie Reno, RN

## 2021-01-17 LAB — GLUCOSE, CAPILLARY: Glucose-Capillary: >600 mg/dL (ref 70–99)

## 2021-04-20 ENCOUNTER — Ambulatory Visit: Payer: BLUE CROSS/BLUE SHIELD | Admitting: *Deleted

## 2021-08-25 ENCOUNTER — Inpatient Hospital Stay
Admission: EM | Admit: 2021-08-25 | Discharge: 2021-08-27 | DRG: 871 | Discharge status: Against Medical Advice | Payer: BLUE CROSS/BLUE SHIELD | Attending: Internal Medicine | Admitting: Internal Medicine

## 2021-08-25 ENCOUNTER — Other Ambulatory Visit: Payer: Self-pay

## 2021-08-25 ENCOUNTER — Emergency Department: Payer: BLUE CROSS/BLUE SHIELD

## 2021-08-25 ENCOUNTER — Inpatient Hospital Stay: Payer: BLUE CROSS/BLUE SHIELD

## 2021-08-25 ENCOUNTER — Encounter: Payer: Self-pay | Admitting: Internal Medicine

## 2021-08-25 DIAGNOSIS — F319 Bipolar disorder, unspecified: Secondary | ICD-10-CM | POA: Diagnosis present

## 2021-08-25 DIAGNOSIS — Z20822 Contact with and (suspected) exposure to covid-19: Secondary | ICD-10-CM | POA: Diagnosis present

## 2021-08-25 DIAGNOSIS — F1090 Alcohol use, unspecified, uncomplicated: Secondary | ICD-10-CM | POA: Diagnosis present

## 2021-08-25 DIAGNOSIS — F10239 Alcohol dependence with withdrawal, unspecified: Secondary | ICD-10-CM | POA: Diagnosis present

## 2021-08-25 DIAGNOSIS — E1310 Other specified diabetes mellitus with ketoacidosis without coma: Secondary | ICD-10-CM

## 2021-08-25 DIAGNOSIS — A419 Sepsis, unspecified organism: Principal | ICD-10-CM | POA: Diagnosis present

## 2021-08-25 DIAGNOSIS — R778 Other specified abnormalities of plasma proteins: Secondary | ICD-10-CM | POA: Diagnosis present

## 2021-08-25 DIAGNOSIS — R7989 Other specified abnormal findings of blood chemistry: Secondary | ICD-10-CM | POA: Diagnosis present

## 2021-08-25 DIAGNOSIS — Z7984 Long term (current) use of oral hypoglycemic drugs: Secondary | ICD-10-CM

## 2021-08-25 DIAGNOSIS — F419 Anxiety disorder, unspecified: Secondary | ICD-10-CM

## 2021-08-25 DIAGNOSIS — F1010 Alcohol abuse, uncomplicated: Secondary | ICD-10-CM | POA: Diagnosis not present

## 2021-08-25 DIAGNOSIS — R4182 Altered mental status, unspecified: Secondary | ICD-10-CM

## 2021-08-25 DIAGNOSIS — I10 Essential (primary) hypertension: Secondary | ICD-10-CM | POA: Diagnosis present

## 2021-08-25 DIAGNOSIS — R6520 Severe sepsis without septic shock: Secondary | ICD-10-CM | POA: Diagnosis present

## 2021-08-25 DIAGNOSIS — F1910 Other psychoactive substance abuse, uncomplicated: Secondary | ICD-10-CM

## 2021-08-25 DIAGNOSIS — F314 Bipolar disorder, current episode depressed, severe, without psychotic features: Secondary | ICD-10-CM | POA: Diagnosis not present

## 2021-08-25 DIAGNOSIS — N390 Urinary tract infection, site not specified: Secondary | ICD-10-CM | POA: Diagnosis present

## 2021-08-25 DIAGNOSIS — M879 Osteonecrosis, unspecified: Secondary | ICD-10-CM | POA: Diagnosis present

## 2021-08-25 DIAGNOSIS — Z8639 Personal history of other endocrine, nutritional and metabolic disease: Secondary | ICD-10-CM

## 2021-08-25 DIAGNOSIS — I248 Other forms of acute ischemic heart disease: Secondary | ICD-10-CM | POA: Diagnosis present

## 2021-08-25 DIAGNOSIS — N179 Acute kidney failure, unspecified: Secondary | ICD-10-CM | POA: Diagnosis present

## 2021-08-25 DIAGNOSIS — L989 Disorder of the skin and subcutaneous tissue, unspecified: Secondary | ICD-10-CM | POA: Diagnosis present

## 2021-08-25 DIAGNOSIS — Z794 Long term (current) use of insulin: Secondary | ICD-10-CM

## 2021-08-25 DIAGNOSIS — F1410 Cocaine abuse, uncomplicated: Secondary | ICD-10-CM | POA: Diagnosis present

## 2021-08-25 DIAGNOSIS — J690 Pneumonitis due to inhalation of food and vomit: Secondary | ICD-10-CM | POA: Diagnosis present

## 2021-08-25 DIAGNOSIS — R569 Unspecified convulsions: Secondary | ICD-10-CM | POA: Diagnosis present

## 2021-08-25 DIAGNOSIS — Z87898 Personal history of other specified conditions: Secondary | ICD-10-CM

## 2021-08-25 DIAGNOSIS — M6282 Rhabdomyolysis: Secondary | ICD-10-CM | POA: Diagnosis present

## 2021-08-25 DIAGNOSIS — E871 Hypo-osmolality and hyponatremia: Secondary | ICD-10-CM | POA: Diagnosis present

## 2021-08-25 DIAGNOSIS — Z811 Family history of alcohol abuse and dependence: Secondary | ICD-10-CM

## 2021-08-25 DIAGNOSIS — E1110 Type 2 diabetes mellitus with ketoacidosis without coma: Secondary | ICD-10-CM | POA: Diagnosis present

## 2021-08-25 DIAGNOSIS — B359 Dermatophytosis, unspecified: Secondary | ICD-10-CM | POA: Diagnosis present

## 2021-08-25 DIAGNOSIS — Z9114 Patient's other noncompliance with medication regimen: Secondary | ICD-10-CM

## 2021-08-25 DIAGNOSIS — G9340 Encephalopathy, unspecified: Secondary | ICD-10-CM | POA: Diagnosis present

## 2021-08-25 DIAGNOSIS — Z6372 Alcoholism and drug addiction in family: Secondary | ICD-10-CM

## 2021-08-25 DIAGNOSIS — Z888 Allergy status to other drugs, medicaments and biological substances status: Secondary | ICD-10-CM

## 2021-08-25 DIAGNOSIS — Z79899 Other long term (current) drug therapy: Secondary | ICD-10-CM

## 2021-08-25 DIAGNOSIS — Z91148 Patient's other noncompliance with medication regimen for other reason: Secondary | ICD-10-CM

## 2021-08-25 LAB — RESP PANEL BY RT-PCR (FLU A&B, COVID) ARPGX2
Influenza A by PCR: NEGATIVE
Influenza B by PCR: NEGATIVE
SARS Coronavirus 2 by RT PCR: NEGATIVE

## 2021-08-25 LAB — URINALYSIS, COMPLETE (UACMP) WITH MICROSCOPIC
Bilirubin Urine: NEGATIVE
Glucose, UA: >=500 mg/dL — AB
Ketones, ur: 20 mg/dL — AB
Leukocytes,Ua: NEGATIVE
Nitrite: NEGATIVE
Protein, ur: 100 mg/dL — AB
Specific Gravity, Urine: 1.018 (ref 1.005–1.030)
pH: 5 (ref 5.0–8.0)

## 2021-08-25 LAB — BASIC METABOLIC PANEL
Anion gap: 20 — ABNORMAL HIGH (ref 5–15)
Anion gap: 16 — ABNORMAL HIGH (ref 5–15)
BUN: 71 mg/dL — ABNORMAL HIGH (ref 6–20)
BUN: 69 mg/dL — ABNORMAL HIGH (ref 6–20)
CO2: 18 mmol/L — ABNORMAL LOW (ref 22–32)
CO2: 20 mmol/L — ABNORMAL LOW (ref 22–32)
Calcium: 10.4 mg/dL — ABNORMAL HIGH (ref 8.9–10.3)
Calcium: 10.2 mg/dL (ref 8.9–10.3)
Chloride: 95 mmol/L — ABNORMAL LOW (ref 98–111)
Chloride: 99 mmol/L (ref 98–111)
Creatinine, Ser: 2.88 mg/dL — ABNORMAL HIGH (ref 0.61–1.24)
Creatinine, Ser: 2.65 mg/dL — ABNORMAL HIGH (ref 0.61–1.24)
GFR, Estimated: 26 mL/min — ABNORMAL LOW (ref >60)
GFR, Estimated: 28 mL/min — ABNORMAL LOW (ref >60)
Glucose, Bld: 538 mg/dL (ref 70–99)
Glucose, Bld: 367 mg/dL — ABNORMAL HIGH (ref 70–99)
Potassium: 5.6 mmol/L — ABNORMAL HIGH (ref 3.5–5.1)
Potassium: 4.2 mmol/L (ref 3.5–5.1)
Sodium: 133 mmol/L — ABNORMAL LOW (ref 135–145)
Sodium: 135 mmol/L (ref 135–145)

## 2021-08-25 LAB — CBC
HCT: 45.3 % (ref 39.0–52.0)
Hemoglobin: 16.1 g/dL (ref 13.0–17.0)
MCH: 36.7 pg — ABNORMAL HIGH (ref 26.0–34.0)
MCHC: 35.5 g/dL (ref 30.0–36.0)
MCV: 103.2 fL — ABNORMAL HIGH (ref 80.0–100.0)
Platelets: 191 10*3/uL (ref 150–400)
RBC: 4.39 MIL/uL (ref 4.22–5.81)
RDW: 13.2 % (ref 11.5–15.5)
WBC: 16.5 10*3/uL — ABNORMAL HIGH (ref 4.0–10.5)
nRBC: 0 % (ref 0.0–0.2)

## 2021-08-25 LAB — COMPREHENSIVE METABOLIC PANEL
ALT: 50 U/L — ABNORMAL HIGH (ref 0–44)
AST: 79 U/L — ABNORMAL HIGH (ref 15–41)
Albumin: 4.9 g/dL (ref 3.5–5.0)
Alkaline Phosphatase: 92 U/L (ref 38–126)
Anion gap: 23 — ABNORMAL HIGH (ref 5–15)
BUN: 67 mg/dL — ABNORMAL HIGH (ref 6–20)
CO2: 18 mmol/L — ABNORMAL LOW (ref 22–32)
Calcium: 11.6 mg/dL — ABNORMAL HIGH (ref 8.9–10.3)
Chloride: 89 mmol/L — ABNORMAL LOW (ref 98–111)
Creatinine, Ser: 3.02 mg/dL — ABNORMAL HIGH (ref 0.61–1.24)
GFR, Estimated: 24 mL/min — ABNORMAL LOW (ref >60)
Glucose, Bld: 301 mg/dL — ABNORMAL HIGH (ref 70–99)
Potassium: 5.2 mmol/L — ABNORMAL HIGH (ref 3.5–5.1)
Sodium: 130 mmol/L — ABNORMAL LOW (ref 135–145)
Total Bilirubin: 2.2 mg/dL — ABNORMAL HIGH (ref 0.3–1.2)
Total Protein: 9.9 g/dL — ABNORMAL HIGH (ref 6.5–8.1)

## 2021-08-25 LAB — PHOSPHORUS: Phosphorus: 3.9 mg/dL (ref 2.5–4.6)

## 2021-08-25 LAB — URINE DRUG SCREEN, QUALITATIVE (ARMC ONLY)
Amphetamines, Ur Screen: NOT DETECTED
Barbiturates, Ur Screen: NOT DETECTED
Benzodiazepine, Ur Scrn: NOT DETECTED
Cannabinoid 50 Ng, Ur ~~LOC~~: NOT DETECTED
Cocaine Metabolite,Ur ~~LOC~~: POSITIVE — AB
MDMA (Ecstasy)Ur Screen: NOT DETECTED
Methadone Scn, Ur: NOT DETECTED
Opiate, Ur Screen: NOT DETECTED
Phencyclidine (PCP) Ur S: NOT DETECTED
Tricyclic, Ur Screen: NOT DETECTED

## 2021-08-25 LAB — CBG MONITORING, ED
Glucose-Capillary: 558 mg/dL (ref 70–99)
Glucose-Capillary: 468 mg/dL — ABNORMAL HIGH (ref 70–99)
Glucose-Capillary: 409 mg/dL — ABNORMAL HIGH (ref 70–99)
Glucose-Capillary: 465 mg/dL — ABNORMAL HIGH (ref 70–99)
Glucose-Capillary: 261 mg/dL — ABNORMAL HIGH (ref 70–99)
Glucose-Capillary: 199 mg/dL — ABNORMAL HIGH (ref 70–99)
Glucose-Capillary: 257 mg/dL — ABNORMAL HIGH (ref 70–99)

## 2021-08-25 LAB — ETHANOL: Alcohol, Ethyl (B): <10 mg/dL (ref <10)

## 2021-08-25 LAB — CK: Total CK: 3944 U/L — ABNORMAL HIGH (ref 49–397)

## 2021-08-25 LAB — TROPONIN I (HIGH SENSITIVITY)
Troponin I (High Sensitivity): 28 ng/L — ABNORMAL HIGH (ref <18)
Troponin I (High Sensitivity): 41 ng/L — ABNORMAL HIGH (ref <18)

## 2021-08-25 LAB — LACTIC ACID, PLASMA
Lactic Acid, Venous: 1.9 mmol/L (ref 0.5–1.9)
Lactic Acid, Venous: 1.9 mmol/L (ref 0.5–1.9)

## 2021-08-25 LAB — MAGNESIUM: Magnesium: 2.2 mg/dL (ref 1.7–2.4)

## 2021-08-25 LAB — PROCALCITONIN: Procalcitonin: 0.62 ng/mL

## 2021-08-25 MED ORDER — ACETAMINOPHEN 325 MG RE SUPP
650.0000 mg | Freq: Four times a day (QID) | RECTAL | Status: DC | PRN
  Administered 2021-08-25: 650 mg via RECTAL
  Filled 2021-08-25: qty 2

## 2021-08-25 MED ORDER — DEXTROSE 50 % IV SOLN: 0.0000 mL | INTRAVENOUS | Status: DC | PRN

## 2021-08-25 MED ORDER — METRONIDAZOLE 500 MG/100ML IV SOLN
500.0000 mg | Freq: Once | INTRAVENOUS | Status: AC
  Administered 2021-08-25: 500 mg via INTRAVENOUS
  Filled 2021-08-25: qty 100

## 2021-08-25 MED ORDER — LACTATED RINGERS IV BOLUS: Freq: Once | INTRAVENOUS | Status: AC

## 2021-08-25 MED ORDER — ACETAMINOPHEN 325 MG PO TABS
650.0000 mg | ORAL_TABLET | Freq: Four times a day (QID) | ORAL | Status: DC | PRN
  Filled 2021-08-25: qty 2

## 2021-08-25 MED ORDER — FOLIC ACID 1 MG PO TABS
1.0000 mg | ORAL_TABLET | Freq: Every day | ORAL | Status: DC
  Administered 2021-08-27: 1 mg via ORAL
  Filled 2021-08-25: qty 1

## 2021-08-25 MED ORDER — VANCOMYCIN HCL 750 MG/150ML IV SOLN
750.0000 mg | Freq: Once | INTRAVENOUS | Status: AC
  Administered 2021-08-25: 750 mg via INTRAVENOUS
  Filled 2021-08-25: qty 150

## 2021-08-25 MED ORDER — SODIUM CHLORIDE 0.9 % IV SOLN
2.0000 g | Freq: Two times a day (BID) | INTRAVENOUS | Status: DC
  Administered 2021-08-26 – 2021-08-27 (×3): 2 g via INTRAVENOUS
  Filled 2021-08-25 (×4): qty 2

## 2021-08-25 MED ORDER — INSULIN REGULAR(HUMAN) IN NACL 100-0.9 UT/100ML-% IV SOLN
INTRAVENOUS | Status: DC
  Administered 2021-08-25: 4.8 [IU]/h via INTRAVENOUS
  Administered 2021-08-26: 2.4 [IU]/h via INTRAVENOUS
  Administered 2021-08-26: 1.5 [IU]/h via INTRAVENOUS
  Filled 2021-08-25: qty 100

## 2021-08-25 MED ORDER — SODIUM CHLORIDE 0.9 % IV SOLN
500.0000 mg | INTRAVENOUS | Status: DC
  Administered 2021-08-25 – 2021-08-26 (×2): 500 mg via INTRAVENOUS
  Filled 2021-08-25 (×3): qty 500

## 2021-08-25 MED ORDER — LABETALOL HCL 5 MG/ML IV SOLN
10.0000 mg | INTRAVENOUS | Status: DC | PRN
  Administered 2021-08-26: 10 mg via INTRAVENOUS
  Filled 2021-08-25: qty 4

## 2021-08-25 MED ORDER — VANCOMYCIN HCL IN DEXTROSE 1-5 GM/200ML-% IV SOLN
1000.0000 mg | Freq: Once | INTRAVENOUS | Status: AC
  Administered 2021-08-25: 1000 mg via INTRAVENOUS
  Filled 2021-08-25: qty 200

## 2021-08-25 MED ORDER — LABETALOL HCL 5 MG/ML IV SOLN
10.0000 mg | INTRAVENOUS | Status: DC | PRN
  Administered 2021-08-25: 10 mg via INTRAVENOUS
  Filled 2021-08-25: qty 4

## 2021-08-25 MED ORDER — LACTATED RINGERS IV SOLN: INTRAVENOUS | Status: DC

## 2021-08-25 MED ORDER — HEPARIN SODIUM (PORCINE) 5000 UNIT/ML IJ SOLN
5000.0000 [IU] | Freq: Three times a day (TID) | INTRAMUSCULAR | Status: DC
  Administered 2021-08-25 – 2021-08-27 (×5): 5000 [IU] via SUBCUTANEOUS
  Filled 2021-08-25 (×5): qty 1

## 2021-08-25 MED ORDER — LORAZEPAM 2 MG/ML IJ SOLN
1.0000 mg | INTRAMUSCULAR | Status: DC | PRN
  Administered 2021-08-26: 3 mg via INTRAVENOUS
  Administered 2021-08-26: 1 mg via INTRAVENOUS
  Filled 2021-08-25: qty 1
  Filled 2021-08-25: qty 2

## 2021-08-25 MED ORDER — LORAZEPAM 1 MG PO TABS
1.0000 mg | ORAL_TABLET | ORAL | Status: DC | PRN
  Administered 2021-08-27: 1 mg via ORAL
  Filled 2021-08-25: qty 1

## 2021-08-25 MED ORDER — SODIUM CHLORIDE 0.9 % IV SOLN
2.0000 g | Freq: Once | INTRAVENOUS | Status: AC
  Administered 2021-08-25: 2 g via INTRAVENOUS
  Filled 2021-08-25: qty 2

## 2021-08-25 MED ORDER — SODIUM CHLORIDE 0.9 % IV BOLUS
1000.0000 mL | Freq: Once | INTRAVENOUS | Status: AC
  Administered 2021-08-25: 1000 mL via INTRAVENOUS

## 2021-08-25 MED ORDER — ADULT MULTIVITAMIN W/MINERALS CH
1.0000 | ORAL_TABLET | Freq: Every day | ORAL | Status: DC
  Administered 2021-08-27: 1 via ORAL
  Filled 2021-08-25: qty 1

## 2021-08-25 MED ORDER — VANCOMYCIN VARIABLE DOSE PER UNSTABLE RENAL FUNCTION (PHARMACIST DOSING): Status: DC

## 2021-08-25 MED ORDER — THIAMINE HCL 100 MG/ML IJ SOLN
100.0000 mg | Freq: Every day | INTRAMUSCULAR | Status: DC
  Administered 2021-08-25 – 2021-08-26 (×2): 100 mg via INTRAVENOUS
  Filled 2021-08-25 (×2): qty 2

## 2021-08-25 MED ORDER — LORAZEPAM 2 MG/ML IJ SOLN: 2.0000 mg | INTRAMUSCULAR | Status: DC | PRN

## 2021-08-25 MED ORDER — THIAMINE HCL 100 MG PO TABS
100.0000 mg | ORAL_TABLET | Freq: Every day | ORAL | Status: DC
  Administered 2021-08-27: 100 mg via ORAL
  Filled 2021-08-25: qty 1

## 2021-08-25 MED ORDER — DEXTROSE IN LACTATED RINGERS 5 % IV SOLN: INTRAVENOUS | Status: DC

## 2021-08-25 MED ORDER — LABETALOL HCL 5 MG/ML IV SOLN: 10.0000 mg | INTRAVENOUS | Status: DC | PRN

## 2021-08-25 NOTE — Progress Notes (Addendum)
Pharmacy Antibiotic Note  Andrew Buchanan is a 50 y.o. male admitted on 08/25/2021 with sepsis and acute renal failure.   Pharmacy has been consulted for Vancomycin and Cefepime dosing.  Plan: Vancomycin: Patient received vancomycin 1g IV in ED. Will order an additional vancomycin 750mg  x 1 dose now. Due to unstable renal function will plan to follow up on renal function in the morning and order next dose.   Cefepime: Will start cefepime 2g IV every 12 hours for CrCl 30-64ml/min   Pharmacy will monitor renal function and adjust dose as needed.   Height: 6' (182.9 cm) Weight: 85.9 kg (189 lb 6 oz) IBW/kg (Calculated) : 77.6  Temp (24hrs), Avg:99.4 F (37.4 C), Min:99.4 F (37.4 C), Max:99.4 F (37.4 C)  Recent Labs  Lab 08/25/21 1524 08/25/21 1830 08/25/21 1925  WBC 16.5*  --   --   CREATININE 3.02* 2.88*  --   LATICACIDVEN  --   --  1.9    Estimated Creatinine Clearance: 33.7 mL/min (A) (by C-G formula based on SCr of 2.88 mg/dL (H)).    Allergies  Allergen Reactions   Antihistamines, Chlorpheniramine-Type Anaphylaxis    Antimicrobials this admission: 11/18 vancomycin >> 11/18 Cefepime >>  Microbiology results: 11/18 BCx: pending 11/18 UCx: pending   Thank you for allowing pharmacy to be a part of this patient's care.  12/18, PharmD, BCPS Clinical Pharmacist 08/25/2021 8:16 PM

## 2021-08-25 NOTE — Progress Notes (Signed)
PHARMACY -  BRIEF ANTIBIOTIC NOTE   Pharmacy has received consult(s) for Vancomycin and Cefepime from an ED provider.  The patient's profile has been reviewed for ht/wt/allergies/indication/available labs.    One time order(s) placed for Cefepime 2g and vancomycin 1000mg    No height or weight entered into Epic at this time. RN contacted @ [phone].   Further antibiotics/pharmacy consults should be ordered by admitting physician if indicated.                       Thank you, [phone], PharmD, BCPS Clinical Pharmacist 08/25/2021 6:28 PM

## 2021-08-25 NOTE — Sepsis Progress Note (Signed)
Requested order for LA from attending

## 2021-08-25 NOTE — ED Provider Notes (Signed)
[facility] Emergency Department Provider Note  Time seen: 3:59 PM  I have reviewed the triage vital signs and the nursing notes.   HISTORY  Chief Complaint Altered Mental Status   HPI Andrew Buchanan is a 50 y.o. male with a past medical history of alcohol use, anxiety, diabetes, hypertension, presents to the emergency department after being found down at his home.  According to EMS patient was found facedown upstairs in the bedroom, will last seen normal last night around 7 PM.  Here the patient appears altered, is confused, slurring his speech unable to follow commands.  Attempts to answer questions but largely unintelligible speech and appears quite somnolent.  Unable to add much to the history or review of systems in his current state.   Past Medical History:  Diagnosis Date   Alcohol abuse    Anxiety    Diabetes mellitus without complication (HCC)    Hypertension    Pancreatitis     Patient Active Problem List   Diagnosis Date Noted   AKI (acute kidney injury) (HCC) 01/08/2021   Malnutrition of moderate degree 01/07/2021   Sepsis with acute renal failure without septic shock (HCC)    Hyperosmolar hyperglycemic state (HHS) (HCC) 01/05/2021   DKA (diabetic ketoacidoses) 05/19/2020   Noncompliance with medications 05/19/2020   Alcohol intoxication delirium (HCC) 11/26/2018   Alcohol abuse 11/26/2018   Bipolar disorder, unspecified (HCC) 11/26/2018    No past surgical history on file.  Prior to Admission medications   Medication Sig Start Date End Date Taking? Authorizing Provider  blood glucose meter kit and supplies KIT Dispense based on patient and insurance preference. Use up to four times daily as directed. 01/16/21   Noralee Stain, DO  gabapentin (NEURONTIN) 300 MG capsule Take 2 capsules (600 mg total) by mouth 2 (two) times daily. Patient taking differently: Take 600 mg by mouth 3 (three) times daily. 05/20/20   Enedina Finner, MD   insulin aspart (NOVOLOG) 100 UNIT/ML FlexPen Inject 20 Units into the skin 3 (three) times daily with meals. 01/16/21 04/01/21  Noralee Stain, DO  insulin glargine (LANTUS) 100 UNIT/ML Solostar Pen 50 units in the morning, 30 units at bedtime 01/16/21   Noralee Stain, DO  Insulin Pen Needle 31G X 5 MM MISC 1 each by Does not apply route 4 (four) times daily. 01/16/21   Noralee Stain, DO  losartan (COZAAR) 50 MG tablet Take 1 tablet (50 mg total) by mouth daily. Patient taking differently: Take 100 mg by mouth daily. 05/20/20   Enedina Finner, MD  metFORMIN (GLUCOPHAGE) 1000 MG tablet Take 1 tablet (1,000 mg total) by mouth 2 (two) times daily with a meal. 05/20/20   Enedina Finner, MD  Multiple Vitamin (MULTIVITAMIN WITH MINERALS) TABS tablet Take 1 tablet by mouth daily. 05/21/20   Enedina Finner, MD    Allergies  Allergen Reactions   Antihistamines, Chlorpheniramine-Type Anaphylaxis    No family history on file.  Social History Social History   Tobacco Use   Smoking status: Never   Smokeless tobacco: Never  Substance Use Topics   Alcohol use: Yes    Review of Systems Unable to obtain adequate/accurate review of systems secondary to altered mental status ____________________________________________   PHYSICAL EXAM:  VITAL SIGNS: ED Triage Vitals [08/25/21 1530]  Enc Vitals Group     BP (!) 172/121     Pulse Rate (!) 119     Resp 17     Temp 99.4 F (37.4 C)  Temp Source Rectal     SpO2 98 %     Weight      Height      Head Circumference      Peak Flow      Pain Score      Pain Loc      Pain Edu?      Excl. in [place]?     Constitutional: Somnolent, but will attempt answer questions largely unintelligible, not following commands. Eyes: 2 mm, reactive bilaterally. ENT      Head: Normocephalic and atraumatic.      Mouth/Throat: Dry appearing mucous membranes with some dried blood around the lips. Cardiovascular: Regular rhythm rate around 120 bpm.  No obvious  murmur. Respiratory: Normal respiratory effort without tachypnea nor retractions. Breath sounds are clear, no obvious wheeze rales or rhonchi Gastrointestinal: Soft and nontender. No distention. Musculoskeletal: Nontender with normal range of motion Neurologic:  Normal speech and language. No gross focal neurologic deficits  Skin:  Skin is cool to the touch. Psychiatric: Mood and affect are normal.   ____________________________________________    EKG  EKG viewed and interpreted by myself shows sinus tachycardia 119 bpm with a narrow QRS, normal axis, normal intervals, nonspecific ST changes.  ____________________________________________    RADIOLOGY  ET scan head shows no acute abnormality. Chest x-ray shows atelectasis.  ____________________________________________   INITIAL IMPRESSION / ASSESSMENT AND PLAN / ED COURSE  Pertinent labs & imaging results that were available during my care of the patient were reviewed by me and considered in my medical decision making (see chart for details).   Patient presents to the emergency department with altered mental status being found down upstairs in his bedroom.  Patient does have dried blood around his lips.  Unable to give any additional history or review of systems.  Patient does have a history of alcohol use per record review.  We will check labs including alcohol level, patient found to be hyperglycemic by EMS we will check a VBG, chemistry, IV hydrate.  We will check COVID/flu, CT scan of the head given possible head injury and altered state.  CK given possible prolonged downtime.  Differential is quite broad at this time but includes infectious etiologies, metabolic abnormalities, DKA, CVA or ICH, rhabdomyolysis, substance use.  I spoke to the patient's mother over the phone.  She states the patient normally is able to converse normally.  States the patient does have a history of alcohol abuse as well as substance use including crack  cocaine, Klonopin and Percocet.  Patient's labs have begun to result showing a moderate leukocytosis of 16,000, appears to be in rhabdomyolysis with a CK of 3900, chemistry is pending.  COVID/flu pending.  CT scan of the head and chest x-ray largely nonrevealing.  Patient receiving IV fluids, we will dose a second liter of fluids while awaiting further results.  Anticipate admission to the hospital once his results are known.  Chemistry has resulted showing acute renal failure, concern for DKA given anion gap of 23.  Given the elevated white blood cell count borderline fever tachycardia and tachypnea we will cover with antibiotics as well for possible sepsis.  We will cover with IV insulin infusion for possible DKA.  Patient will be admitted to the hospital service for further work-up and treatment  Marquin Briana Newman was evaluated in Emergency Department on 08/25/2021 for the symptoms described in the history of present illness. He was evaluated in the context of the global COVID-19 pandemic, which necessitated  consideration that the patient might be at risk for infection with the SARS-CoV-2 virus that causes COVID-19. Institutional protocols and algorithms that pertain to the evaluation of patients at risk for COVID-19 are in a state of rapid change based on information released by regulatory bodies including the CDC and federal and state organizations. These policies and algorithms were followed during the patient's care in the ED.  CRITICAL CARE Performed by: Harvest Dark   Total critical care time: 30 minutes  Critical care time was exclusive of separately billable procedures and treating other patients.  Critical care was necessary to treat or prevent imminent or life-threatening deterioration.  Critical care was time spent personally by me on the following activities: development of treatment plan with patient and/or surrogate as well as nursing, discussions with consultants,  evaluation of patient's response to treatment, examination of patient, obtaining history from patient or surrogate, ordering and performing treatments and interventions, ordering and review of laboratory studies, ordering and review of radiographic studies, pulse oximetry and re-evaluation of patient's condition.   ____________________________________________   FINAL CLINICAL IMPRESSION(S) / ED DIAGNOSES  Altered mental status Rhabdomyolysis Acute renal failure Diabetic ketoacidosis    Harvest Dark, MD 08/25/21 [phone]

## 2021-08-25 NOTE — Progress Notes (Signed)
CODE SEPSIS - PHARMACY COMMUNICATION  **Broad Spectrum Antibiotics should be administered within 1 hour of Sepsis diagnosis**  Time Code Sepsis Called/Page Received: 1918  Antibiotics Ordered: Cefepime and Vancomycin   Time of 1st antibiotic administration: 1844  Additional action taken by pharmacy: Messaged RN at 1825 about needed height and weight in Epic so antibiotic doses could be verified.   If necessary, Name of Provider/Nurse Contacted: Georgann Housekeeper, RN   Gardner Candle, PharmD, BCPS Clinical Pharmacist 08/25/2021 6:29 PM

## 2021-08-25 NOTE — Sepsis Progress Note (Signed)
Sepsis protocol followed by eLink 

## 2021-08-25 NOTE — H&P (Addendum)
History and Physical   Tayler Lassen PTW:[id number] DOB: 12-06-1970 DOA: 08/25/2021  PCP: System, Provider Not In  Outpatient Specialists: Dr. Dot Lanes, [facility] urology Patient coming from: home via EMS  I have personally briefly reviewed patient's old medical records in [place].  Chief Concern: Altered mental status, possible syncopal event  HPI: Andrew Buchanan is a 50 y.o. male with medical history significant for insulin-dependent diabetes mellitus poorly controlled, polysubstance abuse, polypharmacy abuse, anxiety, alcohol abuse, history of urethrocutaneous fistula, follows with [facility] urology, who presents emergency department from home via EMS for chief concerns of being found down.  At bedside he is able to wake up and tell me his name and his age.  He knows he is in the hospital.  He will probably fall back to sleep.  He is arousable with sternal rub and with loud verbal stimuli. There is food products and or mucus surrounding his mouth.   Per mother over the phone, patient's father will not answer questions regarding the patient.  She reports that patient and his father used to drink together.  She reports that patient has a history of tremors when he doesn't have enough alcohol to drink. She further reports that she thinks he takes 10 mg oxycodone, clonazepam 1 mg.   Social history: He lives with his father. Mother denies tobacco use. Mother reports that he drinks approximately 1/5 Vodka per day, uses clonazepam, and pain medication.  Vaccination history: unknown  ROS: Unable to complete as patient is altered  ED Course: Discussed with emergency medicine provider, patient requiring hospitalization for chief concerns of multiple acute medical conditions including rhabdomyolysis, DKA.  Vitals in the emergency department was remarkable for temperature of 99.4, respiration rate of 17, initial heart rate of 119, blood pressure 172/121, SPO2 of 98% on room  air.  Labs in the emergency department was remarkable for serum sodium 130, potassium 5.2, chloride 89, bicarb 18, BUN of 69, serum creatinine of 3.02, nonfasting blood glucose 301, on Chem-7, GFR of 24, anion gap of 23, WBC 16.5, hemoglobin 16.1, platelets 191, CK 3944.  High sensitive troponin was 28.    COVID/influenza A/influenza B PCR were negative.  In the emergency department patient was covered broadly with cefepime, metronidazole, vancomycin for possible aspiration and sepsis.  Insulin GTT via Endo tool was initiated by ED provider.  Patient received sodium chloride a total of 2 L bolus and 1 lactated ringer bolus.  Patient was also started on lactated Ringer's infusion at 125 mL/h.  Assessment/Plan  Principal Problem:   Rhabdomyolysis Active Problems:   Alcohol abuse   Bipolar disorder, unspecified ([place])   DKA (diabetic ketoacidosis) ([place])   Noncompliance with medications   Sepsis with acute renal failure without septic shock (HCC)   AKI (acute kidney injury) ([place])   Polysubstance abuse ([place])   Anxiety   History of insulin dependent diabetes mellitus   Elevated troponin   Primary hypertension   # Meet sepsis criteria with elevated respiration rate, increased leukocytosis, fever, source of possible aspiration pneumonia - Meets severe sepsis criteria in setting of endocrine crisis and renal involvement - Etiology of possible sepsis criteria is in work-up at this time - Patient has multiple risk factors for severe sepsis secondary to infectious etiology including uncontrolled insulin-dependent diabetes mellitus, polypharmacy, polysubstance drug abuse including ethanol and crack cocaine - Blood cultures x2 have been ordered - UA with urine cultures have been ordered - Procalcitonin ordered - Continue broad-spectrum antibiotics with vancomycin  and cefepime per pharmacy - If procalcitonin is positive we will add azithromycin for atypical coverage - Procalcitonin was elevated,  added azithromycin for atypical coverage  # Diabetic ketoacidosis secondary to poorly controlled insulin-dependent diabetes mellitus # Metabolic acidosis with anion gap # Insulin-dependent diabetes mellitus # Hyperglycemia - Continue insulin GTT - Check stat: Magnesium, phosphorus, B1, B12 - Admit to stepdown, inpatient, telemetry - Treat per DKA protocol with BMP stat now, every 4 hours for 5 occurrences; check beta hydroxybutyrate every 8 hours  # Elevated HS troponin - presumed demand ischemia secondary to multifactorial including severe sepsis, rhadomyolysis - Continue to follow up with second HS troponin level  # Acute kidney injury-multifactorial, treat as above  # Rhabdomyolysis-multifactorial including possibility of dehydration, DKA, syncopal event - Continue with aggressive fluid hydration - Check CK in the a.m.  # Right lateral thigh (consistent with vastus lateralis) muscle contusion and swelling - POA - Image in media - CT of the right femur without contrast has been ordered, well contrast material would enable for better visualization of the lesion, patient has acute kidney injury therefore contrast CT was not ordered  # Primary hypertension-losartan 50 mg daily has not been resumed due to acute kidney injury - Labetalol 10 mg every 3 hours as needed for SBP greater than 170, 3 doses ordered - A.m. team to resume antihypertensive medications when appropriate  # Insulin-dependent diabetes mellitus-patient takes insulin glargine 50 units in the morning, 30 units nightly; insulin aspart 20 units into the skin 3 times daily with meals - Unknown if patient has missed any of his insulin and unknown regarding compliance - Suspect noncompliance given that patient's A1c was > 15 in April 2022 - Ultimate goal of blood glucose control while inpatient is 140-180 - Continue insulin GTT for now via Endo tool - Check A1c  # Syncope-CT the head without contrast was read as no acute  intracranial abnormalities - Etiology work-up in progress at this time - Check B12, B1  # History of alcohol abuse-ethanol level was negative - CIWA precaution initiated  # Hyponatremia secondary to hypertonic hyperosmolar hyperglycemia-treat as above - Corrected serum sodium level is 135, [place], 1999  # Left hand skin lesion-presumed ring worm  Chart reviewed.   Hospitalization from 12/26/2020 to 01/16/2021: Patient was hospitalized for hyperosmolar hyperglycemic state, with complications from alcohol abuse, noncompliance with medications, sepsis with acute renal failure without septic shock, malnutrition, and acute kidney injury Patient was started on IV insulin and titrated off, diabetes coordinator consultation was ordered, Lantus and NovoLog dosing were adjusted.  Patient had a severe sepsis secondary to E. coli bacteremia, source of bacteremia was unclear at that time.  Patient completed antibiotic course.  And leukocytosis resolved.  DVT prophylaxis: Heparin 5000 units subcutaneous every 8 hours Code Status: Full code Diet: N.p.o. Family Communication: Updated mother, Jaquis Picklesimer over the phone Disposition Plan: Pending clinical course, anticipate greater than 2 night stay Consults called: None at this time Admission status: Inpatient, stepdown  Past Medical History:  Diagnosis Date   Alcohol abuse    Anxiety    Diabetes mellitus without complication ([place])    Hypertension    Pancreatitis    History reviewed. No pertinent surgical history.  Social History:  reports that he has never smoked. He has never used smokeless tobacco. He reports current alcohol use. He reports current drug use. Drugs: "Crack" cocaine and Benzodiazepines.  Allergies  Allergen Reactions   Antihistamines, Chlorpheniramine-Type Anaphylaxis   Family History  Problem Relation Age of Onset   Alcoholism Father    Family history: Family history reviewed and not pertinent  Prior to Admission  medications   Medication Sig Start Date End Date Taking? Authorizing Provider  blood glucose meter kit and supplies KIT Dispense based on patient and insurance preference. Use up to four times daily as directed. 01/16/21   Dessa Phi, DO  gabapentin (NEURONTIN) 300 MG capsule Take 2 capsules (600 mg total) by mouth 2 (two) times daily. Patient taking differently: Take 600 mg by mouth 3 (three) times daily. 05/20/20   Fritzi Mandes, MD  insulin aspart (NOVOLOG) 100 UNIT/ML FlexPen Inject 20 Units into the skin 3 (three) times daily with meals. 01/16/21 04/01/21  Dessa Phi, DO  insulin glargine (LANTUS) 100 UNIT/ML Solostar Pen 50 units in the morning, 30 units at bedtime 01/16/21   Dessa Phi, DO  Insulin Pen Needle 31G X 5 MM MISC 1 each by Does not apply route 4 (four) times daily. 01/16/21   Dessa Phi, DO  losartan (COZAAR) 50 MG tablet Take 1 tablet (50 mg total) by mouth daily. Patient taking differently: Take 100 mg by mouth daily. 05/20/20   Fritzi Mandes, MD  metFORMIN (GLUCOPHAGE) 1000 MG tablet Take 1 tablet (1,000 mg total) by mouth 2 (two) times daily with a meal. 05/20/20   Fritzi Mandes, MD  Multiple Vitamin (MULTIVITAMIN WITH MINERALS) TABS tablet Take 1 tablet by mouth daily. 05/21/20   Fritzi Mandes, MD   Physical Exam: Vitals:   08/25/21 1530 08/25/21 1700 08/25/21 1711 08/25/21 1800  BP: (!) 172/121 (!) 189/117  (!) 181/112  Pulse: (!) 119 (!) 115 (!) 109 (!) 112  Resp: 17 19 (!) 30 (!) 21  Temp: 99.4 F (37.4 C)     TempSrc: Rectal     SpO2: 98% 97% 96% 97%   Constitutional: appears older than chronological age, NAD, calm, comfortable Eyes: PERRL, lids and conjunctivae normal ENMT: Mucous membranes are dry. Posterior pharynx clear of any exudate or lesions. Poor dentition. Hearing appropriate Neck: normal, supple, no masses, no thyromegaly Respiratory: clear to auscultation bilaterally, no wheezing, no crackles. Normal respiratory effort. No accessory muscle use.   Cardiovascular: Regular rate and rhythm, no murmurs / rubs / gallops. No extremity edema. 2+ pedal pulses. No carotid bruits.  Abdomen: no tenderness, no masses palpated, no hepatosplenomegaly. Bowel sounds positive.  Musculoskeletal: no clubbing / cyanosis. No joint deformity upper and lower extremities. Good ROM, no contractures, no atrophy. Normal muscle tone.  Skin: + left hand skin lesion      Neurologic: Sensation intact. Strength 5/5 in all 4.  Psychiatric: Currently not exhibiting appropriate judgment and insight. Alert and oriented x self, age, location. Depressed mood.   EKG: independently reviewed, showing sinus tachycardia with rate of 119, QTc 420  Chest x-ray on Admission: I personally reviewed and I agree with radiologist reading as below.  CT HEAD WO CONTRAST (5MM)  Result Date: 08/25/2021 CLINICAL DATA:  Pt was found face down in upstairs bedroom at home. Mental status change, unknown cause EXAM: CT HEAD WITHOUT CONTRAST TECHNIQUE: Contiguous axial images were obtained from the base of the skull through the vertex without intravenous contrast. COMPARISON:  CT head 01/05/2021 BRAIN: BRAIN Cerebral ventricle sizes are concordant with the degree of cerebral volume loss. Patchy and confluent areas of decreased attenuation are noted throughout the deep and periventricular white matter of the cerebral hemispheres bilaterally, compatible with chronic microvascular ischemic disease. No evidence of large-territorial acute infarction.  No parenchymal hemorrhage. No mass lesion. No extra-axial collection. No mass effect or midline shift. No hydrocephalus. Basilar cisterns are patent. Vascular: No hyperdense vessel. Skull: No acute fracture or focal lesion. Sinuses/Orbits: Paranasal sinuses and mastoid air cells are clear. The orbits are unremarkable. Other: None. IMPRESSION: No acute intracranial abnormality. Electronically Signed   By: Iven Finn M.D.   On: 08/25/2021 16:22   DG  Chest Portable 1 View  Result Date: 08/25/2021 CLINICAL DATA:  Altered mental status, found face down EXAM: PORTABLE CHEST 1 VIEW COMPARISON:  01/05/2021 FINDINGS: Cardiac size is within normal limits. Thoracic aorta is tortuous and ectatic. There are no signs of pulmonary edema. Linear densities in the right lower lung fields suggest subsegmental atelectasis. There is no pleural effusion or pneumothorax. IMPRESSION: Linear densities in the right lower lung fields suggest subsegmental atelectasis. There are no signs of pulmonary edema or focal pulmonary consolidation. Electronically Signed   By: Elmer Picker M.D.   On: 08/25/2021 16:29    Labs on Admission: I have personally reviewed following labs  CBC: Recent Labs  Lab 08/25/21 1524  WBC 16.5*  HGB 16.1  HCT 45.3  MCV 103.2*  PLT 754   Basic Metabolic Panel: Recent Labs  Lab 08/25/21 1524 08/25/21 1830  NA 130* 133*  K 5.2* 5.6*  CL 89* 95*  CO2 18* 18*  GLUCOSE 301* 538*  BUN 67* 71*  CREATININE 3.02* 2.88*  CALCIUM 11.6* 10.4*  MG  --  2.2  PHOS  --  3.9   GFR: CrCl cannot be calculated (Unknown ideal weight.).  Liver Function Tests: Recent Labs  Lab 08/25/21 1524  AST 79*  ALT 50*  ALKPHOS 92  BILITOT 2.2*  PROT 9.9*  ALBUMIN 4.9   Cardiac Enzymes: Recent Labs  Lab 08/25/21 1524  CKTOTAL 3,944*   CBG: Recent Labs  Lab 08/25/21 1516 08/25/21 1835 08/25/21 1931  GLUCAP 558* 468* 465*   Urine analysis:    Component Value Date/Time   COLORURINE STRAW (A) 01/05/2021 2110   APPEARANCEUR CLEAR (A) 01/05/2021 2110   APPEARANCEUR Clear 08/18/2014 1318   LABSPEC 1.017 01/05/2021 2110   LABSPEC 1.024 08/18/2014 1318   PHURINE 6.0 01/05/2021 2110   GLUCOSEU >=500 (A) 01/05/2021 2110   GLUCOSEU Negative 08/18/2014 1318   HGBUR MODERATE (A) 01/05/2021 2110   BILIRUBINUR NEGATIVE 01/05/2021 2110   BILIRUBINUR Negative 08/18/2014 1318   KETONESUR 5 (A) 01/05/2021 2110   PROTEINUR NEGATIVE  01/05/2021 2110   NITRITE NEGATIVE 01/05/2021 2110   LEUKOCYTESUR TRACE (A) 01/05/2021 2110   LEUKOCYTESUR Negative 08/18/2014 1318   CRITICAL CARE Performed by: Briant Cedar Amiracle Neises  Total critical care time: 60 minutes  Critical care time was exclusive of separately billable procedures and treating other patients.  Critical care was necessary to treat or prevent imminent or life-threatening deterioration:  Endocrine crisis, circulatory failure, severe sepsis, rhabdomyolysis.  Critical care was time spent personally by me on the following activities: development of treatment plan with patient and/or surrogate as well as nursing, discussions with consultants, evaluation of patient's response to treatment, examination of patient, obtaining history from patient or surrogate, ordering and performing treatments and interventions, ordering and review of laboratory studies, ordering and review of radiographic studies, pulse oximetry and re-evaluation of patient's condition.  Dr. Tobie Poet [facility]  If 7PM-7AM, please contact overnight-coverage provider If 7AM-7PM, please contact day coverage provider [email or web address]  08/25/2021, 7:49 PM

## 2021-08-25 NOTE — ED Triage Notes (Signed)
Pt arrives via [facility] with reports of AMS. Pt was found face down in upstairs bedroom at home. Pt was last seen last night at 1900. Pt BS over 500 with EMS.

## 2021-08-26 DIAGNOSIS — E1110 Type 2 diabetes mellitus with ketoacidosis without coma: Secondary | ICD-10-CM | POA: Diagnosis not present

## 2021-08-26 DIAGNOSIS — M6282 Rhabdomyolysis: Secondary | ICD-10-CM | POA: Diagnosis not present

## 2021-08-26 DIAGNOSIS — N179 Acute kidney failure, unspecified: Secondary | ICD-10-CM | POA: Diagnosis not present

## 2021-08-26 DIAGNOSIS — F1910 Other psychoactive substance abuse, uncomplicated: Secondary | ICD-10-CM | POA: Diagnosis not present

## 2021-08-26 LAB — HEMOGLOBIN A1C
Hgb A1c MFr Bld: 6.8 % — ABNORMAL HIGH (ref 4.8–5.6)
Mean Plasma Glucose: 148.46 mg/dL

## 2021-08-26 LAB — BASIC METABOLIC PANEL
Anion gap: 13 (ref 5–15)
Anion gap: 9 (ref 5–15)
Anion gap: 8 (ref 5–15)
BUN: 67 mg/dL — ABNORMAL HIGH (ref 6–20)
BUN: 65 mg/dL — ABNORMAL HIGH (ref 6–20)
BUN: 59 mg/dL — ABNORMAL HIGH (ref 6–20)
CO2: 22 mmol/L (ref 22–32)
CO2: 23 mmol/L (ref 22–32)
CO2: 25 mmol/L (ref 22–32)
Calcium: 10.1 mg/dL (ref 8.9–10.3)
Calcium: 9.6 mg/dL (ref 8.9–10.3)
Calcium: 9.3 mg/dL (ref 8.9–10.3)
Chloride: 103 mmol/L (ref 98–111)
Chloride: 103 mmol/L (ref 98–111)
Chloride: 102 mmol/L (ref 98–111)
Creatinine, Ser: 2.72 mg/dL — ABNORMAL HIGH (ref 0.61–1.24)
Creatinine, Ser: 2.37 mg/dL — ABNORMAL HIGH (ref 0.61–1.24)
Creatinine, Ser: 2.29 mg/dL — ABNORMAL HIGH (ref 0.61–1.24)
GFR, Estimated: 28 mL/min — ABNORMAL LOW (ref >60)
GFR, Estimated: 33 mL/min — ABNORMAL LOW (ref >60)
GFR, Estimated: 34 mL/min — ABNORMAL LOW (ref >60)
Glucose, Bld: 216 mg/dL — ABNORMAL HIGH (ref 70–99)
Glucose, Bld: 178 mg/dL — ABNORMAL HIGH (ref 70–99)
Glucose, Bld: 95 mg/dL (ref 70–99)
Potassium: 4.4 mmol/L (ref 3.5–5.1)
Potassium: 3.8 mmol/L (ref 3.5–5.1)
Potassium: 3.7 mmol/L (ref 3.5–5.1)
Sodium: 138 mmol/L (ref 135–145)
Sodium: 135 mmol/L (ref 135–145)
Sodium: 135 mmol/L (ref 135–145)

## 2021-08-26 LAB — RESPIRATORY PANEL BY PCR

## 2021-08-26 LAB — CBG MONITORING, ED
Glucose-Capillary: 123 mg/dL — ABNORMAL HIGH (ref 70–99)
Glucose-Capillary: 137 mg/dL — ABNORMAL HIGH (ref 70–99)
Glucose-Capillary: 168 mg/dL — ABNORMAL HIGH (ref 70–99)
Glucose-Capillary: 175 mg/dL — ABNORMAL HIGH (ref 70–99)
Glucose-Capillary: 189 mg/dL — ABNORMAL HIGH (ref 70–99)
Glucose-Capillary: 190 mg/dL — ABNORMAL HIGH (ref 70–99)
Glucose-Capillary: 195 mg/dL — ABNORMAL HIGH (ref 70–99)
Glucose-Capillary: 203 mg/dL — ABNORMAL HIGH (ref 70–99)
Glucose-Capillary: 214 mg/dL — ABNORMAL HIGH (ref 70–99)
Glucose-Capillary: 217 mg/dL — ABNORMAL HIGH (ref 70–99)
Glucose-Capillary: 227 mg/dL — ABNORMAL HIGH (ref 70–99)
Glucose-Capillary: 234 mg/dL — ABNORMAL HIGH (ref 70–99)
Glucose-Capillary: 80 mg/dL (ref 70–99)

## 2021-08-26 LAB — BETA-HYDROXYBUTYRIC ACID
Beta-Hydroxybutyric Acid: 2.21 mmol/L — ABNORMAL HIGH (ref 0.05–0.27)
Beta-Hydroxybutyric Acid: 0.62 mmol/L — ABNORMAL HIGH (ref 0.05–0.27)

## 2021-08-26 LAB — MAGNESIUM: Magnesium: 1.5 mg/dL — ABNORMAL LOW (ref 1.7–2.4)

## 2021-08-26 LAB — PROCALCITONIN: Procalcitonin: 0.63 ng/mL

## 2021-08-26 LAB — PHOSPHORUS: Phosphorus: 2.6 mg/dL (ref 2.5–4.6)

## 2021-08-26 LAB — VANCOMYCIN, RANDOM: Vancomycin Rm: 12

## 2021-08-26 LAB — CK: Total CK: 3902 U/L — ABNORMAL HIGH (ref 49–397)

## 2021-08-26 LAB — VITAMIN B12: Vitamin B-12: 758 pg/mL (ref 180–914)

## 2021-08-26 MED ORDER — LACTATED RINGERS IV BOLUS
500.0000 mL | Freq: Once | INTRAVENOUS | Status: AC
  Administered 2021-08-26: 500 mL via INTRAVENOUS

## 2021-08-26 MED ORDER — INSULIN ASPART 100 UNIT/ML IJ SOLN
0.0000 [IU] | Freq: Three times a day (TID) | INTRAMUSCULAR | Status: DC
  Administered 2021-08-26 – 2021-08-27 (×4): 7 [IU] via SUBCUTANEOUS
  Administered 2021-08-27: 20 [IU] via SUBCUTANEOUS
  Filled 2021-08-26 (×5): qty 1

## 2021-08-26 MED ORDER — MAGNESIUM SULFATE 4 GM/100ML IV SOLN
4.0000 g | Freq: Once | INTRAVENOUS | Status: AC
  Administered 2021-08-26: 4 g via INTRAVENOUS
  Filled 2021-08-26: qty 100

## 2021-08-26 MED ORDER — CHLORDIAZEPOXIDE HCL 25 MG PO CAPS
50.0000 mg | ORAL_CAPSULE | Freq: Three times a day (TID) | ORAL | Status: DC
Start: 2021-08-26 — End: 2021-08-27
  Administered 2021-08-26 – 2021-08-27 (×3): 50 mg via ORAL
  Filled 2021-08-26 (×3): qty 2

## 2021-08-26 MED ORDER — LACTATED RINGERS IV SOLN: INTRAVENOUS | Status: DC

## 2021-08-26 MED ORDER — ACETAMINOPHEN 500 MG PO TABS
1000.0000 mg | ORAL_TABLET | Freq: Four times a day (QID) | ORAL | Status: DC | PRN
  Administered 2021-08-26: 1000 mg via ORAL

## 2021-08-26 MED ORDER — ACETAMINOPHEN 325 MG RE SUPP: 650.0000 mg | Freq: Four times a day (QID) | RECTAL | Status: AC

## 2021-08-26 MED ORDER — ACETAMINOPHEN 500 MG PO TABS
1000.0000 mg | ORAL_TABLET | Freq: Four times a day (QID) | ORAL | Status: AC
  Administered 2021-08-26: 1000 mg via ORAL
  Filled 2021-08-26 (×2): qty 2

## 2021-08-26 MED ORDER — LORAZEPAM 2 MG/ML IJ SOLN
1.0000 mg | Freq: Four times a day (QID) | INTRAMUSCULAR | Status: DC
Start: 2021-08-26 — End: 2021-08-27
  Administered 2021-08-26 – 2021-08-27 (×5): 1 mg via INTRAVENOUS
  Filled 2021-08-26 (×6): qty 1

## 2021-08-26 MED ORDER — INSULIN GLARGINE-YFGN 100 UNIT/ML ~~LOC~~ SOLN
30.0000 [IU] | Freq: Every day | SUBCUTANEOUS | Status: DC
  Administered 2021-08-26 – 2021-08-27 (×2): 30 [IU] via SUBCUTANEOUS
  Filled 2021-08-26 (×2): qty 0.3

## 2021-08-26 MED ORDER — VANCOMYCIN HCL 1250 MG/250ML IV SOLN
1250.0000 mg | INTRAVENOUS | Status: DC
  Administered 2021-08-26: 1250 mg via INTRAVENOUS
  Filled 2021-08-26: qty 250

## 2021-08-26 MED ORDER — ACETAMINOPHEN 325 MG RE SUPP: 650.0000 mg | Freq: Four times a day (QID) | RECTAL | Status: DC | PRN

## 2021-08-26 NOTE — ED Notes (Signed)
Pt eating sandwich tray. Safety sitter at bedside. A&O x4. Argumentative at this time.

## 2021-08-26 NOTE — Progress Notes (Signed)
Pharmacy Antibiotic Note  Andrew Buchanan is a 50 y.o. male admitted on 08/25/2021 with sepsis and acute renal failure.   Pharmacy has been consulted for Vancomycin and Cefepime dosing.  Plan: Vancomycin: Patient s/p vanc 1750 mg. Renal function improving, will order vanc level this evening at 1800 to assess for clearance. Plan to redose as appropriate.  Cefepime: Continue 2g IV every 12 hours for CrCl 30-43ml/min   Pharmacy will monitor renal function and adjust dose as needed.   Height: 6' (182.9 cm) Weight: 85.9 kg (189 lb 6 oz) IBW/kg (Calculated) : 77.6  Temp (24hrs), Avg:100.7 F (38.2 C), Min:98.4 F (36.9 C), Max:101.9 F (38.8 C)  Recent Labs  Lab 08/25/21 1524 08/25/21 1830 08/25/21 1925 08/25/21 2055 08/26/21 0123 08/26/21 0610 08/26/21 1113  WBC 16.5*  --   --   --   --   --   --   CREATININE 3.02* 2.88* 2.65*  --  2.72* 2.37* 2.29*  LATICACIDVEN  --   --  1.9 1.9  --   --   --      Estimated Creatinine Clearance: 42.4 mL/min (A) (by C-G formula based on SCr of 2.29 mg/dL (H)).    Allergies  Allergen Reactions   Antihistamines, Chlorpheniramine-Type Anaphylaxis    Antimicrobials this admission: 11/18 vancomycin >> 11/18 Cefepime >>  Microbiology results: 11/18 BCx: NG pending 11/18 UCx: pending   Thank you for allowing pharmacy to be a part of this patient's care.  Pricilla Riffle, PharmD, BCPS Clinical Pharmacist 08/26/2021 2:02 PM

## 2021-08-26 NOTE — Progress Notes (Signed)
Pharmacy Antibiotic Note  Andrew Buchanan is a 50 y.o. male admitted on 08/25/2021 with sepsis and acute renal failure.  Pharmacy has been consulted for Vancomycin and Cefepime dosing.  Plan: Vancomycin: S/p vancomycin 1750 mg IV loading dose, Scr improving Vanc random 11/19 @ 1914 = 12 mcg/mL  Will order vancomycin 1250 mg IV q24 hours. Goal AUC 400-550  Est AUC: 510.9  Est Cmax: 33.0  Est Cmin: 13.5 Calculated with SCr 2.29  Cefepime: Continue 2g IV every 12 hours for CrCl 30-44ml/min   Monitor clinical picture, renal function, and vancomycin levels at steady state F/U C&S, abx deescalation / LOT   Height: 6' (182.9 cm) Weight: 85.9 kg (189 lb 6 oz) IBW/kg (Calculated) : 77.6  Temp (24hrs), Avg:100.4 F (38 C), Min:97.9 F (36.6 C), Max:101.9 F (38.8 C)  Recent Labs  Lab 08/25/21 1524 08/25/21 1830 08/25/21 1925 08/25/21 2055 08/26/21 0123 08/26/21 0610 08/26/21 1113 08/26/21 1914  WBC 16.5*  --   --   --   --   --   --   --   CREATININE 3.02* 2.88* 2.65*  --  2.72* 2.37* 2.29*  --   LATICACIDVEN  --   --  1.9 1.9  --   --   --   --   VANCORANDOM  --   --   --   --   --   --   --  12     Estimated Creatinine Clearance: 42.4 mL/min (A) (by C-G formula based on SCr of 2.29 mg/dL (H)).    Allergies  Allergen Reactions   Antihistamines, Chlorpheniramine-Type Anaphylaxis    Antimicrobials this admission: 11/18 vancomycin >> 11/18 Cefepime >>  Microbiology results: 11/18 BCx: NG pending 11/18 UCx: pending   Thank you for allowing pharmacy to be a part of this patient's care.  Derrek Gu, PharmD Clinical Pharmacist 08/26/2021 7:57 PM

## 2021-08-26 NOTE — Progress Notes (Addendum)
Inpatient Diabetes Program Recommendations  AACE/ADA: New Consensus Statement on Inpatient Glycemic Control (2015)  Target Ranges:  Prepandial:   less than 140 mg/dL      Peak postprandial:   less than 180 mg/dL (1-2 hours)      Critically ill patients:  140 - 180 mg/dL    Latest Reference Range & Units 08/25/21 18:30  Sodium 135 - 145 mmol/L 133 (L)  Potassium 3.5 - 5.1 mmol/L 5.6 (H)  Chloride 98 - 111 mmol/L 95 (L)  CO2 22 - 32 mmol/L 18 (L)  Glucose 70 - 99 mg/dL 197 (HH)  BUN 6 - 20 mg/dL 71 (H)  Creatinine 5.88 - 1.24 mg/dL 3.25 (H)  Calcium 8.9 - 10.3 mg/dL 49.8 (H)  Anion gap 5 - 15  20 (H)    Latest Reference Range & Units 08/26/21 04:04 08/26/21 04:57 08/26/21 06:10 08/26/21 08:06 08/26/21 09:00 08/26/21 10:06 08/26/21 11:31 08/26/21 12:12  Glucose-Capillary 70 - 99 mg/dL 264 (H) 158 (H) 309 (H) 217 (H)  7 units Novolog @0837  190 (H) 123 (H)  30 units Semglee @1109  80 137 (H)    Latest Reference Range & Units 01/06/21 06:27  Hemoglobin A1C 4.8 - 5.6 % >15.5 (H)    Admit with: DKA/ Sepsis from possible aspiration pneumonia/ Rhabdomyolysis  History: DM, Polysubstance Abuse, Alcohol Abuse  Home DM Meds: Novolog 6 units TID       Lantus 50 units AM/ 30 units PM (NOT taking)       Metformin 1000 mg BID  Current Orders: Semglee 30 QD     Novolog Resistant Correction Scale/ SSI (0-20 units) TID AC + HS     Transitioned to SQ Insulin this AM  11am BMET today showed Anion Gap 8 and CO2 level 25  Counseled extensively about the importance of good CBG control back in 2021.  Issues with Substance Abuse will make it difficult for pt to properly manage his diabetes.  Of note, Tox screen this admission was + for Cocaine  Will follow  --Will follow patient during hospitalization--  03/08/21 RN, MSN, CDE Diabetes Coordinator Inpatient Glycemic Control Team Team Pager: [phone] (8a-5p)

## 2021-08-26 NOTE — Progress Notes (Signed)
PROGRESS NOTE    Andrew Buchanan   IFO:[id number]  DOB: 1970/10/20  PCP: System, Provider Not In    DOA: 08/25/2021 LOS: 1    Brief Narrative / Hospital Course to Date:   50 y.o. male with medical history significant for insulin-dependent diabetes mellitus poorly controlled (A1c >15.5% in April 2022), anxiety, polysubstance and polypharmacy abuse, alcohol abuse, history of urethrocutaneous fistula, follows with [facility] urology, who presented ED from home via EMS on 08/25/21 after being found down.  Further history on admission was limited due to patient being altered.  His mother reported pt drinks about 1/5 of Vodka daily and uses Klonopin and pain medications routinely.    He was tachycardic and hypertensive in the ED, afebrile with temp 99.4 F. Patient found to be in DKA and was started on insulin drip, found to have rhabdomyolysis with CK 3944 and was started on aggressive IV hydration for this and DKA.  Treated per sepsis protocol in the ED with infectious work up underway, but chest xray without signs of pneumonia, UA with pyuria and bacteruria.  Continued on empiric antibiotics on admission pending cultures and further history and evaluation.  Also placed on CIWA protocol with PRN Ativan for anticipated alcohol withdrawal.    Assessment & Plan   Principal Problem:   Rhabdomyolysis Active Problems:   Alcohol abuse   Bipolar disorder, unspecified (HCC)   DKA (diabetic ketoacidosis) (HCC)   Noncompliance with medications   Sepsis with acute renal failure without septic shock (HCC)   AKI (acute kidney injury) (HCC)   Polysubstance abuse (HCC)   Anxiety   History of insulin dependent diabetes mellitus   Elevated troponin   Primary hypertension   History of bacteremia   SIRS vs Severe Sepsis POA with tachypnea, leukocytosis, fever, source ?aspiration pneumonia vs UTI Source of infection not yet clear.  Meets severe sepsis criteria given AKI, encephalopathy, DKA - Multiple  risk factors for severe sepsis secondary to infectious etiology including uncontrolled insulin-dependent diabetes mellitus, polypharmacy, polysubstance drug abuse including ethanol and crack cocaine - Follow blood cultures  - Follow Urine Cx, UA with pyuria and bacteruria - Follow up Procalcitonin (elevated) - Continue empiric broad-spectrum antibiotics - vancomycin, cefepime, Zithromax for atypical coverage    Diabetic ketoacidosis secondary to poorly controlled insulin-dependent diabetes mellitus Metabolic acidosis with anion gap Insulin-dependent diabetes mellitus Hyperglycemia 11/19 AM - DKA resolved. - Transition off insulin GTT to subcutaneous insulin --Stable to admit to med/surg + telemetry, no longer requires stepdown level of care --Follow and replace electrolytes --Titrate insulin (goal 140-180)   Hypomagnesemia - Mg 1.5, replacing with 4 g IV Mg-sulfate. Monitor and replace as needed.   Elevated HS troponin - demand ischemia secondary to multifactorial including severe sepsis, rhadomyolysis. Unlikely ACS, pt without chest pain or acute ischemic changes   Acute kidney injury - POA with C 2.65.   Multifactorial due to rhabdo, ?sepsis Treat as above.  On IV fluids.  Monitor BMP. Renally dose meds.    Rhabdomyolysis - suspect EtOH withdrawal seizure given tongue injury and ecchymosis and pt reports hx of this.   Other differentials include dehydration, DKA, syncopal event - Continue with aggressive fluid hydration - Follow CK level and BMP's   Right lateral thigh (consistent with vastus lateralis) muscle contusion and swelling - POA - Image in media from admission physical exam CT of the right femur without contrast: 1. Subcutaneus soft tissue edema along the lateral mid to distal thigh. No associated mass, abscess formed  should, large hematoma formation. 2. Trace right hip effusion. 3. Right femoral head avascular necrosis.   Primary hypertension - home losartan 50 mg  on hold due to AKI.   - PRN Labetalol  -- resume PO antihypertensive when renal fx improves or use alternate agent    Insulin-dependent diabetes mellitus - home regimen appears to be insulin glargine 50 units qAM, 30 units qHS; insulin aspart 20 units into the skin 3 times TID WC. Unknown compliance, but suspect noncompliance since A1c was > 15 in April 2022 - Ultimate goal of blood glucose control while inpatient is 140-180 - Transitioned off insulin GTT to Semglee and Novolog this AM - Check A1c - pending   Syncope? - Hx of present illness is unclear. CT the head no acute intracranial abnormalities. Suspect possible withdrawal seizure vs infection/hypotension - Etiology work-up in progress at this time - Check B12, B1  History of alcohol abuse - ethanol level was negative on admission.  Pt endorsed hx of withdrawal seizure and have dark ecchymosis of his tongue noted morning after admission. Mother reported hx of tremors without EtOH intake.  - CIWA precaution initiated -- PRN Ativan -- Scheduled Librium when alert / safe to take PO   Hyponatremia POA secondary to hyperglycemia. Corrected serum sodium level on admission 135. Monitor BMP   Left hand skin lesion - presumed ring worm   Chart reviewed.     Patient BMI: Body mass index is 25.68 kg/m.   DVT prophylaxis: heparin injection 5,000 Units Start: 08/25/21 2200 SCDs Start: 08/25/21 1828   Diet:  Diet Orders (From admission, onward)     Start     Ordered   08/26/21 0930  Diet NPO time specified Except for: Sips with Meds  Diet effective now       Question:  Except for  Answer:  Clearance CootsSips with Meds   08/26/21 0929              Code Status: Full Code   Subjective 08/26/21    Pt seen in ED holding for a bed.  He is somnolent, difficult to arouse or to keep him awake.  Per bedside RN, gets agitated and combative at times when awake.  He is not able to tell much  history.  Noted his tongue with dark ecchymosis and he  does admit to hx of withdrawal seizures.  Falls asleep and does not answer further questions.  Given Ativan this AM for high CIWA scores.    Disposition Plan & Communication   Status is: Inpatient  Remains inpatient appropriate because: Severity of illness as outlined above.     Consults, Procedures, Significant Events   Consultants:  None  Procedures:  None  Antimicrobials:  Anti-infectives (From admission, onward)    Start     Dose/Rate Route Frequency Ordered Stop   08/26/21 0600  ceFEPIme (MAXIPIME) 2 g in sodium chloride 0.9 % 100 mL IVPB        2 g 200 mL/hr over 30 Minutes Intravenous Every 12 hours 08/25/21 2018     08/26/21 0000  vancomycin variable dose per unstable renal function (pharmacist dosing)         Does not apply See admin instructions 08/25/21 2014     08/25/21 2130  azithromycin (ZITHROMAX) 500 mg in sodium chloride 0.9 % 250 mL IVPB        500 mg 250 mL/hr over 60 Minutes Intravenous Every 24 hours 08/25/21 2124 08/30/21 2129   08/25/21 2030  vancomycin (VANCOREADY) IVPB 750 mg/150 mL        750 mg 150 mL/hr over 60 Minutes Intravenous  Once 08/25/21 2014 08/25/21 2333   08/25/21 1815  ceFEPIme (MAXIPIME) 2 g in sodium chloride 0.9 % 100 mL IVPB        2 g 200 mL/hr over 30 Minutes Intravenous  Once 08/25/21 1810 08/25/21 1918   08/25/21 1815  metroNIDAZOLE (FLAGYL) IVPB 500 mg        500 mg 100 mL/hr over 60 Minutes Intravenous  Once 08/25/21 1810 08/25/21 1957   08/25/21 1815  vancomycin (VANCOCIN) IVPB 1000 mg/200 mL premix        1,000 mg 200 mL/hr over 60 Minutes Intravenous  Once 08/25/21 1810 08/25/21 2023         Micro    Objective   Vitals:   08/26/21 0927 08/26/21 1000 08/26/21 1100 08/26/21 1308  BP: (!) 146/83 124/86 (!) 147/98 140/88  Pulse: 90 79 74 79  Resp:  13 (!) 22   Temp:      TempSrc:      SpO2:  97% 100%   Weight:      Height:        Intake/Output Summary (Last 24 hours) at 08/26/2021 1332 Last data filed  at 08/26/2021 [id number] Gross per 24 hour  Intake 2549.08 ml  Output 650 ml  Net 1899.08 ml   Filed Weights   08/25/21 1950  Weight: 85.9 kg    Physical Exam:  General exam: somnolent, snoring, arouses to vigorous stimulus and loud voice but quickly falls to sleep, no acute distress HEENT: tongue w dark ecchymosis, moist mucus membranes, hearing grossly normal  Respiratory system: rhonchi vs referred upper airway secretion sounds, no wheezes, normal respiratory effort. Cardiovascular system: normal S1/S2, RRR, no pedal edema.   Gastrointestinal system: soft, non-tender abdomen Central nervous system: exam limited by pt condition. no gross focal neurologic deficits, normal speech Skin: dry, intact, normal temperature Psychiatry: unable to adequately evaluate at this time due to pt somnolent   Labs   Data Reviewed: I have personally reviewed following labs and imaging studies  CBC: Recent Labs  Lab 08/25/21 1524  WBC 16.5*  HGB 16.1  HCT 45.3  MCV 103.2*  PLT [id number]   Basic Metabolic Panel: Recent Labs  Lab 08/25/21 1830 08/25/21 1925 08/26/21 0123 08/26/21 0406 08/26/21 0610 08/26/21 1113  NA 133* 135 138  --  135 135  K 5.6* 4.2 4.4  --  3.8 3.7  CL 95* 99 103  --  103 102  CO2 18* 20* 22  --  23 25  GLUCOSE 538* 367* 216*  --  178* 95  BUN 71* 69* 67*  --  65* 59*  CREATININE 2.88* 2.65* 2.72*  --  2.37* 2.29*  CALCIUM 10.4* 10.2 10.1  --  9.6 9.3  MG 2.2  --   --  1.5*  --   --   PHOS 3.9  --   --  2.6  --   --    GFR: Estimated Creatinine Clearance: 42.4 mL/min (A) (by C-G formula based on SCr of 2.29 mg/dL (H)). Liver Function Tests: Recent Labs  Lab 08/25/21 1524  AST 79*  ALT 50*  ALKPHOS 92  BILITOT 2.2*  PROT 9.9*  ALBUMIN 4.9   No results for input(s): LIPASE, AMYLASE in the last 168 hours. No results for input(s): AMMONIA in the last 168 hours. Coagulation Profile: No results for input(s): INR, PROTIME in  the last 168 hours. Cardiac  Enzymes: Recent Labs  Lab 08/25/21 1524 08/26/21 0406  CKTOTAL 3,944* 3,902*   BNP (last 3 results) No results for input(s): PROBNP in the last 8760 hours. HbA1C: No results for input(s): HGBA1C in the last 72 hours. CBG: Recent Labs  Lab 08/26/21 0806 08/26/21 0900 08/26/21 1006 08/26/21 1131 08/26/21 1212  GLUCAP 217* 190* 123* 80 137*   Lipid Profile: No results for input(s): CHOL, HDL, LDLCALC, TRIG, CHOLHDL, LDLDIRECT in the last 72 hours. Thyroid Function Tests: No results for input(s): TSH, T4TOTAL, FREET4, T3FREE, THYROIDAB in the last 72 hours. Anemia Panel: Recent Labs    08/25/21 1925  VITAMINB12 758   Sepsis Labs: Recent Labs  Lab 08/25/21 1830 08/25/21 1925 08/25/21 2055 08/26/21 0406  PROCALCITON 0.62  --   --  0.63  LATICACIDVEN  --  1.9 1.9  --     Recent Results (from the past 240 hour(s))  Resp Panel by RT-PCR (Flu A&B, Covid) Nasopharyngeal Swab     Status: None   Collection Time: 08/25/21  4:43 PM   Specimen: Nasopharyngeal Swab; Nasopharyngeal(NP) swabs in vial transport medium  Result Value Ref Range Status   SARS Coronavirus 2 by RT PCR NEGATIVE NEGATIVE Final    Comment: (NOTE) SARS-CoV-2 target nucleic acids are NOT DETECTED.  The SARS-CoV-2 RNA is generally detectable in upper respiratory specimens during the acute phase of infection. The lowest concentration of SARS-CoV-2 viral copies this assay can detect is 138 copies/mL. A negative result does not preclude SARS-Cov-2 infection and should not be used as the sole basis for treatment or other patient management decisions. A negative result may occur with  improper specimen collection/handling, submission of specimen other than nasopharyngeal swab, presence of viral mutation(s) within the areas targeted by this assay, and inadequate number of viral copies(<138 copies/mL). A negative result must be combined with clinical observations, patient history, and  epidemiological information. The expected result is Negative.  Fact Sheet for Patients:  EntrepreneurPulse.com.au  Fact Sheet for Healthcare Providers:  IncredibleEmployment.be  This test is no t yet approved or cleared by the [place] FDA and  has been authorized for detection and/or diagnosis of SARS-CoV-2 by FDA under an Emergency Use Authorization (EUA). This EUA will remain  in effect (meaning this test can be used) for the duration of the COVID-19 declaration under Section 564(b)(1) of the Act, 21 U.S.C.section 360bbb-3(b)(1), unless the authorization is terminated  or revoked sooner.       Influenza A by PCR NEGATIVE NEGATIVE Final   Influenza B by PCR NEGATIVE NEGATIVE Final    Comment: (NOTE) The Xpert Xpress SARS-CoV-2/FLU/RSV plus assay is intended as an aid in the diagnosis of influenza from Nasopharyngeal swab specimens and should not be used as a sole basis for treatment. Nasal washings and aspirates are unacceptable for Xpert Xpress SARS-CoV-2/FLU/RSV testing.  Fact Sheet for Patients: EntrepreneurPulse.com.au  Fact Sheet for Healthcare Providers: IncredibleEmployment.be  This test is not yet approved or cleared by the [place] FDA and has been authorized for detection and/or diagnosis of SARS-CoV-2 by FDA under an Emergency Use Authorization (EUA). This EUA will remain in effect (meaning this test can be used) for the duration of the COVID-19 declaration under Section 564(b)(1) of the Act, 21 U.S.C. section 360bbb-3(b)(1), unless the authorization is terminated or revoked.  Performed at [facility], [address] [place], [place] [address]   Blood culture (routine x 2)     Status: None (Preliminary  result)   Collection Time: 08/25/21  6:31 PM   Specimen: BLOOD  Result Value Ref Range Status   Specimen Description BLOOD RIGHT ANTECUBITAL  Final   Special  Requests   Final    BOTTLES DRAWN AEROBIC AND ANAEROBIC Blood Culture adequate volume   Culture   Final    NO GROWTH < 12 HOURS Performed at [facility], [address] [place], [place] [address]    Report Status PENDING  Incomplete  Blood culture (routine x 2)     Status: None (Preliminary result)   Collection Time: 08/25/21  7:25 PM   Specimen: BLOOD  Result Value Ref Range Status   Specimen Description BLOOD RIGHT ANTECUBITAL  Final   Special Requests   Final    BOTTLES DRAWN AEROBIC ONLY Blood Culture results may not be optimal due to an inadequate volume of blood received in culture bottles   Culture   Final    NO GROWTH < 12 HOURS Performed at [facility], [address] [place], [place] [address]    Report Status PENDING  Incomplete  Respiratory (~20 pathogens) panel by PCR     Status: None   Collection Time: 08/26/21  4:06 AM   Specimen: Nasopharyngeal Swab; Respiratory  Result Value Ref Range Status   Adenovirus NOT DETECTED NOT DETECTED Final   Coronavirus 229E NOT DETECTED NOT DETECTED Final    Comment: (NOTE) The Coronavirus on the Respiratory Panel, DOES NOT test for the novel  Coronavirus (2019 nCoV)    Coronavirus HKU1 NOT DETECTED NOT DETECTED Final   Coronavirus NL63 NOT DETECTED NOT DETECTED Final   Coronavirus OC43 NOT DETECTED NOT DETECTED Final   Metapneumovirus NOT DETECTED NOT DETECTED Final   Rhinovirus / Enterovirus NOT DETECTED NOT DETECTED Final   Influenza A NOT DETECTED NOT DETECTED Final   Influenza B NOT DETECTED NOT DETECTED Final   Parainfluenza Virus 1 NOT DETECTED NOT DETECTED Final   Parainfluenza Virus 2 NOT DETECTED NOT DETECTED Final   Parainfluenza Virus 3 NOT DETECTED NOT DETECTED Final   Parainfluenza Virus 4 NOT DETECTED NOT DETECTED Final   Respiratory Syncytial Virus NOT DETECTED NOT DETECTED Final   Bordetella pertussis NOT DETECTED NOT DETECTED Final   Bordetella Parapertussis NOT DETECTED NOT DETECTED  Final   Chlamydophila pneumoniae NOT DETECTED NOT DETECTED Final   Mycoplasma pneumoniae NOT DETECTED NOT DETECTED Final    Comment: Performed at [facility], [address] [place], [place] [address]      Imaging Studies   CT HEAD WO CONTRAST (5MM)  Result Date: 08/25/2021 CLINICAL DATA:  Pt was found face down in upstairs bedroom at home. Mental status change, unknown cause EXAM: CT HEAD WITHOUT CONTRAST TECHNIQUE: Contiguous axial images were obtained from the base of the skull through the vertex without intravenous contrast. COMPARISON:  CT head 01/05/2021 BRAIN: BRAIN Cerebral ventricle sizes are concordant with the degree of cerebral volume loss. Patchy and confluent areas of decreased attenuation are noted throughout the deep and periventricular white matter of the cerebral hemispheres bilaterally, compatible with chronic microvascular ischemic disease. No evidence of large-territorial acute infarction. No parenchymal hemorrhage. No mass lesion. No extra-axial collection. No mass effect or midline shift. No hydrocephalus. Basilar cisterns are patent. Vascular: No hyperdense vessel. Skull: No acute fracture or focal lesion. Sinuses/Orbits: Paranasal sinuses and mastoid air cells are clear. The orbits are unremarkable. Other: None. IMPRESSION: No acute intracranial abnormality. Electronically Signed   By: Clelia Croft.D.  On: 08/25/2021 16:22   CT FEMUR RIGHT WO CONTRAST  Result Date: 08/25/2021 CLINICAL DATA:  Soft tissue mass. Thigh. Large right contusion with swelling of right lateral thigh. EXAM: CT OF THE LOWER RIGHT EXTREMITY WITHOUT CONTRAST TECHNIQUE: Multidetector CT imaging of the right lower extremity was performed according to the standard protocol. COMPARISON:  None. FINDINGS: Bones/Joint/Cartilage Right femoral head avascular necrosis. No acute displaced fracture. No dislocation. Trace right hip effusion (8:64, 9:95, 5:63). Ligaments Suboptimally assessed by CT.  Muscles and Tendons Unremarkable. Soft tissues Mild subcutaneus soft tissue edema along the proximal posterior medial thigh/gluteal regions. Subcutaneus soft tissue edema along the lateral mid to distal thigh (5: 158-255). No associated mass, abscess formed should, large hematoma formation. No subcutaneus soft tissue emphysema. Other: Visualized pelvis demonstrates diffuse sigmoid diverticulosis. Decompressed urinary bladder with marked circumferential urinary bladder wall thickening. Vascular calcifications. IMPRESSION: 1. Subcutaneus soft tissue edema along the lateral mid to distal thigh. No associated mass, abscess formed should, large hematoma formation. 2. Trace right hip effusion. 3. Right femoral head avascular necrosis. Electronically Signed   By: Iven Finn M.D.   On: 08/25/2021 21:42   DG Chest Portable 1 View  Result Date: 08/25/2021 CLINICAL DATA:  Altered mental status, found face down EXAM: PORTABLE CHEST 1 VIEW COMPARISON:  01/05/2021 FINDINGS: Cardiac size is within normal limits. Thoracic aorta is tortuous and ectatic. There are no signs of pulmonary edema. Linear densities in the right lower lung fields suggest subsegmental atelectasis. There is no pleural effusion or pneumothorax. IMPRESSION: Linear densities in the right lower lung fields suggest subsegmental atelectasis. There are no signs of pulmonary edema or focal pulmonary consolidation. Electronically Signed   By: Elmer Picker M.D.   On: 08/25/2021 16:29     Medications   Scheduled Meds:  acetaminophen  1,000 mg Oral Q6H   Or   acetaminophen  650 mg Rectal Q6H   chlordiazePOXIDE  50 mg Oral TID   folic acid  1 mg Oral Daily   heparin  5,000 Units Subcutaneous Q8H   insulin aspart  0-20 Units Subcutaneous TID AC & HS   insulin glargine-yfgn  30 Units Subcutaneous Daily   LORazepam  1 mg Intravenous Q6H   multivitamin with minerals  1 tablet Oral Daily   thiamine  100 mg Oral Daily   Or   thiamine  100 mg  Intravenous Daily   vancomycin variable dose per unstable renal function (pharmacist dosing)   Does not apply See admin instructions   Continuous Infusions:  azithromycin Stopped (08/25/21 2355)   ceFEPime (MAXIPIME) IV Stopped (08/26/21 [id number])   dextrose 5% lactated ringers 125 mL/hr at 08/25/21 2335   insulin Stopped (08/26/21 1257)   lactated ringers Stopped (08/25/21 2333)   lactated ringers 125 mL/hr at 08/26/21 0725       LOS: 1 day    Time spent: 35 minutes with > 50% spent at bedside and in coordination of care     Ezekiel Slocumb, DO [facility]  08/26/2021, 1:32 PM      If 7PM-7AM, please contact night-coverage. How to contact the [facility] Attending or Consulting provider [place] or covering provider during after hours [place], for this patient?    Check the care team in [facility] and look for a) attending/consulting TRH provider listed and b) the [facility] team listed Log into [email or web address] and use [place]'s universal password to access. If you do not have the password, please contact the hospital operator. Locate the  [place] provider you are looking for under [facility] and page to a number that you can be directly reached. If you still have difficulty reaching the provider, please page the [facility] (Director on Call) for the Hospitalists listed on amion for assistance.

## 2021-08-26 NOTE — ED Notes (Signed)
Patient continues to be agitated.  Asking to speak with mother, although just got off the phone with his mother.  Patient is not redirectable.  Patient has been ambulated to BR multiple times.  Gait is unsteady and required 1 assist at minimum.

## 2021-08-26 NOTE — TOC Initial Note (Signed)
Transition of Care (TOC) - Initial/Assessment Note    Patient Details  Name: Andrew Buchanan MRN: [id number] Date of Birth: 11/13/1970  Transition of Care (TOC) CM/SW Contact:    ,  Michelle, LCSW Phone Number: 08/26/2021, 11:46 AM  Clinical Narrative:                 Met with patient at bedside to provide substance abuse resources. Patient states that he resides with his father and plans to return to his home upon discharge. Patient states that he is followed  by  Family Practice in [place] and obtains his medications from [facility].  Patient declined need for substance abuse treatment however was agreeable to accepting resources. Resources left at bedside.  Transition of Care to continue to follow for discharge needs.    , LCSW Transition of Care [phone]         Patient Goals and CMS Choice        Expected Discharge Plan and Services                                                Prior Living Arrangements/Services                       Activities of Daily Living      Permission Sought/Granted                  Emotional Assessment              Admission diagnosis:  Rhabdomyolysis [M62.82] Patient Active Problem List   Diagnosis Date Noted   Rhabdomyolysis 08/25/2021   Polysubstance abuse (HCC) 08/25/2021   Anxiety 08/25/2021   History of insulin dependent diabetes mellitus 08/25/2021   Elevated troponin 08/25/2021   Primary hypertension 08/25/2021   History of bacteremia 08/25/2021   AKI (acute kidney injury) (HCC) 01/08/2021   Malnutrition of moderate degree 01/07/2021   Sepsis with acute renal failure without septic shock (HCC)    Hyperosmolar hyperglycemic state (HHS) (HCC) 01/05/2021   DKA (diabetic ketoacidosis) (HCC) 05/19/2020   Noncompliance with medications 05/19/2020   Alcohol intoxication delirium (HCC) 11/26/2018   Alcohol abuse 11/26/2018   Bipolar disorder,  unspecified (HCC) 11/26/2018   PCP:  System, Provider Not In Pharmacy:   [id number] - [place], [place] - [address] [place] [address] Phone: [phone] Fax: [phone]  [facility] - [place], [place] - [address] [place] [address] Phone: [phone] Fax: [phone]     Social Determinants of Health (SDOH) Interventions    Readmission Risk Interventions Readmission Risk Prevention Plan 01/12/2021  Transportation Screening Complete  PCP or Specialist Appt within 3-5 Days Complete  Social Work Consult for Recovery Care Planning/Counseling Complete  Palliative Care Screening Not Applicable  Medication Review (RN Care Manager) Complete  Some recent data might be hidden     

## 2021-08-27 ENCOUNTER — Encounter: Payer: Self-pay | Admitting: Internal Medicine

## 2021-08-27 LAB — CBG MONITORING, ED
Glucose-Capillary: 436 mg/dL — ABNORMAL HIGH (ref 70–99)
Glucose-Capillary: 412 mg/dL — ABNORMAL HIGH (ref 70–99)
Glucose-Capillary: 212 mg/dL — ABNORMAL HIGH (ref 70–99)
Glucose-Capillary: 87 mg/dL (ref 70–99)

## 2021-08-27 LAB — BASIC METABOLIC PANEL
Anion gap: 7 (ref 5–15)
BUN: 42 mg/dL — ABNORMAL HIGH (ref 6–20)
CO2: 23 mmol/L (ref 22–32)
Calcium: 8.6 mg/dL — ABNORMAL LOW (ref 8.9–10.3)
Chloride: 103 mmol/L (ref 98–111)
Creatinine, Ser: 1.76 mg/dL — ABNORMAL HIGH (ref 0.61–1.24)
GFR, Estimated: 47 mL/min — ABNORMAL LOW (ref >60)
Glucose, Bld: 279 mg/dL — ABNORMAL HIGH (ref 70–99)
Potassium: 3.7 mmol/L (ref 3.5–5.1)
Sodium: 133 mmol/L — ABNORMAL LOW (ref 135–145)

## 2021-08-27 LAB — CBC
HCT: 33.5 % — ABNORMAL LOW (ref 39.0–52.0)
Hemoglobin: 11.5 g/dL — ABNORMAL LOW (ref 13.0–17.0)
MCH: 35.9 pg — ABNORMAL HIGH (ref 26.0–34.0)
MCHC: 34.3 g/dL (ref 30.0–36.0)
MCV: 104.7 fL — ABNORMAL HIGH (ref 80.0–100.0)
Platelets: 97 10*3/uL — ABNORMAL LOW (ref 150–400)
RBC: 3.2 MIL/uL — ABNORMAL LOW (ref 4.22–5.81)
RDW: 12.7 % (ref 11.5–15.5)
WBC: 6.6 10*3/uL (ref 4.0–10.5)
nRBC: 0 % (ref 0.0–0.2)

## 2021-08-27 LAB — MAGNESIUM: Magnesium: 2 mg/dL (ref 1.7–2.4)

## 2021-08-27 LAB — PROCALCITONIN: Procalcitonin: 0.3 ng/mL

## 2021-08-27 LAB — CK: Total CK: 1923 U/L — ABNORMAL HIGH (ref 49–397)

## 2021-08-27 LAB — LACTIC ACID, PLASMA: Lactic Acid, Venous: 1 mmol/L (ref 0.5–1.9)

## 2021-08-27 MED ORDER — VANCOMYCIN HCL 1500 MG/300ML IV SOLN: 1500.0000 mg | INTRAVENOUS | Status: DC

## 2021-08-27 MED ORDER — ENSURE MAX PROTEIN PO LIQD
11.0000 [oz_av] | Freq: Every day | ORAL | Status: DC
Start: 2021-08-27 — End: 2021-08-28
  Filled 2021-08-27: qty 330

## 2021-08-27 MED ORDER — INSULIN ASPART 100 UNIT/ML IJ SOLN
6.0000 [IU] | Freq: Three times a day (TID) | INTRAMUSCULAR | Status: DC
  Administered 2021-08-27: 6 [IU] via SUBCUTANEOUS

## 2021-08-27 MED ORDER — GLUCERNA SHAKE PO LIQD: 237.0000 mL | Freq: Two times a day (BID) | ORAL | Status: DC

## 2021-08-27 MED ORDER — INSULIN GLARGINE-YFGN 100 UNIT/ML ~~LOC~~ SOLN
30.0000 [IU] | Freq: Every day | SUBCUTANEOUS | Status: DC
  Filled 2021-08-27: qty 0.3

## 2021-08-27 MED ORDER — CHLORDIAZEPOXIDE HCL 25 MG PO CAPS
25.0000 mg | ORAL_CAPSULE | Freq: Three times a day (TID) | ORAL | Status: DC
  Administered 2021-08-27: 25 mg via ORAL
  Filled 2021-08-27: qty 1

## 2021-08-27 MED ORDER — INSULIN GLARGINE-YFGN 100 UNIT/ML ~~LOC~~ SOLN
40.0000 [IU] | Freq: Every day | SUBCUTANEOUS | Status: DC
  Filled 2021-08-27: qty 0.4

## 2021-08-27 NOTE — Progress Notes (Addendum)
Inpatient Diabetes Program Recommendations  AACE/ADA: New Consensus Statement on Inpatient Glycemic Control (2015)  Target Ranges:  Prepandial:   less than 140 mg/dL      Peak postprandial:   less than 180 mg/dL (1-2 hours)      Critically ill patients:  140 - 180 mg/dL    Latest Reference Range & Units 08/26/21 09:00 08/26/21 10:06 08/26/21 11:31 08/26/21 12:12 08/26/21 17:07 08/26/21 21:37  Glucose-Capillary 70 - 99 mg/dL 106 (H) 269 (H)  30 units Semglee @1109  80 137 (H) 227 (H)  7 units Novolog  234 (H)  7 units Novolog     Latest Reference Range & Units 08/27/21 08:36  Glucose-Capillary 70 - 99 mg/dL 08/29/21 (H)  26 units Novolog   (H): Data is abnormally high   Admit with: DKA/ Sepsis from possible aspiration pneumonia/ Rhabdomyolysis   History: DM, Polysubstance Abuse, Alcohol Abuse   Home DM Meds: Novolog 6 units TID                             Lantus 50 units AM/ 30 units PM (NOT taking)                             Metformin 1000 mg BID   Current Orders: Semglee 30 QD                           Novolog Resistant Correction Scale/ SSI (0-20 units) TID AC + HS      Novolog 6 units TID with meals     MD- Note Lab glucose was 279 at 5am and then fingerstick CBG was up to 436 by 8:30am.  Question if pt was eating prior to CBG being checked this AM?  [place] to RN this AM and she told me pt had open packs of graham crackers and potato chips.  When RN told pt we would need to give him lower carb options, pt told RN to just give him more insulin because he was hungry.  Note Novolog 6 units meal coverage started this AM with breakfast  If AM CBGs continue to be elevated, please also consider increasing the Semglee to 38 units Daily (0.45 units/kg)    --Will follow patient during hospitalization--  485 RN, MSN, CDE Diabetes Coordinator Inpatient Glycemic Control Team Team Pager: [phone] (8a-5p)

## 2021-08-27 NOTE — ED Notes (Signed)
Hospitalist, MD spoke with pt about risk of leaving and instructed to drink plenty of fluids.. pt spoke with father and is suppose to pick the pt up in a couple of hours.

## 2021-08-27 NOTE — ED Notes (Signed)
This tech brought pt a pair of paper pants as pt continues to be naked. Pt stated, "I am not wearing this shit." Pt refused to wear a gown and pants.

## 2021-08-27 NOTE — Progress Notes (Signed)
Pharmacy Antibiotic Note  Andrew Buchanan is a 50 y.o. male admitted on 08/25/2021 with sepsis and acute renal failure.  Pharmacy has been consulted for Vancomycin and Cefepime dosing.  Plan: Vancomycin: Scr improving Change vancomycin to 1500 mg IV q24 hours. Goal AUC 400-550  Est AUC: 483 Calculated with SCr 1.76  Cefepime: Continue 2g IV every 12 hours for CrCl 30-78ml/min     Height: 6' (182.9 cm) Weight: 85.9 kg (189 lb 6 oz) IBW/kg (Calculated) : 77.6  Temp (24hrs), Avg:98 F (36.7 C), Min:97.9 F (36.6 C), Max:98 F (36.7 C)  Recent Labs  Lab 08/25/21 1524 08/25/21 1830 08/25/21 1925 08/25/21 2055 08/26/21 0123 08/26/21 0610 08/26/21 1113 08/26/21 1914 08/27/21 0500  WBC 16.5*  --   --   --   --   --   --   --  6.6  CREATININE 3.02*   < > 2.65*  --  2.72* 2.37* 2.29*  --  1.76*  LATICACIDVEN  --   --  1.9 1.9  --   --   --   --  1.0  VANCORANDOM  --   --   --   --   --   --   --  12  --    < > = values in this interval not displayed.     Estimated Creatinine Clearance: 55.1 mL/min (A) (by C-G formula based on SCr of 1.76 mg/dL (H)).    Allergies  Allergen Reactions   Antihistamines, Chlorpheniramine-Type Anaphylaxis    Antimicrobials this admission: 11/18 vancomycin >> 11/18 Cefepime >>  Microbiology results: 11/18 BCx: NG pending 11/18 UCx: pending   Thank you for allowing pharmacy to be a part of this patient's care.  Pricilla Riffle, PharmD, BCPS Clinical Pharmacist 08/27/2021 8:11 AM

## 2021-08-27 NOTE — ED Notes (Signed)
Pt is refusing to stay in the hospital and be treated, pt mother is on the phone, pt does not want [place] talking to his mother about his medical treatment.

## 2021-08-27 NOTE — ED Notes (Signed)
Pt is a/ox4 at present, sitter is present in the room, will continue to monitor the pt

## 2021-08-27 NOTE — Progress Notes (Signed)
Initial Nutrition Assessment  DOCUMENTATION CODES:   Not applicable  INTERVENTION:   -Glucerna Shake po BID, each supplement provides 220 kcal and 10 grams of protein  -Ensure Max po daily, each supplement provides 150 kcal and 30 grams of protein -MVI with minerals daily  NUTRITION DIAGNOSIS:   Increased nutrient needs related to acute illness as evidenced by estimated needs.  GOAL:   Patient will meet greater than or equal to 90% of their needs  MONITOR:   PO intake, Supplement acceptance, Labs, Weight trends, Skin, I & O's  REASON FOR ASSESSMENT:   Consult Assessment of nutrition requirement/status  ASSESSMENT:   Andrew Buchanan is a 50 y.o. male with medical history significant for insulin-dependent diabetes mellitus poorly controlled, polysubstance abuse, polypharmacy abuse, anxiety, alcohol abuse, history of urethrocutaneous fistula, follows with [facility] urology, who presents emergency department from home via EMS for chief concerns of being found down.  Pt admitted with rhabdomyolysis, sepsis, and DKA.   Reviewed I/O's: +1.5 L x 24 hours and +405 ml since admission  UOP: 1.6 L x 24 hours  Pt unavailable at time of visit. RD unable to obtain further nutrition-related history or complete nutrition-focused physical exam at this time.    Pt currently on a carb modified diet. No meal completions currently available to assess at this time.   Reviewed wt hx; wt has been stable over the past 7 months.   Pt has history of malnutrition per RD notes from prior hospitalization in April 2022. Suspect malnutrition is ongoing. Pt would greatly benefit from addition of oral nutrition supplements.   Medications reviewed and include folic acid, ativan, and thiamine.   Lab Results  Component Value Date   HGBA1C 6.8 (H) 08/25/2021   PTA DM medications are 1000 mg metformin BID, 20 units insulin aspart TUD with meals, 50 units insulin glargine daily, and 30 units insulin  glargine daily.   Labs reviewed: Na: 133, CBGS: 234-436 (inpatient orders for glycemic control are 0-20 units insulin aspart TID before meals and at bedtime, 6 units insulin aspart TID with meals, and 30 units insulin glargine-yfgn daily).    Diet Order:   Diet Order             Diet Carb Modified Fluid consistency: Thin; Room service appropriate? Yes  Diet effective now                   EDUCATION NEEDS:   No education needs have been identified at this time  Skin:  Skin Assessment: Reviewed RN Assessment  Last BM:  Unknown  Height:   Ht Readings from Last 1 Encounters:  08/25/21 6' (1.829 m)    Weight:   Wt Readings from Last 1 Encounters:  08/25/21 85.9 kg    Ideal Body Weight:  80.9 kg  BMI:  Body mass index is 25.68 kg/m.  Estimated Nutritional Needs:   Kcal:  2400-2600  Protein:  140-160 grams  Fluid:  > 2 L    Levada Schilling, RD, LDN, CDCES Registered Dietitian II Certified Diabetes Care and Education Specialist Please refer to [facility] for RD and/or RD on-call/weekend/after hours pager

## 2021-08-27 NOTE — ED Notes (Signed)
Pt continues to refuse admission and is actively trying to find a ride home, hospitalist is aware and is suppose to come speak with the pt about risk.

## 2021-08-27 NOTE — ED Notes (Addendum)
Per day shift RN pt spoke with MD regarding leaving AMA and day shift RN collected signed AMA paperwork. Pt was unable to contact family members to provide transport home. April, RN was able to obtain [facility] transport home and pt has left via [facility]. Pt refusing vital signs and any further medical treatment.

## 2021-08-27 NOTE — ED Notes (Signed)
Pt external Cath came off, bed linens changed pt was able to get up with 1 person assist, cleaned pt/pt back in bed resting, pt has stated that he can't sleep and is restless.

## 2021-08-27 NOTE — ED Notes (Signed)
Pt has a blister to the right calf that pt is unaware how it got, it is approximately 4in in diameter in a circular shape

## 2021-08-27 NOTE — ED Notes (Signed)
CBG 436, MD notified, awaiting a response,  Sitter reports pt drinking a lot of water this morning, VSS, will continue to monitor the pt.

## 2021-08-27 NOTE — ED Notes (Signed)
Pt put pants on , Is ambulatory in the room with a steady gait at this time, pt continues to wait for his father to pick him, pt is sitting at the bedside eating lunch meal

## 2021-08-27 NOTE — Discharge Summary (Incomplete)
Physician Discharge Summary  Andrew Buchanan MLY:[id number] DOB: 07-02-1971 DOA: 08/25/2021  PCP: System, Provider Not In  Admit date: 08/25/2021 Discharge date: 08/27/2021  AMA DISCHARGE - pt signed himself out AGAINST MEDICAL ADVICE despite ongoing hyperglycemia high risk for recurrent DKA and renal failure with rhabdomyolysis.  He was fully alert and oriented, and verbalized understanding the risks of progressive illness, readmission to the hospital and possibly death.  Admitted From: *** Disposition:  ***  Recommendations for Outpatient Follow-up:  Follow up with PCP in 1-2 weeks Please obtain BMP/CBC in one week Please follow up on the following pending results: ***  Home Health: ***  Equipment/Devices: ***   Discharge Condition: ***  CODE STATUS: ***  Diet recommendation: *** Heart Healthy / Carb Modified / Regular / Dysphagia   Discharge Diagnoses: Principal Problem:   Rhabdomyolysis Active Problems:   Alcohol abuse   Bipolar disorder, unspecified (HCC)   DKA (diabetic ketoacidosis) (HCC)   Noncompliance with medications   Sepsis with acute renal failure without septic shock (HCC)   AKI (acute kidney injury) (HCC)   Polysubstance abuse (HCC)   Anxiety   History of insulin dependent diabetes mellitus   Elevated troponin   Primary hypertension   History of bacteremia    Summary of HPI and Hospital Course:  No notes on file   ***   Discharge Instructions ***   Allergies as of 08/27/2021       Reactions   Antihistamines, Chlorpheniramine-type Anaphylaxis     Med Rec must be completed prior to using this SMARTLINK***       Allergies  Allergen Reactions   Antihistamines, Chlorpheniramine-Type Anaphylaxis     If you experience worsening of your admission symptoms, develop shortness of breath, life threatening emergency, suicidal or homicidal thoughts you must seek medical attention immediately by calling 911 or calling your MD immediately   if symptoms less severe.    Please note   You were cared for by a hospitalist during your hospital stay. If you have any questions about your discharge medications or the care you received while you were in the hospital after you are discharged, you can call the unit and asked to speak with the hospitalist on call if the hospitalist that took care of you is not available. Once you are discharged, your primary care physician will handle any further medical issues. Please note that NO REFILLS for any discharge medications will be authorized once you are discharged, as it is imperative that you return to your primary care physician (or establish a relationship with a primary care physician if you do not have one) for your aftercare needs so that they can reassess your need for medications and monitor your lab values.   Consultations: ***    Procedures/Studies: CT HEAD WO CONTRAST ( )  Result Date: 08/25/2021 CLINICAL DATA:  Pt was found face down in upstairs bedroom at home. Mental status change, unknown cause EXAM: CT HEAD WITHOUT CONTRAST TECHNIQUE: Contiguous axial images were obtained from the base of the skull through the vertex without intravenous contrast. COMPARISON:  CT head 01/05/2021 BRAIN: BRAIN Cerebral ventricle sizes are concordant with the degree of cerebral volume loss. Patchy and confluent areas of decreased attenuation are noted throughout the deep and periventricular white matter of the cerebral hemispheres bilaterally, compatible with chronic microvascular ischemic disease. No evidence of large-territorial acute infarction. No parenchymal hemorrhage. No mass lesion. No extra-axial collection. No mass effect or midline shift. No hydrocephalus. Basilar cisterns are patent. Vascular:  No hyperdense vessel. Skull: No acute fracture or focal lesion. Sinuses/Orbits: Paranasal sinuses and mastoid air cells are clear. The orbits are unremarkable. Other: None. IMPRESSION: No acute  intracranial abnormality. Electronically Signed   By: Tish Frederickson M.D.   On: 08/25/2021 16:22   CT FEMUR RIGHT WO CONTRAST  Result Date: 08/25/2021 CLINICAL DATA:  Soft tissue mass. Thigh. Large right contusion with swelling of right lateral thigh. EXAM: CT OF THE LOWER RIGHT EXTREMITY WITHOUT CONTRAST TECHNIQUE: Multidetector CT imaging of the right lower extremity was performed according to the standard protocol. COMPARISON:  None. FINDINGS: Bones/Joint/Cartilage Right femoral head avascular necrosis. No acute displaced fracture. No dislocation. Trace right hip effusion (8:64, 9:95, 5:63). Ligaments Suboptimally assessed by CT. Muscles and Tendons Unremarkable. Soft tissues Mild subcutaneus soft tissue edema along the proximal posterior medial thigh/gluteal regions. Subcutaneus soft tissue edema along the lateral mid to distal thigh (5: 158-255). No associated mass, abscess formed should, large hematoma formation. No subcutaneus soft tissue emphysema. Other: Visualized pelvis demonstrates diffuse sigmoid diverticulosis. Decompressed urinary bladder with marked circumferential urinary bladder wall thickening. Vascular calcifications. IMPRESSION: 1. Subcutaneus soft tissue edema along the lateral mid to distal thigh. No associated mass, abscess formed should, large hematoma formation. 2. Trace right hip effusion. 3. Right femoral head avascular necrosis. Electronically Signed   By: Tish Frederickson M.D.   On: 08/25/2021 21:42   DG Chest Portable 1 View  Result Date: 08/25/2021 CLINICAL DATA:  Altered mental status, found face down EXAM: PORTABLE CHEST 1 VIEW COMPARISON:  01/05/2021 FINDINGS: Cardiac size is within normal limits. Thoracic aorta is tortuous and ectatic. There are no signs of pulmonary edema. Linear densities in the right lower lung fields suggest subsegmental atelectasis. There is no pleural effusion or pneumothorax. IMPRESSION: Linear densities in the right lower lung fields suggest  subsegmental atelectasis. There are no signs of pulmonary edema or focal pulmonary consolidation. Electronically Signed   By: Ernie Avena M.D.   On: 08/25/2021 16:29     ***  (Echo, Carotid, EGD, Colonoscopy, ERCP)    Subjective: ***   Discharge Exam: Vitals:   08/27/21 1300 08/27/21 1400  BP: 113/77   Pulse:    Resp: 17   Temp:    SpO2:  98%   Vitals:   08/27/21 1000 08/27/21 1245 08/27/21 1300 08/27/21 1400  BP: (!) 143/79 137/83 113/77   Pulse: 84     Resp: Temp:      TempSrc:      SpO2: 95%   98%  Weight:      Height:        General: Pt is alert, awake, not in acute distress Cardiovascular: ***RRR, S1/S2 +, no rubs, no gallops Respiratory: CTA bilaterally, no wheezing, no rhonchi Abdominal: Soft, NT, ND, bowel sounds + Extremities: ***no edema, no cyanosis    The results of significant diagnostics from this hospitalization (including imaging, microbiology, ancillary and laboratory) are listed below for reference.     Microbiology: Recent Results (from the past 240 hour(s))  Resp Panel by RT-PCR (Flu A&B, Covid) Nasopharyngeal Swab     Status: None   Collection Time: 08/25/21  4:43 PM   Specimen: Nasopharyngeal Swab; Nasopharyngeal(NP) swabs in vial transport medium  Result Value Ref Range Status   SARS Coronavirus 2 by RT PCR NEGATIVE NEGATIVE Final    Comment: (NOTE) SARS-CoV-2 target nucleic acids are NOT DETECTED.  The SARS-CoV-2 RNA is generally detectable in upper respiratory specimens during the acute  phase of infection. The lowest concentration of SARS-CoV-2 viral copies this assay can detect is 138 copies/mL. A negative result does not preclude SARS-Cov-2 infection and should not be used as the sole basis for treatment or other patient management decisions. A negative result may occur with  improper specimen collection/handling, submission of specimen other than nasopharyngeal swab, presence of viral mutation(s) within  the areas targeted by this assay, and inadequate number of viral copies(<138 copies/mL). A negative result must be combined with clinical observations, patient history, and epidemiological information. The expected result is Negative.  Fact Sheet for Patients:  BloggerCourse.com  Fact Sheet for Healthcare Providers:  SeriousBroker.it  This test is no t yet approved or cleared by the [place] FDA and  has been authorized for detection and/or diagnosis of SARS-CoV-2 by FDA under an Emergency Use Authorization (EUA). This EUA will remain  in effect (meaning this test can be used) for the duration of the COVID-19 declaration under Section 564(b)(1) of the Act, 21 U.S.C.section 360bbb-3(b)(1), unless the authorization is terminated  or revoked sooner.       Influenza A by PCR NEGATIVE NEGATIVE Final   Influenza B by PCR NEGATIVE NEGATIVE Final    Comment: (NOTE) The Xpert Xpress SARS-CoV-2/FLU/RSV plus assay is intended as an aid in the diagnosis of influenza from Nasopharyngeal swab specimens and should not be used as a sole basis for treatment. Nasal washings and aspirates are unacceptable for Xpert Xpress SARS-CoV-2/FLU/RSV testing.  Fact Sheet for Patients: BloggerCourse.com  Fact Sheet for Healthcare Providers: SeriousBroker.it  This test is not yet approved or cleared by the [place] FDA and has been authorized for detection and/or diagnosis of SARS-CoV-2 by FDA under an Emergency Use Authorization (EUA). This EUA will remain in effect (meaning this test can be used) for the duration of the COVID-19 declaration under Section 564(b)(1) of the Act, 21 U.S.C. section 360bbb-3(b)(1), unless the authorization is terminated or revoked.  Performed at [facility], [address] [place], [place] [address]   Blood culture (routine x 2)     Status: None  (Preliminary result)   Collection Time: 08/25/21  6:31 PM   Specimen: BLOOD  Result Value Ref Range Status   Specimen Description BLOOD RIGHT ANTECUBITAL  Final   Special Requests   Final    BOTTLES DRAWN AEROBIC AND ANAEROBIC Blood Culture adequate volume   Culture   Final    NO GROWTH 2 DAYS Performed at [facility], [address], [place], [place] [address]    Report Status PENDING  Incomplete  Blood culture (routine x 2)     Status: None (Preliminary result)   Collection Time: 08/25/21  7:25 PM   Specimen: BLOOD  Result Value Ref Range Status   Specimen Description BLOOD RIGHT ANTECUBITAL  Final   Special Requests   Final    BOTTLES DRAWN AEROBIC ONLY Blood Culture results may not be optimal due to an inadequate volume of blood received in culture bottles   Culture   Final    NO GROWTH 2 DAYS Performed at [facility], [address] [place], [place] [address]    Report Status PENDING  Incomplete  Urine Culture     Status: Abnormal (Preliminary result)   Collection Time: 08/25/21  7:25 PM   Specimen: Urine, Clean Catch  Result Value Ref Range Status   Specimen Description   Final    URINE, CLEAN CATCH Performed at [facility], [address]  [place], [place] [address]    Special Requests   Final    NONE Performed at [facility], [address] [place], [place] [address]    Culture (A)  Final    >=100,000 COLONIES/mL KLEBSIELLA PNEUMONIAE SUSCEPTIBILITIES TO FOLLOW Performed at [facility], [address] [place], [place] [address]    Report Status PENDING  Incomplete  Respiratory (~20 pathogens) panel by PCR     Status: None   Collection Time: 08/26/21  4:06 AM   Specimen: Nasopharyngeal Swab; Respiratory  Result Value Ref Range Status   Adenovirus NOT DETECTED NOT DETECTED Final   Coronavirus 229E NOT DETECTED NOT DETECTED Final    Comment: (NOTE) The Coronavirus on the Respiratory Panel, DOES NOT test for  the novel  Coronavirus (2019 nCoV)    Coronavirus HKU1 NOT DETECTED NOT DETECTED Final   Coronavirus NL63 NOT DETECTED NOT DETECTED Final   Coronavirus OC43 NOT DETECTED NOT DETECTED Final   Metapneumovirus NOT DETECTED NOT DETECTED Final   Rhinovirus / Enterovirus NOT DETECTED NOT DETECTED Final   Influenza A NOT DETECTED NOT DETECTED Final   Influenza B NOT DETECTED NOT DETECTED Final   Parainfluenza Virus 1 NOT DETECTED NOT DETECTED Final   Parainfluenza Virus 2 NOT DETECTED NOT DETECTED Final   Parainfluenza Virus 3 NOT DETECTED NOT DETECTED Final   Parainfluenza Virus 4 NOT DETECTED NOT DETECTED Final   Respiratory Syncytial Virus NOT DETECTED NOT DETECTED Final   Bordetella pertussis NOT DETECTED NOT DETECTED Final   Bordetella Parapertussis NOT DETECTED NOT DETECTED Final   Chlamydophila pneumoniae NOT DETECTED NOT DETECTED Final   Mycoplasma pneumoniae NOT DETECTED NOT DETECTED Final    Comment: Performed at [facility], [address], [place], [place] [address]     Labs: BNP (last 3 results) No results for input(s): BNP in the last 8760 hours. Basic Metabolic Panel: Recent Labs  Lab 08/25/21 1830 08/25/21 1925 08/26/21 0123 08/26/21 0406 08/26/21 0610 08/26/21 1113 08/27/21 0500  NA 133* 135 138  --  135 135 133*  K 5.6* 4.2 4.4  --  3.8 3.7 3.7  CL 95* 99 103  --  103 102 103  CO2 18* 20* 22  --  23 25 23   GLUCOSE 538* 367* 216*  --  178* 95 279*  BUN 71* 69* 67*  --  65* 59* 42*  CREATININE 2.88* 2.65* 2.72*  --  2.37* 2.29* 1.76*  CALCIUM 10.4* 10.2 10.1  --  9.6 9.3 8.6*  MG 2.2  --   --  1.5*  --   --  2.0  PHOS 3.9  --   --  2.6  --   --   --    Liver Function Tests: Recent Labs  Lab 08/25/21 1524  AST 79*  ALT 50*  ALKPHOS 92  BILITOT 2.2*  PROT 9.9*  ALBUMIN 4.9   No results for input(s): LIPASE, AMYLASE in the last 168 hours. No results for input(s): AMMONIA in the last 168 hours. CBC: Recent Labs  Lab 08/25/21 1524  08/27/21 0500  WBC 16.5* 6.6  HGB 16.1 11.5*  HCT 45.3 33.5*  MCV 103.2* 104.7*  PLT 191 97*   Cardiac Enzymes: Recent Labs  Lab 08/25/21 1524 08/26/21 0406 08/27/21 0846  CKTOTAL 3,944* 3,902* 1,923*   BNP: Invalid input(s): POCBNP CBG: Recent Labs  Lab 08/26/21 1707 08/26/21 2137 08/27/21 0836 08/27/21 1007 08/27/21 1222  GLUCAP 227* 234* 436* 412* 212*   D-Dimer  No results for input(s): DDIMER in the last 72 hours. Hgb A1c Recent Labs    08/25/21 1524  HGBA1C 6.8*   Lipid Profile No results for input(s): CHOL, HDL, LDLCALC, TRIG, CHOLHDL, LDLDIRECT in the last 72 hours. Thyroid function studies No results for input(s): TSH, T4TOTAL, T3FREE, THYROIDAB in the last 72 hours.  Invalid input(s): FREET3 Anemia work up Recent Labs    08/25/21 1925  VITAMINB12 758   Urinalysis    Component Value Date/Time   COLORURINE YELLOW (A) 08/25/2021 1925   APPEARANCEUR HAZY (A) 08/25/2021 1925   APPEARANCEUR Clear 08/18/2014 1318   LABSPEC 1.018 08/25/2021 1925   LABSPEC 1.024 08/18/2014 1318   PHURINE 5.0 08/25/2021 1925   GLUCOSEU >=500 (A) 08/25/2021 1925   GLUCOSEU Negative 08/18/2014 1318   HGBUR LARGE (A) 08/25/2021 1925   BILIRUBINUR NEGATIVE 08/25/2021 1925   BILIRUBINUR Negative 08/18/2014 1318   KETONESUR 20 (A) 08/25/2021 1925   PROTEINUR 100 (A) 08/25/2021 1925   NITRITE NEGATIVE 08/25/2021 1925   LEUKOCYTESUR NEGATIVE 08/25/2021 1925   LEUKOCYTESUR Negative 08/18/2014 1318   Sepsis Labs Invalid input(s): PROCALCITONIN,  WBC,  LACTICIDVEN Microbiology Recent Results (from the past 240 hour(s))  Resp Panel by RT-PCR (Flu A&B, Covid) Nasopharyngeal Swab     Status: None   Collection Time: 08/25/21  4:43 PM   Specimen: Nasopharyngeal Swab; Nasopharyngeal(NP) swabs in vial transport medium  Result Value Ref Range Status   SARS Coronavirus 2 by RT PCR NEGATIVE NEGATIVE Final    Comment: (NOTE) SARS-CoV-2 target nucleic acids are NOT  DETECTED.  The SARS-CoV-2 RNA is generally detectable in upper respiratory specimens during the acute phase of infection. The lowest concentration of SARS-CoV-2 viral copies this assay can detect is 138 copies/mL. A negative result does not preclude SARS-Cov-2 infection and should not be used as the sole basis for treatment or other patient management decisions. A negative result may occur with  improper specimen collection/handling, submission of specimen other than nasopharyngeal swab, presence of viral mutation(s) within the areas targeted by this assay, and inadequate number of viral copies(<138 copies/mL). A negative result must be combined with clinical observations, patient history, and epidemiological information. The expected result is Negative.  Fact Sheet for Patients:  BloggerCourse.com  Fact Sheet for Healthcare Providers:  SeriousBroker.it  This test is no t yet approved or cleared by the [place] FDA and  has been authorized for detection and/or diagnosis of SARS-CoV-2 by FDA under an Emergency Use Authorization (EUA). This EUA will remain  in effect (meaning this test can be used) for the duration of the COVID-19 declaration under Section 564(b)(1) of the Act, 21 U.S.C.section 360bbb-3(b)(1), unless the authorization is terminated  or revoked sooner.       Influenza A by PCR NEGATIVE NEGATIVE Final   Influenza B by PCR NEGATIVE NEGATIVE Final    Comment: (NOTE) The Xpert Xpress SARS-CoV-2/FLU/RSV plus assay is intended as an aid in the diagnosis of influenza from Nasopharyngeal swab specimens and should not be used as a sole basis for treatment. Nasal washings and aspirates are unacceptable for Xpert Xpress SARS-CoV-2/FLU/RSV testing.  Fact Sheet for Patients: BloggerCourse.com  Fact Sheet for Healthcare Providers: SeriousBroker.it  This test is not yet  approved or cleared by the [place] FDA and has been authorized for detection and/or diagnosis of SARS-CoV-2 by FDA under an Emergency Use Authorization (EUA). This EUA will remain in effect (meaning this test can be used) for the duration of the COVID-19 declaration under  Section 564(b)(1) of the Act, 21 U.S.C. section 360bbb-3(b)(1), unless the authorization is terminated or revoked.  Performed at [facility], [address] [place], [place] [address]   Blood culture (routine x 2)     Status: None (Preliminary result)   Collection Time: 08/25/21  6:31 PM   Specimen: BLOOD  Result Value Ref Range Status   Specimen Description BLOOD RIGHT ANTECUBITAL  Final   Special Requests   Final    BOTTLES DRAWN AEROBIC AND ANAEROBIC Blood Culture adequate volume   Culture   Final    NO GROWTH 2 DAYS Performed at [facility], [address], [place], [place] [address]    Report Status PENDING  Incomplete  Blood culture (routine x 2)     Status: None (Preliminary result)   Collection Time: 08/25/21  7:25 PM   Specimen: BLOOD  Result Value Ref Range Status   Specimen Description BLOOD RIGHT ANTECUBITAL  Final   Special Requests   Final    BOTTLES DRAWN AEROBIC ONLY Blood Culture results may not be optimal due to an inadequate volume of blood received in culture bottles   Culture   Final    NO GROWTH 2 DAYS Performed at [facility], [address], [place], [place] [address]    Report Status PENDING  Incomplete  Urine Culture     Status: Abnormal (Preliminary result)   Collection Time: 08/25/21  7:25 PM   Specimen: Urine, Clean Catch  Result Value Ref Range Status   Specimen Description   Final    URINE, CLEAN CATCH Performed at [facility], [address] [place], [place] [address]    Special Requests   Final    NONE Performed at [facility], [address] [place], [place] [address]    Culture (A)  Final    >=100,000  COLONIES/mL KLEBSIELLA PNEUMONIAE SUSCEPTIBILITIES TO FOLLOW Performed at [facility], [address] [place], [place] [address]    Report Status PENDING  Incomplete  Respiratory (~20 pathogens) panel by PCR     Status: None   Collection Time: 08/26/21  4:06 AM   Specimen: Nasopharyngeal Swab; Respiratory  Result Value Ref Range Status   Adenovirus NOT DETECTED NOT DETECTED Final   Coronavirus 229E NOT DETECTED NOT DETECTED Final    Comment: (NOTE) The Coronavirus on the Respiratory Panel, DOES NOT test for the novel  Coronavirus (2019 nCoV)    Coronavirus HKU1 NOT DETECTED NOT DETECTED Final   Coronavirus NL63 NOT DETECTED NOT DETECTED Final   Coronavirus OC43 NOT DETECTED NOT DETECTED Final   Metapneumovirus NOT DETECTED NOT DETECTED Final   Rhinovirus / Enterovirus NOT DETECTED NOT DETECTED Final   Influenza A NOT DETECTED NOT DETECTED Final   Influenza B NOT DETECTED NOT DETECTED Final   Parainfluenza Virus 1 NOT DETECTED NOT DETECTED Final   Parainfluenza Virus 2 NOT DETECTED NOT DETECTED Final   Parainfluenza Virus 3 NOT DETECTED NOT DETECTED Final   Parainfluenza Virus 4 NOT DETECTED NOT DETECTED Final   Respiratory Syncytial Virus NOT DETECTED NOT DETECTED Final   Bordetella pertussis NOT DETECTED NOT DETECTED Final   Bordetella Parapertussis NOT DETECTED NOT DETECTED Final   Chlamydophila pneumoniae NOT DETECTED NOT DETECTED Final   Mycoplasma pneumoniae NOT DETECTED NOT DETECTED Final    Comment: Performed at [facility], [address] [place], [place] [address]     Time coordinating discharge: Over 30 minutes  SIGNED:  Pennie Banter, DO [facility] 08/27/2021, 4:11 PM   If 7PM-7AM, please contact night-coverage [email or web address]

## 2021-08-27 NOTE — ED Notes (Signed)
Pt has made 3 more attempts to contact his father after speaking with him earlier today with no success.

## 2021-08-27 NOTE — ED Notes (Signed)
Taxi service called and they are closed for the night, pt informed and very upset . Pt has called all of his family and now they are not answering

## 2021-08-28 LAB — URINE CULTURE: Culture: >=100000 — AB

## 2021-08-29 LAB — VITAMIN B1: Vitamin B1 (Thiamine): 154.4 nmol/L (ref 66.5–200.0)

## 2021-08-30 LAB — CULTURE, BLOOD (ROUTINE X 2)
Culture: NO GROWTH
Culture: NO GROWTH
Special Requests: ADEQUATE

## 2021-09-11 LAB — BLOOD GAS, VENOUS
Acid-base deficit: 5.2 mmol/L — ABNORMAL HIGH (ref 0.0–2.0)
Bicarbonate: 20.6 mmol/L (ref 20.0–28.0)
O2 Saturation: 44 %
Patient temperature: 37
pCO2, Ven: 40 mmHg — ABNORMAL LOW (ref 44.0–60.0)
pH, Ven: 7.32 (ref 7.250–7.430)

## 2021-11-13 DIAGNOSIS — E11621 Type 2 diabetes mellitus with foot ulcer: Secondary | ICD-10-CM

## 2021-11-14 DIAGNOSIS — E11621 Type 2 diabetes mellitus with foot ulcer: Secondary | ICD-10-CM | POA: Diagnosis present

## 2022-11-22 IMAGING — CT CT CERVICAL SPINE W/O CM
3 of 4 series · 13 of 33 positions shown, 16 images · non-contrast
Comparison: Remote CT 04/29/2008

CLINICAL DATA: Neck trauma, intoxicated or obtunded (Age >= 16y)

EXAM:
CT CERVICAL SPINE WITHOUT CONTRAST
TECHNIQUE: Multidetector CT imaging of the cervical spine was performed without
intravenous contrast. Multiplanar CT image reconstructions were also
generated.

[Series 4: sagittal bone · sagittal · 0.30mm/px · 5 of 56 slices shown, 6 images]
[im 19/56  bone]
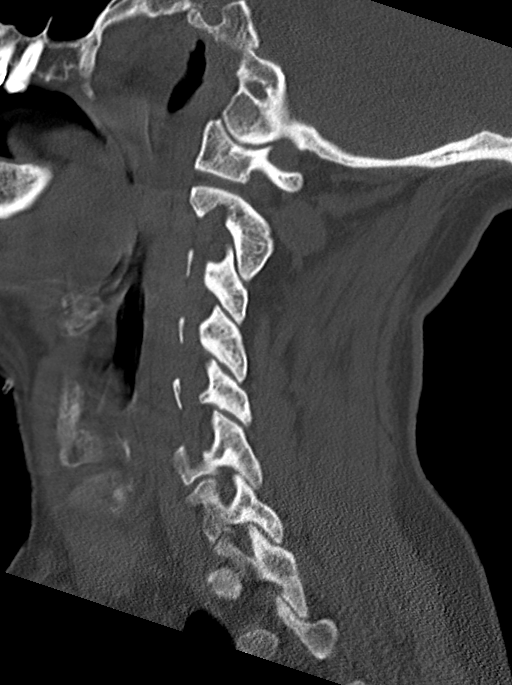
[im 23/56  bone]
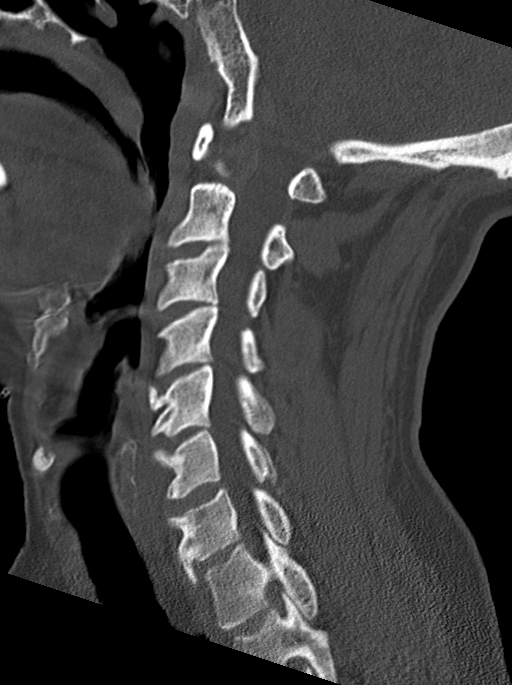
[im 28/56  soft-tissue]
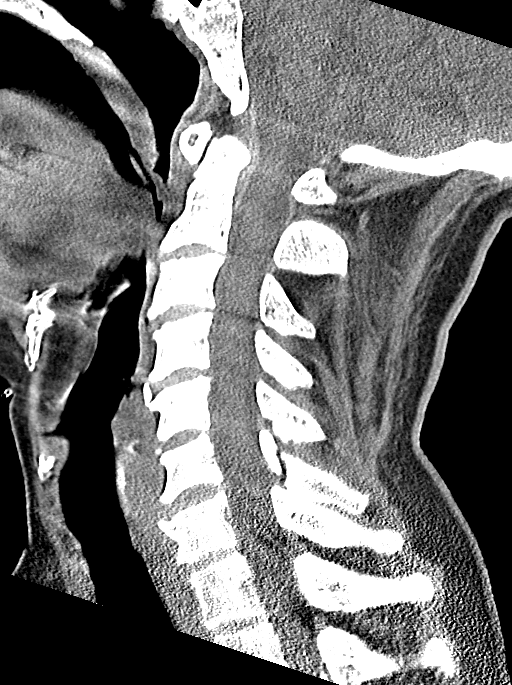
[im 28/56  bone]
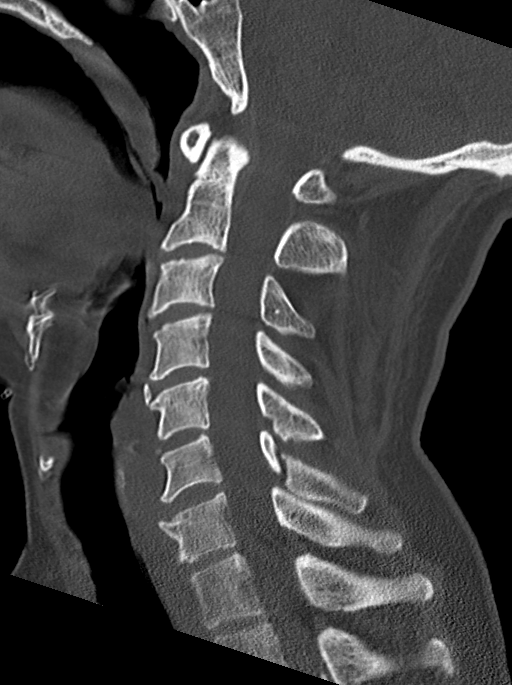
[im 33/56  bone]
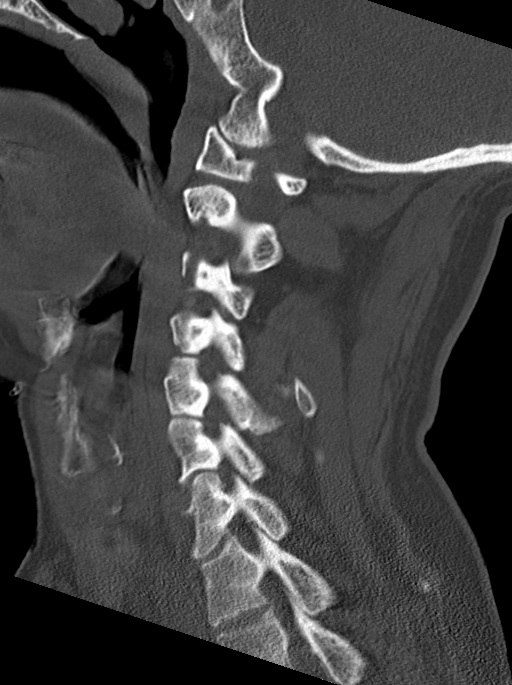
[im 37/56  bone]
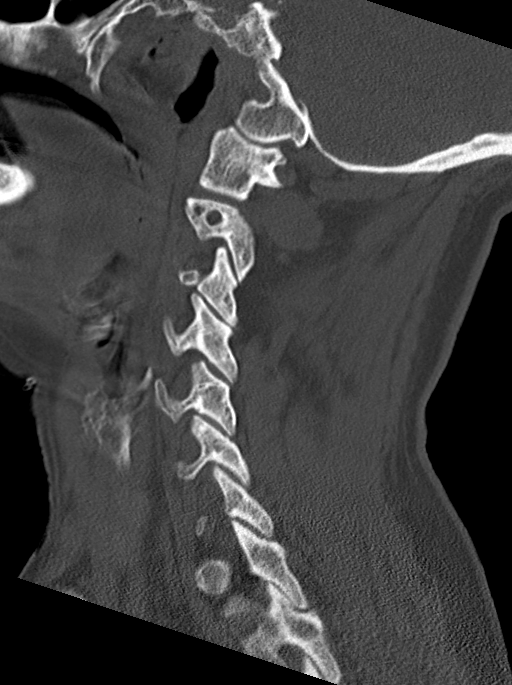

[Series 5: coronal bone · coronal · 0.26mm/px · 3 of 55 slices shown]
[im 11/55  bone]
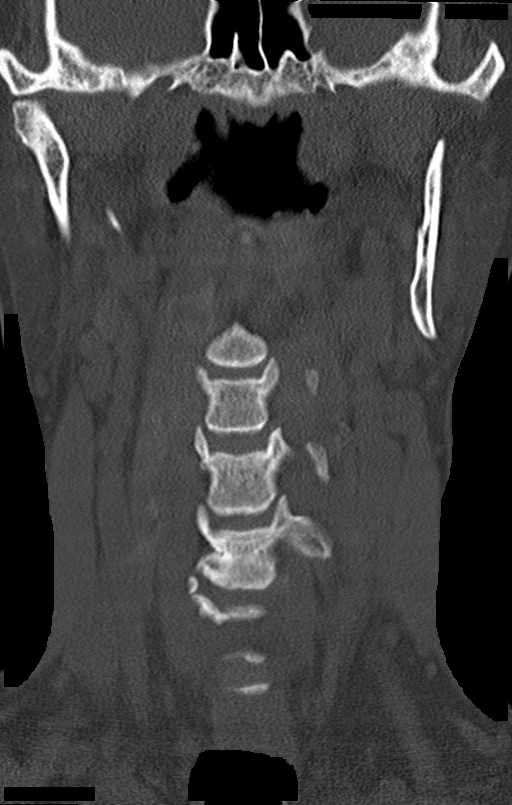
[im 22/55  bone]
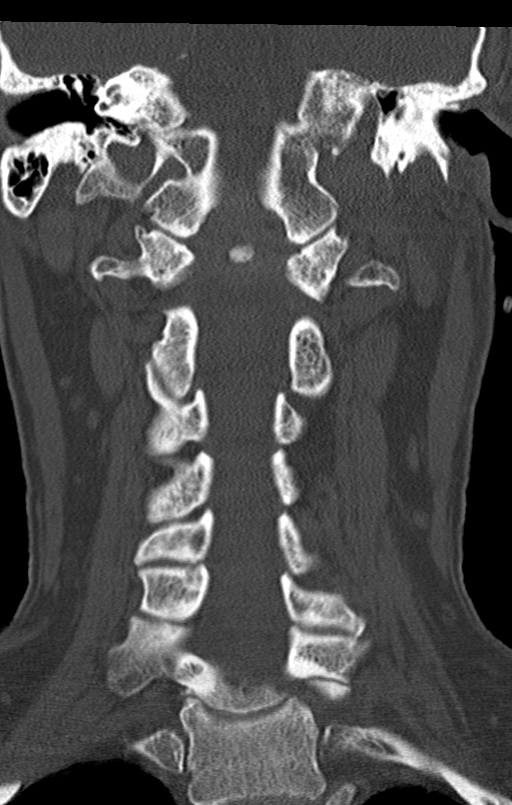
[im 33/55  bone]
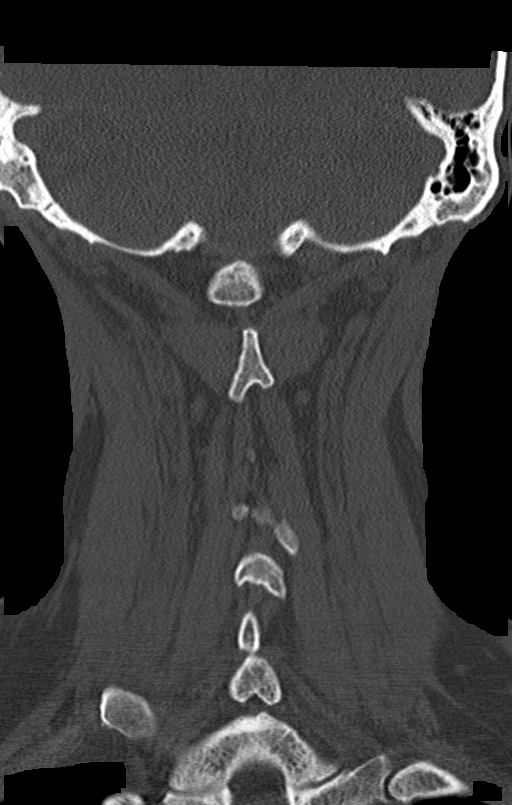

[Series 6: orthogonal bone · axial · 0.24mm/px · z∈[+46,+174]mm · 5 of 100 slices shown, 7 images]
[im 17/100  soft-tissue]
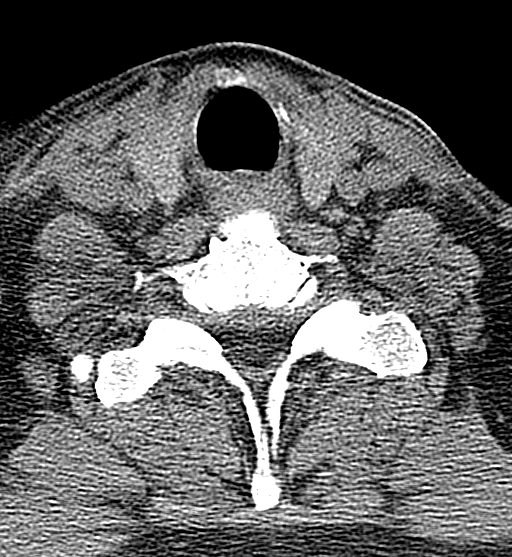
[im 17/100  bone]
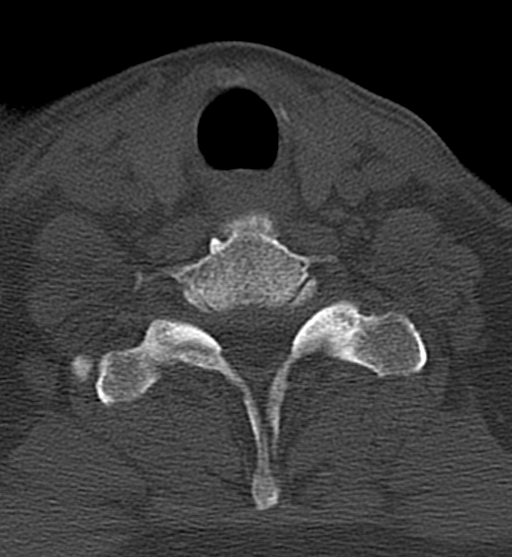
[im 34/100  bone]
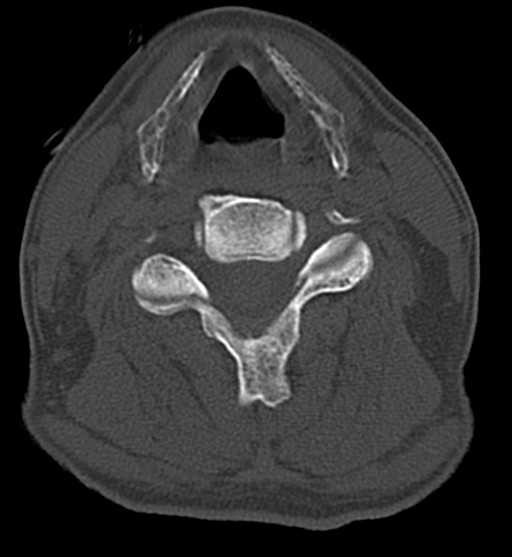
[im 50/100  bone]
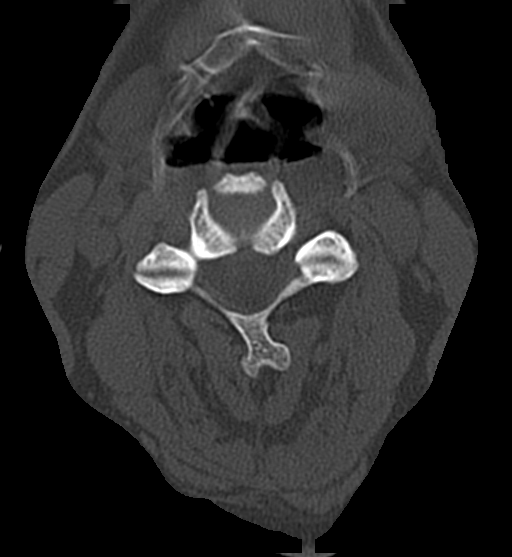
[im 67/100  bone]
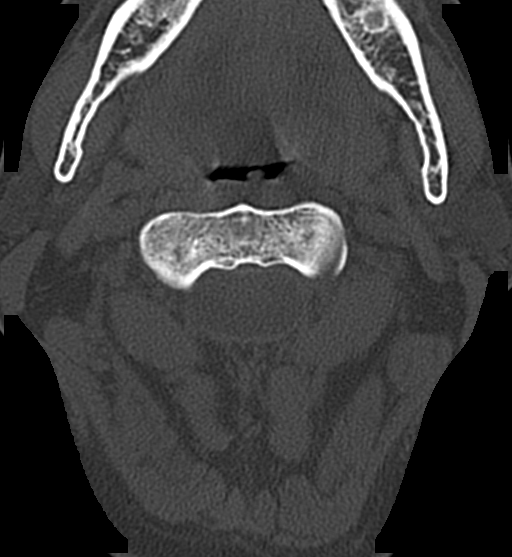
[im 83/100  soft-tissue]
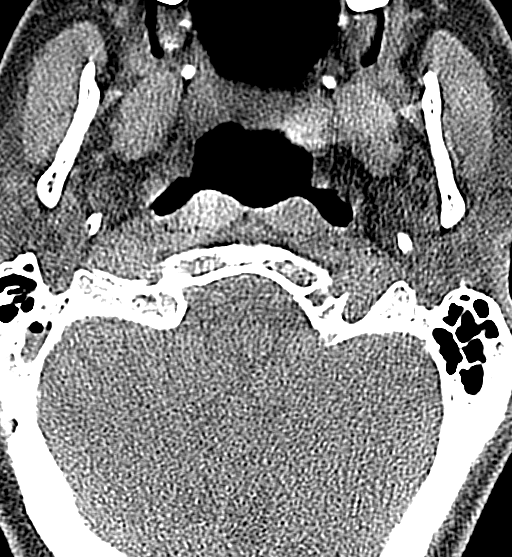
[im 83/100  bone]
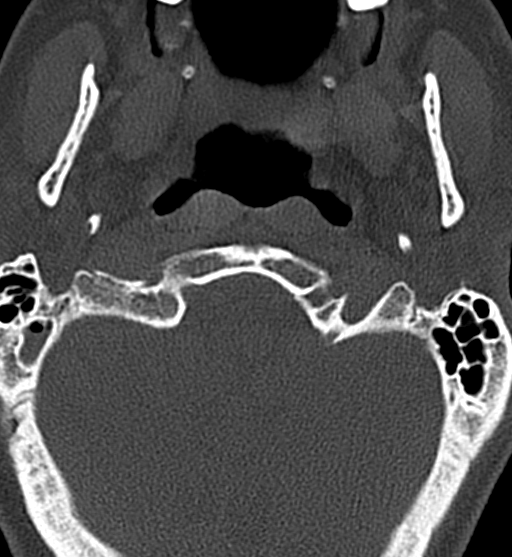

[13 of 33 positions shown; findings below may reference images not displayed]

FINDINGS: Alignment: Normal.

Skull base and vertebrae: Fracture through C6 spinous process has
well corticated margins and is chronic, suggestive of nonunion. This
is new from 6664. No acute fracture. The dens and skull base are
intact

Soft tissues and spinal canal: No prevertebral fluid or swelling. No
visible canal hematoma.

Disc levels: Endplate spurring at multiple levels. Mild C4-C5 and
C5-C6 disc space narrowing.

Upper chest: No acute or unexpected findings.

Other: None.
IMPRESSION: 1. No acute fracture or subluxation of the cervical spine.
2. Fracture through C6 spinous process is chronic, suggestive of
nonunion.

## 2022-11-22 IMAGING — CT CT HEAD W/O CM
3 series · 16 of 47 positions shown, 19 images · non-contrast
Comparison: Remote head CT 04/29/2008

CLINICAL DATA: Mental status change.

EXAM:
CT HEAD WITHOUT CONTRAST
TECHNIQUE: Contiguous axial images were obtained from the base of the skull
through the vertex without intravenous contrast.

[Series 3: head wo · axial · 0.43mm/px · z∈[+201,+331]mm · 10 of 32 slices shown, 13 images]
[im 3/32  brain]
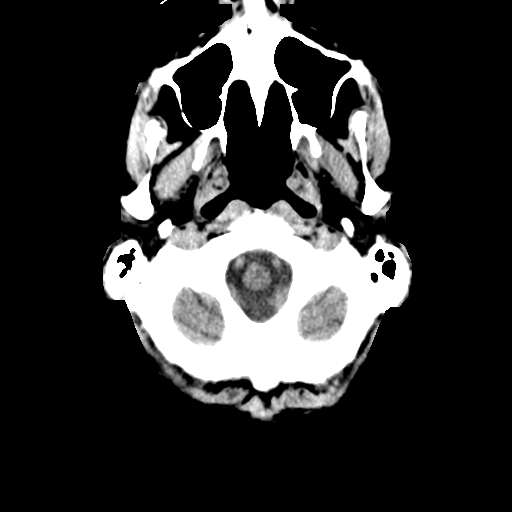
[im 3/32  bone]
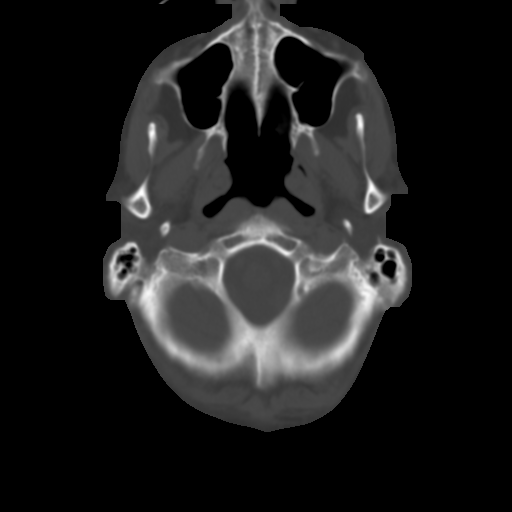
[im 6/32  brain]
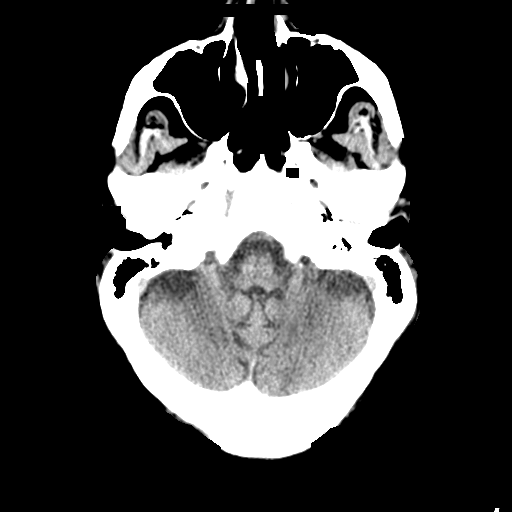
[im 9/32  brain]
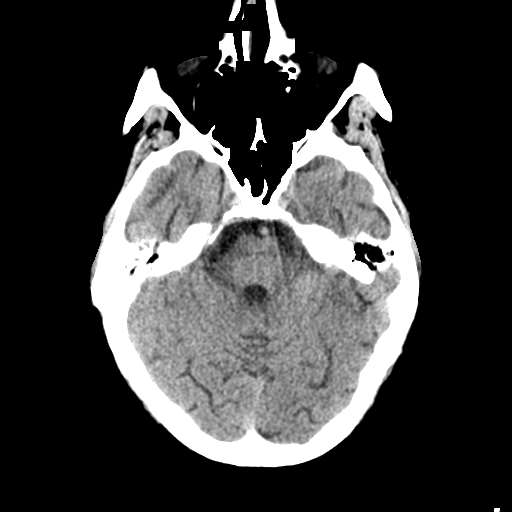
[im 11/32  brain]
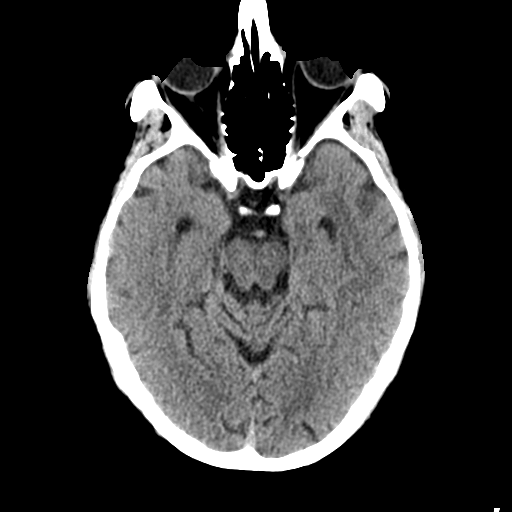
[im 14/32  brain]
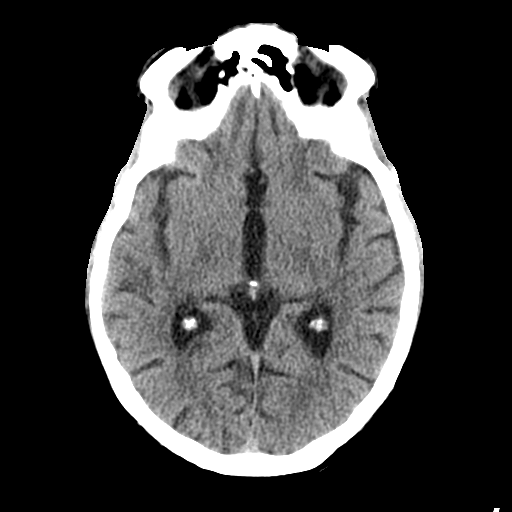
[im 14/32  bone]
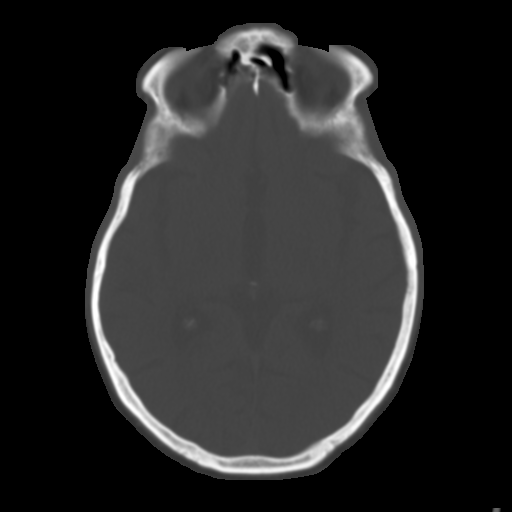
[im 18/32  brain]
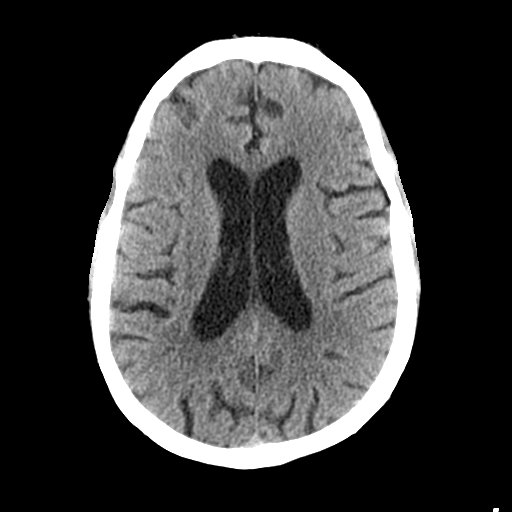
[im 21/32  brain]
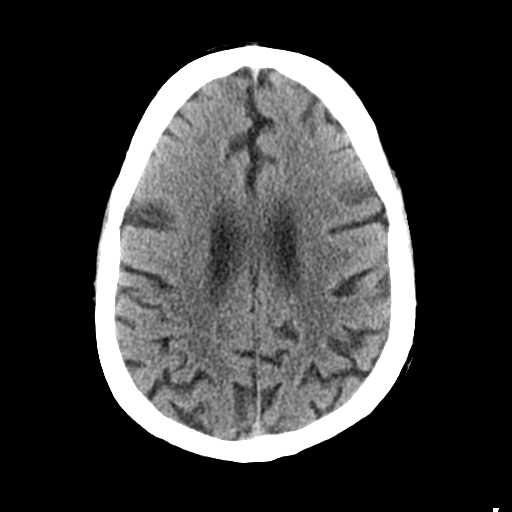
[im 24/32  brain]
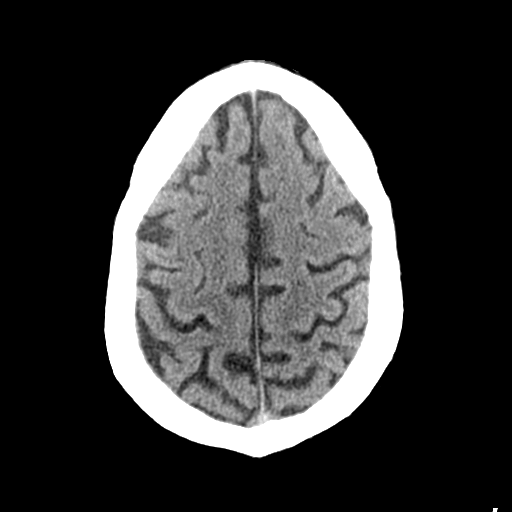
[im 26/32  brain]
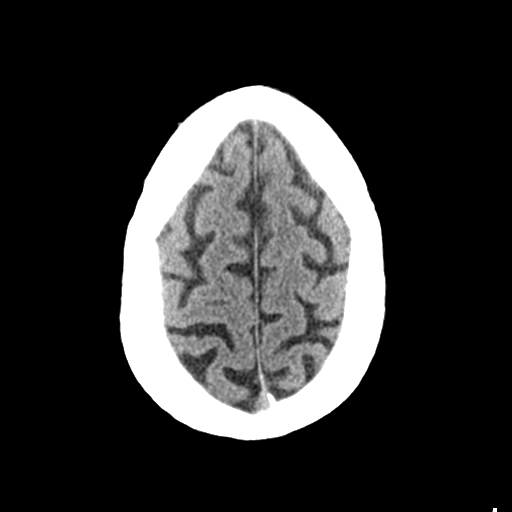
[im 26/32  bone]
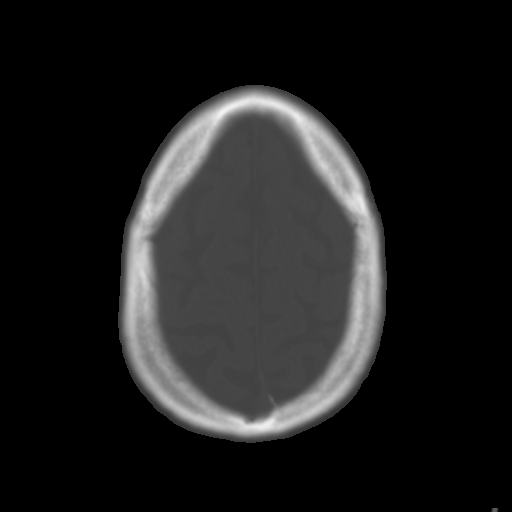
[im 29/32  brain]
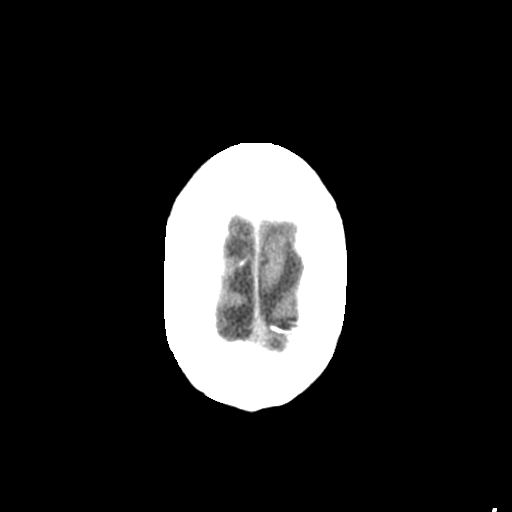

[Series 4: coronal soft tissue · coronal · 0.33mm/px · 3 of 71 slices shown]
[im 24/71  brain]
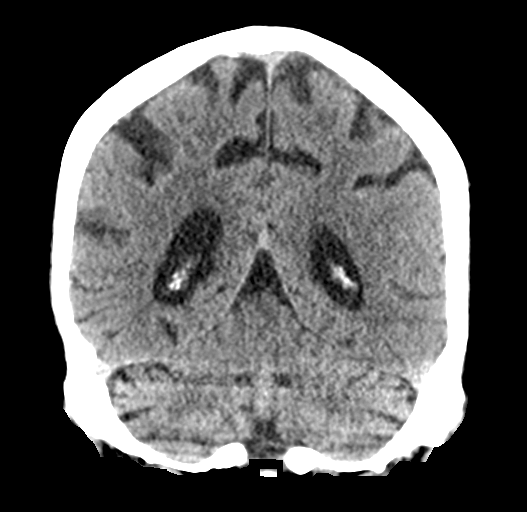
[im 32/71  brain]
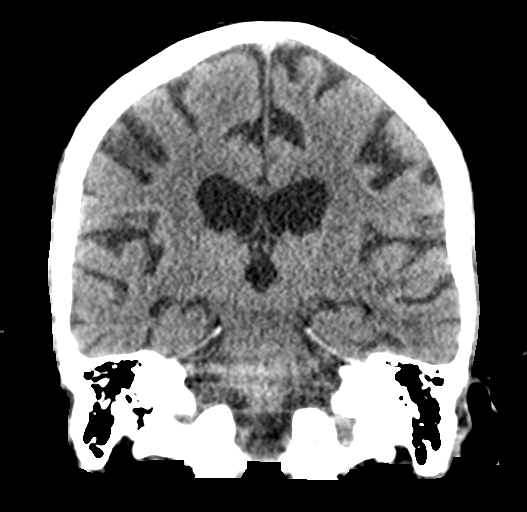
[im 39/71  brain]
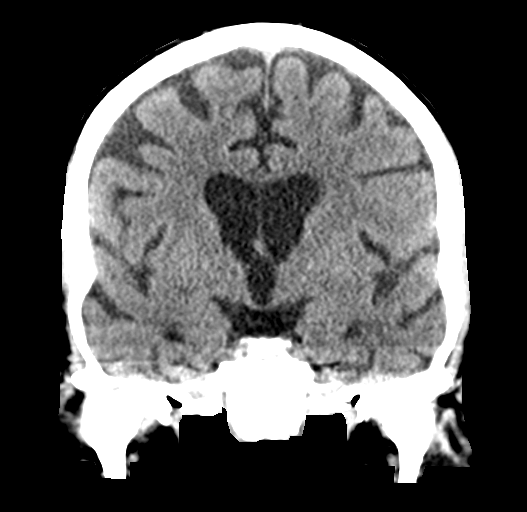

[Series 5: sagittal soft tissue · sagittal · 0.33mm/px · 3 of 54 slices shown]
[im 18/54  brain]
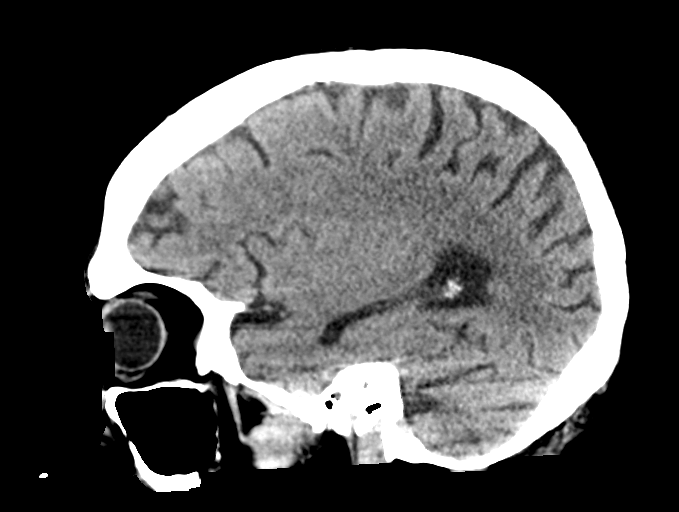
[im 27/54  brain]
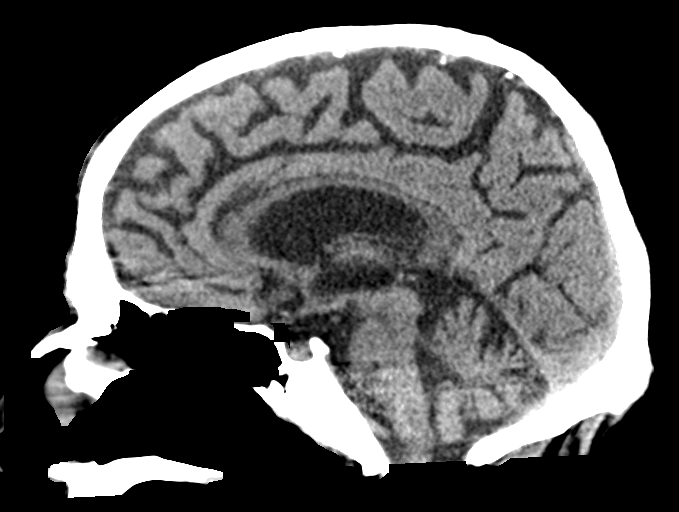
[im 36/54  brain]
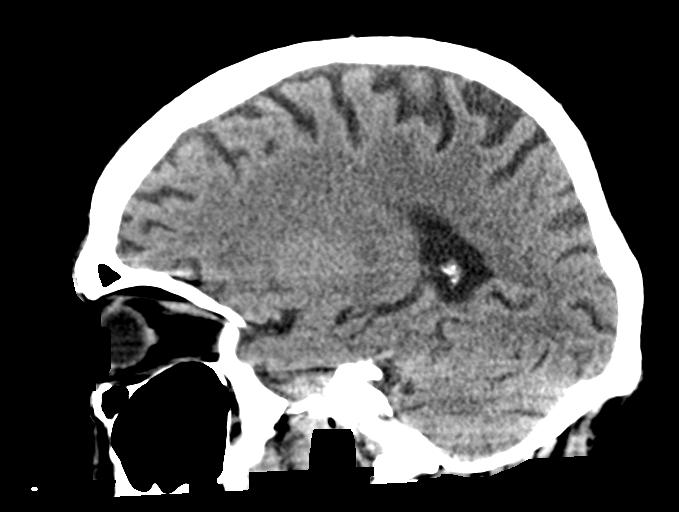

[16 of 47 positions shown; findings below may reference images not displayed]

FINDINGS: Brain: Mild but age advanced cerebral and cerebellar atrophy. No
intracranial hemorrhage, mass effect, or midline shift. No
hydrocephalus. The basilar cisterns are patent. No evidence of
territorial infarct or acute ischemia. No extra-axial or
intracranial fluid collection.

Vascular: No hyperdense vessel.

Skull: No fracture or focal lesion.

Sinuses/Orbits: Paranasal sinuses and mastoid air cells are clear.
The visualized orbits are unremarkable. There is mild soft tissue
thickening of the midline frontal scalp that may be related to
scarring.

Other: None.
IMPRESSION: 1. No acute intracranial abnormality.
2. Mild but age advanced cerebral and cerebellar atrophy.

## 2023-11-20 DIAGNOSIS — E11628 Type 2 diabetes mellitus with other skin complications: Secondary | ICD-10-CM | POA: Diagnosis present

## 2023-11-20 DIAGNOSIS — F1190 Opioid use, unspecified, uncomplicated: Secondary | ICD-10-CM | POA: Insufficient documentation

## 2023-12-08 DIAGNOSIS — Z8614 Personal history of Methicillin resistant Staphylococcus aureus infection: Secondary | ICD-10-CM | POA: Insufficient documentation

## 2024-06-09 ENCOUNTER — Inpatient Hospital Stay
Admission: EM | Admit: 2024-06-09 | Discharge: 2024-06-16 | DRG: 602 | Disposition: A | Discharge status: Home / Self Care | Attending: Internal Medicine | Admitting: Internal Medicine

## 2024-06-09 ENCOUNTER — Other Ambulatory Visit: Payer: Self-pay

## 2024-06-09 DIAGNOSIS — E1110 Type 2 diabetes mellitus with ketoacidosis without coma: Secondary | ICD-10-CM | POA: Diagnosis present

## 2024-06-09 DIAGNOSIS — E871 Hypo-osmolality and hyponatremia: Secondary | ICD-10-CM | POA: Diagnosis present

## 2024-06-09 DIAGNOSIS — D696 Thrombocytopenia, unspecified: Secondary | ICD-10-CM | POA: Diagnosis present

## 2024-06-09 DIAGNOSIS — Z7984 Long term (current) use of oral hypoglycemic drugs: Secondary | ICD-10-CM | POA: Diagnosis not present

## 2024-06-09 DIAGNOSIS — L97519 Non-pressure chronic ulcer of other part of right foot with unspecified severity: Secondary | ICD-10-CM | POA: Diagnosis present

## 2024-06-09 DIAGNOSIS — E875 Hyperkalemia: Secondary | ICD-10-CM | POA: Diagnosis present

## 2024-06-09 DIAGNOSIS — F1010 Alcohol abuse, uncomplicated: Secondary | ICD-10-CM | POA: Diagnosis present

## 2024-06-09 DIAGNOSIS — N179 Acute kidney failure, unspecified: Secondary | ICD-10-CM | POA: Diagnosis present

## 2024-06-09 DIAGNOSIS — E11649 Type 2 diabetes mellitus with hypoglycemia without coma: Secondary | ICD-10-CM | POA: Diagnosis not present

## 2024-06-09 DIAGNOSIS — F1320 Sedative, hypnotic or anxiolytic dependence, uncomplicated: Secondary | ICD-10-CM | POA: Diagnosis present

## 2024-06-09 DIAGNOSIS — L0211 Cutaneous abscess of neck: Principal | ICD-10-CM

## 2024-06-09 DIAGNOSIS — F1090 Alcohol use, unspecified, uncomplicated: Secondary | ICD-10-CM | POA: Diagnosis present

## 2024-06-09 DIAGNOSIS — D509 Iron deficiency anemia, unspecified: Secondary | ICD-10-CM | POA: Diagnosis present

## 2024-06-09 DIAGNOSIS — R739 Hyperglycemia, unspecified: Secondary | ICD-10-CM

## 2024-06-09 DIAGNOSIS — Z79899 Other long term (current) drug therapy: Secondary | ICD-10-CM

## 2024-06-09 DIAGNOSIS — Z811 Family history of alcohol abuse and dependence: Secondary | ICD-10-CM

## 2024-06-09 DIAGNOSIS — D539 Nutritional anemia, unspecified: Secondary | ICD-10-CM | POA: Diagnosis present

## 2024-06-09 DIAGNOSIS — F419 Anxiety disorder, unspecified: Secondary | ICD-10-CM | POA: Diagnosis present

## 2024-06-09 DIAGNOSIS — Z5320 Procedure and treatment not carried out because of patient's decision for unspecified reasons: Secondary | ICD-10-CM | POA: Diagnosis not present

## 2024-06-09 DIAGNOSIS — E878 Other disorders of electrolyte and fluid balance, not elsewhere classified: Secondary | ICD-10-CM | POA: Diagnosis present

## 2024-06-09 DIAGNOSIS — I10 Essential (primary) hypertension: Secondary | ICD-10-CM | POA: Insufficient documentation

## 2024-06-09 DIAGNOSIS — Z794 Long term (current) use of insulin: Secondary | ICD-10-CM | POA: Diagnosis not present

## 2024-06-09 DIAGNOSIS — L03221 Cellulitis of neck: Secondary | ICD-10-CM | POA: Diagnosis present

## 2024-06-09 DIAGNOSIS — L97529 Non-pressure chronic ulcer of other part of left foot with unspecified severity: Secondary | ICD-10-CM | POA: Diagnosis present

## 2024-06-09 DIAGNOSIS — F13930 Sedative, hypnotic or anxiolytic use, unspecified with withdrawal, uncomplicated: Secondary | ICD-10-CM

## 2024-06-09 DIAGNOSIS — Z888 Allergy status to other drugs, medicaments and biological substances status: Secondary | ICD-10-CM | POA: Diagnosis not present

## 2024-06-09 DIAGNOSIS — Z91199 Patient's noncompliance with other medical treatment and regimen due to unspecified reason: Secondary | ICD-10-CM

## 2024-06-09 DIAGNOSIS — Z556 Problems related to health literacy: Secondary | ICD-10-CM

## 2024-06-09 DIAGNOSIS — Z6372 Alcoholism and drug addiction in family: Secondary | ICD-10-CM

## 2024-06-09 DIAGNOSIS — E876 Hypokalemia: Secondary | ICD-10-CM | POA: Diagnosis present

## 2024-06-09 HISTORY — DX: Type 2 diabetes mellitus with ketoacidosis without coma: E11.10

## 2024-06-09 LAB — COMPREHENSIVE METABOLIC PANEL WITH GFR
ALT: 28 U/L (ref 0–44)
AST: 45 U/L — ABNORMAL HIGH (ref 15–41)
Albumin: 3.7 g/dL (ref 3.5–5.0)
Alkaline Phosphatase: 104 U/L (ref 38–126)
Anion gap: 16 — ABNORMAL HIGH (ref 5–15)
BUN: 13 mg/dL (ref 6–20)
CO2: 19 mmol/L — ABNORMAL LOW (ref 22–32)
Calcium: 8.1 mg/dL — ABNORMAL LOW (ref 8.9–10.3)
Chloride: 90 mmol/L — ABNORMAL LOW (ref 98–111)
Creatinine, Ser: 1.13 mg/dL (ref 0.61–1.24)
GFR, Estimated: >60 mL/min (ref >60)
Glucose, Bld: 343 mg/dL — ABNORMAL HIGH (ref 70–99)
Potassium: 2.8 mmol/L — ABNORMAL LOW (ref 3.5–5.1)
Sodium: 125 mmol/L — ABNORMAL LOW (ref 135–145)
Total Bilirubin: 1.3 mg/dL — ABNORMAL HIGH (ref 0.0–1.2)
Total Protein: 8.1 g/dL (ref 6.5–8.1)

## 2024-06-09 LAB — CBC WITH DIFFERENTIAL/PLATELET
Abs Immature Granulocytes: 0.08 K/uL — ABNORMAL HIGH (ref 0.00–0.07)
Basophils Absolute: 0 K/uL (ref 0.0–0.1)
Basophils Relative: 0 %
Eosinophils Absolute: 0 K/uL (ref 0.0–0.5)
Eosinophils Relative: 0 %
HCT: 33.9 % — ABNORMAL LOW (ref 39.0–52.0)
Hemoglobin: 12.4 g/dL — ABNORMAL LOW (ref 13.0–17.0)
Immature Granulocytes: 1 %
Lymphocytes Relative: 9 %
Lymphs Abs: 0.9 K/uL (ref 0.7–4.0)
MCH: 36.3 pg — ABNORMAL HIGH (ref 26.0–34.0)
MCHC: 36.6 g/dL — ABNORMAL HIGH (ref 30.0–36.0)
MCV: 99.1 fL (ref 80.0–100.0)
Monocytes Absolute: 0.6 K/uL (ref 0.1–1.0)
Monocytes Relative: 6 %
Neutro Abs: 8.3 K/uL — ABNORMAL HIGH (ref 1.7–7.7)
Neutrophils Relative %: 84 %
Platelets: 142 K/uL — ABNORMAL LOW (ref 150–400)
RBC: 3.42 MIL/uL — ABNORMAL LOW (ref 4.22–5.81)
RDW: 11.5 % (ref 11.5–15.5)
WBC: 9.9 K/uL (ref 4.0–10.5)
nRBC: 0 % (ref 0.0–0.2)

## 2024-06-09 LAB — URINALYSIS, ROUTINE W REFLEX MICROSCOPIC
Bacteria, UA: NONE SEEN
Bilirubin Urine: NEGATIVE
Glucose, UA: >=500 mg/dL — AB
Hgb urine dipstick: NEGATIVE
Ketones, ur: NEGATIVE mg/dL
Leukocytes,Ua: NEGATIVE
Nitrite: NEGATIVE
Protein, ur: 30 mg/dL — AB
Specific Gravity, Urine: 1.02 (ref 1.005–1.030)
pH: 6 (ref 5.0–8.0)

## 2024-06-09 LAB — BLOOD GAS, VENOUS

## 2024-06-09 LAB — CBG MONITORING, ED: Glucose-Capillary: 38 mg/dL — CL (ref 70–99)

## 2024-06-09 LAB — MAGNESIUM: Magnesium: 1.2 mg/dL — ABNORMAL LOW (ref 1.7–2.4)

## 2024-06-09 LAB — BETA-HYDROXYBUTYRIC ACID: Beta-Hydroxybutyric Acid: 0.06 mmol/L (ref 0.05–0.27)

## 2024-06-09 LAB — LIPASE, BLOOD: Lipase: 19 U/L (ref 11–51)

## 2024-06-09 MED ORDER — ENOXAPARIN SODIUM 40 MG/0.4ML IJ SOSY
40.0000 mg | PREFILLED_SYRINGE | INTRAMUSCULAR | Status: DC
  Administered 2024-06-09 – 2024-06-10 (×2): 40 mg via SUBCUTANEOUS
  Filled 2024-06-09 (×2): qty 0.4

## 2024-06-09 MED ORDER — DEXTROSE 50 % IV SOLN
INTRAVENOUS | Status: AC
  Administered 2024-06-09: 50 mL via INTRAVENOUS
  Filled 2024-06-09: qty 50

## 2024-06-09 MED ORDER — ACETAMINOPHEN 325 MG PO TABS
325.0000 mg | ORAL_TABLET | Freq: Four times a day (QID) | ORAL | Status: DC | PRN
  Administered 2024-06-10 – 2024-06-12 (×2): 325 mg via ORAL
  Filled 2024-06-09 (×3): qty 1

## 2024-06-09 MED ORDER — OXYCODONE-ACETAMINOPHEN 10-325 MG PO TABS: 1.0000 | ORAL_TABLET | Freq: Four times a day (QID) | ORAL | Status: DC | PRN

## 2024-06-09 MED ORDER — POTASSIUM CHLORIDE CRYS ER 20 MEQ PO TBCR
40.0000 meq | EXTENDED_RELEASE_TABLET | Freq: Once | ORAL | Status: AC
  Administered 2024-06-09: 40 meq via ORAL
  Filled 2024-06-09: qty 2

## 2024-06-09 MED ORDER — GABAPENTIN 300 MG PO CAPS
600.0000 mg | ORAL_CAPSULE | Freq: Three times a day (TID) | ORAL | Status: DC
  Administered 2024-06-09 – 2024-06-16 (×20): 600 mg via ORAL
  Filled 2024-06-09 (×20): qty 2

## 2024-06-09 MED ORDER — LOSARTAN POTASSIUM 50 MG PO TABS: 100.0000 mg | ORAL_TABLET | Freq: Every day | ORAL | Status: DC

## 2024-06-09 MED ORDER — POTASSIUM CHLORIDE IN NACL 20-0.9 MEQ/L-% IV SOLN
INTRAVENOUS | Status: AC
  Filled 2024-06-09 (×5): qty 1000

## 2024-06-09 MED ORDER — SODIUM CHLORIDE 0.9 % IV SOLN
1.0000 g | Freq: Once | INTRAVENOUS | Status: AC
  Administered 2024-06-09: 1 g via INTRAVENOUS
  Filled 2024-06-09: qty 10

## 2024-06-09 MED ORDER — ONDANSETRON HCL 4 MG PO TABS
4.0000 mg | ORAL_TABLET | Freq: Four times a day (QID) | ORAL | Status: DC | PRN
  Administered 2024-06-15: 4 mg via ORAL
  Filled 2024-06-09: qty 1

## 2024-06-09 MED ORDER — MAGNESIUM SULFATE 2 GM/50ML IV SOLN
2.0000 g | Freq: Once | INTRAVENOUS | Status: AC
  Administered 2024-06-09: 2 g via INTRAVENOUS
  Filled 2024-06-09: qty 50

## 2024-06-09 MED ORDER — TRAZODONE HCL 50 MG PO TABS
25.0000 mg | ORAL_TABLET | Freq: Every evening | ORAL | Status: DC | PRN
  Administered 2024-06-10 – 2024-06-12 (×3): 25 mg via ORAL
  Filled 2024-06-09 (×3): qty 1

## 2024-06-09 MED ORDER — ACETAMINOPHEN 650 MG RE SUPP: 650.0000 mg | Freq: Four times a day (QID) | RECTAL | Status: DC | PRN

## 2024-06-09 MED ORDER — INSULIN ASPART 100 UNIT/ML IJ SOLN
10.0000 [IU] | Freq: Once | INTRAMUSCULAR | Status: AC
  Administered 2024-06-09: 10 [IU] via SUBCUTANEOUS
  Filled 2024-06-09: qty 10

## 2024-06-09 MED ORDER — ENALAPRIL MALEATE 10 MG PO TABS
20.0000 mg | ORAL_TABLET | Freq: Two times a day (BID) | ORAL | Status: DC
  Administered 2024-06-10 – 2024-06-13 (×8): 20 mg via ORAL
  Filled 2024-06-09 (×7): qty 2
  Filled 2024-06-09: qty 1
  Filled 2024-06-09: qty 2
  Filled 2024-06-09: qty 1
  Filled 2024-06-09: qty 2

## 2024-06-09 MED ORDER — ONDANSETRON HCL 4 MG/2ML IJ SOLN
4.0000 mg | Freq: Four times a day (QID) | INTRAMUSCULAR | Status: DC | PRN
  Administered 2024-06-09 – 2024-06-14 (×12): 4 mg via INTRAVENOUS
  Filled 2024-06-09 (×11): qty 2

## 2024-06-09 MED ORDER — ACETAMINOPHEN 325 MG PO TABS
650.0000 mg | ORAL_TABLET | Freq: Four times a day (QID) | ORAL | Status: DC | PRN
  Administered 2024-06-10 – 2024-06-15 (×12): 650 mg via ORAL
  Filled 2024-06-09 (×13): qty 2

## 2024-06-09 MED ORDER — MAGNESIUM HYDROXIDE 400 MG/5ML PO SUSP: 30.0000 mL | Freq: Every day | ORAL | Status: DC | PRN

## 2024-06-09 MED ORDER — DEXTROSE 50 % IV SOLN: 50.0000 mL | Freq: Once | INTRAVENOUS | Status: AC

## 2024-06-09 MED ORDER — SODIUM CHLORIDE 0.9 % IV SOLN
2.0000 g | Freq: Three times a day (TID) | INTRAVENOUS | Status: DC
  Administered 2024-06-10 (×3): 2 g via INTRAVENOUS
  Filled 2024-06-09 (×5): qty 12.5

## 2024-06-09 MED ORDER — LACTATED RINGERS IV BOLUS
1000.0000 mL | Freq: Once | INTRAVENOUS | Status: AC
  Administered 2024-06-09: 1000 mL via INTRAVENOUS

## 2024-06-09 MED ORDER — SODIUM CHLORIDE 0.9 % IV SOLN: 2.0000 g | Freq: Once | INTRAVENOUS | Status: DC

## 2024-06-09 MED ORDER — ALBUTEROL SULFATE (2.5 MG/3ML) 0.083% IN NEBU: 3.0000 mL | INHALATION_SOLUTION | RESPIRATORY_TRACT | Status: DC | PRN

## 2024-06-09 MED ORDER — VANCOMYCIN HCL IN DEXTROSE 1-5 GM/200ML-% IV SOLN: 1000.0000 mg | Freq: Once | INTRAVENOUS | Status: DC

## 2024-06-09 MED ORDER — OXYCODONE HCL 5 MG PO TABS
10.0000 mg | ORAL_TABLET | Freq: Four times a day (QID) | ORAL | Status: DC | PRN
  Administered 2024-06-10 – 2024-06-16 (×22): 10 mg via ORAL
  Filled 2024-06-09 (×22): qty 2

## 2024-06-09 MED ORDER — PANTOPRAZOLE SODIUM 40 MG PO TBEC
40.0000 mg | DELAYED_RELEASE_TABLET | Freq: Every day | ORAL | Status: DC
  Administered 2024-06-09 – 2024-06-16 (×8): 40 mg via ORAL
  Filled 2024-06-09 (×8): qty 1

## 2024-06-09 MED ORDER — CLONAZEPAM 1 MG PO TABS
1.0000 mg | ORAL_TABLET | Freq: Two times a day (BID) | ORAL | Status: DC | PRN
  Administered 2024-06-09 – 2024-06-16 (×15): 1 mg via ORAL
  Filled 2024-06-09 (×9): qty 1
  Filled 2024-06-09: qty 2
  Filled 2024-06-09: qty 1
  Filled 2024-06-09: qty 2
  Filled 2024-06-09 (×3): qty 1

## 2024-06-09 MED ORDER — MIDAZOLAM HCL 2 MG/2ML IJ SOLN
4.0000 mg | Freq: Once | INTRAMUSCULAR | Status: AC
  Administered 2024-06-09: 4 mg via INTRAVENOUS
  Filled 2024-06-09: qty 4

## 2024-06-09 MED ORDER — VANCOMYCIN HCL 750 MG/150ML IV SOLN
750.0000 mg | Freq: Two times a day (BID) | INTRAVENOUS | Status: DC
  Administered 2024-06-10 – 2024-06-11 (×2): 750 mg via INTRAVENOUS
  Filled 2024-06-09 (×4): qty 150

## 2024-06-09 MED ORDER — VANCOMYCIN HCL 1500 MG/300ML IV SOLN
1500.0000 mg | Freq: Once | INTRAVENOUS | Status: AC
  Administered 2024-06-09: 1500 mg via INTRAVENOUS
  Filled 2024-06-09: qty 300

## 2024-06-09 NOTE — ED Triage Notes (Signed)
 Patient brought in via [facility] from home with complaints of BGL being over 500 after taking insulin . Patient also has ulcer on right foot and a cellulitis type wound to the back of the neck. Patient endorses feel sick, nauseous, and dizzy. States he lost pretty much all of his home medications.

## 2024-06-09 NOTE — ED Provider Notes (Signed)
 [facility] Provider Note    None    (approximate)   History   No chief complaint on file.   HPI  Andrew Buchanan is a 53 y.o. male who presents to the ED for evaluation of No chief complaint on file.   Review of PCP visit from 8/11, ulcers of both feet, for follow-up with podiatry.  Type 2 diabetes on insulin  recent A1c of 7.4.  Opiate and benzodiazepine dependence  Patient presents from home for evaluation of hyperglycemia.   Reports a spot in the back of his neck that is quite painful, no trauma.  Reports running out of his clonazepam , 2 mg twice daily and feeling increasingly anxious and poor sleep over the past 2 days.  No syncope or seizures.  Reports running out of his 10 mg Norco as well, cannot get refills of his medications for 2 weeks.   Physical Exam   Triage Vital Signs: ED Triage Vitals  Encounter Vitals Group     BP      Girls Systolic BP Percentile      Girls Diastolic BP Percentile      Boys Systolic BP Percentile      Boys Diastolic BP Percentile      Pulse      Resp      Temp      Temp src      SpO2      Weight      Height      Head Circumference      Peak Flow      Pain Score      Pain Loc      Pain Education      Exclude from Growth Chart     Most recent vital signs: There were no vitals filed for this visit.  General: Awake, no distress.  Anxious and mildly irritable without seizure activity or focal neurologic deficits. CV:  Good peripheral perfusion.  Resp:  Normal effort.  Abd:  No distention.  Soft MSK:  No deformity noted.  Neuro:  No focal deficits appreciated. Other:  Cellulitis to the back of the neck, pictured below.  Nothing coming to ahead, no purulence.     ED Results / Procedures / Treatments   Labs (all labs ordered are listed, but only abnormal results are displayed) Labs Reviewed - No data to display  EKG Poor quality EKG demonstrates a sinus rhythm with a rate of 83 bpm.   Normal axis and intervals without for signs of acute ischemia.  RADIOLOGY   Official radiology report(s): No results found.  PROCEDURES and INTERVENTIONS:  Procedures  Medications - No data to display   IMPRESSION / MDM / ASSESSMENT AND PLAN / ED COURSE  I reviewed the triage vital signs and the nursing notes.  Differential diagnosis includes, but is not limited to, DKA, HHS, sepsis, cellulitis, abscess, seizure, benzodiazepine withdrawals  {Patient presents with symptoms of an acute illness or injury that is potentially life-threatening.  Patient presents from home with hyperglycemia with evidence for skin and soft tissue infections and metabolic derangements requiring medical admission.  Borderline DKA with a gap of 16, hyperglycemia.  Notable hyponatremia, hypokalemia, hypomagnesemia with intact renal function.  No signs of sepsis or systemic illness, skin and soft tissue infection to the back of his neck for which he received ceftriaxone  and vancomycin .  Consult medicine for admission  Clinical Course as of 06/09/24 2023  Tue Jun 09, 2024  1934 reassessed [  DS]  1952 Reassessed.  He is appreciative and reports feeling much better after the first said calming his nerves. [DS]  2022 Reassessed and discussed plan of care [DS]    Clinical Course User Index [DS] Claudene Rover, MD     FINAL CLINICAL IMPRESSION(S) / ED DIAGNOSES   Final diagnoses:  None     Rx / DC Orders   ED Discharge Orders     None        Note:  This document was prepared using Dragon voice recognition software and may include unintentional dictation errors.   Claudene Rover, MD 06/09/24 2028

## 2024-06-09 NOTE — Progress Notes (Signed)
 Pharmacy Antibiotic Note  Andrew Buchanan is a 53 y.o. male w/ PMH of DM admitted on 06/09/2024 with ulcer on right foot and a cellulitis type wound to the back of the neck .  Pharmacy has been consulted for vancomycin  and cefepime  dosing.  Plan:  1) start cefepime  2 grams IV every 8 hours  2) start vancomycin  1500 mg IV x 1 then 750 mg IV every 12 hours Goal AUC 400-550. Expected AUC: 520.8 SCr used: 1.13 mg/dL   Height: 6' (817.0 cm) Weight: 64.7 kg (142 lb 10.2 oz) IBW/kg (Calculated) : 77.6  Temp (24hrs), Avg:97.7 F (36.5 C), Min:97.7 F (36.5 C), Max:97.7 F (36.5 C)  Recent Labs  Lab 06/09/24 1915  WBC 9.9  CREATININE 1.13    Estimated Creatinine Clearance: 69.2 mL/min (by C-G formula based on SCr of 1.13 mg/dL).    Allergies  Allergen Reactions   Amlodipine Shortness Of Breath   Antihistamines, Chlorpheniramine-Type Anaphylaxis   Statins Other (See Comments)    Pt declines    Antimicrobials this admission: 09/02 vancomycin  >>  09/02 cefepime  >>   Microbiology results: none ordered or pending  Thank you for allowing pharmacy to be a part of this patient's care.  Andrew Buchanan 06/09/2024 9:52 PM

## 2024-06-09 NOTE — ED Notes (Signed)
 Pt called out stating that he had a fever and felt like he was going to pass out. Pt did appear sweaty. This tech checked pt tempeture which came back being 97.9. Pt continued to state he felt like he was going to pass out. Payton,RN, notified.

## 2024-06-10 DIAGNOSIS — L03221 Cellulitis of neck: Secondary | ICD-10-CM | POA: Diagnosis not present

## 2024-06-10 DIAGNOSIS — I10 Essential (primary) hypertension: Secondary | ICD-10-CM | POA: Insufficient documentation

## 2024-06-10 DIAGNOSIS — E876 Hypokalemia: Secondary | ICD-10-CM

## 2024-06-10 DIAGNOSIS — F1010 Alcohol abuse, uncomplicated: Secondary | ICD-10-CM | POA: Diagnosis not present

## 2024-06-10 DIAGNOSIS — L0211 Cutaneous abscess of neck: Principal | ICD-10-CM

## 2024-06-10 HISTORY — DX: Cutaneous abscess of neck: L02.11

## 2024-06-10 LAB — BETA-HYDROXYBUTYRIC ACID: Beta-Hydroxybutyric Acid: 0.15 mmol/L (ref 0.05–0.27)

## 2024-06-10 LAB — BASIC METABOLIC PANEL WITH GFR
Anion gap: 13 (ref 5–15)
Anion gap: 11 (ref 5–15)
BUN: 11 mg/dL (ref 6–20)
BUN: 10 mg/dL (ref 6–20)
CO2: 21 mmol/L — ABNORMAL LOW (ref 22–32)
CO2: 18 mmol/L — ABNORMAL LOW (ref 22–32)
Calcium: 8.3 mg/dL — ABNORMAL LOW (ref 8.9–10.3)
Calcium: 7.9 mg/dL — ABNORMAL LOW (ref 8.9–10.3)
Chloride: 99 mmol/L (ref 98–111)
Chloride: 99 mmol/L (ref 98–111)
Creatinine, Ser: 0.93 mg/dL (ref 0.61–1.24)
Creatinine, Ser: 1.24 mg/dL (ref 0.61–1.24)
GFR, Estimated: >60 mL/min (ref >60)
GFR, Estimated: >60 mL/min (ref >60)
Glucose, Bld: 106 mg/dL — ABNORMAL HIGH (ref 70–99)
Glucose, Bld: 491 mg/dL — ABNORMAL HIGH (ref 70–99)
Potassium: 3.8 mmol/L (ref 3.5–5.1)
Potassium: 4.7 mmol/L (ref 3.5–5.1)
Sodium: 133 mmol/L — ABNORMAL LOW (ref 135–145)
Sodium: 128 mmol/L — ABNORMAL LOW (ref 135–145)

## 2024-06-10 LAB — CBG MONITORING, ED
Glucose-Capillary: 103 mg/dL — ABNORMAL HIGH (ref 70–99)
Glucose-Capillary: 111 mg/dL — ABNORMAL HIGH (ref 70–99)
Glucose-Capillary: 116 mg/dL — ABNORMAL HIGH (ref 70–99)
Glucose-Capillary: 131 mg/dL — ABNORMAL HIGH (ref 70–99)
Glucose-Capillary: 218 mg/dL — ABNORMAL HIGH (ref 70–99)
Glucose-Capillary: 260 mg/dL — ABNORMAL HIGH (ref 70–99)
Glucose-Capillary: 99 mg/dL (ref 70–99)

## 2024-06-10 LAB — GLUCOSE, CAPILLARY
Glucose-Capillary: 378 mg/dL — ABNORMAL HIGH (ref 70–99)
Glucose-Capillary: 429 mg/dL — ABNORMAL HIGH (ref 70–99)
Glucose-Capillary: 276 mg/dL — ABNORMAL HIGH (ref 70–99)
Glucose-Capillary: 71 mg/dL (ref 70–99)

## 2024-06-10 LAB — CBC
HCT: 36.1 % — ABNORMAL LOW (ref 39.0–52.0)
Hemoglobin: 12.4 g/dL — ABNORMAL LOW (ref 13.0–17.0)
MCH: 35.7 pg — ABNORMAL HIGH (ref 26.0–34.0)
MCHC: 34.3 g/dL (ref 30.0–36.0)
MCV: 104 fL — ABNORMAL HIGH (ref 80.0–100.0)
Platelets: 113 K/uL — ABNORMAL LOW (ref 150–400)
RBC: 3.47 MIL/uL — ABNORMAL LOW (ref 4.22–5.81)
RDW: 11.7 % (ref 11.5–15.5)
WBC: 8.9 K/uL (ref 4.0–10.5)
nRBC: 0 % (ref 0.0–0.2)

## 2024-06-10 LAB — HIV ANTIBODY (ROUTINE TESTING W REFLEX): HIV Screen 4th Generation wRfx: NONREACTIVE

## 2024-06-10 LAB — MAGNESIUM: Magnesium: 1.8 mg/dL (ref 1.7–2.4)

## 2024-06-10 MED ORDER — INSULIN GLARGINE 100 UNIT/ML ~~LOC~~ SOLN
15.0000 [IU] | SUBCUTANEOUS | Status: DC
  Filled 2024-06-10: qty 0.15

## 2024-06-10 MED ORDER — LORAZEPAM 1 MG PO TABS
1.0000 mg | ORAL_TABLET | ORAL | Status: DC | PRN
  Administered 2024-06-10 (×2): 2 mg via ORAL
  Administered 2024-06-10: 1 mg via ORAL
  Administered 2024-06-11: 3 mg via ORAL
  Administered 2024-06-11: 1 mg via ORAL
  Administered 2024-06-11: 3 mg via ORAL
  Administered 2024-06-11: 1 mg via ORAL
  Administered 2024-06-11 – 2024-06-13 (×9): 3 mg via ORAL
  Filled 2024-06-10: qty 3
  Filled 2024-06-10 (×3): qty 2
  Filled 2024-06-10: qty 1
  Filled 2024-06-10 (×2): qty 3
  Filled 2024-06-10: qty 1
  Filled 2024-06-10 (×6): qty 3
  Filled 2024-06-10 (×2): qty 1
  Filled 2024-06-10 (×2): qty 3

## 2024-06-10 MED ORDER — INSULIN ASPART 100 UNIT/ML IJ SOLN: 0.0000 [IU] | Freq: Every day | INTRAMUSCULAR | Status: DC

## 2024-06-10 MED ORDER — INSULIN GLARGINE 100 UNIT/ML ~~LOC~~ SOLN: 10.0000 [IU] | SUBCUTANEOUS | Status: DC

## 2024-06-10 MED ORDER — INSULIN ASPART 100 UNIT/ML IJ SOLN
5.0000 [IU] | Freq: Once | INTRAMUSCULAR | Status: AC
  Administered 2024-06-10: 5 [IU] via SUBCUTANEOUS
  Filled 2024-06-10: qty 1

## 2024-06-10 MED ORDER — INSULIN ASPART 100 UNIT/ML IJ SOLN
5.0000 [IU] | Freq: Three times a day (TID) | INTRAMUSCULAR | Status: AC
  Administered 2024-06-10 – 2024-06-11 (×2): 5 [IU] via SUBCUTANEOUS
  Filled 2024-06-10 (×2): qty 1

## 2024-06-10 MED ORDER — INSULIN ASPART 100 UNIT/ML IJ SOLN
0.0000 [IU] | Freq: Three times a day (TID) | INTRAMUSCULAR | Status: DC
  Administered 2024-06-10: 9 [IU] via SUBCUTANEOUS
  Administered 2024-06-10: 5 [IU] via SUBCUTANEOUS
  Filled 2024-06-10 (×2): qty 1

## 2024-06-10 MED ORDER — THIAMINE HCL 100 MG/ML IJ SOLN: 100.0000 mg | Freq: Every day | INTRAMUSCULAR | Status: DC

## 2024-06-10 MED ORDER — ADULT MULTIVITAMIN W/MINERALS CH
1.0000 | ORAL_TABLET | Freq: Every day | ORAL | Status: DC
  Administered 2024-06-10 – 2024-06-16 (×7): 1 via ORAL
  Filled 2024-06-10 (×7): qty 1

## 2024-06-10 MED ORDER — MUPIROCIN CALCIUM 2 % EX CREA
TOPICAL_CREAM | Freq: Every day | CUTANEOUS | Status: DC
  Filled 2024-06-10: qty 15

## 2024-06-10 MED ORDER — MAGNESIUM SULFATE 2 GM/50ML IV SOLN
2.0000 g | Freq: Once | INTRAVENOUS | Status: AC
  Administered 2024-06-10: 2 g via INTRAVENOUS
  Filled 2024-06-10: qty 50

## 2024-06-10 MED ORDER — THIAMINE MONONITRATE 100 MG PO TABS
100.0000 mg | ORAL_TABLET | Freq: Every day | ORAL | Status: DC
  Administered 2024-06-10 – 2024-06-16 (×7): 100 mg via ORAL
  Filled 2024-06-10 (×7): qty 1

## 2024-06-10 MED ORDER — INSULIN ASPART 100 UNIT/ML IJ SOLN: 0.0000 [IU] | Freq: Three times a day (TID) | INTRAMUSCULAR | Status: DC

## 2024-06-10 MED ORDER — LORAZEPAM 2 MG/ML IJ SOLN: 1.0000 mg | INTRAMUSCULAR | Status: DC | PRN

## 2024-06-10 MED ORDER — FOLIC ACID 1 MG PO TABS
1.0000 mg | ORAL_TABLET | Freq: Every day | ORAL | Status: DC
  Administered 2024-06-10 – 2024-06-16 (×7): 1 mg via ORAL
  Filled 2024-06-10 (×7): qty 1

## 2024-06-10 MED ORDER — OXYCODONE HCL 5 MG PO TABS
10.0000 mg | ORAL_TABLET | Freq: Once | ORAL | Status: AC
  Administered 2024-06-10: 10 mg via ORAL
  Filled 2024-06-10: qty 2

## 2024-06-10 NOTE — Assessment & Plan Note (Signed)
-   This is early and mild. - The patient will be admitted to a progressive unit bed. - Will continue aggressive hydration with IV normal saline with added potassium chloride . - Will keep him n.p.o. for now. - Will follow serial BMPs.

## 2024-06-10 NOTE — ED Notes (Signed)
 Supplies brought to bedside to clean/dress skin injuries to bil feet, pt declined at this time.

## 2024-06-10 NOTE — Progress Notes (Signed)
 Patient took out Tele monitoring, doesn't want Tele monitoring in-place. Education provided but still refused. MD notified. Patient was loud and agitated most of the time throughout the shift. Charge nurse and primary nurse provided reassurance and meds given as charted.

## 2024-06-10 NOTE — Assessment & Plan Note (Signed)
-   Replace potassium and follow level.

## 2024-06-10 NOTE — Assessment & Plan Note (Signed)
-   Magnesium  was replaced.

## 2024-06-10 NOTE — Progress Notes (Signed)
 Pt has came out in the hall multiple times yelling at staff because IV beeping and he wanted staff in his room when he call. Staff witness pt yelling at Jemaine, LPN for not coming when he call and Jemaine trying to explain he was providing care for another pt. Pt also arguing at Jemaine about medication after Jemaine gave pt what he asked for. This nurse was present when pt asked for Ativan  and Jemaine gave him the medication. This nurse took over care of pt.

## 2024-06-10 NOTE — ED Notes (Signed)
 Repeat BGL 116, prev BGL 38. While speaking to pt, pt reported that he took additional 15 units of humalog immediately prior to checking into ED. Prescribed home dose 6 units.

## 2024-06-10 NOTE — Assessment & Plan Note (Signed)
-   He was counseled for cessation. - He will be placed on CIWA protocol

## 2024-06-10 NOTE — ED Notes (Signed)
 Pt ambulatory to bathroom

## 2024-06-10 NOTE — ED Notes (Signed)
 Pt is irritated and impatient. C/o foot pain and is currently soaking his feet.

## 2024-06-10 NOTE — ED Notes (Signed)
 Pt provided with sandwich meal and phone to call family

## 2024-06-10 NOTE — ED Notes (Signed)
 Called CCMD to transfer pt's monitoring as he is now going upstairs

## 2024-06-10 NOTE — Assessment & Plan Note (Addendum)
-   He has a cellulitis and pending abscess. - The patient will be placed on IV cefepime  and vancomycin . - Warm compresses will be utilized. - He may need ENT consult for I&D.

## 2024-06-10 NOTE — Consult Note (Addendum)
 Patient ID: Andrew Buchanan, male   DOB: 01-05-1971, 53 y.o.   MRN: [id number] CC: Posterior Neck Abscess History of Present Illness Andrew Buchanan is a 53 y.o. male with past medical history of diabetes mellitus with uncontrolled sugars who presents in consultation for posterior neck abscess.  The patient reports that he has had this for several weeks.  He says that it is grown in size and it is hurting him.  He reports that he has scratched it a lot and has gotten some blood from it.  He reports that he had another swelling just superior to this where he scratched it and it busted open.  He reports that his sugars at home are generally in the 300s to 500s.  He denies any systemic symptoms including open fevers or chills..  Past Medical History Past Medical History:  Diagnosis Date   Alcohol abuse    Anxiety    Diabetes mellitus without complication (HCC)    Hypertension    Pancreatitis        Past Surgical History:  Procedure Laterality Date   HERNIA REPAIR     ROTATOR CUFF REPAIR Left     Allergies  Allergen Reactions   Amlodipine Shortness Of Breath   Antihistamines, Chlorpheniramine-Type Anaphylaxis   Statins Other (See Comments)    Pt declines    Current Facility-Administered Medications  Medication Dose Route Frequency Provider Last Rate Last Admin   0.9 % NaCl with KCl 20 mEq/ L  infusion   Intravenous Continuous Mansy, Jan A, MD 150 mL/hr at 06/10/24 1234 New Bag at 06/10/24 1234   acetaminophen  (TYLENOL ) tablet 650 mg  650 mg Oral Q6H PRN Mansy, Jan A, MD   650 mg at 06/10/24 1141   Or   acetaminophen  (TYLENOL ) suppository 650 mg  650 mg Rectal Q6H PRN Mansy, Jan A, MD       oxyCODONE  (Oxy IR/ROXICODONE ) immediate release tablet 10 mg  10 mg Oral Q6H PRN Dail Rankin RAMAN, RPH   10 mg at 06/10/24 1141   And   acetaminophen  (TYLENOL ) tablet 325 mg  325 mg Oral Q6H PRN Dail Rankin RAMAN, RPH       albuterol  (PROVENTIL ) (2.5 MG/3ML) 0.083% nebulizer solution 3 mL   3 mL Inhalation Q4H PRN Mansy, Jan A, MD       ceFEPIme  (MAXIPIME ) 2 g in sodium chloride  0.9 % 100 mL IVPB  2 g Intravenous Q8H Nada Adriana BIRCH, RPH   Stopped at 06/10/24 9391   clonazePAM  (KLONOPIN ) tablet 1 mg  1 mg Oral BID PRN Mansy, Jan A, MD   1 mg at 06/10/24 0529   enalapril  (VASOTEC ) tablet 20 mg  20 mg Oral BID Mansy, Jan A, MD   20 mg at 06/10/24 0911   enoxaparin  (LOVENOX ) injection 40 mg  40 mg Subcutaneous Q24H Mansy, Jan A, MD   40 mg at 06/09/24 2237   folic acid  (FOLVITE ) tablet 1 mg  1 mg Oral Daily Mansy, Jan A, MD   1 mg at 06/10/24 9089   gabapentin  (NEURONTIN ) capsule 600 mg  600 mg Oral TID Mansy, Jan A, MD   600 mg at 06/10/24 9089   insulin  aspart (novoLOG ) injection 0-5 Units  0-5 Units Subcutaneous QHS Fausto Sor A, DO       insulin  aspart (novoLOG ) injection 0-9 Units  0-9 Units Subcutaneous TID WC Fausto Sor A, DO   9 Units at 06/10/24 1323   insulin  glargine (LANTUS ) injection 15 Units  15 Units Subcutaneous Q24H Fausto Sor A, DO       LORazepam  (ATIVAN ) tablet 1-4 mg  1-4 mg Oral Q1H PRN Mansy, Jan A, MD   2 mg at 06/10/24 1232   magnesium  hydroxide (MILK OF MAGNESIA) suspension 30 mL  30 mL Oral Daily PRN Mansy, Jan A, MD       multivitamin with minerals tablet 1 tablet  1 tablet Oral Daily Mansy, Jan A, MD   1 tablet at 06/10/24 9088   mupirocin  cream (BACTROBAN ) 2 %   Topical Daily Fausto Sor A, DO   Given at 06/10/24 1300   ondansetron  (ZOFRAN ) tablet 4 mg  4 mg Oral Q6H PRN Mansy, Jan A, MD       Or   ondansetron  (ZOFRAN ) injection 4 mg  4 mg Intravenous Q6H PRN Mansy, Jan A, MD   4 mg at 06/09/24 2237   pantoprazole  (PROTONIX ) EC tablet 40 mg  40 mg Oral Daily Mansy, Jan A, MD   40 mg at 06/10/24 9088   thiamine  (VITAMIN B1) tablet 100 mg  100 mg Oral Daily Mansy, Jan A, MD   100 mg at 06/10/24 9089   Or   thiamine  (VITAMIN B1) injection 100 mg  100 mg Intravenous Daily Mansy, Jan A, MD       traZODone  (DESYREL ) tablet 25 mg  25 mg Oral  QHS PRN Mansy, Jan A, MD       vancomycin  (VANCOREADY) IVPB 750 mg/150 mL  750 mg Intravenous Q12H Nada Adriana BIRCH, RPH 150 mL/hr at 06/10/24 1316 750 mg at 06/10/24 1316    Family History Family History  Problem Relation Age of Onset   Alcoholism Father        Social History Social History   Tobacco Use   Smoking status: Some Days    Types: Cigars   Smokeless tobacco: Never  Substance Use Topics   Alcohol use: Yes    Comment: 1 bottle (a pint) of vodka per day.   Drug use: Yes    Types: Crack cocaine, Benzodiazepines, Cocaine        ROS Full ROS of systems performed and is otherwise negative there than what is stated in the HPI  Physical Exam Blood pressure (!) 155/77, pulse 75, temperature 98.2 F (36.8 C), temperature source Oral, resp. rate 16, height 6' (1.829 m), weight 64.7 kg, SpO2 97%.  Alert and oriented x 3, normal, breathing room air, regular rate and rhythm, abdomen soft, nontender nondistended, posterior neck there is an area of erythema with some induration and a mild amount of fluctuance at the center of this.  This is painful to touch.  There is a scratch mark where you can see where he scratched it.  Data Reviewed Sugars reviewed and they are quite high ranging from 0100-0429 in the last day.  He has no AKI. Last A1c in our system was 6.8 but this is almost 3 years ago. I have personally reviewed the patient's imaging and medical records.    Assessment/Plan    Patient with posterior neck abscess.  There is a very small amount of fluctuance so I will try to do an incision and drainage not sure that there would be a large amount of purulence that comes out.  The patient is adamant that he wants this done. Risk, benefits alternatives of the procedure including risk of further infection, incomplete incision and drainage and bleeding were discussed with the patient he understands this and wishes to proceed.  DM Needs better glycemic control, consider  insulin  gtt   Cellulitis of posterior neck Continue abx  A total of 60 minutes was spent reviewing the patient's chart, performing history and physical and discussing treatment options with the patient.  This was done irrespective of the procedure listed below.  Procedure Note After informed consent was obtained the patient was prepped with chlorhexidine  over the abscess.  A stab incision was made at the most fluctuant part and there was a small amount of bloody fluid that was expressed from it.  All loculations were broken up with a hemostat.  The abscess was then packed with iodoform gauze and dressed with gauze and a towel.  The patient tolerated the procedure well.   Andrew Buchanan 06/10/2024, 1:28 PM

## 2024-06-10 NOTE — Consult Note (Addendum)
 WOC Nurse Consult Note: Reason for Consult: Consult requested for bilat feet.  Pt has several chronic wounds and has been followed at [facility] as an outpatient, most recently on 8/11. He states he had previous surgery to the right foot and it has greatly improved.  Right plantar foot healing full thickness wound, 3X3X.1cm, beefy red and dry, surrounded by dry yellow callous to edges.  Small amt yellow drainage Left outer foot with full thickness wound, 1.2X1.2X.2cm, dry red with small amt yellow drainage, dry yellow loose peeling callous edges, .8X.8cm. Pt requested callous edges be trimmed, I used a scalpel and no pain or bleeding occurred.  Middle foot with dry brown callous with yellow raised edges, I was only able to trim a minimal amt and no pain or bleeding occurred.  I encouraged him to obtain follow-up with a podiatrist after discharge for continued callous trimming. Pt should resume follow-up with previous [facility] physician after discharge.  Dressing procedure/placement/frequency: Topical treatment orders provided for bedside nurses to perform as follows: Apply Bactroban  to bilat foot wounds Q day, then cover with foam dressings and kerlex/ace wraps to hold in place.   Right foot  Left foot     Please re-consult if further assistance is needed.  Thank-you,  Stephane Fought MSN, RN, CWOCN, CWCN-AP, CNS Contact Mon-Fri 0700-1500: [phone]

## 2024-06-10 NOTE — ED Notes (Signed)
 Pt resting with eyes closed, even rise and fall of chest. Call bell in reach. Lights off.

## 2024-06-10 NOTE — Inpatient Diabetes Management (Signed)
  Inpatient Diabetes Program Recommendations  AACE/ADA: New Consensus Statement on Inpatient Glycemic Control (2015)  Target Ranges:  Prepandial:   less than 140 mg/dL      Peak postprandial:   less than 180 mg/dL (1-2 hours)      Critically ill patients:  140 - 180 mg/dL    Latest Reference Range & Units 06/09/24 23:24 06/10/24 00:34 06/10/24 01:26 06/10/24 02:06 06/10/24 02:36 06/10/24 03:24 06/10/24 06:34 06/10/24 08:49  Glucose-Capillary 70 - 99 mg/dL 38 (LL) 883 (H) 868 (H) 111 (H) 99 103 (H) 218 (H) 260 (H)    Review of Glycemic Control  Diabetes history: DM2 Outpatient Diabetes medications: Lantus  50 units every morning and 30 units at bedtime, Humalog 6 units TID, Metformin  1,000mg  BID, and Actos  15mg  daily.  Patient states for the past 2 weeks the only diabetes medication he has been taking is Humalog 3-4 times per day.   Current orders for Inpatient glycemic control: none at this time - upon arrival, pt's CBG was 343 mg/dl at 8084 and was given one time dose of Novolog  10 units at 2035 CBG dropped to 35 mg/dl.  A1C 7.4% on 02/24/24.  Per office visit on 12/25/23 with Mardy, NP pt had reported he stopped taking his long-acting insulin  (lantus ) due to hypoglycemia.   Inpatient Diabetes Program Recommendations:   I spoke with pt to confirm what medications he was taking PRIOR to admission. Pt states the ONLY medication he was taking for his diabetes was Humalog 3-4 times per day. Pt states his endocrinologist instructed him to take:  Humalog 5 units when CBG is >200 mg/dl  Humalog 10 units when CBG is >300 mg/dl  Humalog 15 units when CBG is >400mg /dl   Spoke with patient regarding recent blood sugars. Pt explained yesterday he administered Humalog 15 units at home and rechecked his CBG after 30 mins and it was still 500 mg/dl - pt then called EMS. Pt states he stopped Lantus  2 weeks ago because it was making his blood sugar dropped into the 30's. Pt firmly stated I will never  ever take that medication again. Do not ever try to give me that medicine. I provided pt with education and made him aware Lantus  can be a safe an effective medication if it is taken in appropriate doses, his previous dose may have been too much. Pt continued to aggressively state he will not take this medication.   Insulin : Please consider adding Novolog  5 units TID with meals.    Thanks,  Lavanda Search, RN, MSN, [facility] ([place]  Inpatient Diabetes Coordinator  Pager [phone] (8a-5p)

## 2024-06-10 NOTE — ED Notes (Signed)
 Pt complains of being extremely anxious and starving. Pt demanding he eat or he will leave AMA. Pt also demanding his feet be washed. Provider aware of pt hunger and will review chart for possible diet order. Pt made aware. Pt also provided with ativan  for a CIWA score of 11 due to irritation and anxiety. Bucket of water provided to pt to clean feet and clean socks provided.

## 2024-06-10 NOTE — Progress Notes (Signed)
 1258 Pt refuses to take lantus  or semglee  or any other long acting insulin . States he had a bad reaction to lantus  once before. I will never take that again MD and primary nurse made aware

## 2024-06-10 NOTE — Assessment & Plan Note (Signed)
-   Will continue antihypertensive therapy.

## 2024-06-10 NOTE — Progress Notes (Addendum)
 Progress Note   Patient: Andrew Buchanan FMW:[id number] DOB: 23-Oct-1970 DOA: 06/09/2024     1 DOS: the patient was seen and examined on 06/10/2024   Brief hospital course: HPI on admission 06/09/24 PM: Andrew Buchanan is a 53 y.o. male with medical history significant for/hypertension, type 2 diabetes mellitus anxiety, alcohol abuse and pancreatitis, who presented to the emergency room with acute onset of hypoglycemia with associated polyuria and polydipsia.  He has been having posterior neck swelling, warmth tenderness and pain.  He has been having chills.  Admitted to nausea and vomiting without diarrhea.  No blood in his vomitus or hematemesis.  No chest pain or palpitations.  He has bilateral plantar feet wounds and has been followed by podiatry.   ED Course: Upon presentation to the ER, BP was 139/110 with otherwise normal vital signs.  Labs revealed mild hyponatremia 125 and hypochloremia of 90 with hypokalemia at 2.8 and blood glucose of 343 with CO2 of 19 and anion gap of 16, calcium  8.1 and magnesium  1.2 with AST 45 and total bili 1.3.  UA showed more than 500 glucose and 30 protein. EKG as reviewed by me : EKG showed sinus rhythm with a rate of 83 with borderline left axis deviation. Imaging: None.   The patient was given 4 mg of IV Rocephin  and 4 g of IV rhythm sulfate, 1 L bolus of IV lactated Ringer , 10 units subcutaneous NovoLog , 40 mL, p.o. potassium chloride  and 1 g of IV Rocephin .  He will be admitted to a progressive care bed for further evaluation and management.  Patient was admitted and started on IV antibiotics for neck abscess with cellulitis. Insulin  drip was not initiated on admission.  Pt noted to have a hypoglycemic episode, dropping glucose to 38 overnight at 2324, approximately 3 hours after being given 10 units subcutaneous Novolog .    06/10/34 -- pt glucose up to above 400 this afternoon.  Pt refusing to take ordered insulin .  Neck abscess I&D by general  surgeon, no significant fluid to send for culture. Pt may ultimately require insulin  drip in stepdown if    Assessment and Plan:  * DKA, type 2 -- mild, early -- DKA protocol was not followed on admission. Pt was given 10 units Novolog , had hypoglycemia to 38.  Anion gap closed on repeat metabolic panel. Suspect hyperglycemia related to infection and ?compliance with diabetes regimen. Type 2 diabetes, poorly controlled - last A1c in May 7.4% 06/10/24 -- pt refusing long-acting insulin  with glucose up to 378 this afternoon --Appreciate diabetes coordinator input --Semglee  15 units Q24H --Sliding scale Novolog  0-9 TID WC, 0-5 at bedtime --Holding home metformin , Actos  --Titrate regimen --Check repeat A1C - pending --Hypoglycemia protocol --CBG's AC/HS --Repeat BMP to reassess DKA status -- if gap elevated again, may have to start insulin  drip in ICU  Cellulitis and abscess of neck --General surgery to see and perform I&D --Continue IV Vanc & Cefepime  for now --Follow cultures --Warm compresses --Pain control PRN  Bilateral foot wounds, chronic --Followed by Podiatry, but by chart review has been poorly compliant with their recommendations --Images taken on admission reviewed - wounds show no signs of infection --Wound care consult  Hypokalemia - K 2.8 on admission was replaced. Resolved. --Monitor BMP, replace K PRN  Hypomagnesemia - initial Mg 1.2 was replaced. Resolved. - Monitor & replace Mg PRN  Anxiety --Continue home Klonopin  PRN  Essential hypertension --Continue home regimen --Hold meds if MAP<65  Alcohol abuse Counseled  on  admission regarding cessation. --Continue CIWA protocol for monitoring & management        Subjective: Pt awake sitting up in bed this AM on rounds.  He reports neck pain at the abscess site & asks for it to be lanced.  Also reporting foot pain and asking if time for pain medication.  Reports chills, no fevers. No other acute  complaints.  Notified by nursing this afternoon pt adamantly refusing insulin  as ordered, glucose elevated 378.   Physical Exam: Vitals:   06/10/24 0830 06/10/24 0926 06/10/24 1021 06/10/24 1228  BP: 113/82  108/86   Pulse: 63 87 73 75  Resp: 14  16   Temp:      TempSrc:      SpO2: 100% 100% 97%   Weight:      Height:       General exam: awake, alert, no acute distress HEENT: posterior midline neck scab and indurated erythematous warm to touch area around it, moist mucus membranes, hearing grossly normal  Respiratory system: CTAB, no wheezes, rales or rhonchi, normal respiratory effort. Cardiovascular system: normal S1/S2, RRR, no pedal edema.   Gastrointestinal system: soft, NT, ND Central nervous system: A&O x3. no gross focal neurologic deficits, normal speech Extremities: b/l feet wrapped in clean dry intact dressings, no edema, normal tone Skin: dry, intact, normal temperature Psychiatry: normal mood, congruent affect   Data Reviewed:  Notable labs --  Na 133 Bicarb 21 Glucose 106 Ca 8.3 Hbg 12.4 Platelets 113k  CBG's 99 >> 103 >> 218 >> 260 >> 378 >> 429   Family Communication: None present. Pt declines for me to call anyone.  Disposition: Status is: Inpatient Remains inpatient appropriate because: remains on IV antibiotics and with unstable blood sugars, pt refusing insulin  which will likely prolong his admission and may lead to escalation of care    Planned Discharge Destination: Home    Time spent: 52 minutes including time at bedside and in coordination of care with staff and consultants.  Author: Burnard DELENA Cunning, DO 06/10/2024 12:46 PM  For on call review [email or web address].

## 2024-06-10 NOTE — Assessment & Plan Note (Signed)
-   The patient will be placed on IV cefepime  and vancomycin . - Warm compresses will be utilized. - He may need ENT consult for I&D.

## 2024-06-10 NOTE — Assessment & Plan Note (Signed)
-   Will continue his Klonopin  and as needed Xanax .

## 2024-06-10 NOTE — H&P (Addendum)
 [facility]   PATIENT NAME: Andrew Buchanan    MR#:  [id number]  DATE OF BIRTH:  03-14-1971  DATE OF ADMISSION:  06/09/2024  PRIMARY CARE PHYSICIAN: System, Provider Not In   Patient is coming from: Home  REQUESTING/REFERRING PHYSICIAN: Claudene Pippin, MD  CHIEF COMPLAINT:   Chief Complaint  Patient presents with   Hyperglycemia    HISTORY OF PRESENT ILLNESS:  Andrew Buchanan is a 53 y.o. male with medical history significant for/hypertension, type 2 diabetes mellitus anxiety, alcohol abuse and pancreatitis, who presented to the emergency room with acute onset of hypoglycemia with associated polyuria and polydipsia.  He has been having posterior neck swelling, warmth tenderness and pain.  He has been having chills.  Admitted to nausea and vomiting without diarrhea.  No blood in his vomitus or hematemesis.  No chest pain or palpitations.  He has bilateral plantar feet wounds and has been followed by podiatry.  ED Course: Upon presentation to the ER, BP was 139/110 with otherwise normal vital signs.  Labs revealed mild hyponatremia 125 and hypochloremia of 90 with hypokalemia at 2.8 and blood glucose of 343 with CO2 of 19 and anion gap of 16, calcium  8.1 and magnesium  1.2 with AST 45 and total bili 1.3.  UA showed more than 500 glucose and 30 protein. EKG as reviewed by me : EKG showed sinus rhythm with a rate of 83 with borderline left axis deviation. Imaging: None.  The patient was given 4 mg of IV Rocephin  and 4 g of IV rhythm sulfate, 1 L bolus of IV lactated Ringer , 10 units subcutaneous NovoLog , 40 mL, p.o. potassium chloride  and 1 g of IV Rocephin .  He will be admitted to a progressive care bed for further evaluation and management. PAST MEDICAL HISTORY:   Past Medical History:  Diagnosis Date   Alcohol abuse    Anxiety    Diabetes mellitus without complication (HCC)    Hypertension    Pancreatitis     PAST SURGICAL HISTORY:   Past Surgical History:   Procedure Laterality Date   HERNIA REPAIR     ROTATOR CUFF REPAIR Left     SOCIAL HISTORY:   Social History   Tobacco Use   Smoking status: Some Days    Types: Cigars   Smokeless tobacco: Never  Substance Use Topics   Alcohol use: Yes    Comment: 1 bottle (a pint) of vodka per day.    FAMILY HISTORY:   Family History  Problem Relation Age of Onset   Alcoholism Father     DRUG ALLERGIES:   Allergies  Allergen Reactions   Amlodipine Shortness Of Breath   Antihistamines, Chlorpheniramine-Type Anaphylaxis   Statins Other (See Comments)    Pt declines    REVIEW OF SYSTEMS:   ROS As per history of present illness. All pertinent systems were reviewed above. Constitutional, HEENT, cardiovascular, respiratory, GI, GU, musculoskeletal, neuro, psychiatric, endocrine, integumentary and hematologic systems were reviewed and are otherwise negative/unremarkable except for positive findings mentioned above in the HPI.   MEDICATIONS AT HOME:   Prior to Admission medications   Medication Sig Start Date End Date Taking? Authorizing Provider  albuterol  (VENTOLIN  HFA) 108 (90 Base) MCG/ACT inhaler Inhale 1-2 puffs into the lungs every 4 (four) hours as needed. 06/08/21  Yes [provider]  clonazePAM  (KLONOPIN ) 1 MG tablet Take 1 mg by mouth 2 (two) times daily as needed.   Yes [provider]  enalapril  (VASOTEC ) 20  MG tablet Take 20 mg by mouth 2 (two) times daily. 06/03/24  Yes [provider]  gabapentin  (NEURONTIN ) 300 MG capsule Take 2 capsules (600 mg total) by mouth 2 (two) times daily. 05/20/20  Yes Patel, Sona, MD  insulin  lispro (HUMALOG) 100 UNIT/ML injection Inject 6 Units into the skin in the morning, at noon, and at bedtime. 06/24/21  Yes [provider]  losartan  (COZAAR ) 100 MG tablet Take 100 mg by mouth daily. 06/08/21  Yes [provider]  pioglitazone  (ACTOS ) 15 MG tablet Take 15 mg by mouth daily. 05/18/24 05/18/25 Yes  [provider]  ALPRAZolam  (XANAX ) 0.5 MG tablet Take 0.5 mg by mouth at bedtime as needed for anxiety. Patient not taking: Reported on 06/09/2024    [provider]  blood glucose meter kit and supplies KIT Dispense based on patient and insurance preference. Use up to four times daily as directed. 01/16/21   Rojelio Nest, DO  clonazePAM  (KLONOPIN ) 0.5 MG tablet Take 0.5 mg by mouth 3 (three) times daily as needed for anxiety. Patient not taking: Reported on 06/09/2024    [provider]  insulin  glargine (LANTUS ) 100 UNIT/ML Solostar Pen 50 units in the morning, 30 units at bedtime Patient not taking: Reported on 08/26/2021 01/16/21   Rojelio Nest, DO  Insulin  Pen Needle 31G X 5 MM MISC 1 each by Does not apply route 4 (four) times daily. 01/16/21   Rojelio Nest, DO  metFORMIN  (GLUCOPHAGE ) 1000 MG tablet Take 1 tablet (1,000 mg total) by mouth 2 (two) times daily with a meal. Patient not taking: Reported on 06/10/2024 05/20/20   Patel, Sona, MD  Multiple Vitamin (MULTIVITAMIN WITH MINERALS) TABS tablet Take 1 tablet by mouth daily. Patient not taking: Reported on 08/26/2021 05/21/20   Patel, Sona, MD  oxyCODONE -acetaminophen  (PERCOCET) 10-325 MG tablet Take 1 tablet by mouth every 6 (six) hours as needed.    [provider]  pantoprazole  (PROTONIX ) 40 MG tablet Take 40 mg by mouth daily. Patient not taking: Reported on 06/10/2024 04/28/21   [provider]      VITAL SIGNS:  Blood pressure 107/81, pulse 68, temperature 97.8 F (36.6 C), temperature source Oral, resp. rate 10, height 6' (1.829 m), weight 64.7 kg, SpO2 100%.  PHYSICAL EXAMINATION:  Physical Exam  GENERAL:  53 y.o.-year-old patient lying in the bed with no acute distress.  EYES: Pupils equal, round, reactive to light and accommodation. No scleral icterus. Extraocular muscles intact.  HEENT: Head atraumatic, normocephalic. Oropharynx and nasopharynx clear.  NECK:  Supple, no jugular  venous distention. No thyroid enlargement, no tenderness.  LUNGS: Normal breath sounds bilaterally, no wheezing, rales,rhonchi or crepitation. No use of accessory muscles of respiration.  CARDIOVASCULAR: Regular rate and rhythm, S1, S2 normal. No murmurs, rubs, or gallops.  ABDOMEN: Soft, nondistended, nontender. Bowel sounds present. No organomegaly or mass.  EXTREMITIES: No pedal edema, cyanosis, or clubbing.  NEUROLOGIC: Cranial nerves II through XII are intact. Muscle strength 5/5 in all extremities. Sensation intact. Gait not checked.  PSYCHIATRIC: The patient is alert and oriented x 3.  Normal affect and good eye contact. SKIN: Posterior neck swelling with erythema, induration, warmth and tenderness without fluctuation at this time.  He has bilateral plantar feet wounds without surrounding erythema or cellulitis.  LABORATORY PANEL:   CBC Recent Labs  Lab 06/10/24 0328  WBC 8.9  HGB 12.4*  HCT 36.1*  PLT 113*   ------------------------------------------------------------------------------------------------------------------  Chemistries  Recent Labs  Lab 06/09/24 1915  NA 125*  K 2.8*  CL 90*  CO2 19*  GLUCOSE 343*  BUN 13  CREATININE 1.13  CALCIUM  8.1*  MG 1.2*  AST 45*  ALT 28  ALKPHOS 104  BILITOT 1.3*   ------------------------------------------------------------------------------------------------------------------  Cardiac Enzymes No results for input(s): TROPONINI in the last 168 hours. ------------------------------------------------------------------------------------------------------------------  RADIOLOGY:  No results found.    IMPRESSION AND PLAN:  Assessment and Plan: * DKA, type 2 (HCC) - This is early and mild. - The patient will be admitted to a progressive unit bed. - Will continue aggressive hydration with IV normal saline with added potassium chloride . - Will keep him n.p.o. for now. - Will follow serial BMPs.  Cellulitis and  abscess of neck - He has a cellulitis and pending abscess. - The patient will be placed on IV cefepime  and vancomycin . - Warm compresses will be utilized. - He may need ENT consult for I&D.  Hypokalemia - Replace potassium and follow level.  Hypomagnesemia - Magnesium  was replaced.  Anxiety - Will continue his Klonopin  and as needed Xanax .  Essential hypertension - Will continue antihypertensive therapy.  Alcohol abuse - He was counseled for cessation. - He will be placed on CIWA protocol      DVT prophylaxis: Lovenox . Advanced Care Planning:  Code Status: full code. Family Communication:  The plan of care was discussed in details with the patient (and family). I answered all questions. The patient agreed to proceed with the above mentioned plan. Further management will depend upon hospital course. Disposition Plan: Back to previous home environment Consults called: none. All the records are reviewed and case discussed with ED provider.  Status is: Inpatient   At the time of the admission, it appears that the appropriate admission status for this patient is inpatient.  This is judged to be reasonable and necessary in order to provide the required intensity of service to ensure the patient's safety given the presenting symptoms, physical exam findings and initial radiographic and laboratory data in the context of comorbid conditions.  The patient requires inpatient status due to high intensity of service, high risk of further deterioration and high frequency of surveillance required.  I certify that at the time of admission, it is my clinical judgment that the patient will require inpatient hospital care extending more than 2 midnights.                            Dispo: The patient is from: Home              Anticipated d/c is to: Home              Patient currently is not medically stable to d/c.              Difficult to place patient: No  Madison DELENA Peaches M.D on 06/10/2024 at  4:03 AM  [facility]   From 7 PM-7 AM, contact night-coverage [email or web address]  CC: Primary care physician; System, Provider Not In

## 2024-06-10 NOTE — ED Notes (Signed)
 Breakfast tray given to pt by dietary

## 2024-06-11 DIAGNOSIS — E1110 Type 2 diabetes mellitus with ketoacidosis without coma: Secondary | ICD-10-CM | POA: Diagnosis not present

## 2024-06-11 DIAGNOSIS — L03221 Cellulitis of neck: Secondary | ICD-10-CM | POA: Diagnosis not present

## 2024-06-11 DIAGNOSIS — L0211 Cutaneous abscess of neck: Secondary | ICD-10-CM | POA: Diagnosis not present

## 2024-06-11 DIAGNOSIS — F1010 Alcohol abuse, uncomplicated: Secondary | ICD-10-CM | POA: Diagnosis not present

## 2024-06-11 LAB — CBC
HCT: 28.9 % — ABNORMAL LOW (ref 39.0–52.0)
Hemoglobin: 9.9 g/dL — ABNORMAL LOW (ref 13.0–17.0)
MCH: 36.1 pg — ABNORMAL HIGH (ref 26.0–34.0)
MCHC: 34.3 g/dL (ref 30.0–36.0)
MCV: 105.5 fL — ABNORMAL HIGH (ref 80.0–100.0)
Platelets: 93 K/uL — ABNORMAL LOW (ref 150–400)
RBC: 2.74 MIL/uL — ABNORMAL LOW (ref 4.22–5.81)
RDW: 11.9 % (ref 11.5–15.5)
WBC: 6.9 K/uL (ref 4.0–10.5)
nRBC: 0 % (ref 0.0–0.2)

## 2024-06-11 LAB — BLOOD GAS, VENOUS
Bicarbonate: 23.7 mmol/L — AB (ref 20.0–28.0)
O2 Saturation: 19.4 mmol/L — AB (ref 0.0–2.0)
Patient temperature: 37
Patient temperature: 37
pCO2, Ven: 44 mmHg (ref 44–60)
pH, Ven: 7.34 (ref 7.25–7.43)
pO2, Ven: 23.7 mmol/L (ref 32–45)

## 2024-06-11 LAB — BASIC METABOLIC PANEL WITH GFR
Anion gap: 7 (ref 5–15)
Anion gap: 8 (ref 5–15)
BUN: 9 mg/dL (ref 6–20)
BUN: 9 mg/dL (ref 6–20)
CO2: 20 mmol/L — ABNORMAL LOW (ref 22–32)
CO2: 19 mmol/L — ABNORMAL LOW (ref 22–32)
Calcium: 8.3 mg/dL — ABNORMAL LOW (ref 8.9–10.3)
Calcium: 8.4 mg/dL — ABNORMAL LOW (ref 8.9–10.3)
Chloride: 102 mmol/L (ref 98–111)
Chloride: 107 mmol/L (ref 98–111)
Creatinine, Ser: 1.19 mg/dL (ref 0.61–1.24)
Creatinine, Ser: 1.1 mg/dL (ref 0.61–1.24)
GFR, Estimated: >60 mL/min (ref >60)
GFR, Estimated: >60 mL/min (ref >60)
Glucose, Bld: 508 mg/dL (ref 70–99)
Glucose, Bld: 105 mg/dL — ABNORMAL HIGH (ref 70–99)
Potassium: 5 mmol/L (ref 3.5–5.1)
Potassium: 4 mmol/L (ref 3.5–5.1)
Sodium: 129 mmol/L — ABNORMAL LOW (ref 135–145)
Sodium: 134 mmol/L — ABNORMAL LOW (ref 135–145)

## 2024-06-11 LAB — GLUCOSE, CAPILLARY
Glucose-Capillary: 470 mg/dL — ABNORMAL HIGH (ref 70–99)
Glucose-Capillary: 216 mg/dL — ABNORMAL HIGH (ref 70–99)
Glucose-Capillary: 55 mg/dL — ABNORMAL LOW (ref 70–99)
Glucose-Capillary: 61 mg/dL — ABNORMAL LOW (ref 70–99)
Glucose-Capillary: 70 mg/dL (ref 70–99)
Glucose-Capillary: 422 mg/dL — ABNORMAL HIGH (ref 70–99)
Glucose-Capillary: 336 mg/dL — ABNORMAL HIGH (ref 70–99)
Glucose-Capillary: 322 mg/dL — ABNORMAL HIGH (ref 70–99)

## 2024-06-11 LAB — HEMOGLOBIN A1C
Hgb A1c MFr Bld: 9.5 % — ABNORMAL HIGH (ref 4.8–5.6)
Mean Plasma Glucose: 226 mg/dL

## 2024-06-11 MED ORDER — INSULIN ASPART 100 UNIT/ML IJ SOLN
0.0000 [IU] | INTRAMUSCULAR | Status: AC
  Administered 2024-06-11: 9 [IU] via SUBCUTANEOUS
  Administered 2024-06-11: 3 [IU] via SUBCUTANEOUS
  Filled 2024-06-11 (×2): qty 1

## 2024-06-11 MED ORDER — SODIUM CHLORIDE 0.9 % IV BOLUS: 1000.0000 mL | Freq: Once | INTRAVENOUS | Status: DC

## 2024-06-11 MED ORDER — AMOXICILLIN-POT CLAVULANATE 875-125 MG PO TABS
1.0000 | ORAL_TABLET | Freq: Two times a day (BID) | ORAL | Status: AC
Start: 2024-06-11 — End: 2024-06-15
  Administered 2024-06-11 – 2024-06-15 (×10): 1 via ORAL
  Filled 2024-06-11 (×10): qty 1

## 2024-06-11 MED ORDER — INSULIN ASPART 100 UNIT/ML IJ SOLN
0.0000 [IU] | INTRAMUSCULAR | Status: DC
  Administered 2024-06-11: 20 [IU] via SUBCUTANEOUS
  Filled 2024-06-11: qty 1

## 2024-06-11 MED ORDER — INSULIN ASPART PROT & ASPART (70-30 MIX) 100 UNIT/ML ~~LOC~~ SUSP
10.0000 [IU] | Freq: Two times a day (BID) | SUBCUTANEOUS | Status: DC
  Administered 2024-06-12 – 2024-06-13 (×3): 10 [IU] via SUBCUTANEOUS
  Filled 2024-06-11: qty 10

## 2024-06-11 MED ORDER — INSULIN ASPART 100 UNIT/ML IJ SOLN
7.0000 [IU] | Freq: Once | INTRAMUSCULAR | Status: AC
Start: 2024-06-12 — End: 2024-06-11
  Administered 2024-06-11: 7 [IU] via SUBCUTANEOUS
  Filled 2024-06-11: qty 1

## 2024-06-11 MED ORDER — DOXYCYCLINE HYCLATE 100 MG PO TABS
100.0000 mg | ORAL_TABLET | Freq: Two times a day (BID) | ORAL | Status: AC
  Administered 2024-06-11 – 2024-06-15 (×10): 100 mg via ORAL
  Filled 2024-06-11 (×10): qty 1

## 2024-06-11 NOTE — Progress Notes (Signed)
 Progress Note   Patient: Andrew Buchanan FMW:[id number] DOB: [birth year] DOA: 06/09/2024     2 DOS: the patient was seen and examined on 06/11/2024   Brief hospital course: HPI on admission 06/09/24 PM: Azavier Creson is a 53 y.o. male with medical history significant for/hypertension, type 2 diabetes mellitus anxiety, alcohol abuse and pancreatitis, who presented to the emergency room with acute onset of hypoglycemia with associated polyuria and polydipsia.  He has been having posterior neck swelling, warmth tenderness and pain.  He has been having chills.  Admitted to nausea and vomiting without diarrhea.  No blood in his vomitus or hematemesis.  No chest pain or palpitations.  He has bilateral plantar feet wounds and has been followed by podiatry.   ED Course: Upon presentation to the ER, BP was 139/110 with otherwise normal vital signs.  Labs revealed mild hyponatremia 125 and hypochloremia of 90 with hypokalemia at 2.8 and blood glucose of 343 with CO2 of 19 and anion gap of 16, calcium  8.1 and magnesium  1.2 with AST 45 and total bili 1.3.  UA showed more than 500 glucose and 30 protein. EKG as reviewed by me : EKG showed sinus rhythm with a rate of 83 with borderline left axis deviation. Imaging: None.   The patient was given 4 mg of IV Rocephin  and 4 g of IV rhythm sulfate, 1 L bolus of IV lactated Ringer , 10 units subcutaneous NovoLog , 40 mL, p.o. potassium chloride  and 1 g of IV Rocephin .  He will be admitted to a progressive care bed for further evaluation and management.  Patient was admitted and started on IV antibiotics for neck abscess with cellulitis. Insulin  drip was not initiated on admission.  Pt noted to have a hypoglycemic episode, dropping glucose to 38 overnight at 2324, approximately 3 hours after being given 10 units subcutaneous Novolog .    06/10/34 -- pt glucose up to above 400 this afternoon.  Pt refusing to take ordered insulin .  Neck abscess I&D by general  surgeon, no significant fluid to send for culture. Pt may ultimately require insulin  drip in stepdown if    Assessment and Plan:  * DKA, type 2 -- mild, early -- DKA protocol was not followed on admission. Pt was given 10 units Novolog , had hypoglycemia to 38.  Anion gap closed on repeat metabolic panel. Suspect hyperglycemia related to infection and ?compliance with diabetes regimen. Type 2 diabetes, poorly controlled - last A1c in May 7.4% 06/10/24 -- pt refusing long-acting insulin  with glucose up to 378 this afternoon New A1c is 9.5% -- very poorly controlled 06/11/24 - labile sugars, adjusting regimen, starting 70/30 --Appreciate diabetes coordinator input --Transitioning to 70/30 insulin  10 units BID with dinner this evening --Pt refuses to use glargine / long-acting insulin  --Sugars have been very labile, from upper 400's to hypoglycemic --Will stop sliding scale Novolog  this afternoon with starting 70/30 --Holding home metformin , Actos  --Titrate regimen --Hypoglycemia protocol  Cellulitis and abscess of neck --General surgery performed bedside I&D on 9/3.  Not enough fluid for culture. --Transition IV Vanc & Cefepime  >> PO Augmentin  & Doxycycline   --Warm compresses --Pain control PRN  Bilateral foot wounds, chronic --Followed by Podiatry, but by chart review has been poorly compliant with their recommendations --Images taken on admission reviewed - wounds show no signs of infection --Wound care consulted - see orders  Thrombocytopenia - suspect alcohol induced myelosuppression and/or infection --Monitor CBC --Will hold Lovenox  since neck abscess site bleeding today as well  Hypokalemia -  K 2.8 on admission was replaced. Resolved. --Monitor BMP, replace K PRN  Hypomagnesemia - initial Mg 1.2 was replaced. Resolved. - Monitor & replace Mg PRN  Anxiety --Continue home Klonopin  PRN  Essential hypertension --Continue home regimen --Hold meds if MAP<65  Alcohol  abuse Counseled  on admission regarding cessation. --Continue CIWA protocol for monitoring & management        Subjective: Pt awake sitting up in bed this AM on rounds. He reports pain in both feet.  No fever/chills.  States his blood sugars go all over at home also - will go from 500 down to 30.  Says he wants to stay here until Monday.  Pt on phone speaking to someone about setting up furniture at home, not having a TV or internet and doesn't want to go there with nothing to do.   Physical Exam: Vitals:   06/10/24 2022 06/10/24 2151 06/11/24 0226 06/11/24 0742  BP: 119/86 119/86 (!) 129/95 (!) 145/102  Pulse: 66 66 78 (!) 101  Resp:   18 15  Temp:   98 F (36.7 C) 98 F (36.7 C)  TempSrc:      SpO2:   100% 100%  Weight:      Height:       General exam: awake, alert, no acute distress HEENT: posterior midline abscess has been drained gauze dressing in place with recent but not active bleeding noted Respiratory system: on room air, normal respiratory effort. Cardiovascular system: RRR, no pedal edema.   Central nervous system: A&O x3. no gross focal neurologic deficits, normal speech Extremities: b/l feet wrapped in clean dry intact dressings Skin: dry, intact, normal temperature Psychiatry: normal mood, congruent affect   Data Reviewed:  Notable labs --  Na 134 Bicarb 19 / gap noram 11 >> 7 >> 8 Glucose 105 Ca 8.4  Hbg 9.9 Plateletes 93k  CBG's 429 >> 276 >> 71 >> 470 >> 216 >> 55 >> 61 >> 70   Family Communication: None present. Pt declines for me to call anyone.  Disposition: Status is: Inpatient Remains inpatient appropriate because: labile blood sugars with multiple severely elevated readings and hypoglycemic episodes.     Planned Discharge Destination: Home    Time spent: 50 minutes including time at bedside and in coordination of care with staff and consultants.  Author: Burnard DELENA Cunning, DO 06/11/2024 1:11 PM  For on call review  [email or web address].

## 2024-06-11 NOTE — Progress Notes (Signed)
 Pt has been argumentative all shift with staff, demanding pain medications, non compliant with safety precautions, and pull his IV out and tele leads off. Discuss with pt the importance of these interventions. Pt continues to be uncooperative, verbally loud and argumentative.

## 2024-06-11 NOTE — Inpatient Diabetes Management (Signed)
 Inpatient Diabetes Program Recommendations  AACE/ADA: New Consensus Statement on Inpatient Glycemic Control   Target Ranges:  Prepandial:   less than 140 mg/dL      Peak postprandial:   less than 180 mg/dL (1-2 hours)      Critically ill patients:  140 - 180 mg/dL    Latest Reference Range & Units 06/10/24 08:49 06/10/24 11:24 06/10/24 12:53 06/10/24 16:30 06/10/24 20:47 06/11/24 07:40 06/11/24 09:01  Glucose-Capillary 70 - 99 mg/dL 739 (H) 621 (H) 570 (H) 276 (H) 71 470 (H) 216 (H)   Review of Glycemic Control  Diabetes history: DM2 Outpatient Diabetes medications: Lantus  50 units every morning and 30 units at bedtime, Humalog 6 units TID, Metformin  1,000mg  BID, and Actos  15mg  daily.  Patient states for the past 2 weeks the only diabetes medication he has been taking is Humalog 3-4 times per day.   Current orders for Inpatient glycemic control: Novolog  0-9 units TID with meals, Novolog  0-5 units at bedtime, and Novolog  5 units TID with meals.   Inpatient Diabetes Program Recommendations: Spoke with primary nurse who states after CBG of 71 mg/dl last night, pt did eat snacks - this may be the reasoning for CBG of 470 mg/dl this morning. Pt received Novolog  20 units this morning for correction and most recent CBG was 216 mg/dl. I spoke with patient and discussed recent elevated blood sugars and the need to adjust his current regimen for better control. Pt becomes very agitated when trying to provide education and states he think Humalog works best for him and he will never ever go on Lantus  again. I discussed the option to try Novolin 70/30 and how it works to better control his blood sugars. Patent is agreeable to try this.   Insulin : Please consider starting Novolog  70/30 10 units BID starting tonight at dinner and discontinue Novolog  5 units TID meal coverage (after he receives his lunch time dose).   Thanks,  Lavanda Search, RN, MSN, [facility]  Inpatient Diabetes Coordinator  Pager  [phone] (8a-5p)

## 2024-06-11 NOTE — Progress Notes (Signed)
 Patient s/p incision and drainage of posterior neck abscess.  Did have a little bit of bleeding but this was controlled with pressure dressing.  Packing removed today.  There does not seem to be any purulent discharge or fluctuance.  There continues to be significant induration with surrounding cellulitis.  He will need IV antibiotics for this.  No need for repacking of the incision and can dress the wound with local wound care.  He needs to get his sugars under control to help with healing and reduce the risk of recurrent infections.

## 2024-06-11 NOTE — Progress Notes (Signed)
 Patient extremely agitated this morning. He is upset that he can't eat due to being NPO. Patient also wants klonopin  rather than ativan . I explained the orders and the timing to him and he was agreeable to take the ativan  based on his CIWA score of 16. Patient calmed down a bit when I showed him the specific orders and the message to Dr. Fausto. Insulin  orders were clarified by this nurse. Diet order entered and patient informed of changes.

## 2024-06-11 NOTE — Plan of Care (Signed)

## 2024-06-12 DIAGNOSIS — E1110 Type 2 diabetes mellitus with ketoacidosis without coma: Secondary | ICD-10-CM | POA: Diagnosis not present

## 2024-06-12 LAB — GLUCOSE, CAPILLARY
Glucose-Capillary: 262 mg/dL — ABNORMAL HIGH (ref 70–99)
Glucose-Capillary: 481 mg/dL — ABNORMAL HIGH (ref 70–99)
Glucose-Capillary: 332 mg/dL — ABNORMAL HIGH (ref 70–99)
Glucose-Capillary: 148 mg/dL — ABNORMAL HIGH (ref 70–99)
Glucose-Capillary: 282 mg/dL — ABNORMAL HIGH (ref 70–99)
Glucose-Capillary: 320 mg/dL — ABNORMAL HIGH (ref 70–99)

## 2024-06-12 LAB — BASIC METABOLIC PANEL WITH GFR
Anion gap: 9 (ref 5–15)
BUN: 11 mg/dL (ref 6–20)
CO2: 24 mmol/L (ref 22–32)
Calcium: 8.9 mg/dL (ref 8.9–10.3)
Chloride: 99 mmol/L (ref 98–111)
Creatinine, Ser: 1.18 mg/dL (ref 0.61–1.24)
GFR, Estimated: >60 mL/min (ref >60)
Glucose, Bld: 259 mg/dL — ABNORMAL HIGH (ref 70–99)
Potassium: 4.6 mmol/L (ref 3.5–5.1)
Sodium: 132 mmol/L — ABNORMAL LOW (ref 135–145)

## 2024-06-12 LAB — MAGNESIUM: Magnesium: 1.4 mg/dL — ABNORMAL LOW (ref 1.7–2.4)

## 2024-06-12 LAB — CBC
HCT: 29.9 % — ABNORMAL LOW (ref 39.0–52.0)
Hemoglobin: 10 g/dL — ABNORMAL LOW (ref 13.0–17.0)
MCH: 35.3 pg — ABNORMAL HIGH (ref 26.0–34.0)
MCHC: 33.4 g/dL (ref 30.0–36.0)
MCV: 105.7 fL — ABNORMAL HIGH (ref 80.0–100.0)
Platelets: 120 K/uL — ABNORMAL LOW (ref 150–400)
RBC: 2.83 MIL/uL — ABNORMAL LOW (ref 4.22–5.81)
RDW: 11.7 % (ref 11.5–15.5)
WBC: 8.2 K/uL (ref 4.0–10.5)
nRBC: 0 % (ref 0.0–0.2)

## 2024-06-12 MED ORDER — INSULIN ASPART 100 UNIT/ML IJ SOLN
0.0000 [IU] | Freq: Three times a day (TID) | INTRAMUSCULAR | Status: DC
  Administered 2024-06-12: 4 [IU] via SUBCUTANEOUS
  Administered 2024-06-13: 2 [IU] via SUBCUTANEOUS
  Administered 2024-06-13: 3 [IU] via SUBCUTANEOUS
  Administered 2024-06-13: 5 [IU] via SUBCUTANEOUS
  Administered 2024-06-14: 6 [IU] via SUBCUTANEOUS
  Administered 2024-06-14: 3 [IU] via SUBCUTANEOUS
  Administered 2024-06-15: 1 [IU] via SUBCUTANEOUS
  Administered 2024-06-15: 4 [IU] via SUBCUTANEOUS
  Administered 2024-06-16: 3 [IU] via SUBCUTANEOUS
  Filled 2024-06-12 (×10): qty 1

## 2024-06-12 MED ORDER — MAGNESIUM SULFATE 2 GM/50ML IV SOLN
2.0000 g | Freq: Once | INTRAVENOUS | Status: AC
  Administered 2024-06-12: 2 g via INTRAVENOUS
  Filled 2024-06-12: qty 50

## 2024-06-12 NOTE — Plan of Care (Signed)

## 2024-06-12 NOTE — Progress Notes (Signed)
 Michales, Arihaan his blood glucose is 334, CBG have been uncontrolled, patient refused his evening Novalog 70/30, that was recommended bu his diabetes coordinator patient said he will take the short acting insulin . Notified on call NP. Orders received.

## 2024-06-12 NOTE — Progress Notes (Signed)
 Progress Note   Patient: Andrew Buchanan FMW:[id number] DOB: Feb 07, 1971 DOA: 06/09/2024     3 DOS: the patient was seen and examined on 06/12/2024   Brief hospital course: HPI on admission 06/09/24 PM: Jaysten Essner is a 53 y.o. male with medical history significant for/hypertension, type 2 diabetes mellitus anxiety, alcohol abuse and pancreatitis, who presented to the emergency room with acute onset of hypoglycemia with associated polyuria and polydipsia.  He has been having posterior neck swelling, warmth tenderness and pain.  He has been having chills.  Admitted to nausea and vomiting without diarrhea.  No blood in his vomitus or hematemesis.  No chest pain or palpitations.  He has bilateral plantar feet wounds and has been followed by podiatry.   ED Course: Upon presentation to the ER, BP was 139/110 with otherwise normal vital signs.  Labs revealed mild hyponatremia 125 and hypochloremia of 90 with hypokalemia at 2.8 and blood glucose of 343 with CO2 of 19 and anion gap of 16, calcium  8.1 and magnesium  1.2 with AST 45 and total bili 1.3.  UA showed more than 500 glucose and 30 protein. EKG as reviewed by me : EKG showed sinus rhythm with a rate of 83 with borderline left axis deviation. Imaging: None.   The patient was given 4 mg of IV Rocephin  and 4 g of IV rhythm sulfate, 1 L bolus of IV lactated Ringer , 10 units subcutaneous NovoLog , 40 mL, p.o. potassium chloride  and 1 g of IV Rocephin .  He will be admitted to a progressive care bed for further evaluation and management.  Patient was admitted and started on IV antibiotics for neck abscess with cellulitis. Insulin  drip was not initiated on admission.  Pt noted to have a hypoglycemic episode, dropping glucose to 38 overnight at 2324, approximately 3 hours after being given 10 units subcutaneous Novolog .    06/10/34 -- pt glucose up to above 400 this afternoon.  Pt refusing to take ordered insulin .  Neck abscess I&D by general  surgeon, no significant fluid to send for culture. Pt may ultimately require insulin  drip in stepdown if    Assessment and Plan:  * DKA, type 2 -- mild, early -- on admission. Pt was given 10 units Novolog , had hypoglycemia to 38.  Anion gap closed on repeat metabolic panel. Suspect hyperglycemia related to infection and ?compliance with diabetes regimen. Type 2 diabetes, poorly controlled - last A1c in May 7.4% 06/10/24 -- pt refusing long-acting insulin  with glucose up to 378 this afternoon New A1c is 9.5% -- very poorly controlled 06/11/24 - labile sugars, adjusting regimen, starting 70/30 Patient refusing lantus  CBG (last 3)  Recent Labs    06/12/24 0812 06/12/24 1200 06/12/24 1639  GLUCAP 481* 332* 148*   BG poorly controlled.  Continue 70/30 insulin  Patient will likely benefit from CBG and potentially insulin  pump  --Holding home metformin , Actos  --Titrate regimen --Hypoglycemia protocol  Cellulitis and abscess of neck --General surgery performed bedside I&D on 9/3.  Not enough fluid for culture. --Continue PO Augmentin  & Doxycycline   --Warm compresses --Pain control PRN  Bilateral foot wounds, chronic --Followed by Podiatry, but by chart review has not been adherent with their recommendations --Images taken on admission reviewed - wounds show no signs of infection --Wound care consulted - see orders  Macrocytic anemia  Stable. Check iron panel. Recent folate b12 wnl.   Thrombocytopenia - suspect alcohol induced myelosuppression and/or infection --Monitor CBC  Hyponatremia  Mild. CTM  Hypokalemia - Resolved  Hypomagnesemia - Replace   Anxiety --Continue home Klonopin  PRN  Essential hypertension --Continue home regimen --Hold meds if MAP<65  Alcohol use disorder  Counseled  on admission regarding cessation. --Continue CIWA protocol for monitoring & management        Subjective: NO acute events over night. Anxious about diabetes regimen given  hypoglycemic episodes.    Physical Exam: Vitals:   06/12/24 0330 06/12/24 0625 06/12/24 0807 06/12/24 1651  BP: 115/74 127/78 138/83 119/74  Pulse: 77 (!) 104 81 63  Resp: 17 18 17 19   Temp: 98.1 F (36.7 C) 97.8 F (36.6 C) 98 F (36.7 C) 97.8 F (36.6 C)  TempSrc:   Oral Oral  SpO2: 100% 100% 100% 100%  Weight:      Height:         Constitutional: In no distress. HEENT: midline wound small induration  Cardiovascular: Normal rate, regular rhythm. No lower extremity edema  Pulmonary: Non labored breathing on room air, no wheezing or rales.   Abdominal: Soft. Non distended and non tender Musculoskeletal: Normal range of motion.     Neurological: Alert and oriented to person, place, and time. Non focal  Skin: Skin is warm and dry.    Data Reviewed:  Notable labs --     Family Communication: None present. Pt declines for me to call anyone.  Disposition: Status is: Inpatient Remains inpatient appropriate because: labile blood sugars with multiple severely elevated readings and hypoglycemic episodes.     Planned Discharge Destination: Home    Time spent: 35 minutes including time at bedside and in coordination of care with staff and consultants.  Author: Alban Pepper, MD 06/12/2024 6:19 PM  For on call review [email or web address].

## 2024-06-13 DIAGNOSIS — E1110 Type 2 diabetes mellitus with ketoacidosis without coma: Secondary | ICD-10-CM | POA: Diagnosis not present

## 2024-06-13 LAB — CBC
HCT: 28.7 % — ABNORMAL LOW (ref 39.0–52.0)
Hemoglobin: 9.9 g/dL — ABNORMAL LOW (ref 13.0–17.0)
MCH: 36 pg — ABNORMAL HIGH (ref 26.0–34.0)
MCHC: 34.5 g/dL (ref 30.0–36.0)
MCV: 104.4 fL — ABNORMAL HIGH (ref 80.0–100.0)
Platelets: 119 K/uL — ABNORMAL LOW (ref 150–400)
RBC: 2.75 MIL/uL — ABNORMAL LOW (ref 4.22–5.81)
RDW: 11.9 % (ref 11.5–15.5)
WBC: 5.6 K/uL (ref 4.0–10.5)
nRBC: 0 % (ref 0.0–0.2)

## 2024-06-13 LAB — GLUCOSE, CAPILLARY
Glucose-Capillary: 404 mg/dL — ABNORMAL HIGH (ref 70–99)
Glucose-Capillary: 379 mg/dL — ABNORMAL HIGH (ref 70–99)
Glucose-Capillary: 297 mg/dL — ABNORMAL HIGH (ref 70–99)
Glucose-Capillary: 226 mg/dL — ABNORMAL HIGH (ref 70–99)
Glucose-Capillary: 248 mg/dL — ABNORMAL HIGH (ref 70–99)

## 2024-06-13 LAB — IRON AND TIBC
Iron: 54 ug/dL (ref 45–182)
Saturation Ratios: 23 % (ref 17.9–39.5)
TIBC: 232 ug/dL — ABNORMAL LOW (ref 250–450)
UIBC: 178 ug/dL

## 2024-06-13 LAB — FERRITIN: Ferritin: 116 ng/mL (ref 24–336)

## 2024-06-13 MED ORDER — TRAZODONE HCL 50 MG PO TABS
50.0000 mg | ORAL_TABLET | Freq: Once | ORAL | Status: DC
  Filled 2024-06-13: qty 1

## 2024-06-13 MED ORDER — TRAZODONE HCL 50 MG PO TABS
50.0000 mg | ORAL_TABLET | Freq: Every evening | ORAL | Status: DC | PRN
  Administered 2024-06-13 – 2024-06-14 (×3): 50 mg via ORAL
  Filled 2024-06-13 (×2): qty 1

## 2024-06-13 MED ORDER — INSULIN ASPART PROT & ASPART (70-30 MIX) 100 UNIT/ML ~~LOC~~ SUSP
12.0000 [IU] | Freq: Two times a day (BID) | SUBCUTANEOUS | Status: DC
  Administered 2024-06-13 – 2024-06-15 (×4): 12 [IU] via SUBCUTANEOUS
  Filled 2024-06-13: qty 10

## 2024-06-13 NOTE — Progress Notes (Signed)
 Progress Note   Patient: Andrew Buchanan FMW:[id number] DOB: May 01, 1971 DOA: 06/09/2024     4 DOS: the patient was seen and examined on 06/13/2024   Brief hospital course: HPI on admission 06/09/24 PM: Andrew Buchanan is a 53 y.o. male with medical history significant for/hypertension, type 2 diabetes mellitus anxiety, alcohol abuse and pancreatitis, who presented to the emergency room with acute onset of hypoglycemia with associated polyuria and polydipsia.  He has been having posterior neck swelling, warmth tenderness and pain.  He has been having chills.  Admitted to nausea and vomiting without diarrhea.  No blood in his vomitus or hematemesis.  No chest pain or palpitations.  He has bilateral plantar feet wounds and has been followed by podiatry.   ED Course: Upon presentation to the ER, BP was 139/110 with otherwise normal vital signs.  Labs revealed mild hyponatremia 125 and hypochloremia of 90 with hypokalemia at 2.8 and blood glucose of 343 with CO2 of 19 and anion gap of 16, calcium  8.1 and magnesium  1.2 with AST 45 and total bili 1.3.  UA showed more than 500 glucose and 30 protein. EKG as reviewed by me : EKG showed sinus rhythm with a rate of 83 with borderline left axis deviation. Imaging: None.   The patient was given 4 mg of IV Rocephin  and 4 g of IV rhythm sulfate, 1 L bolus of IV lactated Ringer , 10 units subcutaneous NovoLog , 40 mL, p.o. potassium chloride  and 1 g of IV Rocephin .  He will be admitted to a progressive care bed for further evaluation and management.  Patient was admitted and started on IV antibiotics for neck abscess with cellulitis. Insulin  drip was not initiated on admission.  Pt noted to have a hypoglycemic episode, dropping glucose to 38 overnight at 2324, approximately 3 hours after being given 10 units subcutaneous Novolog .    06/10/34 -- pt glucose up to above 400 this afternoon.  Pt refusing to take ordered insulin .  Neck abscess I&D by general  surgeon, no significant fluid to send for culture. Pt may ultimately require insulin  drip in stepdown if    Assessment and Plan:  * DKA, type 2 -- mild, early -- on admission. Resolved  Type 2 diabetes, poorly controlled - last A1c in May 7.4% New A1c is 9.5% CBG (last 3)  Recent Labs    06/13/24 0743 06/13/24 1130 06/13/24 1603  GLUCAP 379* 297* 226*   Blood sugars remain poorly controlled.  Holding home metformin , Actos  Increase 70/30 insulin  Patient will likely benefit from CBG and potentially insulin  pump  Hypoglycemia protocol  Cellulitis and abscess of neck General surgery performed bedside I&D on 9/3.  Not enough fluid for culture. Continue PO Augmentin  & Doxycycline   Warm compresses --Pain control PRN  Bilateral foot wounds, chronic Followed by Podiatry, but by chart review has not been adherent with their recommendations Images taken on admission reviewed - wounds show no signs of infection Wound care consulted - see orders  Macrocytic anemia  Stable. Iron panel WNL. Recent folate b12 wnl.   Thrombocytopenia - suspect alcohol induced myelosuppression and/or infection Stable.   Hyponatremia  Mild. CTM    Anxiety --Continue home Klonopin  PRN  Essential hypertension --Continue home regimen --Hold meds if MAP<65  Alcohol use disorder  Counseled  on admission regarding cessation. --Continue CIWA protocol for monitoring & management      Subjective: No hypoglycemic episodes. Patient is anxious regarding BG going low and refusing insulin  intermittently. Discussed further with patient  about how he is taking is insulin  at home. It appears he would take multiple doses of novolog  if his BG did not respond resulting in likely stacking and hypoglycemia. Patient also would often over correct.    Physical Exam: Vitals:   06/13/24 0238 06/13/24 0352 06/13/24 0744 06/13/24 1546  BP: (!) 140/96 (!) 149/94 (!) 137/92 138/89  Pulse: 96 77 78 80  Resp:  18  18   Temp:  97.7 F (36.5 C) 98.3 F (36.8 C) 98.1 F (36.7 C)  TempSrc:  Oral Oral Oral  SpO2:  100% 100% 100%  Weight:      Height:        Constitutional: In no distress.  HEENT: Midline wound on back of neck, indurated small amount of purulent material expressed.  Cardiovascular: Normal rate, regular rhythm. No lower extremity edema  Pulmonary: Non labored breathing on room air, no wheezing or rales.   Abdominal: Soft. Non distended and non tender Musculoskeletal: Normal range of motion.     Neurological: Alert and oriented to person, place, and time. Non focal  Skin: Skin is warm and dry.   Data Reviewed:  Notable labs --     Family Communication: None present. Pt declines for me to call anyone.  Disposition: Status is: Inpatient Remains inpatient appropriate because: labile blood sugars with multiple severely elevated readings and hypoglycemic episodes.     Planned Discharge Destination: Home    Time spent: 35 minutes including time at bedside and in coordination of care with staff and consultants.  Author: Alban Pepper, MD 06/13/2024 6:24 PM  For on call review [email or web address].

## 2024-06-13 NOTE — TOC CM/SW Note (Signed)
 CSW noted SA consult from 9/3. SA resources have been added to patient's AVS.  Maile Linford, LCSW [phone]

## 2024-06-13 NOTE — Progress Notes (Signed)
 Patient introduced to role of nurse navigator. Patient verbally aggressive and cursing at nurse navigator. Patient asked nurse navigator to leave and not return.

## 2024-06-13 NOTE — Plan of Care (Signed)

## 2024-06-14 DIAGNOSIS — E1110 Type 2 diabetes mellitus with ketoacidosis without coma: Secondary | ICD-10-CM | POA: Diagnosis not present

## 2024-06-14 LAB — POTASSIUM
Potassium: 4.5 mmol/L (ref 3.5–5.1)
Potassium: 5.3 mmol/L — ABNORMAL HIGH (ref 3.5–5.1)
Potassium: 5.6 mmol/L — ABNORMAL HIGH (ref 3.5–5.1)
Potassium: 5.2 mmol/L — ABNORMAL HIGH (ref 3.5–5.1)

## 2024-06-14 LAB — CBC
HCT: 28.1 % — ABNORMAL LOW (ref 39.0–52.0)
Hemoglobin: 9.1 g/dL — ABNORMAL LOW (ref 13.0–17.0)
MCH: 35.4 pg — ABNORMAL HIGH (ref 26.0–34.0)
MCHC: 32.4 g/dL (ref 30.0–36.0)
MCV: 109.3 fL — ABNORMAL HIGH (ref 80.0–100.0)
Platelets: 163 K/uL (ref 150–400)
RBC: 2.57 MIL/uL — ABNORMAL LOW (ref 4.22–5.81)
RDW: 12 % (ref 11.5–15.5)
WBC: 5.5 K/uL (ref 4.0–10.5)
nRBC: 0 % (ref 0.0–0.2)

## 2024-06-14 LAB — BASIC METABOLIC PANEL WITH GFR
Anion gap: 8 (ref 5–15)
BUN: 14 mg/dL (ref 6–20)
CO2: 22 mmol/L (ref 22–32)
Calcium: 8.7 mg/dL — ABNORMAL LOW (ref 8.9–10.3)
Chloride: 98 mmol/L (ref 98–111)
Creatinine, Ser: 1.48 mg/dL — ABNORMAL HIGH (ref 0.61–1.24)
GFR, Estimated: 56 mL/min — ABNORMAL LOW (ref >60)
Glucose, Bld: 678 mg/dL (ref 70–99)
Potassium: 6.4 mmol/L (ref 3.5–5.1)
Sodium: 128 mmol/L — ABNORMAL LOW (ref 135–145)

## 2024-06-14 LAB — GLUCOSE, CAPILLARY
Glucose-Capillary: >600 mg/dL (ref 70–99)
Glucose-Capillary: 55 mg/dL — ABNORMAL LOW (ref 70–99)
Glucose-Capillary: 55 mg/dL — ABNORMAL LOW (ref 70–99)
Glucose-Capillary: 83 mg/dL (ref 70–99)
Glucose-Capillary: 98 mg/dL (ref 70–99)
Glucose-Capillary: 126 mg/dL — ABNORMAL HIGH (ref 70–99)
Glucose-Capillary: 209 mg/dL — ABNORMAL HIGH (ref 70–99)
Glucose-Capillary: 296 mg/dL — ABNORMAL HIGH (ref 70–99)
Glucose-Capillary: 313 mg/dL — ABNORMAL HIGH (ref 70–99)

## 2024-06-14 LAB — MAGNESIUM: Magnesium: 1.4 mg/dL — ABNORMAL LOW (ref 1.7–2.4)

## 2024-06-14 MED ORDER — INSULIN ASPART 100 UNIT/ML IV SOLN
10.0000 [IU] | Freq: Once | INTRAVENOUS | Status: AC
  Administered 2024-06-14: 10 [IU] via INTRAVENOUS
  Filled 2024-06-14: qty 0.1

## 2024-06-14 MED ORDER — MAGNESIUM SULFATE 2 GM/50ML IV SOLN
2.0000 g | Freq: Once | INTRAVENOUS | Status: AC
  Administered 2024-06-14: 2 g via INTRAVENOUS
  Filled 2024-06-14: qty 50

## 2024-06-14 MED ORDER — PROMETHAZINE (PHENERGAN) 6.25MG IN NS 50ML IVPB
6.2500 mg | Freq: Four times a day (QID) | INTRAVENOUS | Status: DC | PRN
  Administered 2024-06-14: 6.25 mg via INTRAVENOUS
  Filled 2024-06-14: qty 6.25

## 2024-06-14 MED ORDER — LACTATED RINGERS IV SOLN: INTRAVENOUS | Status: DC

## 2024-06-14 NOTE — Plan of Care (Signed)

## 2024-06-14 NOTE — Progress Notes (Signed)
 Notified MD and primary nurse of potassium 6.4 and glucose 678.

## 2024-06-14 NOTE — Progress Notes (Signed)
 Progress Note   Patient: Andrew Buchanan FMW:[id number] DOB: 1971-04-12 DOA: 06/09/2024     5 DOS: the patient was seen and examined on 06/14/2024   Brief hospital course: HPI on admission 06/09/24 PM: Andrew Buchanan is a 53 y.o. male with medical history significant for/hypertension, type 2 diabetes mellitus anxiety, alcohol abuse and pancreatitis, who presented to the emergency room with acute onset of hypoglycemia with associated polyuria and polydipsia.  He has been having posterior neck swelling, warmth tenderness and pain.  He has been having chills.  Admitted to nausea and vomiting without diarrhea.  No blood in his vomitus or hematemesis.  No chest pain or palpitations.  He has bilateral plantar feet wounds and has been followed by podiatry.   ED Course: Upon presentation to the ER, BP was 139/110 with otherwise normal vital signs.  Labs revealed mild hyponatremia 125 and hypochloremia of 90 with hypokalemia at 2.8 and blood glucose of 343 with CO2 of 19 and anion gap of 16, calcium  8.1 and magnesium  1.2 with AST 45 and total bili 1.3.  UA showed more than 500 glucose and 30 protein. EKG as reviewed by me : EKG showed sinus rhythm with a rate of 83 with borderline left axis deviation. Imaging: None.   The patient was given 4 mg of IV Rocephin  and 4 g of IV rhythm sulfate, 1 L bolus of IV lactated Ringer , 10 units subcutaneous NovoLog , 40 mL, p.o. potassium chloride  and 1 g of IV Rocephin .  He will be admitted to a progressive care bed for further evaluation and management.  Patient was admitted and started on IV antibiotics for neck abscess with cellulitis. Insulin  drip was not initiated on admission.  Pt noted to have a hypoglycemic episode, dropping glucose to 38 overnight at 2324, approximately 3 hours after being given 10 units subcutaneous Novolog .    06/10/34 -- pt glucose up to above 400 this afternoon.  Pt refusing to take ordered insulin .  Neck abscess I&D by general  surgeon, no significant fluid to send for culture. Pt may ultimately require insulin  drip in stepdown if   9/7 blood glucose up to 600 on BMP, potassium 6.4 Assessment and Plan:  * DKA, type 2 -- mild, early -- on admission. Resolved  Type 2 diabetes, poorly controlled - last A1c in May 7.4% New A1c is 9.5% Patient noted that he often will redose his sliding scale insulin  after 15 minutes if his blood sugar remains elevated resulting in stacking and hypoglycemic episodes.   Blood sugars here remain very brittle.  Blood glucose up to 600 this a.m.  He received NovoLog  10 units and his blood glucose was 55.  Patient has very poor health literacy and needs a lot of education regarding his diabetes attempted to place dexcom G6 but patient does not have transmitter here in the hospital.   Will continue 70/30 insulin  Ideally patient would discharge with dexcom G6 set up and more education regarding when to use SSI and how to correct low blood sugars to prevent overcorrection.  Hold home metformin , Actos   HTN    06/14/2024    3:13 PM 06/14/2024    7:38 AM 06/14/2024    4:50 AM  Vitals with BMI  Systolic 131 157 850  Diastolic 93 98 99  Pulse 63 93 88   Held enalapril  with hyperkalemia and AKI. Resume as able.   Hyperkalemia Improved with IV fluids and insulin .   Cellulitis and abscess of neck  status post  bedside I&D on 9/3 with general surgery.  Not enough fluid for culture. No fever and no leukocytosis. Continue PO Augmentin  & Doxycycline  (d4 of antibiotics) He received 1 d of cefepime  and vancomycin  as well.  Warm compresses --Pain control PRN  Bilateral foot wounds, chronic Followed by Podiatry, but by chart review has not been adherent with their recommendations Images taken on admission reviewed - wounds show no signs of infection Wound care consulted - see orders  Macrocytic anemia  Stable. Iron panel WNL. Recent folate b12 wnl.  Suspect related to alcohol and or  infection  Thrombocytopenia -resolved  suspect alcohol induced myelosuppression and/or infection Stable.   Hyponatremia  Worsened in the setting of hyperglycemia. Will continue to monitor   AKI In the setting of hyperglycemia.  Patient was started on IV fluids. Will check BMP in the a.m.  Anxiety --Continue home Klonopin  PRN   Essential hypertension --Continue home regimen --Hold meds if MAP<65  Alcohol use disorder  Counseled  on admission regarding cessation. --Continue CIWA protocol for monitoring & management      Subjective: No issues overnight.    Physical Exam: Vitals:   06/13/24 2048 06/14/24 0450 06/14/24 0738 06/14/24 1513  BP: (!) 141/90 (!) 149/99 (!) 157/98 (!) 131/93  Pulse:  88 93 63  Resp:  18 17 18   Temp:  98.6 F (37 C) 98.4 F (36.9 C) 98.1 F (36.7 C)  TempSrc:  Oral  Oral  SpO2:  95% 100% 100%  Weight:      Height:        Physical Exam  Constitutional: In no distress.  HEENT: Midline neck wound, mild induration around incision site, no surrounding erythema or warmth. TTP  Cardiovascular: Normal rate, regular rhythm. No lower extremity edema  Pulmonary: Non labored breathing on room air, no wheezing or rales.   Abdominal: Soft. Non distended and non tender Musculoskeletal: Normal range of motion.     Neurological: Alert and oriented to person, place, and time. Non focal  Skin: Bilateral feet bandaged c/d/I    Data Reviewed:  Notable labs --   K 6.4   Family Communication: None present. Pt declines for me to call anyone.  Disposition: Status is: Inpatient Remains inpatient appropriate because: labile blood sugars with multiple severely elevated readings and hypoglycemic episodes.     Planned Discharge Destination: Home    Time spent: 35 minutes including time at bedside and in coordination of care with staff and consultants.  Author: Alban Pepper, MD 06/14/2024 6:12 PM  For on call review [email or web address].

## 2024-06-14 NOTE — Progress Notes (Signed)
 Pt CBG > than 600 and confirmed with lab draw. MD aware and new orders received. Pt hypoglycemic on next check and hypoglycemia standing orders followed. Blood sugars recovered after more than 60 g total carbs. Blood sugar checks more frequent. C/O nausea and foot pain. PRNs administered per orders. Pt is very agitated throughout shift. C/o not having slept in 3 days and not wanting to be bothered. RN educated pt on needs for care, but explained we will cluster as much as possible. Very boisterous. Non compliant at times regarding care, and educated.   Eats food when told he shouldn't/ sugars elevated. Tells RN he wants his sugars to be in the 200s. Tells RN his feet and organs dont matter, that he is dying anyway. As well as saying he likes and feels great when his sugars are in the 600s. RN educated pt on diabetes and disease process. Will continue to monitor.

## 2024-06-15 DIAGNOSIS — E1110 Type 2 diabetes mellitus with ketoacidosis without coma: Secondary | ICD-10-CM | POA: Diagnosis not present

## 2024-06-15 LAB — MAGNESIUM: Magnesium: 1.7 mg/dL (ref 1.7–2.4)

## 2024-06-15 LAB — CBC
HCT: 30.5 % — ABNORMAL LOW (ref 39.0–52.0)
Hemoglobin: 10.1 g/dL — ABNORMAL LOW (ref 13.0–17.0)
MCH: 35.8 pg — ABNORMAL HIGH (ref 26.0–34.0)
MCHC: 33.1 g/dL (ref 30.0–36.0)
MCV: 108.2 fL — ABNORMAL HIGH (ref 80.0–100.0)
Platelets: 226 K/uL (ref 150–400)
RBC: 2.82 MIL/uL — ABNORMAL LOW (ref 4.22–5.81)
RDW: 11.9 % (ref 11.5–15.5)
WBC: 7 K/uL (ref 4.0–10.5)
nRBC: 0 % (ref 0.0–0.2)

## 2024-06-15 LAB — GLUCOSE, CAPILLARY
Glucose-Capillary: 137 mg/dL — ABNORMAL HIGH (ref 70–99)
Glucose-Capillary: 188 mg/dL — ABNORMAL HIGH (ref 70–99)
Glucose-Capillary: 243 mg/dL — ABNORMAL HIGH (ref 70–99)
Glucose-Capillary: 271 mg/dL — ABNORMAL HIGH (ref 70–99)
Glucose-Capillary: 301 mg/dL — ABNORMAL HIGH (ref 70–99)
Glucose-Capillary: 322 mg/dL — ABNORMAL HIGH (ref 70–99)
Glucose-Capillary: 330 mg/dL — ABNORMAL HIGH (ref 70–99)
Glucose-Capillary: 339 mg/dL — ABNORMAL HIGH (ref 70–99)

## 2024-06-15 LAB — BASIC METABOLIC PANEL WITH GFR
Anion gap: 9 (ref 5–15)
BUN: 13 mg/dL (ref 6–20)
CO2: 24 mmol/L (ref 22–32)
Calcium: 9.2 mg/dL (ref 8.9–10.3)
Chloride: 98 mmol/L (ref 98–111)
Creatinine, Ser: 1.35 mg/dL — ABNORMAL HIGH (ref 0.61–1.24)
GFR, Estimated: >60 mL/min (ref >60)
Glucose, Bld: 369 mg/dL — ABNORMAL HIGH (ref 70–99)
Potassium: 5.4 mmol/L — ABNORMAL HIGH (ref 3.5–5.1)
Sodium: 131 mmol/L — ABNORMAL LOW (ref 135–145)

## 2024-06-15 LAB — PROCALCITONIN: Procalcitonin: <0.1 ng/mL

## 2024-06-15 MED ORDER — INSULIN ASPART PROT & ASPART (70-30 MIX) 100 UNIT/ML ~~LOC~~ SUSP
15.0000 [IU] | Freq: Two times a day (BID) | SUBCUTANEOUS | Status: DC
  Administered 2024-06-15 – 2024-06-16 (×2): 15 [IU] via SUBCUTANEOUS
  Filled 2024-06-15: qty 10

## 2024-06-15 MED ORDER — LORAZEPAM 2 MG/ML IJ SOLN
2.0000 mg | Freq: Once | INTRAMUSCULAR | Status: AC
  Administered 2024-06-15: 2 mg via INTRAVENOUS
  Filled 2024-06-15: qty 1

## 2024-06-15 MED ORDER — TRAZODONE HCL 50 MG PO TABS
50.0000 mg | ORAL_TABLET | Freq: Every day | ORAL | Status: DC
  Administered 2024-06-15: 50 mg via ORAL
  Filled 2024-06-15: qty 1

## 2024-06-15 MED ORDER — DIPHENHYDRAMINE HCL 25 MG PO CAPS
25.0000 mg | ORAL_CAPSULE | Freq: Every evening | ORAL | Status: DC | PRN
  Filled 2024-06-15: qty 1

## 2024-06-15 MED ORDER — CLONAZEPAM 1 MG PO TABS
1.0000 mg | ORAL_TABLET | Freq: Once | ORAL | Status: AC
  Administered 2024-06-15: 1 mg via ORAL
  Filled 2024-06-15: qty 1

## 2024-06-15 MED ORDER — TRAZODONE HCL 50 MG PO TABS
50.0000 mg | ORAL_TABLET | Freq: Every day | ORAL | Status: DC | PRN
  Administered 2024-06-15: 50 mg via ORAL
  Filled 2024-06-15: qty 1

## 2024-06-15 MED ORDER — TRAZODONE HCL 50 MG PO TABS
25.0000 mg | ORAL_TABLET | Freq: Once | ORAL | Status: AC
  Administered 2024-06-15: 25 mg via ORAL
  Filled 2024-06-15: qty 1

## 2024-06-15 NOTE — Plan of Care (Signed)
  Problem: Education: Goal: Knowledge of General Education information will improve Description: Including pain rating scale, medication(s)/side effects and non-pharmacologic comfort measures Outcome: Not Progressing   Problem: Health Behavior/Discharge Planning: Goal: Ability to manage health-related needs will improve Outcome: Not Progressing   Problem: Coping: Goal: Level of anxiety will decrease Outcome: Not Progressing   

## 2024-06-15 NOTE — Progress Notes (Signed)
 PT Cancellation Note  Patient Details Name: Domanik Rainville MRN: [id number] DOB: 1971/09/22   Cancelled Treatment:    Reason Eval/Treat Not Completed: Other (comment): Pt fast asleep. pt is very difficult and non complaint.  Nurse and MD adivsed against waking pt 2/2 to pt has not slept fo few days. Will attempt later if pt cooperatesa nd awake   Shirly Bartosiewicz H Emmie Frakes 06/15/2024, 3:00 PM

## 2024-06-15 NOTE — Plan of Care (Signed)
   Problem: Clinical Measurements: Goal: Will remain free from infection Outcome: Progressing Goal: Diagnostic test results will improve Outcome: Progressing   Problem: Nutrition: Goal: Adequate nutrition will be maintained Outcome: Progressing   Problem: Coping: Goal: Level of anxiety will decrease Outcome: Progressing

## 2024-06-15 NOTE — Progress Notes (Signed)
 PROGRESS NOTE    Keyonte Cookston  FMW:[id number] DOB: August 09, 1971 DOA: 06/09/2024 PCP: System, Provider Not In    Brief Narrative:   Tahsin Benyo is a 53 y.o. male with medical history significant for/hypertension, type 2 diabetes mellitus anxiety, alcohol abuse and pancreatitis, who presented to the emergency room with acute onset of hypoglycemia with associated polyuria and polydipsia.  He has been having posterior neck swelling, warmth tenderness and pain.  He has been having chills.  Admitted to nausea and vomiting without diarrhea.  No blood in his vomitus or hematemesis.  No chest pain or palpitations.  He has bilateral plantar feet wounds and has been followed by podiatry.   ED Course: Upon presentation to the ER, BP was 139/110 with otherwise normal vital signs.  Labs revealed mild hyponatremia 125 and hypochloremia of 90 with hypokalemia at 2.8 and blood glucose of 343 with CO2 of 19 and anion gap of 16, calcium  8.1 and magnesium  1.2 with AST 45 and total bili 1.3.  UA showed more than 500 glucose and 30 protein. EKG as reviewed by me : EKG showed sinus rhythm with a rate of 83 with borderline left axis deviation. Imaging: None.   The patient was given 4 mg of IV Rocephin  and 4 g of IV rhythm sulfate, 1 L bolus of IV lactated Ringer , 10 units subcutaneous NovoLog , 40 mL, p.o. potassium chloride  and 1 g of IV Rocephin .  He will be admitted to a progressive care bed for further evaluation and management.   Patient was admitted and started on IV antibiotics for neck abscess with cellulitis. Insulin  drip was not initiated on admission.  Pt noted to have a hypoglycemic episode, dropping glucose to 38 overnight at 2324, approximately 3 hours after being given 10 units subcutaneous Novolog .      06/10/34 -- pt glucose up to above 400 this afternoon.  Pt refusing to take ordered insulin .  Neck abscess I&D by general surgeon, no significant fluid to send for culture. Pt may  ultimately require insulin  drip in stepdown if    9/7 blood glucose up to 600 on BMP, potassium 6.4  9/8: Patient very agitated this morning.  States that he has not slept since Thursday of last week.  Sugars still poorly controlled but improved from prior.  Remains labile.   Assessment & Plan:   Principal Problem:   DKA, type 2 (HCC) Active Problems:   Cellulitis and abscess of neck   Hypokalemia   Hypomagnesemia   Anxiety   Alcohol abuse   Essential hypertension   Neck abscess  * DKA, type 2 -- mild, early -- on admission. Resolved  Type 2 diabetes, poorly controlled -hemoglobin A1c 9.5 Patient's sugars are highly labile.  He has adamantly refused any long-acting insulin .  Does agree to 70/30.  Glycemic control has improved. Plan: Continue 70/30, dose increased to 15 units twice daily Sensitive sliding scale Continue CBG every 4 hour  AKI Improving Hold enalapril   Hyperkalemia Improving Hold enalapril  Control sugars  Cellulitis and abscess of posterior neck Status post bedside I&D 9/3 None of fluid for culture No fever or leukocytosis to suggest sepsis Transition to p.o. antibiotics on 9/7 Continue p.o. doxycycline  and Augmentin  Warm compress as needed  Chronic bilateral foot wounds Followed by podiatry.  Chart review indicates patient is not here and to weight-bear recommendations.  Images reviewed.  Wounds appear clean at this moment.  Wound care consulted.  Orders appreciated. Discussed case with patient's PCP.  Given  the patient is nonadherent to wound care and weightbearing restrictions he may benefit from placement in facility if he is agreeable.  Will reengage with therapy services.  Unclear whether patient meets criteria for skilled nursing facility or not  Microcytic anemia Unclear etiology.  Could be related to previous alcohol use.  Iron panel stable, B12 folate stable  Thrombocytopenia Mild, asymptomatic Resolved  Hyponatremia Improved Control  sugars  Severe anxiety Home Klonopin   Alcohol use disorder No signs of withdrawal Unknown last use  DVT prophylaxis: Lovenox  Code Status: Full Family Communication: None today.  Spoke with patient's PCP via phone Disposition Plan: Status is: Inpatient Remains inpatient appropriate because: Glycemic control.  Anticipate discharge 9/9.   Level of care: Telemetry Medical  Consultants:  None  Procedures:  None  Antimicrobials: Augmentin  Doxycycline    Subjective: Seen and examined.  Very pressured speech.  Patient states that he has not slept since Thursday.  I am not doing well  Objective: Vitals:   06/14/24 1513 06/14/24 2120 06/15/24 0435 06/15/24 0918  BP: (!) 131/93 (!) 126/94 (!) 135/93 127/80  Pulse: 63 82 76 76  Resp: 18 17 18 18   Temp: 98.1 F (36.7 C) (!) 97.4 F (36.3 C) 98 F (36.7 C) 98.8 F (37.1 C)  TempSrc: Oral Oral Oral Oral  SpO2: 100% 100% 100% 100%  Weight:      Height:        Intake/Output Summary (Last 24 hours) at 06/15/2024 1413 Last data filed at 06/15/2024 0900 Gross per 24 hour  Intake 1476.53 ml  Output 300 ml  Net 1176.53 ml   Filed Weights   06/09/24 1906  Weight: 64.7 kg    Examination:  General exam: Appears anxious Respiratory system: Clear to auscultation. Respiratory effort normal. Cardiovascular system: S1-2, RRR, no murmurs, no pedal edema Gastrointestinal system: Soft, NT/ND, normal bowel sounds Central nervous system: Alert and oriented. No focal neurological deficits. Extremities: Symmetric 5 x 5 power.  Bilateral feet bandaged Skin: Wound on posterior neck Psychiatry: Judgement and insight appear impaired. Mood & affect agitated.     Data Reviewed: I have personally reviewed following labs and imaging studies  CBC: Recent Labs  Lab 06/09/24 1915 06/10/24 0328 06/11/24 0543 06/12/24 0609 06/13/24 9385 06/14/24 0632 06/15/24 0826  WBC 9.9   < > 6.9 8.2 5.6 5.5 7.0  NEUTROABS 8.3*  --   --   --   --    --   --   HGB 12.4*   < > 9.9* 10.0* 9.9* 9.1* 10.1*  HCT 33.9*   < > 28.9* 29.9* 28.7* 28.1* 30.5*  MCV 99.1   < > 105.5* 105.7* 104.4* 109.3* 108.2*  PLT 142*   < > 93* 120* 119* 163 226   < > = values in this interval not displayed.   Basic Metabolic Panel: Recent Labs  Lab 06/09/24 1915 06/10/24 0328 06/10/24 1430 06/11/24 0543 06/11/24 1036 06/12/24 0609 06/14/24 9367 06/14/24 0950 06/14/24 1457 06/14/24 1952 06/14/24 2139 06/15/24 0826  NA 125* 133*   < > 129* 134* 132* 128*  --   --   --   --  131*  K 2.8* 3.8   < > 5.0 4.0 4.6 6.4* 4.5 5.3* 5.6* 5.2* 5.4*  CL 90* 99   < > 102 107 99 98  --   --   --   --  98  CO2 19* 21*   < > 20* 19* 24 22  --   --   --   --  24  GLUCOSE 343* 106*   < > 508* 105* 259* 678*  --   --   --   --  369*  BUN 13 11   < > 9 9 11 14   --   --   --   --  13  CREATININE 1.13 0.93   < > 1.19 1.10 1.18 1.48*  --   --   --   --  1.35*  CALCIUM  8.1* 8.3*   < > 8.3* 8.4* 8.9 8.7*  --   --   --   --  9.2  MG 1.2* 1.8  --   --   --  1.4* 1.4*  --   --   --   --  1.7   < > = values in this interval not displayed.   GFR: Estimated Creatinine Clearance: 57.9 mL/min (A) (by C-G formula based on SCr of 1.35 mg/dL (H)). Liver Function Tests: Recent Labs  Lab 06/09/24 1915  AST 45*  ALT 28  ALKPHOS 104  BILITOT 1.3*  PROT 8.1  ALBUMIN 3.7   Recent Labs  Lab 06/09/24 1915  LIPASE 19   No results for input(s): AMMONIA in the last 168 hours. Coagulation Profile: No results for input(s): INR, PROTIME in the last 168 hours. Cardiac Enzymes: No results for input(s): CKTOTAL, CKMB, CKMBINDEX, TROPONINI in the last 168 hours. BNP (last 3 results) No results for input(s): PROBNP in the last 8760 hours. HbA1C: No results for input(s): HGBA1C in the last 72 hours. CBG: Recent Labs  Lab 06/15/24 0027 06/15/24 0354 06/15/24 0732 06/15/24 0957 06/15/24 1219  GLUCAP 301* 339* 330* 322* 137*   Lipid Profile: No results for  input(s): CHOL, HDL, LDLCALC, TRIG, CHOLHDL, LDLDIRECT in the last 72 hours. Thyroid Function Tests: No results for input(s): TSH, T4TOTAL, FREET4, T3FREE, THYROIDAB in the last 72 hours. Anemia Panel: Recent Labs    06/13/24 0614  FERRITIN 116  TIBC 232*  IRON 54   Sepsis Labs: Recent Labs  Lab 06/15/24 0826  PROCALCITON <0.10    No results found for this or any previous visit (from the past 240 hours).       Radiology Studies: No results found.      Scheduled Meds:  amoxicillin -clavulanate  1 tablet Oral Q12H   doxycycline   100 mg Oral Q12H   folic acid   1 mg Oral Daily   gabapentin   600 mg Oral TID   insulin  aspart  0-6 Units Subcutaneous TID WC   insulin  aspart protamine- aspart  15 Units Subcutaneous BID WC   LORazepam   2 mg Intravenous Once   multivitamin with minerals  1 tablet Oral Daily   mupirocin  cream   Topical Daily   pantoprazole   40 mg Oral Daily   thiamine   100 mg Oral Daily   Or   thiamine   100 mg Intravenous Daily   Continuous Infusions:  promethazine  (PHENERGAN ) injection (IM or IVPB) Stopped (06/14/24 1700)   sodium chloride        LOS: 6 days     Calvin KATHEE Robson, MD [facility]   If 7PM-7AM, please contact night-coverage  06/15/2024, 2:13 PM

## 2024-06-15 NOTE — Inpatient Diabetes Management (Signed)
 Inpatient Diabetes Program Recommendations  AACE/ADA: New Consensus Statement on Inpatient Glycemic Control   Target Ranges:  Prepandial:   less than 140 mg/dL      Peak postprandial:   less than 180 mg/dL (1-2 hours)      Critically ill patients:  140 - 180 mg/dL    Discharge Recommendations: Intermediate acting recommendations: insulin  aspart protamine - aspart (NOVOLOG  70/30) FlexPen 15 UNITS BID    Use Adult Diabetes Insulin  Treatment Post Discharge order set.

## 2024-06-16 ENCOUNTER — Other Ambulatory Visit: Payer: Self-pay

## 2024-06-16 DIAGNOSIS — E1110 Type 2 diabetes mellitus with ketoacidosis without coma: Secondary | ICD-10-CM | POA: Diagnosis not present

## 2024-06-16 LAB — GLUCOSE, CAPILLARY
Glucose-Capillary: 330 mg/dL — ABNORMAL HIGH (ref 70–99)
Glucose-Capillary: 291 mg/dL — ABNORMAL HIGH (ref 70–99)
Glucose-Capillary: 233 mg/dL — ABNORMAL HIGH (ref 70–99)

## 2024-06-16 MED ORDER — AMOXICILLIN-POT CLAVULANATE 875-125 MG PO TABS
1.0000 | ORAL_TABLET | Freq: Two times a day (BID) | ORAL | 0 refills | Status: AC
  Filled 2024-06-16: qty 6, 3d supply, fill #0

## 2024-06-16 MED ORDER — HUMULIN 70/30 (70-30) 100 UNIT/ML ~~LOC~~ SUSP
18.0000 [IU] | Freq: Two times a day (BID) | SUBCUTANEOUS | 11 refills | Status: DC
  Filled 2024-06-16 (×2): qty 10, 28d supply, fill #0

## 2024-06-16 MED ORDER — CLONAZEPAM 1 MG PO TABS
1.0000 mg | ORAL_TABLET | Freq: Two times a day (BID) | ORAL | 0 refills | Status: DC | PRN
  Filled 2024-06-16: qty 14, 7d supply, fill #0

## 2024-06-16 MED ORDER — DOXYCYCLINE HYCLATE 100 MG PO TABS
100.0000 mg | ORAL_TABLET | Freq: Two times a day (BID) | ORAL | 0 refills | Status: AC
Start: 2024-06-16 — End: 2024-06-19
  Filled 2024-06-16: qty 6, 3d supply, fill #0

## 2024-06-16 MED ORDER — OXYCODONE-ACETAMINOPHEN 10-325 MG PO TABS
1.0000 | ORAL_TABLET | Freq: Four times a day (QID) | ORAL | 0 refills | Status: AC | PRN
  Filled 2024-06-16: qty 28, 7d supply, fill #0

## 2024-06-16 MED ORDER — DEXCOM G7 SENSOR MISC
1.0000 | 0 refills | Status: AC
  Filled 2024-06-16: qty 1, 10d supply, fill #0

## 2024-06-16 NOTE — Discharge Summary (Signed)
 Physician Discharge Summary  Andrew Buchanan FMW:[id number] DOB: 1971-06-02 DOA: 06/09/2024  PCP: System, Provider Not In  Admit date: 06/09/2024 Discharge date: 06/16/2024  Admitted From: Home Disposition:  Home  Recommendations for Outpatient Follow-up:  Follow up with PCP in 1-2 weeks   Home Health:No  Equipment/Devices:Cam boot, crutches   Discharge Condition:Stable  CODE STATUS:FULL  Diet recommendation: carb mod  Brief/Interim Summary:   Andrew Buchanan is a 53 y.o. male with medical history significant for/hypertension, type 2 diabetes mellitus anxiety, alcohol abuse and pancreatitis, who presented to the emergency room with acute onset of hypoglycemia with associated polyuria and polydipsia.  He has been having posterior neck swelling, warmth tenderness and pain.  He has been having chills.  Admitted to nausea and vomiting without diarrhea.  No blood in his vomitus or hematemesis.  No chest pain or palpitations.  He has bilateral plantar feet wounds and has been followed by podiatry.   ED Course: Upon presentation to the ER, BP was 139/110 with otherwise normal vital signs.  Labs revealed mild hyponatremia 125 and hypochloremia of 90 with hypokalemia at 2.8 and blood glucose of 343 with CO2 of 19 and anion gap of 16, calcium  8.1 and magnesium  1.2 with AST 45 and total bili 1.3.  UA showed more than 500 glucose and 30 protein. EKG as reviewed by me : EKG showed sinus rhythm with a rate of 83 with borderline left axis deviation. Imaging: None.   The patient was given 4 mg of IV Rocephin  and 4 g of IV rhythm sulfate, 1 L bolus of IV lactated Ringer , 10 units subcutaneous NovoLog , 40 mL, p.o. potassium chloride  and 1 g of IV Rocephin .  He will be admitted to a progressive care bed for further evaluation and management.   Patient was admitted and started on IV antibiotics for neck abscess with cellulitis. Insulin  drip was not initiated on admission.  Pt noted to have a  hypoglycemic episode, dropping glucose to 38 overnight at 2324, approximately 3 hours after being given 10 units subcutaneous Novolog .      06/10/34 -- pt glucose up to above 400 this afternoon.  Pt refusing to take ordered insulin .  Neck abscess I&D by general surgeon, no significant fluid to send for culture. Pt may ultimately require insulin  drip in stepdown if    9/7 blood glucose up to 600 on BMP, potassium 6.4   9/8: Patient very agitated this morning.  States that he has not slept since Thursday of last week.  Sugars still poorly controlled but improved from prior.  Remains labile.  9/9: Glycemic control appears improved on 70/30.  At time of discharge will recommend 18 units twice daily.  Dexcom sensors placed by diabetes coordinator.  Short course of p.o. antibiotics to complete total 10-day antibiotic course.  I spoke to patient's outpatient PCP via phone.  Patient has had a long history of labile sugars, difficult to control diabetes, anxiety and benzodiazepine dependence.  PCP expressed understanding of care complex chronic comorbidities.      Discharge Diagnoses:  Principal Problem:   DKA, type 2 (HCC) Active Problems:   Cellulitis and abscess of neck   Hypokalemia   Hypomagnesemia   Anxiety   Alcohol abuse   Essential hypertension   Neck abscess  * DKA, type 2 -- mild, early -- on admission. Resolved  Type 2 diabetes, poorly controlled -hemoglobin A1c 9.5 Patient's sugars are highly labile.  He has adamantly refused any long-acting insulin .  Does agree  to 70/30.  Glycemic control has improved. Plan: Discharge home Continue 70/30, dose increased to 18 units twice daily Encourage carb modified diet Dexcom sensor placed on discharge   AKI Improving Resume enalapril    Hyperkalemia Improving Resume enalapril  Control sugars   Cellulitis and abscess of posterior neck Status post bedside I&D 9/3 None of fluid for culture No fever or leukocytosis to suggest  sepsis Transition to p.o. antibiotics on 9/7 Completed 3 additional 3 days at time of discharge for total 10-day course Doxycycline  and Augmentin    Chronic bilateral foot wounds Followed by podiatry.  Chart review indicates patient is not adherent to weight-bear recommendations.  Images reviewed.  Wounds appear clean at this moment.  Wound care consulted.  Orders appreciated.  Discussed case with patient's PCP.  Given the patient is nonadherent to wound care and weightbearing restrictions he may benefit from placement in facility if he is agreeable.  Unfortunately our efforts were unsuccessful as patient was not willing to adhere to weightbearing restrictions and therefore therapy could not do a formal evaluation   Discharge Instructions  Discharge Instructions     Diet - low sodium heart healthy   Complete by: As directed    Increase activity slowly   Complete by: As directed    No wound care   Complete by: As directed       Allergies as of 06/16/2024       Reactions   Amlodipine Shortness Of Breath   Antihistamines, Chlorpheniramine-type Anaphylaxis   Statins Other (See Comments)   Pt declines        Medication List     STOP taking these medications    ALPRAZolam  0.5 MG tablet Commonly known as: XANAX    insulin  glargine 100 UNIT/ML Solostar Pen Commonly known as: LANTUS    insulin  lispro 100 UNIT/ML injection Commonly known as: HUMALOG   losartan  100 MG tablet Commonly known as: COZAAR    metFORMIN  1000 MG tablet Commonly known as: GLUCOPHAGE    multivitamin with minerals Tabs tablet   pantoprazole  40 MG tablet Commonly known as: PROTONIX        TAKE these medications    albuterol  108 (90 Base) MCG/ACT inhaler Commonly known as: VENTOLIN  HFA Inhale 1-2 puffs into the lungs every 4 (four) hours as needed.   amoxicillin -clavulanate 875-125 MG tablet Commonly known as: AUGMENTIN  Take 1 tablet by mouth 2 (two) times daily for 3 days.   blood glucose  meter kit and supplies Kit Dispense based on patient and insurance preference. Use up to four times daily as directed.   clonazePAM  1 MG tablet Commonly known as: KLONOPIN  Take 1 tablet (1 mg total) by mouth 2 (two) times daily as needed for up to 7 days. What changed: Another medication with the same name was removed. Continue taking this medication, and follow the directions you see here.   Dexcom G7 Sensor Misc Apply to skin as directed. Change every 10 days.   doxycycline  100 MG tablet Commonly known as: VIBRA -TABS Take 1 tablet (100 mg total) by mouth 2 (two) times daily for 3 days.   enalapril  20 MG tablet Commonly known as: VASOTEC  Take 20 mg by mouth 2 (two) times daily.   gabapentin  300 MG capsule Commonly known as: NEURONTIN  Take 2 capsules (600 mg total) by mouth 2 (two) times daily.   HumuLIN  70/30 (70-30) 100 UNIT/ML injection Generic drug: insulin  NPH-regular Human Inject 18 Units into the skin 2 (two) times daily with a meal.   Insulin  Pen Needle 31G X  5 MM Misc 1 each by Does not apply route 4 (four) times daily.   oxyCODONE -acetaminophen  10-325 MG tablet Commonly known as: PERCOCET Take 1 tablet by mouth every 6 (six) hours as needed for up to 7 days.   pioglitazone  15 MG tablet Commonly known as: ACTOS  Take 15 mg by mouth daily.        Allergies  Allergen Reactions   Amlodipine Shortness Of Breath   Antihistamines, Chlorpheniramine-Type Anaphylaxis   Statins Other (See Comments)    Pt declines    Consultations: None   Procedures/Studies: No results found.    Subjective: Seen and examined the day of discharge.  Stable no distress.  Appropriate discharge home.  Discharge Exam: Vitals:   06/16/24 0415 06/16/24 0738  BP: 114/83 (!) 135/103  Pulse: 75 78  Resp: 17 16  Temp: 98.8 F (37.1 C) 98.7 F (37.1 C)  SpO2: 100% 99%   Vitals:   06/15/24 1618 06/15/24 2018 06/16/24 0415 06/16/24 0738  BP: 113/77 123/88 114/83 (!) 135/103   Pulse: 93 89 75 78  Resp: 20 18 17 16   Temp: 99.2 F (37.3 C)  98.8 F (37.1 C) 98.7 F (37.1 C)  TempSrc:   Oral Oral  SpO2: 100% 100% 100% 99%  Weight:      Height:        General: Pt is alert, awake, not in acute distress Cardiovascular: RRR, S1/S2 +, no rubs, no gallops Respiratory: CTA bilaterally, no wheezing, no rhonchi Abdominal: Soft, NT, ND, bowel sounds + Extremities: no edema, no cyanosis    The results of significant diagnostics from this hospitalization (including imaging, microbiology, ancillary and laboratory) are listed below for reference.     Microbiology: No results found for this or any previous visit (from the past 240 hours).   Labs: BNP (last 3 results) No results for input(s): BNP in the last 8760 hours. Basic Metabolic Panel: Recent Labs  Lab 06/09/24 1915 06/10/24 0328 06/10/24 1430 06/11/24 0543 06/11/24 1036 06/12/24 0609 06/14/24 9367 06/14/24 0950 06/14/24 1457 06/14/24 1952 06/14/24 2139 06/15/24 0826  NA 125* 133*   < > 129* 134* 132* 128*  --   --   --   --  131*  K 2.8* 3.8   < > 5.0 4.0 4.6 6.4* 4.5 5.3* 5.6* 5.2* 5.4*  CL 90* 99   < > 102 107 99 98  --   --   --   --  98  CO2 19* 21*   < > 20* 19* 24 22  --   --   --   --  24  GLUCOSE 343* 106*   < > 508* 105* 259* 678*  --   --   --   --  369*  BUN 13 11   < > 9 9 11 14   --   --   --   --  13  CREATININE 1.13 0.93   < > 1.19 1.10 1.18 1.48*  --   --   --   --  1.35*  CALCIUM  8.1* 8.3*   < > 8.3* 8.4* 8.9 8.7*  --   --   --   --  9.2  MG 1.2* 1.8  --   --   --  1.4* 1.4*  --   --   --   --  1.7   < > = values in this interval not displayed.   Liver Function Tests: Recent Labs  Lab 06/09/24 1915  AST  45*  ALT 28  ALKPHOS 104  BILITOT 1.3*  PROT 8.1  ALBUMIN 3.7   Recent Labs  Lab 06/09/24 1915  LIPASE 19   No results for input(s): AMMONIA in the last 168 hours. CBC: Recent Labs  Lab 06/09/24 1915 06/10/24 0328 06/11/24 0543 06/12/24 0609  06/13/24 9385 06/14/24 0632 06/15/24 0826  WBC 9.9   < > 6.9 8.2 5.6 5.5 7.0  NEUTROABS 8.3*  --   --   --   --   --   --   HGB 12.4*   < > 9.9* 10.0* 9.9* 9.1* 10.1*  HCT 33.9*   < > 28.9* 29.9* 28.7* 28.1* 30.5*  MCV 99.1   < > 105.5* 105.7* 104.4* 109.3* 108.2*  PLT 142*   < > 93* 120* 119* 163 226   < > = values in this interval not displayed.   Cardiac Enzymes: No results for input(s): CKTOTAL, CKMB, CKMBINDEX, TROPONINI in the last 168 hours. BNP: Invalid input(s): POCBNP CBG: Recent Labs  Lab 06/15/24 2016 06/15/24 2337 06/16/24 0413 06/16/24 0830 06/16/24 1139  GLUCAP 243* 271* 330* 291* 233*   D-Dimer No results for input(s): DDIMER in the last 72 hours. Hgb A1c No results for input(s): HGBA1C in the last 72 hours. Lipid Profile No results for input(s): CHOL, HDL, LDLCALC, TRIG, CHOLHDL, LDLDIRECT in the last 72 hours. Thyroid function studies No results for input(s): TSH, T4TOTAL, T3FREE, THYROIDAB in the last 72 hours.  Invalid input(s): FREET3 Anemia work up No results for input(s): VITAMINB12, FOLATE, FERRITIN, TIBC, IRON, RETICCTPCT in the last 72 hours. Urinalysis    Component Value Date/Time   COLORURINE YELLOW (A) 06/09/2024 2017   APPEARANCEUR CLEAR (A) 06/09/2024 2017   APPEARANCEUR Clear 08/18/2014 1318   LABSPEC 1.020 06/09/2024 2017   LABSPEC 1.024 08/18/2014 1318   PHURINE 6.0 06/09/2024 2017   GLUCOSEU >=500 (A) 06/09/2024 2017   GLUCOSEU Negative 08/18/2014 1318   HGBUR NEGATIVE 06/09/2024 2017   BILIRUBINUR NEGATIVE 06/09/2024 2017   BILIRUBINUR Negative 08/18/2014 1318   KETONESUR NEGATIVE 06/09/2024 2017   PROTEINUR 30 (A) 06/09/2024 2017   NITRITE NEGATIVE 06/09/2024 2017   LEUKOCYTESUR NEGATIVE 06/09/2024 2017   LEUKOCYTESUR Negative 08/18/2014 1318   Sepsis Labs Recent Labs  Lab 06/12/24 0609 06/13/24 0614 06/14/24 0632 06/15/24 0826  WBC 8.2 5.6 5.5 7.0   Microbiology No  results found for this or any previous visit (from the past 240 hours).   Time coordinating discharge: 45 minutes  SIGNED:   Calvin KATHEE Robson, MD  [facility] 06/16/2024, 2:35 PM Pager   If 7PM-7AM, please contact night-coverage

## 2024-06-16 NOTE — Progress Notes (Signed)
 OT Cancellation Note  Patient Details Name: Andrew Buchanan MRN: [id number] DOB: Apr 14, 1971   Cancelled Treatment:    Reason Eval/Treat Not Completed: OT screened, no needs identified, will sign off. Pt received ambulating independently in room without AD; verbalizes understanding of NWB RLE with crutches recommendation but does not attempt to adhere or comply. Agitated, cursing at this author. Tall CAM walker boot delivered to room for LLE per discussion with MD, pt refuses to don. Pt states he has crutches and CAM boots at home (ruined from mowing the lawn), and does not plan on using crutches at discharge. Pt refuses participation in OT evaluation at this time, plans to discharge today. Secure chat to care team to update. OT will sign off, thank you for the referral.   [name], OTR/L  06/16/24, 10:26 AM

## 2024-06-16 NOTE — Inpatient Diabetes Management (Addendum)
 Inpatient Diabetes Program Recommendations  AACE/ADA: New Consensus Statement on Inpatient Glycemic Control   Target Ranges:  Prepandial:   less than 140 mg/dL      Peak postprandial:   less than 180 mg/dL (1-2 hours)      Critically ill patients:  140 - 180 mg/dL    Latest Reference Range & Units 06/15/24 07:32 06/15/24 09:57 06/15/24 12:19 06/15/24 17:14 06/15/24 20:16 06/15/24 23:37 06/16/24 04:13 06/16/24 08:30  Glucose-Capillary 70 - 99 mg/dL 669 (H) 677 (H) 862 (H) 188 (H) 243 (H) 271 (H) 330 (H) 291 (H)   Review of Glycemic Control  Diabetes history: DM2 Outpatient Diabetes medications: Humalog 3-4 times a day (not taking Metformin , Actos , or Lantus ) Current orders for Inpatient glycemic control: Novolog  0-6 units TID with meals, 70/30 15 units BID  Inpatient Diabetes Program Recommendations:    Insulin : Please consider increasing 70/30 to 18 units BID.  Discharge Recommendations: Intermediate acting recommendations: Insulin  Lispro Prot & Lispro (HUMALOG 75/25) Kwikpen Dose to be determined  Supply/Referral recommendations: Pen needles - standard Supply/Referral recommendations: Dexcom G7 sensors (#[id number])  Use Adult Diabetes Insulin  Treatment Post Discharge order set.  NOTE: Received message from Dr. Jhonny about using Dexcom G7 sensor on patient.  Spoke with patient at bedside regarding DM and using CGM. Patient reports that he has some Dexcom G6 sensors at home but he was not prescribed the transmitter so he is not able to use them. Discussed Dexcom G7 CGM and explained that he would not need a separate transmitter with the G7. Patient would like to use the Dexcom G7 and get started using it.  Educated patient on Dexcom G7 CGM regarding application and changing CGM sensor (alternate every 10 days on back of arms), 30 min warm-up, how to start a new sensor, and how to use app for glucose monitoring.  Patient has been given Dexcom G7 sensor sample and he was able to apply it  to back of left upper arm.    Patient plans to use android phone Dexcom G7 app to read sensor. Patient was able to download the app and create an account. Informed patient that it would be requested that attending provider provide Rx for first month of Dexcom G7 sensors and then his PCP would need to prescribe it going forward. Asked patient to be sure to let his PCP  know about Dexcom G7 and allow provider to review reports from [facility] G7 app so the provider can use the information to continue to make adjustments with DM medications if needed. Discussed glucose goals and encouraged patient to take 70/30 insulin  as prescribed. Discussed 70/30 insulin  in detail and how it works. Asked patient to be sure to roll the 70/30 pen between palms to mix the insulins prior to taking an insulin  injection. Discussed insulin  injection sites and importance of rotating sites. Patient states he has used insulin  pens and is aware of how to use it. Patient notes that he does not have drivers license so he misses appointments. Stressed to patient that he needs to see providers consistently so he can get help with managing DM and to ensure he can continue to get prescriptions filled.  Patient verbalized understanding of information and has no questions at this time.  Thanks, Earnie Gainer, RN, MSN, CDCES Diabetes Coordinator Inpatient Diabetes Program [phone] (Team Pager from 8am to 5pm)

## 2024-06-16 NOTE — Plan of Care (Signed)
  Problem: Education: Goal: Knowledge of General Education information will improve Description: Including pain rating scale, medication(s)/side effects and non-pharmacologic comfort measures Outcome: Not Progressing   Problem: Health Behavior/Discharge Planning: Goal: Ability to manage health-related needs will improve Outcome: Not Progressing   Problem: Nutrition: Goal: Adequate nutrition will be maintained Outcome: Not Progressing   

## 2024-06-16 NOTE — Progress Notes (Signed)
 PT Cancellation Note  Patient Details Name: Andrew Buchanan MRN: [id number] DOB: 01-Dec-1970   Cancelled Treatment:    Reason Eval/Treat Not Completed: Other (comment) (Pt on phone and will not cease conversation on the phone for PT evaluation.)Treatment attempted. SPT able to gather little information about PLOF. Pt is non compliant with WB precautions (NWB for RLE, CAM boot for LLE) d/t difficulty and discomfort of use of crutches and CAM boot. Pt took phone call during evaluation and would not cease the conversation. Care team notified. Will re-attempt evaluation tomorrow.  Lemoine Goyne, SPT  [facility] [place] 06/16/2024, 2:29 PM

## 2024-06-18 NOTE — Telephone Encounter (Signed)
 Please call patient. 70/30 insulin  is supposed to be cloudy. This is NOT a problem. I would recommend increasing the dose to 20 units twice daily until he sees Dr. Missy. Dr. Missy will see his refill request for other medications tomorrow. After you speak with patient, unless there is an urgent question, please send this message on to Dr. Missy

## 2024-06-18 NOTE — Telephone Encounter (Signed)
 Called patient and relayed message. Patient told me that he is not sure if he will listen to a different doctor than Dr. Missy. He asked if Missy was on vacation. I told him I had no idea of her vacation time. He would definitely benefit from some diabetic teaching or a referral. He was told that the 70/30 insulin  should be cloudy. He ask me if he should take the insulin  before or after he eats. I told him it is usually before the meal. I also told him if he takes the insulin  to eat something and not go without eating after taking the insulin .

## 2024-06-18 NOTE — Telephone Encounter (Signed)
 S: 53 yo male pt with elevated BG readings via G7Dexcom (left tricep place 06/16/24 during hospitalization at [facility]).  B: DM2, previously on regular insulin  15 units TID per pt, regimen now changed to 70/30. Pt is not taking pioglitazone  (ACTOS ) 15 MG tablet listed in record. Today's treatments:  Last dose 18 units of  70/30 insulin  at 06/18/24 @0700  Last dose 15 units of (regular) insulin  06/18/24 @1150 -took this because he was concerned with the cloudy appearance of the 70/30. Pt advised a cloudy appearance is expected for 70/30 insulin  by triage RN. A: Pt w/ High and 400+ readings) since before 0700 06/18/24.     Reading during triage call: June 18, 2024 1:01 PM 376            BG readings. All readings via G7 Dexcom. Unable to locate            a manual meter to verify.  R: Page for: Call PCP Now dispo      [1] Blood glucose > 300 mg/dL (83.2 mmol/L) AND [7] two or more times in a row Reason: Obtain doctor (or NP/PA) input regarding medication adjustment and diet.  Also: Pt is requesting refill for 06/21/24 for the following:  Has enough clonazePAM  (KLONOPIN ) 1 MG tablet  to take until 06/21/24 per pt Has enough oxyCODONE -acetaminophen  (PERCOCET) 10-325 mg per tablet to last until 9/14   [facility] [address]), [place] - [address] [place] [place]) [place] [address] Phone: [phone]  Fax: [phone]     Upcoming Appt: Future Appointments  Date Time Provider Department Center  06/30/2024  4:40 PM Missy Mallie Raring, MD [facility]  07/01/2024 11:30 AM Heisler, Garnette Bruckner, DPM [facility]    Disposition: Provider Paged, Call PCP Now  Encounter Reason for Disposition: .  [1] Blood glucose > 300 mg/dL (83.2 mmol/L) AND [7] two or more times in a row   Is this a pediatric patient?  No   Any recent, relevant visit?  Yes   Date/location of recent visit?  [facility]) Admitted  for elevated blood glucose management (with vomiting) Discharge from ED @1630  06/17/24 (5-6 days inpatient)    Any related medications? Yes   Name of medication and dose? G7 Dexcom continuous monitor attached 06/16/24 -back of left tricep Beeping and giving high message Looks murky (70/30) insulin  is milky looking   Advised patient per [place]  FDA/medication manufacturer : (Inspect the insulin . Humulin  70/30 suspension should look uniformly cloudy or milky. Do not use Humulin  70/30 if you notice anything unusual in its appearance) Humalog -taking this before his medications were changed   Any relevant medical history?  Yes   What history? DM2   Any interventions?  Yes   What has been done?  Last dose 18 units of  70/30 insulin  at 06/18/24 @0700  Last dose 15 units of (regular) insulin  06/18/24 @1150  Had snack for lunch (had a good breakfast  June 18, 2024 1:01 PM 376 BG readings  Has enough clonazePAM  (KLONOPIN ) 1 MG tablet  to take until 06/21/24 per pt Has enough oxyCODONE -acetaminophen  (PERCOCET) 10-325 mg per tablet to last until 9/14   [facility] [address] ([address]), [place] - [address]) [place] [address] Phone: [phone]  Fax: [phone]      [facility] Eligibility  Patient is eligible for [facility] (according to most recent zip code and insurance information)?  No  Patient is [facility] eligible and appropriate for referral: No   Encounter Initial Assessment: 1. BLOOD GLUCOSE: What is your blood glucose level?      400+-per monitor      Has been reading HIGH since before breakfast  2. ONSET: When did you check the blood glucose?     06/18/24 @1230  400+  3. USUAL RANGE: What is your glucose level usually? (e.g., usual fasting morning value, usual evening value)     130s has been results  4. KETONES: Do you check for ketones (urine or blood test strips)? If Yes, ask: What  does the test show now?      N/A -does not test  5. TYPE 1 or 2:  Do you know what type of diabetes you have?  (e.g., Type 1, Type 2, Gestational; doesn't know)      Type 2 6. INSULIN : Do you take insulin ? What type of insulin (s) do you use? What is the mode of delivery? (syringe, pen; injection or pump)?     18 units before breakfast and lunch 06/18/24    Gave self 18 units 70/30 -at 1150 06/18/24 and reading by G7 still says     High 7. DIABETES PILLS: Do you take any pills for your diabetes? If Yes, ask: Have you missed taking any pills recently?     pioglitazone  (ACTOS ) 15 MG tablet     Sig: Take 1 tablet (15 mg total) by mouth in the morning. This is for      diabetes.      -is not taking this medication due to concerns with new insulin   8. OTHER SYMPTOMS: Do you have any symptoms? (e.g., fever, frequent urination, difficulty breathing, dizziness, weakness, vomiting)     Feeling nauseous and having frequent urination more than most people go every 30 minutes      Denies fever, difficulty breathing, dizziness, weakness,      vomiting 9. PREGNANCY: Is there any chance you are pregnant? When was your last menstrual period?     N/A male patient   Encounter Protocols Used: Diabetes - High Blood Sugar-A-AH

## 2024-06-18 NOTE — Telephone Encounter (Signed)
 Copied from CRM [id number]. Topic: Nurse Triage - [employer] Contract Page >> Jun 18, 2024  2:05 PM Reena Finder, RN wrote: Charge RN paged on-call provider, Bowen MD, to discuss page and review SBAR.  Provider advised pts provider was out of the office. Dr. Waylan advised they will call the pt now.  Charge RN returned call to patient/caregiver and reviewed provider's instructions as written above. Patient/caregiver verbalized understanding.

## 2024-06-19 NOTE — Telephone Encounter (Signed)
 Called patient and gave him [facility] Customer Service Number [phone], they can help with device malfunction. Patient encouraged to use glucose meter and perform fingersticks to check blood sugar until issue with device is resolved.

## 2024-06-19 NOTE — Telephone Encounter (Signed)
 Copied from CRM #[id number]. Topic: Access To Clinicians - Req Clinic Call Back >> Jun 19, 2024 11:11 AM Donald BROCKS wrote: Clinical Advice: Patient would like a call back asap - states he has a device on him to check his blood sugar. He was told it was to be good for 10 days however it is not giving him a reading and due to his history lately he is very nervous. He is asking for a call back from the nurse to get advice on this.    They have been seen for this issue previously.  The patient preferred contact: Cell Phone Telephone Information: Mobile          [phone]  Urgent callback turnaround time: within 24 business hours. Programmer, systems Notified)

## 2024-06-22 NOTE — Telephone Encounter (Signed)
 sent

## 2024-06-22 NOTE — Telephone Encounter (Signed)
 Copied from CRM #[id number]. Topic: Access To Clinicians - Medication Refill >> Jun 22, 2024 10:48 AM April T wrote:   The patient is requesting the following:   Medication(s): clonazePAM  (KLONOPIN ) 1 MG tablet   oxyCODONE -acetaminophen  (PERCOCET) 10-325 mg per tablet  Quantity for 1 month supply  Pharmacy name and address: [id number] - [place] (N), [place] - [address]      Coverage: yes, coverage is accurate on file.  Urgent turnaround time: within 24 business hours. (Caller Notified)    Urgent Reason: Completely out of medication(s)   Does the caller want to be contacted regarding this request? Yes. Please contact The patient by Cell Phone Telephone Information: Mobile          [phone]

## 2024-06-22 NOTE — Telephone Encounter (Signed)
 Patient contacted and given message that refills were sent, patient did verbalize understanding and had no further questions at this time.

## 2024-07-10 NOTE — Telephone Encounter (Signed)
 Copied from CRM [id number]. Topic: Scheduling - New Appointment >> Jul 10, 2024 12:08 PM Andrew Buchanan wrote: Caller asked to schedule a new appointment. Patient would like to see his PCP next week for a follow up on medication and discuss foot concerns. No availability until November. Patient needs appmt after 3:30pm due to transportation.   Call back [phone]  Additional Requests/Needs: No

## 2024-07-10 NOTE — Telephone Encounter (Signed)
 Patient is requesting the following medication(s) for refill: Klonipine & Percoset, and a quantity of unknown.  Pharmacy name and address has been verified.    [facility] [address] Please refill on 07/20/24. Patient has an appointment on 10/17 with Dr. Missy.

## 2024-07-10 NOTE — Telephone Encounter (Signed)
 Scheduled for 10/17 4:20

## 2024-07-10 NOTE — Telephone Encounter (Signed)
 The message says he needs after 3:30pm due to transportation issues.

## 2024-07-10 NOTE — Telephone Encounter (Signed)
 Patient is requesting the following refill Requested Prescriptions   Pending Prescriptions Disp Refills  . clonazePAM  (KLONOPIN ) 1 MG tablet 60 tablet 0    Sig: Take 1 tablet (1 mg total) by mouth two (2) times a day as needed for anxiety.  . oxyCODONE -acetaminophen  (PERCOCET) 10-325 mg per tablet 120 tablet 0    Sig: Take 1 tablet by mouth every six (6) hours as needed for pain. MAX 4 PILLS PER DAY.    Last refill given on: 06/22/2024  Recent Visits Date Type Provider Dept  05/18/24 Office Visit Missy Mallie Raring, MD [facility] [address] [facility]  03/23/24 Office Visit Missy, Mallie Raring, MD [facility]  02/24/24 Office Visit Missy Mallie Raring, MD [facility]  12/25/23 Office Visit Mardy Moat, FNP [facility]  12/04/23 Office Visit Missy Mallie Raring, MD [facility]  11/19/23 Office Visit Celestia FABIENE Norris, [place] [facility]  09/10/23 Office Visit Leopold Belvie Lie, MD [facility]  07/23/23 Office Visit Leopold Belvie Lie, MD [facility] [place]  Showing recent visits within past 365 days and meeting all other requirements Future Appointments Date Type Provider Dept  07/24/24 Appointment Missy, Mallie Raring, MD [facility] [address] [facility]  Showing future appointments within next 365 days and meeting all other requirements    Opioid Monitoring  Urine Tox Screen Last Drug Screen Date: 11/23/2023 Opiate Confirmation Test Last Drug Screen Date: Not Found Last Opioid Dispensed Provider: Mallie Raring Missy, MD Prescribed MEDD : 60 Last PDMP Review: 04/20/2024  5:54 PM Last Opioid Pain Agreement Signed Date: 03/24/2024 Last Non Opioid Controlled Substance Pain Agreement Signed Date: 12/06/2023  Naloxone Ordered: Not Found Last OV: 03/23/2024

## 2024-07-15 ENCOUNTER — Emergency Department

## 2024-07-15 ENCOUNTER — Observation Stay

## 2024-07-15 ENCOUNTER — Other Ambulatory Visit: Payer: Self-pay

## 2024-07-15 ENCOUNTER — Inpatient Hospital Stay
Admission: EM | Admit: 2024-07-15 | Discharge: 2024-07-20 | DRG: 638 | Disposition: A | Attending: Internal Medicine | Admitting: Internal Medicine

## 2024-07-15 DIAGNOSIS — L97412 Non-pressure chronic ulcer of right heel and midfoot with fat layer exposed: Secondary | ICD-10-CM | POA: Diagnosis present

## 2024-07-15 DIAGNOSIS — E872 Acidosis, unspecified: Secondary | ICD-10-CM

## 2024-07-15 DIAGNOSIS — Z91148 Patient's other noncompliance with medication regimen for other reason: Secondary | ICD-10-CM

## 2024-07-15 DIAGNOSIS — F1010 Alcohol abuse, uncomplicated: Secondary | ICD-10-CM

## 2024-07-15 DIAGNOSIS — Z888 Allergy status to other drugs, medicaments and biological substances status: Secondary | ICD-10-CM

## 2024-07-15 DIAGNOSIS — I10 Essential (primary) hypertension: Secondary | ICD-10-CM

## 2024-07-15 DIAGNOSIS — G894 Chronic pain syndrome: Secondary | ICD-10-CM | POA: Diagnosis present

## 2024-07-15 DIAGNOSIS — F1910 Other psychoactive substance abuse, uncomplicated: Secondary | ICD-10-CM

## 2024-07-15 DIAGNOSIS — F10231 Alcohol dependence with withdrawal delirium: Secondary | ICD-10-CM | POA: Diagnosis present

## 2024-07-15 DIAGNOSIS — E11621 Type 2 diabetes mellitus with foot ulcer: Secondary | ICD-10-CM

## 2024-07-15 DIAGNOSIS — F419 Anxiety disorder, unspecified: Secondary | ICD-10-CM | POA: Diagnosis not present

## 2024-07-15 DIAGNOSIS — E11628 Type 2 diabetes mellitus with other skin complications: Principal | ICD-10-CM | POA: Diagnosis present

## 2024-07-15 DIAGNOSIS — N179 Acute kidney failure, unspecified: Secondary | ICD-10-CM | POA: Diagnosis present

## 2024-07-15 DIAGNOSIS — E1100 Type 2 diabetes mellitus with hyperosmolarity without nonketotic hyperglycemic-hyperosmolar coma (NKHHC): Secondary | ICD-10-CM

## 2024-07-15 DIAGNOSIS — E1142 Type 2 diabetes mellitus with diabetic polyneuropathy: Secondary | ICD-10-CM | POA: Diagnosis present

## 2024-07-15 DIAGNOSIS — L97419 Non-pressure chronic ulcer of right heel and midfoot with unspecified severity: Secondary | ICD-10-CM

## 2024-07-15 DIAGNOSIS — E860 Dehydration: Secondary | ICD-10-CM | POA: Diagnosis present

## 2024-07-15 DIAGNOSIS — Z811 Family history of alcohol abuse and dependence: Secondary | ICD-10-CM

## 2024-07-15 DIAGNOSIS — R739 Hyperglycemia, unspecified: Secondary | ICD-10-CM | POA: Diagnosis not present

## 2024-07-15 DIAGNOSIS — F1320 Sedative, hypnotic or anxiolytic dependence, uncomplicated: Secondary | ICD-10-CM | POA: Diagnosis present

## 2024-07-15 DIAGNOSIS — M8448XA Pathological fracture, other site, initial encounter for fracture: Secondary | ICD-10-CM | POA: Diagnosis present

## 2024-07-15 DIAGNOSIS — Z8614 Personal history of Methicillin resistant Staphylococcus aureus infection: Secondary | ICD-10-CM

## 2024-07-15 DIAGNOSIS — F10121 Alcohol abuse with intoxication delirium: Secondary | ICD-10-CM | POA: Diagnosis not present

## 2024-07-15 DIAGNOSIS — F10129 Alcohol abuse with intoxication, unspecified: Secondary | ICD-10-CM

## 2024-07-15 DIAGNOSIS — E871 Hypo-osmolality and hyponatremia: Secondary | ICD-10-CM | POA: Diagnosis present

## 2024-07-15 DIAGNOSIS — L97509 Non-pressure chronic ulcer of other part of unspecified foot with unspecified severity: Secondary | ICD-10-CM

## 2024-07-15 DIAGNOSIS — M25561 Pain in right knee: Secondary | ICD-10-CM | POA: Diagnosis present

## 2024-07-15 DIAGNOSIS — F319 Bipolar disorder, unspecified: Secondary | ICD-10-CM | POA: Diagnosis present

## 2024-07-15 DIAGNOSIS — B9562 Methicillin resistant Staphylococcus aureus infection as the cause of diseases classified elsewhere: Secondary | ICD-10-CM | POA: Diagnosis present

## 2024-07-15 DIAGNOSIS — Z79899 Other long term (current) drug therapy: Secondary | ICD-10-CM

## 2024-07-15 DIAGNOSIS — F17290 Nicotine dependence, other tobacco product, uncomplicated: Secondary | ICD-10-CM | POA: Diagnosis present

## 2024-07-15 DIAGNOSIS — E875 Hyperkalemia: Secondary | ICD-10-CM | POA: Diagnosis present

## 2024-07-15 DIAGNOSIS — I7389 Other specified peripheral vascular diseases: Secondary | ICD-10-CM

## 2024-07-15 DIAGNOSIS — S0031XA Abrasion of nose, initial encounter: Principal | ICD-10-CM | POA: Diagnosis present

## 2024-07-15 DIAGNOSIS — Y908 Blood alcohol level of 240 mg/100 ml or more: Secondary | ICD-10-CM | POA: Diagnosis present

## 2024-07-15 DIAGNOSIS — R7881 Bacteremia: Secondary | ICD-10-CM | POA: Diagnosis present

## 2024-07-15 DIAGNOSIS — D649 Anemia, unspecified: Secondary | ICD-10-CM | POA: Diagnosis present

## 2024-07-15 DIAGNOSIS — Z7984 Long term (current) use of oral hypoglycemic drugs: Secondary | ICD-10-CM

## 2024-07-15 DIAGNOSIS — F314 Bipolar disorder, current episode depressed, severe, without psychotic features: Secondary | ICD-10-CM

## 2024-07-15 DIAGNOSIS — Z794 Long term (current) use of insulin: Secondary | ICD-10-CM

## 2024-07-15 DIAGNOSIS — W19XXXA Unspecified fall, initial encounter: Secondary | ICD-10-CM | POA: Diagnosis present

## 2024-07-15 DIAGNOSIS — E1165 Type 2 diabetes mellitus with hyperglycemia: Secondary | ICD-10-CM | POA: Diagnosis present

## 2024-07-15 DIAGNOSIS — F10229 Alcohol dependence with intoxication, unspecified: Secondary | ICD-10-CM | POA: Diagnosis present

## 2024-07-15 DIAGNOSIS — F1090 Alcohol use, unspecified, uncomplicated: Secondary | ICD-10-CM | POA: Diagnosis present

## 2024-07-15 DIAGNOSIS — F1110 Opioid abuse, uncomplicated: Secondary | ICD-10-CM | POA: Diagnosis present

## 2024-07-15 DIAGNOSIS — E1110 Type 2 diabetes mellitus with ketoacidosis without coma: Secondary | ICD-10-CM | POA: Diagnosis present

## 2024-07-15 HISTORY — DX: Sepsis, unspecified organism: A41.9

## 2024-07-15 LAB — COMPREHENSIVE METABOLIC PANEL WITH GFR
ALT: 57 U/L — ABNORMAL HIGH (ref 0–44)
AST: 355 U/L — ABNORMAL HIGH (ref 15–41)
Albumin: 3.2 g/dL — ABNORMAL LOW (ref 3.5–5.0)
Alkaline Phosphatase: 100 U/L (ref 38–126)
Anion gap: 14 (ref 5–15)
BUN: 14 mg/dL (ref 6–20)
CO2: 22 mmol/L (ref 22–32)
Calcium: 8.3 mg/dL — ABNORMAL LOW (ref 8.9–10.3)
Chloride: 99 mmol/L (ref 98–111)
Creatinine, Ser: 1.26 mg/dL — ABNORMAL HIGH (ref 0.61–1.24)
GFR, Estimated: >60 mL/min (ref >60)
Glucose, Bld: 550 mg/dL (ref 70–99)
Potassium: 4.5 mmol/L (ref 3.5–5.1)
Sodium: 135 mmol/L (ref 135–145)
Total Bilirubin: 0.6 mg/dL (ref 0.0–1.2)
Total Protein: 7.1 g/dL (ref 6.5–8.1)

## 2024-07-15 LAB — URINALYSIS, ROUTINE W REFLEX MICROSCOPIC
Bacteria, UA: NONE SEEN
Bilirubin Urine: NEGATIVE
Glucose, UA: >=500 mg/dL — AB
Hgb urine dipstick: NEGATIVE
Ketones, ur: NEGATIVE mg/dL
Leukocytes,Ua: NEGATIVE
Nitrite: NEGATIVE
Protein, ur: NEGATIVE mg/dL
RBC / HPF: 0 RBC/hpf (ref 0–5)
Specific Gravity, Urine: 1.018 (ref 1.005–1.030)
pH: 6 (ref 5.0–8.0)

## 2024-07-15 LAB — BLOOD GAS, VENOUS
Acid-base deficit: 0.8 mmol/L (ref 0.0–2.0)
Bicarbonate: 26.2 mmol/L (ref 20.0–28.0)
O2 Saturation: 55.3 %
Patient temperature: 37
pCO2, Ven: 52 mmHg (ref 44–60)
pH, Ven: 7.31 (ref 7.25–7.43)
pO2, Ven: 35 mmHg (ref 32–45)

## 2024-07-15 LAB — CBC
HCT: 34.4 % — ABNORMAL LOW (ref 39.0–52.0)
Hemoglobin: 11.5 g/dL — ABNORMAL LOW (ref 13.0–17.0)
MCH: 34.5 pg — ABNORMAL HIGH (ref 26.0–34.0)
MCHC: 33.4 g/dL (ref 30.0–36.0)
MCV: 103.3 fL — ABNORMAL HIGH (ref 80.0–100.0)
Platelets: 364 K/uL (ref 150–400)
RBC: 3.33 MIL/uL — ABNORMAL LOW (ref 4.22–5.81)
RDW: 12.6 % (ref 11.5–15.5)
WBC: 7.2 K/uL (ref 4.0–10.5)
nRBC: 0 % (ref 0.0–0.2)

## 2024-07-15 LAB — BASIC METABOLIC PANEL WITH GFR
Anion gap: 11 (ref 5–15)
Anion gap: 12 (ref 5–15)
BUN: 13 mg/dL (ref 6–20)
BUN: 12 mg/dL (ref 6–20)
CO2: 22 mmol/L (ref 22–32)
CO2: 21 mmol/L — ABNORMAL LOW (ref 22–32)
Calcium: 7.2 mg/dL — ABNORMAL LOW (ref 8.9–10.3)
Calcium: 7.9 mg/dL — ABNORMAL LOW (ref 8.9–10.3)
Chloride: 105 mmol/L (ref 98–111)
Chloride: 106 mmol/L (ref 98–111)
Creatinine, Ser: 0.95 mg/dL (ref 0.61–1.24)
Creatinine, Ser: 0.9 mg/dL (ref 0.61–1.24)
GFR, Estimated: >60 mL/min (ref >60)
GFR, Estimated: >60 mL/min (ref >60)
Glucose, Bld: 488 mg/dL — ABNORMAL HIGH (ref 70–99)
Glucose, Bld: 261 mg/dL — ABNORMAL HIGH (ref 70–99)
Potassium: 4.4 mmol/L (ref 3.5–5.1)
Potassium: 4.6 mmol/L (ref 3.5–5.1)
Sodium: 138 mmol/L (ref 135–145)
Sodium: 139 mmol/L (ref 135–145)

## 2024-07-15 LAB — GLUCOSE, CAPILLARY
Glucose-Capillary: 110 mg/dL — ABNORMAL HIGH (ref 70–99)
Glucose-Capillary: 133 mg/dL — ABNORMAL HIGH (ref 70–99)
Glucose-Capillary: 147 mg/dL — ABNORMAL HIGH (ref 70–99)
Glucose-Capillary: 161 mg/dL — ABNORMAL HIGH (ref 70–99)
Glucose-Capillary: 180 mg/dL — ABNORMAL HIGH (ref 70–99)
Glucose-Capillary: 208 mg/dL — ABNORMAL HIGH (ref 70–99)
Glucose-Capillary: 221 mg/dL — ABNORMAL HIGH (ref 70–99)
Glucose-Capillary: 251 mg/dL — ABNORMAL HIGH (ref 70–99)

## 2024-07-15 LAB — URINE DRUG SCREEN, QUALITATIVE (ARMC ONLY)
Amphetamines, Ur Screen: NOT DETECTED
Barbiturates, Ur Screen: NOT DETECTED
Benzodiazepine, Ur Scrn: POSITIVE — AB
Cannabinoid 50 Ng, Ur ~~LOC~~: NOT DETECTED
Cocaine Metabolite,Ur ~~LOC~~: NOT DETECTED
MDMA (Ecstasy)Ur Screen: NOT DETECTED
Methadone Scn, Ur: NOT DETECTED
Opiate, Ur Screen: POSITIVE — AB
Phencyclidine (PCP) Ur S: NOT DETECTED
Tricyclic, Ur Screen: NOT DETECTED

## 2024-07-15 LAB — LACTIC ACID, PLASMA
Lactic Acid, Venous: 6.7 mmol/L (ref 0.5–1.9)
Lactic Acid, Venous: 5.8 mmol/L (ref 0.5–1.9)
Lactic Acid, Venous: 7.3 mmol/L (ref 0.5–1.9)

## 2024-07-15 LAB — PROCALCITONIN: Procalcitonin: 0.11 ng/mL

## 2024-07-15 LAB — ETHANOL: Alcohol, Ethyl (B): 326 mg/dL (ref <15)

## 2024-07-15 LAB — OSMOLALITY: Osmolality: 375 mosm/kg (ref 275–295)

## 2024-07-15 LAB — CBG MONITORING, ED
Glucose-Capillary: 491 mg/dL — ABNORMAL HIGH (ref 70–99)
Glucose-Capillary: 457 mg/dL — ABNORMAL HIGH (ref 70–99)
Glucose-Capillary: 435 mg/dL — ABNORMAL HIGH (ref 70–99)
Glucose-Capillary: 447 mg/dL — ABNORMAL HIGH (ref 70–99)
Glucose-Capillary: 341 mg/dL — ABNORMAL HIGH (ref 70–99)
Glucose-Capillary: 408 mg/dL — ABNORMAL HIGH (ref 70–99)

## 2024-07-15 LAB — BETA-HYDROXYBUTYRIC ACID: Beta-Hydroxybutyric Acid: 0.13 mmol/L (ref 0.05–0.27)

## 2024-07-15 LAB — LIPASE, BLOOD: Lipase: <10 U/L — ABNORMAL LOW (ref 11–51)

## 2024-07-15 LAB — PROTIME-INR
INR: 1.2 (ref 0.8–1.2)
Prothrombin Time: 16.2 s — ABNORMAL HIGH (ref 11.4–15.2)

## 2024-07-15 LAB — MRSA NEXT GEN BY PCR, NASAL: MRSA by PCR Next Gen: DETECTED — AB

## 2024-07-15 LAB — AMMONIA: Ammonia: 52 umol/L — ABNORMAL HIGH (ref 9–35)

## 2024-07-15 MED ORDER — DEXTROSE IN LACTATED RINGERS 5 % IV SOLN: INTRAVENOUS | Status: DC

## 2024-07-15 MED ORDER — SODIUM CHLORIDE 0.9 % IV BOLUS
500.0000 mL | Freq: Once | INTRAVENOUS | Status: AC
  Administered 2024-07-15: 500 mL via INTRAVENOUS

## 2024-07-15 MED ORDER — LORAZEPAM 2 MG/ML IJ SOLN: 1.0000 mg | INTRAMUSCULAR | Status: DC | PRN

## 2024-07-15 MED ORDER — CHLORDIAZEPOXIDE HCL 25 MG PO CAPS
25.0000 mg | ORAL_CAPSULE | Freq: Once | ORAL | Status: AC
  Administered 2024-07-15: 25 mg via ORAL
  Filled 2024-07-15: qty 1

## 2024-07-15 MED ORDER — ENOXAPARIN SODIUM 40 MG/0.4ML IJ SOSY
40.0000 mg | PREFILLED_SYRINGE | INTRAMUSCULAR | Status: DC
  Administered 2024-07-15 – 2024-07-19 (×5): 40 mg via SUBCUTANEOUS
  Filled 2024-07-15 (×5): qty 0.4

## 2024-07-15 MED ORDER — CHLORHEXIDINE GLUCONATE CLOTH 2 % EX PADS
6.0000 | MEDICATED_PAD | Freq: Every day | CUTANEOUS | Status: DC
  Administered 2024-07-15 – 2024-07-17 (×3): 6 via TOPICAL

## 2024-07-15 MED ORDER — LORAZEPAM 2 MG/ML IJ SOLN
1.0000 mg | Freq: Once | INTRAMUSCULAR | Status: AC
  Administered 2024-07-15: 1 mg via INTRAVENOUS
  Filled 2024-07-15: qty 1

## 2024-07-15 MED ORDER — LOPERAMIDE HCL 2 MG PO CAPS
2.0000 mg | ORAL_CAPSULE | ORAL | Status: DC | PRN
  Administered 2024-07-17: 4 mg via ORAL
  Filled 2024-07-15: qty 2

## 2024-07-15 MED ORDER — VANCOMYCIN HCL IN DEXTROSE 1-5 GM/200ML-% IV SOLN
1000.0000 mg | Freq: Once | INTRAVENOUS | Status: AC
  Administered 2024-07-15: 1000 mg via INTRAVENOUS
  Filled 2024-07-15: qty 200

## 2024-07-15 MED ORDER — CHLORDIAZEPOXIDE HCL 25 MG PO CAPS
25.0000 mg | ORAL_CAPSULE | Freq: Every day | ORAL | Status: AC
  Administered 2024-07-18: 25 mg via ORAL
  Filled 2024-07-15: qty 1

## 2024-07-15 MED ORDER — LORAZEPAM 2 MG/ML IJ SOLN
1.0000 mg | Freq: Once | INTRAMUSCULAR | Status: AC
Start: 2024-07-15 — End: 2024-07-15
  Administered 2024-07-15: 1 mg via INTRAVENOUS
  Filled 2024-07-15: qty 1

## 2024-07-15 MED ORDER — THIAMINE HCL 100 MG/ML IJ SOLN
100.0000 mg | Freq: Every day | INTRAMUSCULAR | Status: DC
Start: 2024-07-15 — End: 2024-07-20
  Administered 2024-07-15: 100 mg via INTRAVENOUS
  Filled 2024-07-15: qty 2

## 2024-07-15 MED ORDER — POTASSIUM CHLORIDE 10 MEQ/100ML IV SOLN
10.0000 meq | INTRAVENOUS | Status: AC
  Filled 2024-07-15: qty 100

## 2024-07-15 MED ORDER — ENALAPRIL MALEATE 10 MG PO TABS
20.0000 mg | ORAL_TABLET | Freq: Two times a day (BID) | ORAL | Status: DC
  Administered 2024-07-16 – 2024-07-20 (×9): 20 mg via ORAL
  Filled 2024-07-15: qty 2
  Filled 2024-07-15 (×2): qty 1
  Filled 2024-07-15 (×9): qty 2

## 2024-07-15 MED ORDER — HYDROXYZINE HCL 50 MG PO TABS
25.0000 mg | ORAL_TABLET | Freq: Four times a day (QID) | ORAL | Status: DC | PRN
Start: 2024-07-15 — End: 2024-07-18
  Administered 2024-07-16: 25 mg via ORAL
  Filled 2024-07-15 (×2): qty 1

## 2024-07-15 MED ORDER — CHLORDIAZEPOXIDE HCL 25 MG PO CAPS
25.0000 mg | ORAL_CAPSULE | Freq: Three times a day (TID) | ORAL | Status: AC
  Administered 2024-07-16 – 2024-07-17 (×3): 25 mg via ORAL
  Filled 2024-07-15 (×3): qty 1

## 2024-07-15 MED ORDER — CHLORDIAZEPOXIDE HCL 25 MG PO CAPS
25.0000 mg | ORAL_CAPSULE | ORAL | Status: AC
  Administered 2024-07-17 – 2024-07-18 (×2): 25 mg via ORAL
  Filled 2024-07-15 (×2): qty 1

## 2024-07-15 MED ORDER — VANCOMYCIN HCL 500 MG/100ML IV SOLN
500.0000 mg | Freq: Once | INTRAVENOUS | Status: AC
  Administered 2024-07-15: 500 mg via INTRAVENOUS
  Filled 2024-07-15: qty 100

## 2024-07-15 MED ORDER — LORAZEPAM 2 MG/ML IJ SOLN
1.0000 mg | INTRAMUSCULAR | Status: AC | PRN
  Administered 2024-07-15: 3 mg via INTRAVENOUS
  Administered 2024-07-15 – 2024-07-16 (×2): 4 mg via INTRAVENOUS
  Administered 2024-07-16: 2 mg via INTRAVENOUS
  Administered 2024-07-16 (×2): 4 mg via INTRAVENOUS
  Administered 2024-07-16 – 2024-07-17 (×5): 2 mg via INTRAVENOUS
  Administered 2024-07-17: 4 mg via INTRAVENOUS
  Administered 2024-07-17: 1 mg via INTRAVENOUS
  Administered 2024-07-18: 2 mg via INTRAVENOUS
  Administered 2024-07-18: 4 mg via INTRAVENOUS
  Administered 2024-07-18: 2 mg via INTRAVENOUS
  Filled 2024-07-15: qty 1
  Filled 2024-07-15: qty 2
  Filled 2024-07-15 (×3): qty 1
  Filled 2024-07-15: qty 2
  Filled 2024-07-15 (×2): qty 1
  Filled 2024-07-15: qty 2
  Filled 2024-07-15: qty 1
  Filled 2024-07-15: qty 2
  Filled 2024-07-15: qty 1
  Filled 2024-07-15: qty 2
  Filled 2024-07-15 (×3): qty 1
  Filled 2024-07-15: qty 2
  Filled 2024-07-15: qty 1

## 2024-07-15 MED ORDER — INSULIN REGULAR(HUMAN) IN NACL 100-0.9 UT/100ML-% IV SOLN
INTRAVENOUS | Status: DC
  Administered 2024-07-15: 13 [IU]/h via INTRAVENOUS
  Filled 2024-07-15: qty 100

## 2024-07-15 MED ORDER — CLONAZEPAM 0.5 MG PO TABS
1.0000 mg | ORAL_TABLET | Freq: Two times a day (BID) | ORAL | Status: DC | PRN
  Administered 2024-07-15 – 2024-07-18 (×6): 1 mg via ORAL
  Filled 2024-07-15 (×4): qty 1
  Filled 2024-07-15 (×2): qty 2

## 2024-07-15 MED ORDER — ADULT MULTIVITAMIN W/MINERALS CH
1.0000 | ORAL_TABLET | Freq: Every day | ORAL | Status: DC
  Administered 2024-07-15 – 2024-07-20 (×6): 1 via ORAL
  Filled 2024-07-15 (×6): qty 1

## 2024-07-15 MED ORDER — MUPIROCIN 2 % EX OINT
1.0000 | TOPICAL_OINTMENT | Freq: Two times a day (BID) | CUTANEOUS | Status: AC
  Administered 2024-07-15 – 2024-07-20 (×10): 1 via NASAL
  Filled 2024-07-15 (×2): qty 22

## 2024-07-15 MED ORDER — ONDANSETRON 4 MG PO TBDP
4.0000 mg | ORAL_TABLET | Freq: Four times a day (QID) | ORAL | Status: DC | PRN
  Administered 2024-07-15 – 2024-07-17 (×5): 4 mg via ORAL
  Filled 2024-07-15 (×6): qty 1

## 2024-07-15 MED ORDER — OXYCODONE-ACETAMINOPHEN 5-325 MG PO TABS
2.0000 | ORAL_TABLET | ORAL | Status: AC
  Administered 2024-07-15: 2 via ORAL
  Filled 2024-07-15: qty 2

## 2024-07-15 MED ORDER — DEXTROSE 50 % IV SOLN: 0.0000 mL | INTRAVENOUS | Status: DC | PRN

## 2024-07-15 MED ORDER — LORAZEPAM 2 MG/ML IJ SOLN
2.0000 mg | Freq: Once | INTRAMUSCULAR | Status: AC
  Administered 2024-07-15: 2 mg via INTRAVENOUS
  Filled 2024-07-15: qty 1

## 2024-07-15 MED ORDER — LORAZEPAM 1 MG PO TABS: 1.0000 mg | ORAL_TABLET | ORAL | Status: DC | PRN

## 2024-07-15 MED ORDER — SODIUM CHLORIDE 0.9 % IV SOLN
2.0000 g | Freq: Once | INTRAVENOUS | Status: AC
  Administered 2024-07-15: 2 g via INTRAVENOUS
  Filled 2024-07-15: qty 12.5

## 2024-07-15 MED ORDER — ADULT MULTIVITAMIN W/MINERALS CH: 1.0000 | ORAL_TABLET | Freq: Every day | ORAL | Status: DC

## 2024-07-15 MED ORDER — VANCOMYCIN HCL IN DEXTROSE 1-5 GM/200ML-% IV SOLN
1000.0000 mg | Freq: Two times a day (BID) | INTRAVENOUS | Status: DC
  Administered 2024-07-16 (×2): 1000 mg via INTRAVENOUS
  Filled 2024-07-15 (×3): qty 200

## 2024-07-15 MED ORDER — FOLIC ACID 1 MG PO TABS: 1.0000 mg | ORAL_TABLET | Freq: Every day | ORAL | Status: DC

## 2024-07-15 MED ORDER — LACTATED RINGERS IV SOLN: INTRAVENOUS | Status: DC

## 2024-07-15 MED ORDER — THIAMINE MONONITRATE 100 MG PO TABS
100.0000 mg | ORAL_TABLET | Freq: Every day | ORAL | Status: DC
Start: 2024-07-15 — End: 2024-07-20
  Administered 2024-07-16 – 2024-07-20 (×5): 100 mg via ORAL
  Filled 2024-07-15 (×5): qty 1

## 2024-07-15 MED ORDER — LORAZEPAM 1 MG PO TABS
1.0000 mg | ORAL_TABLET | ORAL | Status: AC | PRN
  Administered 2024-07-17: 1 mg via ORAL
  Administered 2024-07-17: 2 mg via ORAL
  Administered 2024-07-17 – 2024-07-18 (×3): 1 mg via ORAL
  Administered 2024-07-18: 2 mg via ORAL
  Administered 2024-07-18 (×2): 1 mg via ORAL
  Filled 2024-07-15: qty 2
  Filled 2024-07-15 (×3): qty 1
  Filled 2024-07-15: qty 2
  Filled 2024-07-15 (×3): qty 1

## 2024-07-15 MED ORDER — OXYCODONE-ACETAMINOPHEN 5-325 MG PO TABS
1.0000 | ORAL_TABLET | Freq: Four times a day (QID) | ORAL | Status: DC | PRN
Start: 2024-07-15 — End: 2024-07-16
  Administered 2024-07-15 – 2024-07-16 (×3): 2 via ORAL
  Filled 2024-07-15 (×3): qty 2

## 2024-07-15 MED ORDER — ORAL CARE MOUTH RINSE: 15.0000 mL | OROMUCOSAL | Status: DC | PRN

## 2024-07-15 MED ORDER — METRONIDAZOLE 500 MG/100ML IV SOLN
500.0000 mg | Freq: Two times a day (BID) | INTRAVENOUS | Status: DC
  Administered 2024-07-15: 500 mg via INTRAVENOUS
  Filled 2024-07-15 (×2): qty 100

## 2024-07-15 MED ORDER — FOLIC ACID 1 MG PO TABS
1.0000 mg | ORAL_TABLET | Freq: Every day | ORAL | Status: DC
  Administered 2024-07-15 – 2024-07-20 (×6): 1 mg via ORAL
  Filled 2024-07-15 (×6): qty 1

## 2024-07-15 MED ORDER — SODIUM CHLORIDE 0.9 % IV BOLUS
1000.0000 mL | Freq: Once | INTRAVENOUS | Status: AC
  Administered 2024-07-15: 1000 mL via INTRAVENOUS

## 2024-07-15 MED ORDER — METRONIDAZOLE 500 MG/100ML IV SOLN
500.0000 mg | Freq: Once | INTRAVENOUS | Status: AC
  Administered 2024-07-15: 500 mg via INTRAVENOUS
  Filled 2024-07-15: qty 100

## 2024-07-15 MED ORDER — INSULIN ASPART PROT & ASPART (70-30 MIX) 100 UNIT/ML ~~LOC~~ SUSP
15.0000 [IU] | Freq: Once | SUBCUTANEOUS | Status: DC
  Filled 2024-07-15: qty 10

## 2024-07-15 MED ORDER — THIAMINE HCL 100 MG/ML IJ SOLN: 100.0000 mg | Freq: Every day | INTRAMUSCULAR | Status: DC

## 2024-07-15 MED ORDER — THIAMINE MONONITRATE 100 MG PO TABS: 100.0000 mg | ORAL_TABLET | Freq: Every day | ORAL | Status: DC

## 2024-07-15 MED ORDER — SODIUM CHLORIDE 0.9 % IV SOLN
2.0000 g | Freq: Three times a day (TID) | INTRAVENOUS | Status: DC
  Administered 2024-07-15 – 2024-07-16 (×3): 2 g via INTRAVENOUS
  Filled 2024-07-15 (×3): qty 12.5

## 2024-07-15 MED ORDER — CHLORDIAZEPOXIDE HCL 25 MG PO CAPS
25.0000 mg | ORAL_CAPSULE | Freq: Four times a day (QID) | ORAL | Status: AC
  Administered 2024-07-15 – 2024-07-16 (×4): 25 mg via ORAL
  Filled 2024-07-15 (×3): qty 1

## 2024-07-15 NOTE — ED Notes (Addendum)
 Pt reports he has had 2 bahama mamas this AM. Reports CBG over 900 last night., has been taking insulin  at home. Pt poor historian. Pt has dried blood to nose, states that he hit it on wall and broke it yesterday. Pt is yelling at staff. Noted wounds to bilateral feet with foul odor.

## 2024-07-15 NOTE — Discharge Instructions (Addendum)
 2pm tomorrow for infusion of antibiotics.  2nd floor at infusion center!    Outpatient Substance Use Treatment Services   [facility] [address] [place], [place] [address]  [phone] Private insurance, Medicare A&B, and [facility]   [facility]  [address],  [place], [place] [address] [phone] Medicaid, Self Pay   [facility]      [address]  [place], [place] [phone] Medicaid and [facility], Self Pay   [facility] [address]  [place], [place]  [phone] [facility], and Self Pay  [facility]      [address]    [place], [place] [address]  [phone] or [phone] Private Insurance Only                 [facility] [address]  [place], [place]  [phone] Medicaid, Medicare, Private Insurance  [facility] [address]  [place], [place] [address] [phone] Services are free or reduced  [facility]  [address] [phone]  Self Pay only, sliding scale  [facility]  [address]  [place], [place] [address] [phone] ([facility]) Self Pay, Medicaid Only   [facility] [address], [place] [address] [phone] Medicaid, Medicare, Private Insurance

## 2024-07-15 NOTE — H&P (Addendum)
 History and Physical   Andrew Buchanan FMW:[id number] DOB: 1971/05/24 DOA: 07/15/2024  PCP: System, Provider Not In   Patient coming from: Home  Chief Complaint: Hyperglycemia, foot pain  HPI: Andrew Buchanan is a 53 y.o. male with medical history significant of hypertension, diabetes, HHS, foot ulcers, substance use, alcohol use, medication nonadherence, anxiety, bipolar disorder presenting with hyperglycemia and foot pain.  Patient reports 1 to 2 days of soreness at his right foot.  Has known ulcers on his feet with history of diabetes.  States he had some issues with his balance due to some of his foot pain as well as some dizziness and he had a fall where he hit his face yesterday.  Did not lose consciousness.  Today he notes his blood sugar was reading greater than 900 .  Reports he did try taking several doses of insulin  prior to coming to the ED.  Denies fevers, chills, chest pain, shortness of breath, abdominal pain, constipation, diarrhea, nausea, vomiting. Initially denied recent alcohol use in the ED but lab work showed ethanol level of 326 and patient reports drinking alcohol to me but denies any other illicit/nonprescribed substance use.  ED Course: Vital signs in the ED notable for blood pressure in the 130s-150 systolic.  Lab workup included CMP with potassium 4.5, creatinine stable at 1.36, glucose 550, calcium  8.3, albumin 3.2, AST 355, ALT 57.  CBC with hemoglobin stable 11.5.  PT 16.2, INR normal.  Lactic acid 6.7, repeat pending.  Lipase pending.  Procalcitonin pending.  Ammonia level mildly elevated at 52.  Ethanol level 326.  UDS positive for opiates and benzos.  Urinalysis with glucose only.  Blood cultures pending.  VBG with normal pH and normal pCO2.  Beta hydroxybutyric acid normal.  Patient declined x-ray of his foot.  CT head and CT C-spine showed no acute abnormality but did demonstrate stable chronic C6 fractures.  Patient received oxycodone , Ativan  x  4, vancomycin , cefepime , Flagyl  in the ED as well as 1.5 L of IV fluids.  Patient initially significantly agitated and had to have male nurse due to abusive language and behaviors.  ICU was consulted for possible Precedex  however patient calmed down by the time he was evaluated and was deemed stable to be admitted to hospitalist.  Will continue close monitoring in stepdown for now.  Evaluated by psychiatry who agreed patient did not have capacity.  Patient has been IVC'd and will be reevaluated by psychiatry as intoxication improves.  Review of Systems: As per HPI otherwise all other systems reviewed and are negative.  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  He has never used smokeless tobacco. He reports current alcohol use. He reports current drug use. Drugs: Crack cocaine, Benzodiazepines, and Cocaine.  Allergies  Allergen Reactions   Amlodipine Shortness Of Breath   Antihistamines, Chlorpheniramine-Type Anaphylaxis   Statins Other (See Comments)    Pt declines    Family History  Problem Relation Age of Onset   Alcoholism Father   Reviewed on admission  Prior to Admission  medications   Medication Sig Start Date End Date Taking? Authorizing Provider  albuterol  (VENTOLIN  HFA) 108 (90 Base) MCG/ACT inhaler Inhale 1-2 puffs into the lungs every 4 (four) hours as needed. 06/08/21   [provider]  blood glucose meter kit and supplies KIT Dispense based on patient and insurance preference. Use up to four times daily as directed. 01/16/21   Rojelio Nest, DO  clonazePAM  (KLONOPIN ) 1 MG tablet Take 1 tablet (1 mg total) by mouth 2 (two) times daily as needed for up to 7 days. 06/16/24 06/23/24  Jhonny Calvin NOVAK, MD  Continuous Glucose Sensor (DEXCOM G7 SENSOR) MISC Apply to skin as directed. Change every 10 days. 06/16/24   Jhonny Calvin NOVAK, MD  enalapril  (VASOTEC ) 20 MG tablet Take 20 mg by mouth 2 (two) times daily. 06/03/24   [provider]  gabapentin  (NEURONTIN ) 300 MG capsule Take 2 capsules (600 mg total) by mouth 2 (two) times daily. 05/20/20   Patel, Sona, MD  insulin  NPH-regular Human (HUMULIN  70/30) (70-30) 100 UNIT/ML injection Inject 18 Units into the skin 2 (two) times daily with a meal. 06/16/24   Sreenath, Calvin NOVAK, MD  Insulin  Pen Needle 31G X 5 MM MISC 1 each by Does not apply route 4 (four) times daily. 01/16/21   Rojelio Nest, DO  pioglitazone  (ACTOS ) 15 MG tablet Take 15 mg by mouth daily. 05/18/24 05/18/25  [provider]    Physical Exam: Vitals:   07/15/24 0905 07/15/24 0906 07/15/24 1100  BP: (!) 145/94  (!) 137/91  Pulse: 93  81  Resp: 16  17  Temp:  98.1 F (36.7 C)   TempSrc:  Oral   SpO2: 100%  99%  Weight:  74.4 kg   Height:  6' (1.829 m)     Physical Exam Constitutional:      General: He is not in acute distress.    Appearance: Normal appearance.  HENT:     Head: Normocephalic and atraumatic.     Mouth/Throat:     Mouth: Mucous membranes are moist.     Pharynx: Oropharynx is clear.  Eyes:     Extraocular Movements: Extraocular movements intact.     Pupils: Pupils are equal, round, and reactive to  light.  Cardiovascular:     Rate and Rhythm: Normal rate and regular rhythm.     Pulses: Normal pulses.     Heart sounds: Normal heart sounds.  Pulmonary:     Effort: Pulmonary effort is normal. No respiratory distress.     Breath sounds: Normal breath sounds.  Abdominal:     General: Bowel sounds are normal. There is no distension.     Palpations: Abdomen is soft.     Tenderness: There is no abdominal tenderness.  Musculoskeletal:        General: No swelling or deformity.     Comments: Foot wound.  Significant plantar surface swelling about the wound some erythema.  Tenderness also noted.   Skin:    General: Skin is warm and dry.  Neurological:     General:  No focal deficit present.     Mental Status: Mental status is at baseline.      Labs on Admission: I have personally reviewed following labs and imaging studies  CBC: Recent Labs  Lab 07/15/24 0913  WBC 7.2  HGB 11.5*  HCT 34.4*  MCV 103.3*  PLT 364    Basic Metabolic Panel: Recent Labs  Lab 07/15/24 0922  NA 135  K 4.5  CL 99  CO2 22  GLUCOSE 550*  BUN 14  CREATININE 1.26*  CALCIUM  8.3*    GFR: Estimated Creatinine Clearance: 71.3 mL/min (A) (by C-G formula based on SCr of 1.26 mg/dL (H)).  Liver Function Tests: Recent Labs  Lab 07/15/24 0922  AST 355*  ALT 57*  ALKPHOS 100  BILITOT 0.6  PROT 7.1  ALBUMIN 3.2*    Urine analysis:    Component Value Date/Time   COLORURINE COLORLESS (A) 07/15/2024 0946   APPEARANCEUR CLEAR (A) 07/15/2024 0946   APPEARANCEUR Clear 08/18/2014 1318   LABSPEC 1.018 07/15/2024 0946   LABSPEC 1.024 08/18/2014 1318   PHURINE 6.0 07/15/2024 0946   GLUCOSEU >=500 (A) 07/15/2024 0946   GLUCOSEU Negative 08/18/2014 1318   HGBUR NEGATIVE 07/15/2024 0946   BILIRUBINUR NEGATIVE 07/15/2024 0946   BILIRUBINUR Negative 08/18/2014 1318   KETONESUR NEGATIVE 07/15/2024 0946   PROTEINUR NEGATIVE 07/15/2024 0946   NITRITE NEGATIVE 07/15/2024 0946   LEUKOCYTESUR NEGATIVE  07/15/2024 0946   LEUKOCYTESUR Negative 08/18/2014 1318    Radiological Exams on Admission: CT Cervical Spine Wo Contrast Result Date: 07/15/2024 EXAM: CT CERVICAL SPINE WITHOUT CONTRAST 07/15/2024 09:59:13 AM TECHNIQUE: CT of the cervical spine was performed without the administration of intravenous contrast. Multiplanar reformatted images are provided for review. Automated exposure control, iterative reconstruction, and/or weight based adjustment of the mA/kV was utilized to reduce the radiation dose to as low as reasonably achievable. COMPARISON: Cervical spine CT 01/05/2021. CLINICAL HISTORY: 53 year old male with blunt facial trauma and reported nasal fracture from yesterday. FINDINGS: CERVICAL SPINE: BONES AND ALIGNMENT: Stable and normal cervical lordosis. Normal background mineralization. Chronic and unhealed bilateral C6 lamina/spinous process fracture as seen on series 6 image 73, unchanged from prior. Superimposed probable healed posterior lamina and spinous process fracture at C5, stable. No acute osseous abnormality identified. DEGENERATIVE CHANGES: Cervical spine degeneration with no significant spinal stenosis by CT. SOFT TISSUES: No prevertebral soft tissue swelling. IMPRESSION: 1. No acute traumatic injury in the cervical spine. 2. Stable Chronic and unhealed C6 lamina/spinous process fracture. Electronically signed by: Helayne Hurst MD 07/15/2024 10:21 AM EDT RP Workstation: [id number]   CT Head Wo Contrast Result Date: 07/15/2024 EXAM: CT HEAD WITHOUT CONTRAST 07/15/2024 09:59:13 AM TECHNIQUE: CT of the head was performed without the administration of intravenous contrast. Automated exposure control, iterative reconstruction, and/or weight based adjustment of the mA/kV was utilized to reduce the radiation dose to as low as reasonably achievable. COMPARISON: Head CT 08/25/2021. CLINICAL HISTORY: 53 year old male with blunt facial trauma, dried blood to nose, and reported nasal fracture from  yesterday. FINDINGS: BRAIN AND VENTRICLES: No acute hemorrhage. No evidence of acute infarct. No hydrocephalus. No extra-axial collection. No mass effect or midline shift. Cerebral volume not significantly changed from previous exam. No suspicious intracranial vascular hyperdensity. Calcified atherosclerosis. ORBITS: No convincing acute orbital injury. SINUSES: Paranasal sinuses remain well aerated. SOFT TISSUES AND SKULL: No convincing acute scalp soft tissue injury. Chronic appearing nasal bone fractures, unchanged from previous exam. Tympanic cavities and mastoids remain well aerated. IMPRESSION: 1.  Negative for age non-contrast head CT. 2. No acute traumatic injury identified. Chronic nasal bone fractures. Electronically signed by: Helayne Hurst MD 07/15/2024 10:12 AM EDT RP Workstation: [id number]   EKG: Independently reviewed.  Sinus rhythm at 96 beats minute.  Nonspecific T wave changes.  Some minimal baseline wander.  QTc okay at 457.  Assessment/Plan Active Problems:   Anxiety   Alcohol intoxication delirium   Alcohol abuse   Bipolar disorder, unspecified (HCC)   Noncompliance with medications   Polysubstance abuse (HCC)   Primary hypertension   Diabetic foot ulcer (HCC)   HHS (hypothenar hammer syndrome)   Type 2 diabetes mellitus with foot ulcer, with long-term current use of insulin  (HCC)   HHS Diabetes > History of diabetes and prior chronic foot wounds.  Some pain and possible worsening of foot wound as below. > Presented after blood sugars were reading greater than 900 at home.  Took several doses of insulin  at home. > Glucose here still 550.  No ketones to indicate DKA.  But with elevated blood sugar despite home insulin  will check serum osmolality to evaluate for HHS and treat for HHS/hyperglycemic crisis for the time being. - Admit to stepdown unit - Start on insulin  drip - Potassium supplementation: 20 mEq IV - LR at 125 mL/hr until CBG less than 250 - Switch to D5-LR  when 1 CBG less than 250 - BMET every 4 hours - CBG Q1H - Once CBG persistently controlled, administer home 70/30 insulin  15 units twice daily. - Continue insulin  drip for 1 more hour, then discontinue and start SSI-S  - DC fluids if eating, drinking, and off insulin  drip - Will allow diet for now as this will help with patient's ongoing intermittent agitation/anger.  Foot wound > Known history of diabetic foot wounds.  Has been treated for cellulitis of the neck recently but foot wounds were not  felt to be infected. > No leukocytosis at this time.  Afebrile.  Does have a foot wound to the right foot, mild overlying erythema.  There is some significant degree of swelling and certainly is tender.. > Has received vancomycin , cefepime , Flagyl  to cover for diabetic foot infection in the ED. > Procalcitonin also ordered and is pending. - Will continue with diabetic foot wound antibiotics overnight: Vancomycin , cefepime , Flagyl  - Follow-up procalcitonin - Trend fever curve and WBC - Patient now agreeable to foot x-ray - Follow-up blood cultures  Lactic acidosis > Initial lactic acid significant elevated to greater than 6.  Has received IV fluids since that time and agitation has improved.  Will continue to trend for now. - Trend lactic acid  Alcohol use Substance use Agitation Bipolar disorder Anxiety > History of bipolar disorder anxiety as well as significant substance and alcohol use. > Agitation and inappropriate/threatening speech in the ED. [place] when I evaluated patient.  He does acknowledge she has significant anger issues and at times he needs to be left alone and does better when he gets his home Klonopin  when needed. > Evaluated by psychiatry, currently does not have capacity.  Ethanol level was 326.  UDS positive for opiates and benzos. > Initial concern for needing Precedex  and ICU was consulted but he was calm down by the time of their evaluation.  Will be monitored in  stepdown for now as above so we will have options if anything worsens. > Psychiatry consulted as above and will follow back up with patient as intoxication improves. > Did have improvement with Ativan  in the  ED.  No significant history recently noted for alcohol withdrawal.  Will start on Librium  detox protocol and have his home Klonopin  available. - Monitoring on stepdown as above - Librium  detox protocol - CIWA without Ativan  for now, will add in later if needed. - Home Klonopin  as needed for anxiety - Thiamine , folate, multivitamin  Hypertension - Continue home enalapril , starting tomorrow  DVT prophylaxis: Lovenox  Code Status:   Full Family Communication:  None on admission Disposition Plan:   Patient is from:  Home  Anticipated DC to:  Home  Anticipated DC date:  1 to 3 days  Anticipated DC barriers: None  Consults called:  PCCM, signed off.  Psychiatry, following. Admission status:  Observation, stepdown, IVC.  Severity of Illness: The appropriate patient status for this patient is OBSERVATION. Observation status is judged to be reasonable and necessary in order to provide the required intensity of service to ensure the patient's safety. The patient's presenting symptoms, physical exam findings, and initial radiographic and laboratory data in the context of their medical condition is felt to place them at decreased risk for further clinical deterioration. Furthermore, it is anticipated that the patient will be medically stable for discharge from the hospital within 2 midnights of admission.    Andrew KATHEE Scurry MD [facility]  How to contact the [facility] Attending or Consulting provider 7A - 7P or covering provider during after hours 7P -7A, for this patient?   Check the care team in [facility] and look for a) attending/consulting [facility] provider listed and b) the [facility] team listed Log into [email or web address] and use [place]'s universal password to access. If you do not have the  password, please contact the hospital operator. Locate the [facility] provider you are looking for under [facility] and page to a number that you can be directly reached. If you still have difficulty reaching the provider, please page the [facility] (Director on Call) for the Hospitalists listed on amion for assistance.  07/15/2024, 12:47 PM

## 2024-07-15 NOTE — Plan of Care (Signed)
  Problem: Fluid Volume: Goal: Ability to achieve a balanced intake and output will improve Outcome: Progressing   Problem: Nutritional: Goal: Maintenance of adequate nutrition will improve Outcome: Progressing   Problem: Respiratory: Goal: Will regain and/or maintain adequate ventilation Outcome: Progressing

## 2024-07-15 NOTE — Progress Notes (Addendum)
 Pharmacy Antibiotic Note  Andrew Buchanan is a 53 y.o. male admitted on 07/15/2024 with HHS and Diabetic foot wound. Pharmacy has been consulted for Vancomycin  and Cefepime  dosing.  Plan: Vancomycin  1500mg  loading dose.  Vancomycin  1000 mg IV Q 12 hrs. Goal AUC 400-550. Expected AUC: 450, Css 11.9 SCr used: 0.95   Height: 6' (182.9 cm) Weight: 74.4 kg (164 lb) IBW/kg (Calculated) : 77.6  Temp (24hrs), Avg:98.1 F (36.7 C), Min:98.1 F (36.7 C), Max:98.1 F (36.7 C)  Recent Labs  Lab 07/15/24 0913 07/15/24 0922  WBC 7.2  --   CREATININE  --  1.26*  LATICACIDVEN  --  6.7*    Estimated Creatinine Clearance: 71.3 mL/min (A) (by C-G formula based on SCr of 1.26 mg/dL (H)).    Allergies  Allergen Reactions   Amlodipine Shortness Of Breath   Antihistamines, Chlorpheniramine-Type Anaphylaxis   Statins Other (See Comments)    Pt declines    Antimicrobials this admission: 10/8 Cefepime  >>  10/8 Vancomycin   >>  10/8 Flagyl >>  Dose adjustments this admission: N/A  Microbiology results: 10/8 BCx: Pending   Thank you for allowing pharmacy to be a part of this patient's care.  Estill CHRISTELLA Lutes, PharmD, BCPS Clinical Pharmacist 07/15/2024 1:12 PM

## 2024-07-15 NOTE — ED Notes (Signed)
 EDP at bedside

## 2024-07-15 NOTE — ED Notes (Signed)
 Attempted to call report x 1

## 2024-07-15 NOTE — ED Notes (Signed)
 Pt very verbally aggressive with staff. Pt yelling and cursing at staff as they were trying to check pt in. Pt informed we would tolerate pt yelling and cursing at staff. Plan made to have 2 staff members present in pts room when staff have to be in room. Security aware that pt is high violence risk and very verbally abusive to staff.

## 2024-07-15 NOTE — ED Notes (Signed)
 IVC PENDING  CONSULT ?

## 2024-07-15 NOTE — TOC Initial Note (Signed)
 Transition of Care [facility]) - Initial/Assessment Note    Patient Details  Name: Andrew Buchanan MRN: [id number] Date of Birth: [birth year]  Transition of Care [facility] CM/SW Contact:    [place] L Florabel Faulks, LCSW Phone Number: 07/15/2024, 2:21 PM  Clinical Narrative:                  TOC received a consult for substance abuse counseling/education. TOC does not provide substance abuse education/counseling. Resources for substance abuse have been added to the AVS.        Patient Goals and CMS Choice            Expected Discharge Plan and Services                                              Prior Living Arrangements/Services                       Activities of Daily Living      Permission Sought/Granted                  Emotional Assessment              Admission diagnosis:  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] Patient Active Problem List   Diagnosis Date Noted   HHS (hypothenar hammer syndrome) 07/15/2024   Hypokalemia 06/10/2024   Hypomagnesemia 06/10/2024   Neck abscess 06/10/2024   Diabetic foot ulcer (HCC) 11/14/2021   Type 2 diabetes mellitus with foot ulcer, with long-term current use of insulin  (HCC) 11/13/2021   Rhabdomyolysis 08/25/2021   Polysubstance abuse (HCC) 08/25/2021   Anxiety 08/25/2021   Elevated troponin 08/25/2021   Primary hypertension 08/25/2021   History of bacteremia 08/25/2021   Malnutrition of moderate degree 01/07/2021   Noncompliance with medications 05/19/2020   Alcohol intoxication delirium 11/26/2018   Alcohol abuse 11/26/2018   Bipolar disorder, unspecified (HCC) 11/26/2018   PCP:  System, Provider Not In Pharmacy:   [id number] [place], [place] - [address] AT [facility] OF [address] [place] [address] Phone: [phone] Fax: [phone]  [facility] [address] [place]  [address] Phone: [phone] Fax: [phone]     Social Drivers of Health (SDOH) Social History: SDOH Screenings   Food Insecurity: No Food Insecurity (06/11/2024)  Recent Concern: Food Insecurity - Food Insecurity Present (03/23/2024)   Received from [facility]  Housing: Low Risk  (06/11/2024)  Transportation Needs: No Transportation Needs (06/11/2024)  Recent Concern: Transportation Needs - Unmet Transportation Needs (03/23/2024)   Received from [facility]  Utilities: Not At Risk (06/11/2024)  Financial Resource Strain: Medium Risk (11/15/2022)   Received from [facility]  Physical Activity: Inactive (11/15/2022)   Received from [facility]  Social Connections: Socially Isolated (11/15/2022)   Received from [facility]  Stress: Stress Concern Present (11/15/2022)   Received from [facility]  Tobacco Use: High Risk (07/15/2024)  Health Literacy: Low Risk  (11/15/2022)   Received from [facility]   SDOH Interventions:     Readmission Risk Interventions     No data to display

## 2024-07-15 NOTE — Consult Note (Signed)
 [facility] Psychiatric Consult Initial  Patient Name: .Andrew Buchanan  MRN: [id number]  DOB: October 27, 1970  Consult Order details:  Orders (From admission, onward)     Start     Ordered   07/15/24 0948  IP CONSULT TO PSYCHIATRY       Ordering Provider: Dicky Anes, MD  Provider:  (Not yet assigned)  Question Answer Comment  Reason for consult: Other (see comments)   Comments: agitation, aggressive behavior, eval for medical decision making capacity and treatment recommendations for agitated behaviors      07/15/24 0948             Mode of Visit: In person    Psychiatry Consult Evaluation  Service Date: July 15, 2024 LOS:  LOS: 0 days  Chief Complaint Capacity Evaluation  Primary Psychiatric Diagnoses  Capacity Evaluation   Assessment  Andrew Buchanan is a 53 y.o. male admitted: Presented to the ED  Psychiatry was consulted to help assist with capacity evaluation of patient.  Patient appeared intoxicated on this providers exam, but adamantly denied any substance use including alcohol or any illicit drugs.  However, patient was slurring words and cussing loudly at staff.  Patient refused to participate in meaningful exam.  He quoted I will do what ever the doctor tells me to if you get me the fucking phone.  Patient can tell this provider his name and date of birth.  He reported the year was currently 2005.  He would not elaborate on the events that brought him to the hospital.  Nurse informed providers that patient's current alcohol level was 326.  At this time, psychiatry is in agreement with medical team that patient currently lacks capacity to make informed decisions due to his intoxicated state.  The following was assessed during interview:   Able to understand medical problem  Able to understand proposed treatment    Able to understand alternative to proposed treatment (if any)    Able to understand option of refusing proposed treatment (including  withholding or withdrawing proposed treatment)   Able to appreciate reasonably foreseeable consequences of accepting proposed treatment   After thorough assessment, it is our clinical opinion- psychiatry is in agreement with medical team that patient currently lacks capacity to make informed decisions due to his intoxicated state.  Capacity is not competency. Competency is determined by legal system and judge. Capacity can vary from time to time depending on the mental status of the patient.  Diagnoses:  Active Hospital problems: Active Problems:   Alcohol intoxication delirium   Alcohol abuse   Bipolar disorder, unspecified (HCC)   Noncompliance with medications   Polysubstance abuse (HCC)   Anxiety   Primary hypertension   Diabetic foot ulcer (HCC)   HHS (hypothenar hammer syndrome)   Type 2 diabetes mellitus with foot ulcer, with long-term current use of insulin  (HCC)    Plan   ## Psychiatric Medication Recommendations:  -None at this time.  ## Medical Decision Making Capacity: After thorough assessment, it is our clinical opinion- psychiatry is in agreement with medical team that patient currently lacks capacity to make informed decisions due to his intoxicated state.       Zelda Sharps, NP        History of Present Illness  Relevant Aspects of Hospital ED   Patient Report:  Psychiatry was consulted to help assist with capacity evaluation of patient.  Patient appeared intoxicated on this providers exam, but adamantly denied any substance use including alcohol or  any illicit drugs.  However, patient was slurring words and cussing loudly at staff.  Patient refused to participate in meaningful exam.  He quoted I will do what ever the doctor tells me to if you get me the fucking phone.  Patient can tell this provider his name and date of birth.  He reported the year was currently 2005.  He would not elaborate on the events that brought him to the hospital.  Nurse informed  providers that patient's current alcohol level was 326.  At this time, psychiatry is in agreement with medical team that patient currently lacks capacity to make informed decisions due to his intoxicated state.  The following was assessed during interview:   Able to understand medical problem  Able to understand proposed treatment    Able to understand alternative to proposed treatment (if any)    Able to understand option of refusing proposed treatment (including withholding or withdrawing proposed treatment)   Able to appreciate reasonably foreseeable consequences of accepting proposed treatment   After thorough assessment, it is our clinical opinion- psychiatry is in agreement with medical team that patient currently lacks capacity to make informed decisions due to his intoxicated state.  Capacity is not competency. Competency is determined by legal system and judge. Capacity can vary from time to time depending on the mental status of the patient.          Exam Findings  Physical Exam: Deferred to EDP- note reviewed   Vital Signs:  Temp:  [98.1 F (36.7 C)-98.2 F (36.8 C)] 98.2 F (36.8 C) (10/08 1329) Pulse Rate:  [81-93] 81 (10/08 1100) Resp:  [16-17] 17 (10/08 1100) BP: (137-145)/(91-94) 137/91 (10/08 1100) SpO2:  [99 %-100 %] 99 % (10/08 1100) Weight:  [74.4 kg] 74.4 kg (10/08 0906) Blood pressure (!) 137/91, pulse 81, temperature 98.2 F (36.8 C), temperature source Oral, resp. rate 17, height 6' (1.829 m), weight 74.4 kg, SpO2 99%. Body mass index is 22.24 kg/m.    Mental Status Exam: General Appearance: Disheveled  Orientation:  Other:  Oriented to self  Memory:  Immediate;   Poor Recent;   Poor Remote;   Poor  Concentration:  Concentration: Poor and Attention Span: Poor  Recall:  Poor  Attention  Poor  Eye Contact:  Fair  Speech:  Slurred  Language:  Fair  Volume:  Increased  Mood: fine  Affect:  Labile  Thought Process:  Irrelevant  Thought  Content:  Fixated on getting phone  Suicidal Thoughts:  No  Homicidal Thoughts:  No  Judgement:  Impaired  Insight:  Lacking  Psychomotor Activity:  Restlessness  Akathisia:  No  Fund of Knowledge:  Fair      Assets:  Housing  Cognition:  Impaired,  Moderate  ADL's:  Impaired  AIMS (if indicated):        Other History   These have been pulled in through the EMR, reviewed, and updated if appropriate.  Family History:  The patient's family history includes Alcoholism in his father.  Medical History: Past Medical History:  Diagnosis Date   AKI (acute kidney injury) 01/08/2021   Alcohol abuse    Anxiety    Cellulitis and abscess of neck 06/10/2024   Diabetes mellitus without complication (HCC)    Diabetic foot infection (HCC) 11/20/2023   DKA (diabetic ketoacidosis) (HCC) 05/19/2020   DKA, type 2 (HCC) 06/09/2024   History of MRSA infection 12/08/2023   11/2023 Diabetic foot wound requiring surgical debridement, cultures MRSA+  Hyperosmolar hyperglycemic state (HHS) (HCC) 01/05/2021   Hypertension    Pancreatitis    Sepsis with acute renal failure without septic shock (HCC)    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   Added automatically from request for surgery [id number]      Surgical History: Past Surgical History:  Procedure Laterality Date   HERNIA REPAIR     ROTATOR CUFF REPAIR Left      Medications:   Current Facility-Administered Medications:    ceFEPIme  (MAXIPIME ) 2 g in sodium chloride  0.9 % 100 mL IVPB, 2 g, Intravenous, Q8H, Melvin, Alexander B, MD   chlordiazePOXIDE  (LIBRIUM ) capsule 25 mg, 25 mg, Oral, QID, 25 mg at 07/15/24 1410 **FOLLOWED BY** [START ON 07/16/2024] chlordiazePOXIDE  (LIBRIUM ) capsule 25 mg, 25 mg, Oral, TID **FOLLOWED BY** [START ON 07/17/2024] chlordiazePOXIDE  (LIBRIUM ) capsule 25 mg, 25 mg, Oral, BH-qamhs **FOLLOWED BY** [START ON 07/18/2024] chlordiazePOXIDE  (LIBRIUM ) capsule 25 mg, 25 mg, Oral, Daily, Melvin, Alexander B, MD    clonazePAM  (KLONOPIN ) tablet 1 mg, 1 mg, Oral, BID PRN, Melvin, Alexander B, MD, 1 mg at 07/15/24 1302   dextrose  5 % in lactated ringers  infusion, , Intravenous, Continuous, Melvin, Alexander B, MD   dextrose  50 % solution 0-50 mL, 0-50 mL, Intravenous, PRN, Melvin, Alexander B, MD   [START ON 07/16/2024] enalapril  (VASOTEC ) tablet 20 mg, 20 mg, Oral, BID, Melvin, Alexander B, MD   enoxaparin  (LOVENOX ) injection 40 mg, 40 mg, Subcutaneous, Q24H, Melvin, Alexander B, MD   folic acid  (FOLVITE ) tablet 1 mg, 1 mg, Oral, Daily, Melvin, Alexander B, MD, 1 mg at 07/15/24 1303   hydrOXYzine  (ATARAX ) tablet 25 mg, 25 mg, Oral, Q6H PRN, Melvin, Alexander B, MD   insulin  regular, human (MYXREDLIN ) 100 units/ 100 mL infusion, , Intravenous, Continuous, Seena Marsa NOVAK, MD, Last Rate: 13 mL/hr at 07/15/24 1322, 13 Units/hr at 07/15/24 1322   lactated ringers  infusion, , Intravenous, Continuous, Melvin, Alexander B, MD   loperamide (IMODIUM) capsule 2-4 mg, 2-4 mg, Oral, PRN, Melvin, Alexander B, MD   metroNIDAZOLE  (FLAGYL ) IVPB 500 mg, 500 mg, Intravenous, Q12H, Melvin, Alexander B, MD   multivitamin with minerals tablet 1 tablet, 1 tablet, Oral, Daily, Melvin, Alexander B, MD, 1 tablet at 07/15/24 1303   ondansetron  (ZOFRAN -ODT) disintegrating tablet 4 mg, 4 mg, Oral, Q6H PRN, Melvin, Alexander B, MD   thiamine  (VITAMIN B1) tablet 100 mg, 100 mg, Oral, Daily **OR** thiamine  (VITAMIN B1) injection 100 mg, 100 mg, Intravenous, Daily, Melvin, Alexander B, MD, 100 mg at 07/15/24 1303   vancomycin  (VANCOREADY) IVPB 500 mg/100 mL, 500 mg, Intravenous, Once, Hallaji, Sheema M, RPH  Current Outpatient Medications:    clonazePAM  (KLONOPIN ) 1 MG tablet, Take 1 tablet (1 mg total) by mouth 2 (two) times daily as needed for up to 7 days., Disp: 14 tablet, Rfl: 0   gabapentin  (NEURONTIN ) 300 MG capsule, Take 2 capsules (600 mg total) by mouth 2 (two) times daily., Disp: 30 capsule, Rfl: 1   insulin  lispro (HUMALOG)  100 UNIT/ML injection, Inject 5-14 Units into the skin 3 (three) times daily with meals., Disp: , Rfl:    insulin  NPH-regular Human (HUMULIN  70/30) (70-30) 100 UNIT/ML injection, Inject 18 Units into the skin 2 (two) times daily with a meal., Disp: 10 mL, Rfl: 11   losartan  (COZAAR ) 100 MG tablet, Take 100 mg by mouth daily., Disp: , Rfl:    [START ON 07/19/2024] oxyCODONE -acetaminophen  (PERCOCET) 10-325 MG tablet, Take 1 tablet by mouth every 6 (six) hours as needed for pain., Disp: ,  Rfl:    blood glucose meter kit and supplies KIT, Dispense based on patient and insurance preference. Use up to four times daily as directed., Disp: 1 each, Rfl: 0   Continuous Glucose Sensor (DEXCOM G7 SENSOR) MISC, Apply to skin as directed. Change every 10 days., Disp: 1 each, Rfl: 0   Insulin  Pen Needle 31G X 5 MM MISC, 1 each by Does not apply route 4 (four) times daily., Disp: 100 each, Rfl: 2   pioglitazone  (ACTOS ) 15 MG tablet, Take 15 mg by mouth daily. (Patient not taking: Reported on 07/15/2024), Disp: , Rfl:   Allergies: Allergies  Allergen Reactions   Amlodipine Shortness Of Breath   Antihistamines, Chlorpheniramine-Type Anaphylaxis   Statins Other (See Comments)    Pt declines    Zelda Sharps, NP

## 2024-07-15 NOTE — Inpatient Diabetes Management (Signed)
 Inpatient Diabetes Program Recommendations  AACE/ADA: New Consensus Statement on Inpatient Glycemic Control   Target Ranges:  Prepandial:   less than 140 mg/dL      Peak postprandial:   less than 180 mg/dL (1-2 hours)      Critically ill patients:  140 - 180 mg/dL    Latest Reference Range & Units 07/15/24 09:09  Glucose-Capillary 70 - 99 mg/dL 508 (H)    Latest Reference Range & Units 06/10/24 14:30  Hemoglobin A1C 4.8 - 5.6 % 9.5 (H)   Review of Glycemic Control  Diabetes history: DM2 Outpatient Diabetes medications: Humulin  70/30 18 units BID, Actos  15 mg daily, Dexcom G7 Current orders for Inpatient glycemic control: None; in ED  Inpatient Diabetes Program Recommendations:    Insulin : Labs currently pending but CBG 491 mg/dl at 0:90 am today. If in DKA, will need IV insulin .  If NOT in DKA and will be admitted, please consider ordering 70/30 15 units BID, CBGs AC&HS, Novolog  0-6 units AC&HS.  NOTE: Patient is currently in ED with hyperglycemia and noted to have bilateral feet wounds. Patient is known to inpatient diabetes team and last inpatient 06/09/24-06/16/24 (was seen by inpatient diabetes several times during that admission). Patient refused to take Latus during last admission due to hypoglycemia with Lantus  use.   Thanks, Earnie Gainer, RN, MSN, [facility] Diabetes Coordinator Inpatient Diabetes Program [phone] (Team Pager from 8am to 5pm)

## 2024-07-15 NOTE — ED Notes (Signed)
 RN assisted pt to and from bathroom.

## 2024-07-15 NOTE — ED Provider Notes (Signed)
 [facility] Provider Note    Event Date/Time   First MD Initiated Contact with Patient 07/15/24 [phone]     (approximate)   History   Hyperglycemia   HPI  Andrew Buchanan is a 53 y.o. male with a history of type 2 diabetes, hypertension, alcohol abuse pancreatitis.  Reviewed discharge summary from September 9.  Treated for hyponatremia hypochloremia hypokalemia hyperglycemia as well as antibiotics for neck abscess.  Also having episodes of agitation, benzodiazepine dependence.  Was treated for DKA   Patient has had soreness and a developing sore over his right foot for about a day or 2.  Yesterday it hurt and caused him to get off balance he fell striking his face.  He has some tenderness and had a small abrasion over the bridge of the nose.  Did not lose consciousness  At today he has noticed his blood sugars been very high reports it was greater than 900 at his house today.  He typically wants his blood sugar to be around 1-200.  He uses insulin , reports he used 3 doses of insulin  this morning before coming into the ER  Pain over the right bottom foot.  He has a diabetic sore developing  No nausea no vomiting no abdominal pain no headache no chest pain or shortness of breath.  He reports he used to drink alcohol heavily but does not any longer  Physical Exam   Triage Vital Signs: ED Triage Vitals [07/15/24 0905]  Encounter Vitals Group     BP (!) 145/94     Girls Systolic BP Percentile      Girls Diastolic BP Percentile      Boys Systolic BP Percentile      Boys Diastolic BP Percentile      Pulse Rate 93     Resp 16     Temp      Temp src      SpO2 100 %     Weight      Height      Head Circumference      Peak Flow      Pain Score      Pain Loc      Pain Education      Exclude from Growth Chart     Most recent vital signs: Vitals:   07/15/24 0906 07/15/24 1100  BP:  (!) 137/91  Pulse:  81  Resp:  17  Temp: 98.1 F (36.7 C)    SpO2:  99%     General: Awake, no distress.  He seems somewhat anxious, he is rather belligerent yelling curse words at the charge nurse on entry. [He was asked very frankly into calm his behavior, not use cursory words towards nursing staff or verbally attack of our staff which she was doing when I entered the room] CV:  Good peripheral perfusion.  Normal tones and rate Resp:  Normal effort.  Clear bilateral Abd:  No distention.  Soft nontender nondistended Other:  See media upload right foot.  Poor but palpable dorsalis pedis pulses in both feet.  Warm well-perfused toes.  The right foot has a apparently a large area of skin breakdown and appears to be ulcer formation with erythema and warmth surrounding.  There is no tracking up the leg.  There is no obvious cellulitic changes overlying the remainder of the foot or ankle or leg.  There is a somewhat foul odor   ED Results / Procedures / Treatments  Labs (all labs ordered are listed, but only abnormal results are displayed) Labs Reviewed  CBC - Abnormal; Notable for the following components:      Result Value   RBC 3.33 (*)    Hemoglobin 11.5 (*)    HCT 34.4 (*)    MCV 103.3 (*)    MCH 34.5 (*)    All other components within normal limits  URINE DRUG SCREEN, QUALITATIVE ([facility] ONLY) - Abnormal; Notable for the following components:   Opiate, Ur Screen POSITIVE (*)    Benzodiazepine, Ur Scrn POSITIVE (*)    All other components within normal limits  URINALYSIS, ROUTINE W REFLEX MICROSCOPIC - Abnormal; Notable for the following components:   Color, Urine COLORLESS (*)    APPearance CLEAR (*)    Glucose, UA >=500 (*)    All other components within normal limits  LACTIC ACID, PLASMA - Abnormal; Notable for the following components:   Lactic Acid, Venous 6.7 (*)    All other components within normal limits  PROTIME-INR - Abnormal; Notable for the following components:   Prothrombin Time 16.2 (*)    All other components within  normal limits  AMMONIA - Abnormal; Notable for the following components:   Ammonia 52 (*)    All other components within normal limits  COMPREHENSIVE METABOLIC PANEL WITH GFR - Abnormal; Notable for the following components:   Glucose, Bld 550 (*)    Creatinine, Ser 1.26 (*)    Calcium  8.3 (*)    Albumin 3.2 (*)    AST 355 (*)    ALT 57 (*)    All other components within normal limits  ETHANOL - Abnormal; Notable for the following components:   Alcohol, Ethyl (B) 326 (*)    All other components within normal limits  CBG MONITORING, ED - Abnormal; Notable for the following components:   Glucose-Capillary 491 (*)    All other components within normal limits  CBG MONITORING, ED - Abnormal; Notable for the following components:   Glucose-Capillary 457 (*)    All other components within normal limits  CULTURE, BLOOD (ROUTINE X 2)  CULTURE, BLOOD (ROUTINE X 2)  BLOOD GAS, VENOUS  BETA-HYDROXYBUTYRIC ACID  PROCALCITONIN  LACTIC ACID, PLASMA  LIPASE, BLOOD  BASIC METABOLIC PANEL WITH GFR  BASIC METABOLIC PANEL WITH GFR  BASIC METABOLIC PANEL WITH GFR  BASIC METABOLIC PANEL WITH GFR  OSMOLALITY     RADIOLOGY  CT Cervical Spine Wo Contrast Result Date: 07/15/2024 EXAM: CT CERVICAL SPINE WITHOUT CONTRAST 07/15/2024 09:59:13 AM TECHNIQUE: CT of the cervical spine was performed without the administration of intravenous contrast. Multiplanar reformatted images are provided for review. Automated exposure control, iterative reconstruction, and/or weight based adjustment of the mA/kV was utilized to reduce the radiation dose to as low as reasonably achievable. COMPARISON: Cervical spine CT 01/05/2021. CLINICAL HISTORY: 53 year old male with blunt facial trauma and reported nasal fracture from yesterday. FINDINGS: CERVICAL SPINE: BONES AND ALIGNMENT: Stable and normal cervical lordosis. Normal background mineralization. Chronic and unhealed bilateral C6 lamina/spinous process fracture as seen on  series 6 image 73, unchanged from prior. Superimposed probable healed posterior lamina and spinous process fracture at C5, stable. No acute osseous abnormality identified. DEGENERATIVE CHANGES: Cervical spine degeneration with no significant spinal stenosis by CT. SOFT TISSUES: No prevertebral soft tissue swelling. IMPRESSION: 1. No acute traumatic injury in the cervical spine. 2. Stable Chronic and unhealed C6 lamina/spinous process fracture. Electronically signed by: Helayne Hurst MD 07/15/2024 10:21 AM EDT RP Workstation:  [id number]   CT Head Wo Contrast Result Date: 07/15/2024 EXAM: CT HEAD WITHOUT CONTRAST 07/15/2024 09:59:13 AM TECHNIQUE: CT of the head was performed without the administration of intravenous contrast. Automated exposure control, iterative reconstruction, and/or weight based adjustment of the mA/kV was utilized to reduce the radiation dose to as low as reasonably achievable. COMPARISON: Head CT 08/25/2021. CLINICAL HISTORY: 52 year old male with blunt facial trauma, dried blood to nose, and reported nasal fracture from yesterday. FINDINGS: BRAIN AND VENTRICLES: No acute hemorrhage. No evidence of acute infarct. No hydrocephalus. No extra-axial collection. No mass effect or midline shift. Cerebral volume not significantly changed from previous exam. No suspicious intracranial vascular hyperdensity. Calcified atherosclerosis. ORBITS: No convincing acute orbital injury. SINUSES: Paranasal sinuses remain well aerated. SOFT TISSUES AND SKULL: No convincing acute scalp soft tissue injury. Chronic appearing nasal bone fractures, unchanged from previous exam. Tympanic cavities and mastoids remain well aerated. IMPRESSION: 1. Negative for age non-contrast head CT. 2. No acute traumatic injury identified. Chronic nasal bone fractures. Electronically signed by: Helayne Hurst MD 07/15/2024 10:12 AM EDT RP Workstation: [id number]    CT head and cervical spine negative for acute    EKG inter by me at  10 AM heart rate 95 QRS 90 QTc 460 Sinus tachycardia no evidence of acute ischemia.  PROCEDURES:  Critical Care performed: Yes, see critical care procedure note(s)  CRITICAL CARE Performed by: Oneil Budge   Total critical care time: 35 minutes  Critical care time was exclusive of separately billable procedures and treating other patients.  Critical care was necessary to treat or prevent imminent or life-threatening deterioration.  Critical care was time spent personally by me on the following activities: development of treatment plan with patient and/or surrogate as well as nursing, discussions with consultants, evaluation of patient's response to treatment, examination of patient, obtaining history from patient or surrogate, ordering and performing treatments and interventions, ordering and review of laboratory studies, ordering and review of radiographic studies, pulse oximetry and re-evaluation of patient's condition.   Procedures   MEDICATIONS ORDERED IN ED: Medications  enalapril  (VASOTEC ) tablet 20 mg (has no administration in time range)  enoxaparin  (LOVENOX ) injection 40 mg (has no administration in time range)  insulin  regular, human (MYXREDLIN ) 100 units/ 100 mL infusion (has no administration in time range)  lactated ringers  infusion (has no administration in time range)  dextrose  5 % in lactated ringers  infusion (has no administration in time range)  dextrose  50 % solution 0-50 mL (has no administration in time range)  potassium chloride  10 mEq in 100 mL IVPB (has no administration in time range)  LORazepam  (ATIVAN ) tablet 1-4 mg (has no administration in time range)    Or  LORazepam  (ATIVAN ) injection 1-4 mg (has no administration in time range)  thiamine  (VITAMIN B1) tablet 100 mg (has no administration in time range)    Or  thiamine  (VITAMIN B1) injection 100 mg (has no administration in time range)  folic acid  (FOLVITE ) tablet 1 mg (has no administration in time  range)  multivitamin with minerals tablet 1 tablet (has no administration in time range)  hydrOXYzine  (ATARAX ) tablet 25 mg (has no administration in time range)  loperamide (IMODIUM) capsule 2-4 mg (has no administration in time range)  ondansetron  (ZOFRAN -ODT) disintegrating tablet 4 mg (has no administration in time range)  chlordiazePOXIDE  (LIBRIUM ) capsule 25 mg (has no administration in time range)    Followed by  chlordiazePOXIDE  (LIBRIUM ) capsule 25 mg (has no administration in time range)  Followed by  chlordiazePOXIDE  (LIBRIUM ) capsule 25 mg (has no administration in time range)    Followed by  chlordiazePOXIDE  (LIBRIUM ) capsule 25 mg (has no administration in time range)  chlordiazePOXIDE  (LIBRIUM ) capsule 25 mg (has no administration in time range)  sodium chloride  0.9 % bolus 1,000 mL (0 mLs Intravenous Stopped 07/15/24 1036)  LORazepam  (ATIVAN ) injection 2 mg (2 mg Intravenous Given 07/15/24 0927)  ceFEPIme  (MAXIPIME ) 2 g in sodium chloride  0.9 % 100 mL IVPB (0 g Intravenous Stopped 07/15/24 1036)  metroNIDAZOLE  (FLAGYL ) IVPB 500 mg (0 mg Intravenous Stopped 07/15/24 1106)  vancomycin  (VANCOCIN ) IVPB 1000 mg/200 mL premix (0 mg Intravenous Stopped 07/15/24 1158)  LORazepam  (ATIVAN ) injection 1 mg (1 mg Intravenous Given 07/15/24 1012)  oxyCODONE -acetaminophen  (PERCOCET/ROXICET) 5-325 MG per tablet 2 tablet (2 tablets Oral Given 07/15/24 1013)  LORazepam  (ATIVAN ) injection 1 mg (1 mg Intravenous Given 07/15/24 1040)  LORazepam  (ATIVAN ) injection 2 mg (2 mg Intravenous Given 07/15/24 1057)  sodium chloride  0.9 % bolus 500 mL (0 mLs Intravenous Stopped 07/15/24 1156)     IMPRESSION / MDM / ASSESSMENT AND PLAN / ED COURSE  I reviewed the triage vital signs and the nursing notes.                              Differential diagnosis includes, but is not limited to, hyperglycemic crisis, DKA, HHS etc..  He appears slightly dehydrated.  Abrasion of the nose otherwise normocephalic  atraumatic.  Denies neck pain or neck injury.  He also has a wound on the right foot that looks concerning for an early but infected diabetic foot sore.  Will start broad-spectrum antibiotic coverage.  Reviewed antibiotic allergies with the patient for which she has none.  Will hydrate await labs diabetes coordinator consult.  Imaging head and neck ordered to evaluate for trauma  Patient's presentation is most consistent with acute presentation with potential threat to life or bodily function.  Patient rather anxious, abrupt, and yelling curse her words and nursing staff.  He does take clonazepam  has a history of mental illness.  I will give him lorazepam , he reports he no longer uses alcohol but review of records seems to indicate that he may have chronic alcohol abuse not certain if he is telling me accurate information.    The patient is on the cardiac monitor to evaluate for evidence of arrhythmia and/or significant heart rate changes.   Clinical Course as of 07/15/24 1237  Wed Jul 15, 2024  9046 Patient is being very aggressive to staff.  Also verbally aggressive with x-ray staff.  He has declined x-ray of the right foot.  At this time, have requested that security be present with staff anytime they are interacting with the patient. [MQ]  1004 Patient is insistent that he receive some crackers and water otherwise threatening to leave.  It is unclear to me if he has good understanding capacity of situation.  I have ordered psychiatry consult for his behavioral symptoms, agitation and to establish capacity.  He has made threats to leave AGAINST MEDICAL ADVICE, but at this time is still amenable to care.  He is more calm now but still gets easily agitated or upset.  At this juncture I do not think that he necessarily has to be under involuntary commitment.   [MQ]  1045 Lactic Acid, Venous(!!): 6.7 Patient critically elevated lactic acid.  Receiving IV fluids for fluid resuscitation.  He does also  have evidence of infection of the right foot, but based on his presentation of severe hyperglycemia I also suspect his lactate may be elevated due to increased metabolic demand and may not necessarily be secondary to severe infection as he has normal white count no fever. [MQ]  1045 Seen by psychiatry, they advised currently he does not have capacity to make informed medical decisions.  He currently has evidence of intoxication with a blood alcohol 326 though the patient told both myself and psychiatrist that he does not use alcohol any longer this is clearly incorrect   [MQ]  1046 Patient is yelling agitated once again.  Will give additional lorazepam . [MQ]  1047 I have ordered additional 500 mL normal saline for treatment with 20 mL/kg of crystalloid [MQ]  1101 Consult placed to ICU team as patient having agitation, now under involuntary commitment.  Has been seen by psychiatry and they have deemed he has lack of capacity to make medical decisions at this time partially and related to alcohol intoxication.  Psychiatry plans to reround reevaluate for IVC/capacity later on today as well VIRGIN Sharps, NP) [MQ]  [phone] Patient now calmer after additional Ativan .  Awaiting chemistry panel, delay due to issue with chemistry analyzer per laboratory staff [MQ]  1119 Lab results demonstrate no evidence of DKA.  Consulted with diabetes coordinator.  Will start 70/30.  ICU team Inge Lecher here to evaluate for admission due to degree of agitation and behavioral concerns as well as underlying medical illness.  Thankfully not in DKA at this time.  Broad-spectrum antibiotics, lorazepam  being moderately successful in controlling patient's symptoms at this time but requiring frequent redosing and I suspect his level of agitation would not be appropriate for the floor [MQ]  1236 Patient admitted to the care of Dr. Seena, hospitalist. [MQ]    Clinical Course User Index [MQ] Dicky Anes, MD   Patient admitted to  hospitalist.  FINAL CLINICAL IMPRESSION(S) / ED DIAGNOSES   Final diagnoses:  Abrasion, nose w/o infection  Diabetic infection of right foot (HCC)  Hyperglycemia     Rx / DC Orders   ED Discharge Orders     None        Note:  This document was prepared using Dragon voice recognition software and may include unintentional dictation errors.   Dicky Anes, MD 07/15/24 [phone]

## 2024-07-15 NOTE — ED Notes (Signed)
 Attempted to call report x2

## 2024-07-15 NOTE — ED Notes (Addendum)
 CALLED RN HENRY SPOKE WITH ASHLEY  M TO INFORM PT HAS A BED ASSISGNMENT

## 2024-07-15 NOTE — ED Notes (Signed)
 Pt screaming at staff, threatening staff. Security at bedside. EDP aware.

## 2024-07-15 NOTE — ED Notes (Signed)
RN gave pt blanket.

## 2024-07-15 NOTE — ED Notes (Signed)
 Pt refusing xray again, cursing at staff, threatening staff.

## 2024-07-15 NOTE — ED Triage Notes (Signed)
 Brought in by EMS from home due to hyperglycemia. CBG 480 with EMS.

## 2024-07-16 ENCOUNTER — Inpatient Hospital Stay

## 2024-07-16 DIAGNOSIS — I7389 Other specified peripheral vascular diseases: Secondary | ICD-10-CM | POA: Diagnosis present

## 2024-07-16 DIAGNOSIS — G894 Chronic pain syndrome: Secondary | ICD-10-CM | POA: Diagnosis present

## 2024-07-16 DIAGNOSIS — F319 Bipolar disorder, unspecified: Secondary | ICD-10-CM

## 2024-07-16 DIAGNOSIS — E872 Acidosis, unspecified: Secondary | ICD-10-CM

## 2024-07-16 DIAGNOSIS — R739 Hyperglycemia, unspecified: Secondary | ICD-10-CM | POA: Diagnosis present

## 2024-07-16 DIAGNOSIS — E11621 Type 2 diabetes mellitus with foot ulcer: Secondary | ICD-10-CM | POA: Diagnosis present

## 2024-07-16 DIAGNOSIS — L089 Local infection of the skin and subcutaneous tissue, unspecified: Secondary | ICD-10-CM

## 2024-07-16 DIAGNOSIS — M25561 Pain in right knee: Secondary | ICD-10-CM | POA: Diagnosis not present

## 2024-07-16 DIAGNOSIS — F419 Anxiety disorder, unspecified: Secondary | ICD-10-CM | POA: Diagnosis present

## 2024-07-16 DIAGNOSIS — E1142 Type 2 diabetes mellitus with diabetic polyneuropathy: Secondary | ICD-10-CM | POA: Diagnosis present

## 2024-07-16 DIAGNOSIS — F1910 Other psychoactive substance abuse, uncomplicated: Secondary | ICD-10-CM | POA: Diagnosis not present

## 2024-07-16 DIAGNOSIS — F10231 Alcohol dependence with withdrawal delirium: Secondary | ICD-10-CM | POA: Diagnosis present

## 2024-07-16 DIAGNOSIS — E1165 Type 2 diabetes mellitus with hyperglycemia: Secondary | ICD-10-CM

## 2024-07-16 DIAGNOSIS — Z7984 Long term (current) use of oral hypoglycemic drugs: Secondary | ICD-10-CM | POA: Diagnosis not present

## 2024-07-16 DIAGNOSIS — Z794 Long term (current) use of insulin: Secondary | ICD-10-CM | POA: Diagnosis not present

## 2024-07-16 DIAGNOSIS — F1110 Opioid abuse, uncomplicated: Secondary | ICD-10-CM | POA: Diagnosis present

## 2024-07-16 DIAGNOSIS — I10 Essential (primary) hypertension: Secondary | ICD-10-CM | POA: Diagnosis present

## 2024-07-16 DIAGNOSIS — E875 Hyperkalemia: Secondary | ICD-10-CM | POA: Diagnosis present

## 2024-07-16 DIAGNOSIS — B9561 Methicillin susceptible Staphylococcus aureus infection as the cause of diseases classified elsewhere: Secondary | ICD-10-CM

## 2024-07-16 DIAGNOSIS — R7881 Bacteremia: Secondary | ICD-10-CM | POA: Diagnosis present

## 2024-07-16 DIAGNOSIS — N179 Acute kidney failure, unspecified: Secondary | ICD-10-CM | POA: Diagnosis present

## 2024-07-16 DIAGNOSIS — M8448XA Pathological fracture, other site, initial encounter for fracture: Secondary | ICD-10-CM | POA: Diagnosis present

## 2024-07-16 DIAGNOSIS — F10121 Alcohol abuse with intoxication delirium: Secondary | ICD-10-CM | POA: Diagnosis not present

## 2024-07-16 DIAGNOSIS — E11628 Type 2 diabetes mellitus with other skin complications: Principal | ICD-10-CM

## 2024-07-16 DIAGNOSIS — E871 Hypo-osmolality and hyponatremia: Secondary | ICD-10-CM | POA: Diagnosis present

## 2024-07-16 DIAGNOSIS — W19XXXA Unspecified fall, initial encounter: Secondary | ICD-10-CM | POA: Diagnosis present

## 2024-07-16 DIAGNOSIS — F10939 Alcohol use, unspecified with withdrawal, unspecified: Secondary | ICD-10-CM

## 2024-07-16 DIAGNOSIS — F10229 Alcohol dependence with intoxication, unspecified: Secondary | ICD-10-CM | POA: Diagnosis present

## 2024-07-16 DIAGNOSIS — Y908 Blood alcohol level of 240 mg/100 ml or more: Secondary | ICD-10-CM | POA: Diagnosis present

## 2024-07-16 DIAGNOSIS — F1320 Sedative, hypnotic or anxiolytic dependence, uncomplicated: Secondary | ICD-10-CM | POA: Diagnosis present

## 2024-07-16 DIAGNOSIS — B9562 Methicillin resistant Staphylococcus aureus infection as the cause of diseases classified elsewhere: Secondary | ICD-10-CM

## 2024-07-16 DIAGNOSIS — E860 Dehydration: Secondary | ICD-10-CM | POA: Diagnosis present

## 2024-07-16 DIAGNOSIS — L97412 Non-pressure chronic ulcer of right heel and midfoot with fat layer exposed: Secondary | ICD-10-CM

## 2024-07-16 DIAGNOSIS — D649 Anemia, unspecified: Secondary | ICD-10-CM | POA: Diagnosis present

## 2024-07-16 DIAGNOSIS — R296 Repeated falls: Secondary | ICD-10-CM

## 2024-07-16 LAB — BASIC METABOLIC PANEL WITH GFR
Anion gap: 10 (ref 5–15)
Anion gap: 13 (ref 5–15)
BUN: 12 mg/dL (ref 6–20)
BUN: 13 mg/dL (ref 6–20)
CO2: 23 mmol/L (ref 22–32)
CO2: 24 mmol/L (ref 22–32)
Calcium: 7.5 mg/dL — ABNORMAL LOW (ref 8.9–10.3)
Calcium: 8.1 mg/dL — ABNORMAL LOW (ref 8.9–10.3)
Chloride: 101 mmol/L (ref 98–111)
Chloride: 103 mmol/L (ref 98–111)
Creatinine, Ser: 1.05 mg/dL (ref 0.61–1.24)
Creatinine, Ser: 1.12 mg/dL (ref 0.61–1.24)
GFR, Estimated: >60 mL/min (ref >60)
GFR, Estimated: >60 mL/min (ref >60)
Glucose, Bld: 165 mg/dL — ABNORMAL HIGH (ref 70–99)
Glucose, Bld: 99 mg/dL (ref 70–99)
Potassium: 4.3 mmol/L (ref 3.5–5.1)
Potassium: 4.3 mmol/L (ref 3.5–5.1)
Sodium: 134 mmol/L — ABNORMAL LOW (ref 135–145)
Sodium: 140 mmol/L (ref 135–145)

## 2024-07-16 LAB — BLOOD CULTURE ID PANEL (REFLEXED) - BCID2

## 2024-07-16 LAB — CBC WITH DIFFERENTIAL/PLATELET
Abs Immature Granulocytes: 0.05 K/uL (ref 0.00–0.07)
Basophils Absolute: 0 K/uL (ref 0.0–0.1)
Basophils Relative: 0 %
Eosinophils Absolute: 0.1 K/uL (ref 0.0–0.5)
Eosinophils Relative: 1 %
HCT: 30.5 % — ABNORMAL LOW (ref 39.0–52.0)
Hemoglobin: 10.5 g/dL — ABNORMAL LOW (ref 13.0–17.0)
Immature Granulocytes: 1 %
Lymphocytes Relative: 26 %
Lymphs Abs: 1.8 K/uL (ref 0.7–4.0)
MCH: 35 pg — ABNORMAL HIGH (ref 26.0–34.0)
MCHC: 34.4 g/dL (ref 30.0–36.0)
MCV: 101.7 fL — ABNORMAL HIGH (ref 80.0–100.0)
Monocytes Absolute: 0.2 K/uL (ref 0.1–1.0)
Monocytes Relative: 4 %
Neutro Abs: 4.6 K/uL (ref 1.7–7.7)
Neutrophils Relative %: 68 %
Platelets: 245 K/uL (ref 150–400)
RBC: 3 MIL/uL — ABNORMAL LOW (ref 4.22–5.81)
RDW: 12.4 % (ref 11.5–15.5)
WBC: 6.8 K/uL (ref 4.0–10.5)
nRBC: 0 % (ref 0.0–0.2)

## 2024-07-16 LAB — GLUCOSE, CAPILLARY
Glucose-Capillary: 96 mg/dL (ref 70–99)
Glucose-Capillary: 133 mg/dL — ABNORMAL HIGH (ref 70–99)
Glucose-Capillary: 100 mg/dL — ABNORMAL HIGH (ref 70–99)
Glucose-Capillary: 115 mg/dL — ABNORMAL HIGH (ref 70–99)
Glucose-Capillary: 165 mg/dL — ABNORMAL HIGH (ref 70–99)
Glucose-Capillary: 176 mg/dL — ABNORMAL HIGH (ref 70–99)
Glucose-Capillary: 163 mg/dL — ABNORMAL HIGH (ref 70–99)

## 2024-07-16 LAB — C DIFFICILE QUICK SCREEN W PCR REFLEX
C Diff antigen: NEGATIVE
C Diff interpretation: NOT DETECTED
C Diff toxin: NEGATIVE

## 2024-07-16 LAB — LACTIC ACID, PLASMA
Lactic Acid, Venous: 2.3 mmol/L (ref 0.5–1.9)
Lactic Acid, Venous: 4 mmol/L (ref 0.5–1.9)

## 2024-07-16 MED ORDER — GABAPENTIN 300 MG PO CAPS
600.0000 mg | ORAL_CAPSULE | Freq: Two times a day (BID) | ORAL | Status: DC
  Administered 2024-07-16 – 2024-07-20 (×9): 600 mg via ORAL
  Filled 2024-07-16 (×9): qty 2

## 2024-07-16 MED ORDER — ACETAMINOPHEN 325 MG PO TABS
650.0000 mg | ORAL_TABLET | Freq: Four times a day (QID) | ORAL | Status: DC | PRN
  Administered 2024-07-16 – 2024-07-20 (×9): 650 mg via ORAL
  Filled 2024-07-16 (×9): qty 2

## 2024-07-16 MED ORDER — INSULIN ASPART 100 UNIT/ML IJ SOLN
0.0000 [IU] | Freq: Every day | INTRAMUSCULAR | Status: DC
  Administered 2024-07-17: 3 [IU] via SUBCUTANEOUS
  Administered 2024-07-18: 2 [IU] via SUBCUTANEOUS
  Filled 2024-07-16 (×3): qty 1

## 2024-07-16 MED ORDER — SODIUM CHLORIDE 0.9 % IV SOLN
12.5000 mg | Freq: Once | INTRAVENOUS | Status: AC
  Administered 2024-07-16: 12.5 mg via INTRAVENOUS
  Filled 2024-07-16: qty 0.5

## 2024-07-16 MED ORDER — SODIUM CHLORIDE 0.9 % IV BOLUS
500.0000 mL | Freq: Once | INTRAVENOUS | Status: AC
  Administered 2024-07-16: 500 mL via INTRAVENOUS

## 2024-07-16 MED ORDER — ACETAMINOPHEN 325 MG PO TABS
325.0000 mg | ORAL_TABLET | ORAL | Status: DC | PRN
  Administered 2024-07-16 – 2024-07-20 (×15): 325 mg via ORAL
  Filled 2024-07-16 (×17): qty 1

## 2024-07-16 MED ORDER — OXYCODONE HCL 5 MG PO TABS
10.0000 mg | ORAL_TABLET | ORAL | Status: DC | PRN
  Administered 2024-07-16 – 2024-07-20 (×21): 10 mg via ORAL
  Filled 2024-07-16 (×21): qty 2

## 2024-07-16 MED ORDER — INSULIN GLARGINE 100 UNIT/ML ~~LOC~~ SOLN
8.0000 [IU] | SUBCUTANEOUS | Status: DC
  Administered 2024-07-16 – 2024-07-17 (×2): 8 [IU] via SUBCUTANEOUS
  Filled 2024-07-16 (×2): qty 0.08

## 2024-07-16 MED ORDER — INSULIN ASPART 100 UNIT/ML IJ SOLN
0.0000 [IU] | Freq: Three times a day (TID) | INTRAMUSCULAR | Status: DC
  Administered 2024-07-16 (×2): 2 [IU] via SUBCUTANEOUS
  Administered 2024-07-17 – 2024-07-18 (×3): 7 [IU] via SUBCUTANEOUS
  Administered 2024-07-18: 5 [IU] via SUBCUTANEOUS
  Administered 2024-07-18: 3 [IU] via SUBCUTANEOUS
  Administered 2024-07-19: 2 [IU] via SUBCUTANEOUS
  Administered 2024-07-19 – 2024-07-20 (×2): 7 [IU] via SUBCUTANEOUS
  Administered 2024-07-20: 1 [IU] via SUBCUTANEOUS
  Filled 2024-07-16 (×11): qty 1

## 2024-07-16 MED ORDER — OXYCODONE HCL 5 MG PO TABS
10.0000 mg | ORAL_TABLET | ORAL | Status: DC | PRN
  Administered 2024-07-16 (×2): 10 mg via ORAL
  Filled 2024-07-16 (×2): qty 2

## 2024-07-16 MED ORDER — DEXMEDETOMIDINE HCL IN NACL 400 MCG/100ML IV SOLN
0.0000 ug/kg/h | INTRAVENOUS | Status: DC
  Administered 2024-07-16: 0.4 ug/kg/h via INTRAVENOUS
  Filled 2024-07-16: qty 100

## 2024-07-16 MED ORDER — VANCOMYCIN HCL 750 MG/150ML IV SOLN
750.0000 mg | Freq: Two times a day (BID) | INTRAVENOUS | Status: DC
  Administered 2024-07-16 – 2024-07-19 (×6): 750 mg via INTRAVENOUS
  Filled 2024-07-16 (×7): qty 150

## 2024-07-16 NOTE — Progress Notes (Signed)
       CROSS COVER NOTE  NAME: Andrew Buchanan MRN: [id number] DOB : 14-Aug-1971    Concern as stated by nurse / staff   Continued agitation related to alcohol withdrawal throughout the night not improved with Ativan      Pertinent findings on chart review:   Patient Assessment    Assessment and  Interventions   Assessment:  Acute alcohol withdrawal with delirium and agitation failing librium  and ativan   Plan: Precedex  ordered-(avoiding atypical antipsychotics for agitation to avoid precipitating seizures) X X

## 2024-07-16 NOTE — Assessment & Plan Note (Addendum)
 With hyperglycemia.  Patient admitted with uncontrolled diabetes with a sugar of 550.  Patient was initially placed on insulin  drip.  Patient has had sugars that have been low and also very high during the hospital course.  He can go back on his usual regimen as outpatient.

## 2024-07-16 NOTE — Plan of Care (Signed)

## 2024-07-16 NOTE — Plan of Care (Signed)
  Problem: Fluid Volume: Goal: Ability to maintain a balanced intake and output will improve Outcome: Progressing   Problem: Metabolic: Goal: Ability to maintain appropriate glucose levels will improve Outcome: Progressing   Problem: Skin Integrity: Goal: Risk for impaired skin integrity will decrease Outcome: Progressing   Problem: Tissue Perfusion: Goal: Adequacy of tissue perfusion will improve Outcome: Progressing   Problem: Metabolic: Goal: Ability to maintain appropriate glucose levels will improve Outcome: Progressing   Problem: Urinary Elimination: Goal: Ability to achieve and maintain adequate renal perfusion and functioning will improve Outcome: Progressing   Problem: Clinical Measurements: Goal: Will remain free from infection Outcome: Progressing Goal: Diagnostic test results will improve Outcome: Progressing   Problem: Activity: Goal: Risk for activity intolerance will decrease Outcome: Progressing   Problem: Education: Goal: Ability to describe self-care measures that may prevent or decrease complications (Diabetes Survival Skills Education) will improve Outcome: Not Progressing   Problem: Coping: Goal: Ability to adjust to condition or change in health will improve Outcome: Not Progressing   Problem: Health Behavior/Discharge Planning: Goal: Ability to manage health-related needs will improve Outcome: Not Progressing   Problem: Coping: Goal: Level of anxiety will decrease Outcome: Not Progressing
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
## 2024-07-16 NOTE — Progress Notes (Signed)
 PHARMACY - PHYSICIAN COMMUNICATION CRITICAL VALUE ALERT - BLOOD CULTURE IDENTIFICATION (BCID)  Andrew Buchanan is an 53 y.o. male who presented to [facility] on 07/15/2024 with a chief complaint of HHS  Assessment:  MRSA in 1 of 4 bottles (anaerobic) (include suspected source if known)  Name of physician (or Provider) Contacted: [place]  Current antibiotics: Flagy, cefepime  ,  vancomycin    Changes to prescribed antibiotics recommended:  - will continue current abx regimen and defer to AM staff to d/c flagyl  , cefepime    Results for orders placed or performed during the hospital encounter of 07/15/24  Blood Culture ID Panel (Reflexed) (Collected: 07/15/2024  9:13 AM)  Result Value Ref Range   Enterococcus faecalis NOT DETECTED NOT DETECTED   Enterococcus Faecium NOT DETECTED NOT DETECTED   Listeria monocytogenes NOT DETECTED NOT DETECTED   Staphylococcus species DETECTED (A) NOT DETECTED   Staphylococcus aureus (BCID) DETECTED (A) NOT DETECTED   Staphylococcus epidermidis NOT DETECTED NOT DETECTED   Staphylococcus lugdunensis NOT DETECTED NOT DETECTED   Streptococcus species NOT DETECTED NOT DETECTED   Streptococcus agalactiae NOT DETECTED NOT DETECTED   Streptococcus pneumoniae NOT DETECTED NOT DETECTED   Streptococcus pyogenes NOT DETECTED NOT DETECTED   A.calcoaceticus-baumannii NOT DETECTED NOT DETECTED   Bacteroides fragilis NOT DETECTED NOT DETECTED   Enterobacterales NOT DETECTED NOT DETECTED   Enterobacter cloacae complex NOT DETECTED NOT DETECTED   Escherichia coli NOT DETECTED NOT DETECTED   Klebsiella aerogenes NOT DETECTED NOT DETECTED   Klebsiella oxytoca NOT DETECTED NOT DETECTED   Klebsiella pneumoniae NOT DETECTED NOT DETECTED   Proteus species NOT DETECTED NOT DETECTED   Salmonella species NOT DETECTED NOT DETECTED   Serratia marcescens NOT DETECTED NOT DETECTED   Haemophilus influenzae NOT DETECTED NOT DETECTED   Neisseria meningitidis NOT DETECTED NOT  DETECTED   Pseudomonas aeruginosa NOT DETECTED NOT DETECTED   Stenotrophomonas maltophilia NOT DETECTED NOT DETECTED   Candida albicans NOT DETECTED NOT DETECTED   Candida auris NOT DETECTED NOT DETECTED   Candida glabrata NOT DETECTED NOT DETECTED   Candida krusei NOT DETECTED NOT DETECTED   Candida parapsilosis NOT DETECTED NOT DETECTED   Candida tropicalis NOT DETECTED NOT DETECTED   Cryptococcus neoformans/gattii NOT DETECTED NOT DETECTED   Meth resistant mecA/C and MREJ DETECTED (A) NOT DETECTED    Rakan Soffer D 07/16/2024  3:37 AM

## 2024-07-16 NOTE — Assessment & Plan Note (Signed)
 Likely secondary to infection.

## 2024-07-16 NOTE — Consult Note (Signed)
 NAME: Andrew Buchanan  DOB: 1971-02-01  MRN: [id number]  Date/Time: 07/16/2024 2:06 PM  REQUESTING PROVIDER: Dr.Wieting Subjective:  REASON FOR CONSULT: MRSA bacteremia ? Andrew Buchanan is a 53 y.o. with a history of, hypertension, diabetes, polysubstance use, bipolar disorder chronic foot ulcers, surgery to the rt foot in feb 2025 and had MRSA infection ,  presents with soreness of the right foot for the past 2 days.  Patient states that he had some issues with balance and  fell and hit his face and hurt his hand .  Did not lose consciousness.  Blood sugar has been high the past few days He was recently hospitalized 9-25 until 06/16/2024 for neck abscess and cellulitis Vitals in the ED BP of 145/94, temperature 98.1, pulse 93, respiratory 16 and sats of 100% WBC was 7.2, Hb 11.5, platelet 364 and creatinine of 1.26. Blood culture was sent Patient was started on broad-spectrum antibiotics with Vanco cefepime  and Flagyl  CT of the cervical spine showed no acute injury CT of the head was negative right foot x-ray showed surgical screw in the proximal fifth metatarsal bone and no acute bone abnormality.   Pt had received ativan  and librium  for agitation due to alcohol withdrawal overnight. He also was on Precedex  for a short period and that has been stopped I am asked to see the patient as he has MRSA in blood culture Pt states he drinks Andrew Buchanan every day and his sugar is high because of that   Past Medical History:  Diagnosis Date   AKI (acute kidney injury) 01/08/2021   Alcohol abuse    Anxiety    Cellulitis and abscess of neck 06/10/2024   Diabetes mellitus without complication (HCC)    Diabetic foot infection (HCC) 11/20/2023   DKA (diabetic ketoacidosis) (HCC) 05/19/2020   DKA, type 2 (HCC) 06/09/2024   History of MRSA infection 12/08/2023   11/2023 Diabetic foot wound requiring surgical debridement, cultures MRSA+     Hyperosmolar hyperglycemic state (HHS) (HCC)  01/05/2021   Hypertension    Pancreatitis    Sepsis with acute renal failure without septic shock (HCC)    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   Drug use: Yes    Types: Crack cocaine, Benzodiazepines, Cocaine   Sexual activity: Not on file  Other Topics Concern   Not on file  Social History Narrative   Not on file   Social Drivers of Health   Financial Resource Strain: Medium Risk (11/15/2022)   Received from [facility]   Overall Financial Resource Strain (CARDIA)    Difficulty of Paying Living Expenses: Somewhat hard  Food Insecurity: No Food Insecurity (06/11/2024)   Hunger Vital Sign    Worried About Running Out of Food in the Last  Year: Never true    Ran Out of Food in the Last Year: Never true  Recent Concern: Food Insecurity - Food Insecurity Present (03/23/2024)   Received from [facility]   Hunger Vital Sign    Within the past 12 months, you worried that your food would run out before you got the money to buy more.: Sometimes true    Within the past 12 months, the food you bought just didn't last and you didn't have money to get more.: Sometimes true  Transportation Needs: No Transportation Needs (06/11/2024)   PRAPARE - Administrator, Civil Service (Medical): No    Lack of Transportation (Non-Medical): No  Recent Concern: Transportation Needs - Unmet Transportation Needs  (03/23/2024)   Received from [facility] - Transportation    Lack of Transportation (Medical): Yes    Lack of Transportation (Non-Medical): Yes  Physical Activity: Inactive (11/15/2022)   Received from [facility]   Exercise Vital Sign    On average, how many days per week do you engage in moderate to strenuous exercise (like a brisk walk)?: 0 days    On average, how many minutes do you engage in exercise at this level?: 0 min  Stress: Stress Concern Present (11/15/2022)   Received from [facility] Occup[facility] - Occupational Stress Questionnaire    Feeling of Stress : Rather much  Social Connections: Socially Isolated (11/15/2022)   Received from [facility]   Social Connection and Isolation Panel    In a typical week, how many times do you talk on the phone with family, friends, or neighbors?: More than three times a week    How often do you get together with friends or relatives?: Twice a week    How often do you attend church or religious services?: Never    Do you belong to any clubs or organizations such as church groups, unions, fraternal or athletic groups, or school groups?: No    How often do you attend meetings of the clubs or organizations you belong to?: Never    Are you married, widowed, divorced, separated, never married, or living with a partner?: Never married  Intimate Partner Violence: Not At Risk (06/11/2024)   Humiliation, Afraid, Rape, and Kick questionnaire    Fear of Current or Ex-Partner: No    Emotionally Abused: No    Physically Abused: No    Sexually Abused: No    Family History  Problem Relation Age of Onset   Alcoholism Father    Allergies  Allergen Reactions   Amlodipine Shortness Of Breath   Antihistamines, Chlorpheniramine-Type Anaphylaxis   Statins Other (See Comments)    Pt declines   I? Current Facility-Administered Medications  Medication Dose Route Frequency Provider Last Rate Last Admin    oxyCODONE  (Oxy IR/ROXICODONE ) immediate release tablet 10 mg  10 mg Oral Q4H PRN Wieting, Richard, MD       And   acetaminophen  (TYLENOL ) tablet 325 mg  325 mg Oral Q4H PRN Josette Ade, MD   325 mg at 07/16/24 1341   acetaminophen  (TYLENOL ) tablet 650 mg  650 mg Oral Q6H PRN Josette Ade, MD   650 mg at 07/16/24 0920   chlordiazePOXIDE  (LIBRIUM ) capsule 25 mg  25 mg Oral TID Melvin, Alexander B, MD       Followed by   NOREEN ON 07/17/2024] chlordiazePOXIDE  (LIBRIUM ) capsule 25 mg  25 mg Oral Letha Scurry,  Marsa NOVAK, MD       Followed by   NOREEN ON 07/18/2024] chlordiazePOXIDE  (LIBRIUM ) capsule 25 mg  25 mg Oral Daily Melvin, Alexander B, MD       Chlorhexidine  Gluconate Cloth 2 % PADS 6 each  6 each Topical Daily Cleatus Delayne GAILS, MD   6 each at 07/16/24 9093   clonazePAM  (KLONOPIN ) tablet 1 mg  1 mg Oral BID PRN Melvin, Alexander B, MD   1 mg at 07/16/24 9082   dexmedetomidine  (PRECEDEX ) 400 MCG/100ML (4 mcg/mL) infusion  0-1.2 mcg/kg/hr Intravenous Titrated Cleatus Delayne GAILS, MD   Stopped at 07/16/24 9071   dextrose  50 % solution 0-50 mL  0-50 mL Intravenous PRN Melvin, Alexander B, MD       enalapril  (VASOTEC ) tablet 20 mg  20 mg Oral BID Melvin, Alexander B, MD   20 mg at 07/16/24 9062   enoxaparin  (LOVENOX ) injection 40 mg  40 mg Subcutaneous Q24H Melvin, Alexander B, MD   40 mg at 07/15/24 2208   folic acid  (FOLVITE ) tablet 1 mg  1 mg Oral Daily Melvin, Alexander B, MD   1 mg at 07/16/24 9093   gabapentin  (NEURONTIN ) capsule 600 mg  600 mg Oral BID Josette Ade, MD   600 mg at 07/16/24 1147   hydrOXYzine  (ATARAX ) tablet 25 mg  25 mg Oral Q6H PRN Seena Marsa NOVAK, MD       insulin  aspart (novoLOG ) injection 0-5 Units  0-5 Units Subcutaneous QHS Cleatus Delayne GAILS, MD       insulin  aspart (novoLOG ) injection 0-9 Units  0-9 Units Subcutaneous TID WC Duncan, Hazel V, MD   2 Units at 07/16/24 1146   insulin  glargine (LANTUS ) injection 8 Units  8 Units Subcutaneous Q24H Duncan,  Hazel V, MD   8 Units at 07/16/24 0100   loperamide (IMODIUM) capsule 2-4 mg  2-4 mg Oral PRN Seena Marsa NOVAK, MD       LORazepam  (ATIVAN ) tablet 1-4 mg  1-4 mg Oral Q1H PRN Duncan, Hazel V, MD       Or   LORazepam  (ATIVAN ) injection 1-4 mg  1-4 mg Intravenous Q1H PRN Duncan, Hazel V, MD   2 mg at 07/16/24 1257   multivitamin with minerals tablet 1 tablet  1 tablet Oral Daily Melvin, Alexander B, MD   1 tablet at 07/16/24 9094   mupirocin  ointment (BACTROBAN ) 2 % 1 Application  1 Application Nasal BID Cleatus Delayne GAILS, MD   1 Application at 07/16/24 0900   ondansetron  (ZOFRAN -ODT) disintegrating tablet 4 mg  4 mg Oral Q6H PRN Seena Marsa NOVAK, MD   4 mg at 07/16/24 1301   Oral care mouth rinse  15 mL Mouth Rinse PRN Cleatus Delayne GAILS, MD       thiamine  (VITAMIN B1) tablet 100 mg  100 mg Oral Daily Melvin, Alexander B, MD   100 mg at 07/16/24 9094   Or   thiamine  (VITAMIN B1) injection 100 mg  100 mg Intravenous Daily Melvin, Alexander B, MD   100 mg at 07/15/24 1303   vancomycin  (VANCOREADY) IVPB 750 mg/150 mL  750 mg Intravenous Q12H Zeigler, Dustin G, RPH         Abtx:  Anti-infectives (From admission, onward)    Start     Dose/Rate Route Frequency Ordered Stop   07/16/24 2200  vancomycin  (VANCOREADY) IVPB 750 mg/150 mL        750 mg 150 mL/hr over 60 Minutes Intravenous Every 12 hours 07/16/24  1220     07/16/24 0000  vancomycin  (VANCOCIN ) IVPB 1000 mg/200 mL premix  Status:  Discontinued        1,000 mg 200 mL/hr over 60 Minutes Intravenous Every 12 hours 07/15/24 1801 07/16/24 1220   07/15/24 2200  metroNIDAZOLE  (FLAGYL ) IVPB 500 mg  Status:  Discontinued        500 mg 100 mL/hr over 60 Minutes Intravenous Every 12 hours 07/15/24 1246 07/16/24 0908   07/15/24 1600  ceFEPIme  (MAXIPIME ) 2 g in sodium chloride  0.9 % 100 mL IVPB  Status:  Discontinued        2 g 200 mL/hr over 30 Minutes Intravenous Every 8 hours 07/15/24 1306 07/16/24 0908   07/15/24 1315  vancomycin  (VANCOREADY)  IVPB 500 mg/100 mL        500 mg 100 mL/hr over 60 Minutes Intravenous  Once 07/15/24 1304 07/15/24 1743   07/15/24 0930  ceFEPIme  (MAXIPIME ) 2 g in sodium chloride  0.9 % 100 mL IVPB        2 g 200 mL/hr over 30 Minutes Intravenous  Once 07/15/24 0928 07/15/24 1036   07/15/24 0930  metroNIDAZOLE  (FLAGYL ) IVPB 500 mg        500 mg 100 mL/hr over 60 Minutes Intravenous  Once 07/15/24 0928 07/15/24 1106   07/15/24 0930  vancomycin  (VANCOCIN ) IVPB 1000 mg/200 mL premix        1,000 mg 200 mL/hr over 60 Minutes Intravenous  Once 07/15/24 0928 07/15/24 1158       REVIEW OF SYSTEMS:  Const: negative fever, negative chills, negative weight loss Eyes: negative diplopia or visual changes, negative eye pain ENT: negative coryza, negative sore throat Resp: negative cough, hemoptysis, dyspnea Cards: negative for chest pain, palpitations, lower extremity edema GU: negative for frequency, dysuria and hematuria GI: Negative for abdominal pain, diarrhea, bleeding, constipation Skin: negative for rash and pruritus Heme: negative for easy bruising and gum/nose bleeding MS: fall, painful swelling rt ring finger Neurolo:some agitation Psych: anxiety, depression , alcohol use Endocrine:, diabetes non compliant with dietary restriction and excess alcohol use Allergy/Immunology- as above Objective:  VITALS:  BP 127/85   Pulse (!) 55   Temp 97.6 F (36.4 C) (Oral)   Resp 11   Ht 6' (1.829 m)   Wt 70.5 kg   SpO2 100%   BMI 21.08 kg/m   PHYSICAL EXAM:  General: Awake , responds to questions appropriately  , some restlessness   Head: Normocephalic, without obvious abnormality, atraumatic. Eyes: Conjunctivae clear, anicteric sclerae. Pupils are equal ENT cut over the nasal bridge Nares normal. No drainage or sinus tenderness. Lips, mucosa, and tongue normal. No Thrush Neck: Supple, symmetrical, no adenopathy, thyroid: non tender no carotid bruit and no JVD. Back: No CVA tenderness. Lungs:  b/l air entry Heart: s1s2 Abdomen: Soft, non-tender,not distended. Bowel sounds normal. No masses Extremities: rt hand- ring finger swollen, painful Rt foot  Skin: No rashes or lesions. Or bruising Lymph: Cervical, supraclavicular normal. Neurologic: Grossly non-focal Pertinent Labs Lab Results CBC    Component Value Date/Time   WBC 6.8 07/16/2024 0323   RBC 3.00 (L) 07/16/2024 0323   HGB 10.5 (L) 07/16/2024 0323   HGB 16.4 08/18/2014 1239   HCT 30.5 (L) 07/16/2024 0323   HCT 47.3 08/18/2014 1239   PLT 245 07/16/2024 0323   PLT 199 08/18/2014 1239   MCV 101.7 (H) 07/16/2024 0323   MCV 110 (H) 08/18/2014 1239   MCH 35.0 (H) 07/16/2024 0323   MCHC 34.4  07/16/2024 0323   RDW 12.4 07/16/2024 0323   RDW 14.5 08/18/2014 1239   LYMPHSABS 1.8 07/16/2024 0323   MONOABS 0.2 07/16/2024 0323   EOSABS 0.1 07/16/2024 0323   BASOSABS 0.0 07/16/2024 0323       Latest Ref Rng & Units 07/16/2024    3:23 AM 07/15/2024   11:53 PM 07/15/2024    6:54 PM  CMP  Glucose 70 - 99 mg/dL 99  834  738   BUN 6 - 20 mg/dL 12  13  12    Creatinine 0.61 - 1.24 mg/dL 8.87  8.94  9.09   Sodium 135 - 145 mmol/L 140  134  139   Potassium 3.5 - 5.1 mmol/L 4.3  4.3  4.6   Chloride 98 - 111 mmol/L 103  101  106   CO2 22 - 32 mmol/L 24  23  21    Calcium  8.9 - 10.3 mg/dL 8.1  7.5  7.9       Microbiology: Recent Results (from the past 240 hours)  Blood Culture (routine x 2)     Status: None (Preliminary result)   Collection Time: 07/15/24  8:13 AM   Specimen: BLOOD  Result Value Ref Range Status   Specimen Description BLOOD LEFT HAND  Final   Special Requests   Final    BOTTLES DRAWN AEROBIC AND ANAEROBIC Blood Culture results may not be optimal due to an inadequate volume of blood received in culture bottles   Culture   Final    NO GROWTH < 24 HOURS Performed at [facility], [address] [place], [place] [address]    Report Status PENDING  Incomplete  Blood Culture (routine x 2)      Status: None (Preliminary result)   Collection Time: 07/15/24  9:13 AM   Specimen: BLOOD  Result Value Ref Range Status   Specimen Description   Final    BLOOD LRFT [facility] Performed at [facility], [address] [place], [place] [address]    Special Requests   Final    BOTTLES DRAWN AEROBIC AND ANAEROBIC Blood Culture results may not be optimal due to an inadequate volume of blood received in culture bottles Performed at [facility], [address] [place], [place] [address]    Culture  Setup Time   Final    GRAM POSITIVE COCCI ANAEROBIC BOTTLE ONLY CRITICAL RESULT CALLED TO, READ BACK BY AND VERIFIED WITH:  JASON ROBBINS AT 0120 07/16/24 JG    Culture GRAM POSITIVE COCCI  Final   Report Status PENDING  Incomplete  Blood Culture ID Panel (Reflexed)     Status: Abnormal   Collection Time: 07/15/24  9:13 AM  Result Value Ref Range Status   Enterococcus faecalis NOT DETECTED NOT DETECTED Final   Enterococcus Faecium NOT DETECTED NOT DETECTED Final   Listeria monocytogenes NOT DETECTED NOT DETECTED Final   Staphylococcus species DETECTED (A) NOT DETECTED Final    Comment: CRITICAL RESULT CALLED TO, READ BACK BY AND VERIFIED WITH:  JASON ROBBINS AT 0120 07/16/24 JG    Staphylococcus aureus (BCID) DETECTED (A) NOT DETECTED Final    Comment: CRITICAL RESULT CALLED TO, READ BACK BY AND VERIFIED WITH:  JASON ROBBINS AT 0120 07/16/24 JG    Staphylococcus epidermidis NOT DETECTED NOT DETECTED Final   Staphylococcus lugdunensis NOT DETECTED NOT DETECTED Final   Streptococcus species NOT DETECTED NOT DETECTED Final   Streptococcus agalactiae NOT DETECTED NOT DETECTED Final   Streptococcus pneumoniae NOT DETECTED NOT  DETECTED Final   Streptococcus pyogenes NOT DETECTED NOT DETECTED Final   A.calcoaceticus-baumannii NOT DETECTED NOT DETECTED Final   Bacteroides fragilis NOT DETECTED NOT DETECTED Final   Enterobacterales NOT DETECTED NOT DETECTED Final   Enterobacter  cloacae complex NOT DETECTED NOT DETECTED Final   Escherichia coli NOT DETECTED NOT DETECTED Final   Klebsiella aerogenes NOT DETECTED NOT DETECTED Final   Klebsiella oxytoca NOT DETECTED NOT DETECTED Final   Klebsiella pneumoniae NOT DETECTED NOT DETECTED Final   Proteus species NOT DETECTED NOT DETECTED Final   Salmonella species NOT DETECTED NOT DETECTED Final   Serratia marcescens NOT DETECTED NOT DETECTED Final   Haemophilus influenzae NOT DETECTED NOT DETECTED Final   Neisseria meningitidis NOT DETECTED NOT DETECTED Final   Pseudomonas aeruginosa NOT DETECTED NOT DETECTED Final   Stenotrophomonas maltophilia NOT DETECTED NOT DETECTED Final   Candida albicans NOT DETECTED NOT DETECTED Final   Candida auris NOT DETECTED NOT DETECTED Final   Candida glabrata NOT DETECTED NOT DETECTED Final   Candida krusei NOT DETECTED NOT DETECTED Final   Candida parapsilosis NOT DETECTED NOT DETECTED Final   Candida tropicalis NOT DETECTED NOT DETECTED Final   Cryptococcus neoformans/gattii NOT DETECTED NOT DETECTED Final   Meth resistant mecA/C and MREJ DETECTED (A) NOT DETECTED Final    Comment: CRITICAL RESULT CALLED TO, READ BACK BY AND VERIFIED WITH:  JASON ROBBINS AT 0120 07/16/24 JG Performed at [facility], [address] [place], [place] [address]   MRSA Next Gen by PCR, Nasal     Status: Abnormal   Collection Time: 07/15/24  4:20 PM   Specimen: Nasal Mucosa; Nasal Swab  Result Value Ref Range Status   MRSA by PCR Next Gen DETECTED (A) NOT DETECTED Final    Comment: RESULT CALLED TO, READ BACK BY AND VERIFIED WITH: EBONI COOPER 2054 07/15/24 MU (NOTE) The GeneXpert MRSA Assay (FDA approved for NASAL specimens only), is one component of a comprehensive MRSA colonization surveillance program. It is not intended to diagnose MRSA infection nor to guide or monitor treatment for MRSA infections. Test performance is not FDA approved in patients less than 92 years old. Performed  at [facility], [address] [place], [place] [address]     IMAGING RESULTS: No acute bony abnormality rt foot Screw in the  proximal 5th meta tarsal I have personally reviewed the films ? CT head no acute abnormality  Impression/Recommendation  Poorly controlled DM Severe Hyperglycemia due to non compliance Management as per primary team IV fluids, insulin   Dm with neuropathy  Diabetic foot ulcer rt- chronic  MRSA bacteremia secondary to the foot ulcer which is chronic' will get cultre from the ulcer Need MRI of the foot once stable Continue vanco Dc cefepime  and flagyl  Need 2 d echo Will need TEE  AKI- secondary to dehydration from above Resolved with fluids  Transaminitis due to alcohol     Falls - likely due to alcohol use Rt  ring finger swollen Cut bridge of the nose Need imaging to r/o fracture  Alcohol use with confusion and agitation thought to be due to withdrawal- stable now  Anemia  Past h/o rt foot surgery MRSA in the wound culture feb 2025  This consult involved complex antimicrobial management ? ? _I have personally spent  -70--minutes involved in face-to-face and non-face-to-face activities for this patient on the day of the visit. Professional time spent includes the following activities: Preparing to see the patient (review of tests), Obtaining and/or  reviewing separately obtained history (admission/discharge record), Performing a medically appropriate examination and/or evaluation , Ordering medications/tests/procedures, referring and communicating with other health care professionals, Documenting clinical information in the EMR, Independently interpreting results (not separately reported), Communicating results to the patient/Counseling and educating the patient/r and Care coordination (not separately reported).    ________________________________________________ Discussed with requesting provider Note:  This document was  prepared using Dragon voice recognition software and may include unintentional dictation errors.

## 2024-07-16 NOTE — Assessment & Plan Note (Signed)
 Urine toxicology positive for benzodiazepine and opiates.

## 2024-07-16 NOTE — Assessment & Plan Note (Addendum)
 Seen by psychiatry.  Under IVC.

## 2024-07-16 NOTE — Assessment & Plan Note (Addendum)
 Patient on Percocet 10/325 and gabapentin .  I did not prescribe any pain medications for him.

## 2024-07-16 NOTE — Assessment & Plan Note (Addendum)
 Placed on alcohol withdrawal protocol.  Will complete Librium  taper.

## 2024-07-16 NOTE — Hospital Course (Addendum)
 53 y.o. male with medical history significant of hypertension, diabetes, HHS, foot ulcers, substance use, alcohol use, medication nonadherence, anxiety, bipolar disorder presenting with hyperglycemia and foot pain.   Patient reports 1 to 2 days of soreness at his right foot.  Has known ulcers on his feet with history of diabetes.  States he had some issues with his balance due to some of his foot pain as well as some dizziness and he had a fall where he hit his face yesterday.  Did not lose consciousness.   Today he notes his blood sugar was reading greater than 900 .  Reports he did try taking several doses of insulin  prior to coming to the ED.   Denies fevers, chills, chest pain, shortness of breath, abdominal pain, constipation, diarrhea, nausea, vomiting. Initially denied recent alcohol use in the ED but lab work showed ethanol level of 326 and patient reports drinking alcohol to me but denies any other illicit/nonprescribed substance use.   ED Course: Vital signs in the ED notable for blood pressure in the 130s-150 systolic.  Lab workup included CMP with potassium 4.5, creatinine stable at 1.36, glucose 550, calcium  8.3, albumin 3.2, AST 355, ALT 57.  CBC with hemoglobin stable 11.5.  PT 16.2, INR normal.  Lactic acid 6.7, repeat pending.  Lipase pending.  Procalcitonin pending.  Ammonia level mildly elevated at 52.  Ethanol level 326.  UDS positive for opiates and benzos.  Urinalysis with glucose only.  Blood cultures pending.  VBG with normal pH and normal pCO2.  Beta hydroxybutyric acid normal.   Patient declined x-ray of his foot.  CT head and CT C-spine showed no acute abnormality but did demonstrate stable chronic C6 fractures.   Patient received oxycodone , Ativan  x 4, vancomycin , cefepime , Flagyl  in the ED as well as 1.5 L of IV fluids.   Patient initially significantly agitated and had to have male nurse due to abusive language and behaviors.  ICU was consulted for possible Precedex   however patient calmed down by the time he was evaluated and was deemed stable to be admitted to hospitalist.  Will continue close monitoring in stepdown for now.  Evaluated by psychiatry who agreed patient did not have capacity.  Patient has been IVC'd and will be reevaluated by psychiatry as intoxication improves.  10/9.  Overnight needed to be placed on a Precedex  drip.  Precedex  discontinued today with pulse being low.  Continue to monitor mental state.  Positive blood culture with Staphylococcus aureus.  Patient has foot infection.  Continue vancomycin .  Will get ID consultation and podiatry consultation.  MRI of the foot ordered. 10/10.  Patient required Precedex  drip overnight.  Hopefully can taper off again today.  MRI of the foot negative for osteomyelitis. 10/11.  Patient complaining of right knee pain will get an MRI of the right knee.  Repeat blood cultures drawn this morning. 10/12.  Psychiatry team discontinued involuntary commitment.  Patient wanted to leave.  Repeat blood cultures negative for 2 days.  Patient given a dose of daptomycin before going.  Dr. [name] will set him up for dalbavancin weekly infusions.

## 2024-07-16 NOTE — Assessment & Plan Note (Addendum)
 On clonazepam  3 times daily

## 2024-07-16 NOTE — Progress Notes (Addendum)
 Pharmacy Antibiotic Note  Andrew Buchanan is a 53 y.o. male admitted on 07/15/2024 with HHS and Diabetic foot wound. Pharmacy has been consulted for Vancomycin  and Cefepime  dosing. Patient now with MRSA in blood culture.  He has PMH of a plantar R foot wound and underwent I&D by podiatry on 11/21/2023 with OR culture growing MRSA.  Today, 07/16/2024 Day #2 antibiotics - vancomycin   Renal: SCr 1.12 WBC WNL Afebrile 10/8 blood culture: 1/4 GPC, BCID MRSA  Vancomycin  levels:  Referring to [facility] records in 11/2023, patient was on vancomycin  750mg  IV q12h with a trough = 16.4 mcg/ml with similar weight (75kg) and SCr (1.18) at that time  Plan: Based on vancomycin  dosing and levels at [facility] in February, adjust vancomycin  to 750mg  IV q12h Will monitoring renal function and consider vancomyin levels in 3-5 days  ID consulted   Height: 6' (182.9 cm) Weight: 70.5 kg (155 lb 6.8 oz) IBW/kg (Calculated) : 77.6  Temp (24hrs), Avg:97.9 F (36.6 C), Min:97.6 F (36.4 C), Max:98.2 F (36.8 C)  Recent Labs  Lab 07/15/24 0913 07/15/24 0922 07/15/24 1330 07/15/24 1854 07/15/24 2353 07/16/24 0323  WBC 7.2  --   --   --   --  6.8  CREATININE  --  1.26* 0.95 0.90 1.05 1.12  LATICACIDVEN  --  6.7* 5.8* 7.3* 4.0* 2.3*    Estimated Creatinine Clearance: 76.1 mL/min (by C-G formula based on SCr of 1.12 mg/dL).    Allergies  Allergen Reactions   Amlodipine Shortness Of Breath   Antihistamines, Chlorpheniramine-Type Anaphylaxis   Statins Other (See Comments)    Pt declines    Antimicrobials this admission: 10/8 Cefepime  >>  10/8 Vancomycin   >>  10/8 Flagyl >>  Thank you for allowing pharmacy to be a part of this patient's care.  Rasheema Truluck, PharmD, BCPS, BCIDP Work Cell: [phone] 07/16/2024 12:20 PM

## 2024-07-16 NOTE — Progress Notes (Signed)
 Pt to MRI with sitter and security. Okay to go off monitor per Dr Josette.

## 2024-07-16 NOTE — Assessment & Plan Note (Addendum)
 1 blood culture positive for Staph aureus.  Likely secondary to foot infection.  MRI of the right foot negative for osteomyelitis.  Echocardiogram negative for endocarditis.  Patient refused TEE.  Appreciate ID and podiatry consultations.  On vancomycin .  Repeat blood cultures this morning.  Patient states he needs to get out of the hospital soon as possible.  I told him we are treating an infection that needs IV antibiotics.  He will need to be patient with us  until we set up a plan.

## 2024-07-16 NOTE — Consult Note (Addendum)
 WOC Nurse Consult Note: Reason for Consult: Consult requested for right foot.  Performed remotely after review of progress notes and photos in the EMR.  Pt is familiar to [facility] team from recent admission on 9/3.  Pt had chronic wounds and has been followed at [facility] as an outpatient.  Right plantar foot with full thickness wound, red and moist.  Topical treatment orders provided for bedside nurses to perform as follows to absorb drainage and provide antimicrobial benefits: Apply a piece of Aquacel Soila # [id number]) to right foot Q day, then cover with foam dressing.  Change foam dressing Q 3 days or PRN soiling.  Moisten previous Aquacel to assist with removal each time.  Pt should follow-up with [facility] after discharge.   Please re-consult if further assistance is needed.  Thank-you,  Stephane Fought MSN, RN, CWOCN, CWCN-AP, CNS Contact Mon-Fri 0700-1500: [phone]

## 2024-07-16 NOTE — Care Plan (Addendum)
   BRIEF PCCM NOTE  BRIEF PT DESCRIPTION / SYNOPSIS :  53 y.o. male with PMHx significant for ETOH abuse, polysubstance abuse, anxiety, bipolar disorder, diabetes with medication noncompliance, and HTN who is admitted with MRSA Bacteremia due to Diabetic Foot Wound, HHS, and acute metabolic encephalopathy and acute alcohol intoxication.   Course complicated by developing alcohol withdrawal briefly requiring Precedex .   SUBJECTIVE / INTERVAL HISTORY :  -Overnight pt became agitated despite multiple doses of IV Ativan  and PO Librium  ~ was placed on Precedex  -PCCM was consulted to assist with Precedex  management  -On initial exam, pt heavily sedated on 0.4 mcg of Precedex  ~ weaned off later in the morning and pt remains calm and cooperative with prn Ativan  and Librium  taper  -Blood cultures resulted with MRSA, suspect due to Diabetic foot wound ~ ID and Podiatry consulted  -Hemodynamically stable, not requiring vasopressors  -PCCM will SIGN OFF at this time ~ however should his withdrawals worsen, please do not hesitate to re-consult   OBJECTIVE :   Today's Vitals   07/16/24 1100 07/16/24 1115 07/16/24 1124 07/16/24 1257  BP: (!) 155/96  (!) 155/96 127/85  Pulse: (!) 54 65    Resp: 11 11    Temp:      TempSrc:      SpO2: 100% 100%    Weight:      Height:      PainSc:       Body mass index is 21.08 kg/m.   ASSESSMENT / PLAN :    #Acute Metabolic Encephalopathy #Acute Alcohol Intoxication  #Developing Alcohol Withdrawal  PMHx: Bipolar disorder, anxiety -Treatment of metabolic derangements as outlined above -Provide supportive care -Promote normal sleep/wake cycle and family presence -Avoid sedating medications as able -CIWA Protocol  -Continue Librium  taper -Precedex  weaned off -If with worsening withdrawals, would bolus with Phenobarb and start taper  -Continue Thiamine , Folic acid , MVI  #MRSA Bacteremia #Infected Diabetic Foot Wound -Monitor fever curve -Trend WBC's  & Procalcitonin -Follow cultures as above -ID consulted, appreciate input ~ Continue empiric Vancomycin  pending cultures & sensitivities -Podiatry consulted, appreciate input   #HHS ~ RESOLVED #Diabetes Mellitus -CBG's ac & hs; Target range of 140 to 180 -SSI -Follow ICU Hypo/Hyperglycemia protocol         Inge Lecher, AGACNP-BC [facility] Prefer epic messenger for cross cover needs If after hours, please call E-link

## 2024-07-17 ENCOUNTER — Inpatient Hospital Stay
Admit: 2024-07-17 | Discharge: 2024-07-17 | Disposition: A | Discharge status: Home / Self Care | Attending: Internal Medicine | Admitting: Internal Medicine

## 2024-07-17 DIAGNOSIS — E11628 Type 2 diabetes mellitus with other skin complications: Secondary | ICD-10-CM | POA: Diagnosis not present

## 2024-07-17 DIAGNOSIS — R7881 Bacteremia: Secondary | ICD-10-CM | POA: Diagnosis not present

## 2024-07-17 DIAGNOSIS — F10121 Alcohol abuse with intoxication delirium: Secondary | ICD-10-CM | POA: Diagnosis not present

## 2024-07-17 DIAGNOSIS — S0031XA Abrasion of nose, initial encounter: Principal | ICD-10-CM

## 2024-07-17 DIAGNOSIS — E1151 Type 2 diabetes mellitus with diabetic peripheral angiopathy without gangrene: Secondary | ICD-10-CM

## 2024-07-17 DIAGNOSIS — E11621 Type 2 diabetes mellitus with foot ulcer: Secondary | ICD-10-CM

## 2024-07-17 DIAGNOSIS — E872 Acidosis, unspecified: Secondary | ICD-10-CM | POA: Diagnosis not present

## 2024-07-17 LAB — BASIC METABOLIC PANEL WITH GFR
Anion gap: 10 (ref 5–15)
BUN: 10 mg/dL (ref 6–20)
CO2: 25 mmol/L (ref 22–32)
Calcium: 7.8 mg/dL — ABNORMAL LOW (ref 8.9–10.3)
Chloride: 100 mmol/L (ref 98–111)
Creatinine, Ser: 1.13 mg/dL (ref 0.61–1.24)
GFR, Estimated: >60 mL/min (ref >60)
Glucose, Bld: 310 mg/dL — ABNORMAL HIGH (ref 70–99)
Potassium: 4.3 mmol/L (ref 3.5–5.1)
Sodium: 135 mmol/L (ref 135–145)

## 2024-07-17 LAB — GLUCOSE, CAPILLARY
Glucose-Capillary: 340 mg/dL — ABNORMAL HIGH (ref 70–99)
Glucose-Capillary: 75 mg/dL (ref 70–99)
Glucose-Capillary: 306 mg/dL — ABNORMAL HIGH (ref 70–99)
Glucose-Capillary: 332 mg/dL — ABNORMAL HIGH (ref 70–99)
Glucose-Capillary: 275 mg/dL — ABNORMAL HIGH (ref 70–99)

## 2024-07-17 LAB — ECHOCARDIOGRAM COMPLETE
AR max vel: 3.31 cm2
AV Area VTI: 4.19 cm2
AV Area mean vel: 3.63 cm2
AV Mean grad: 1 mmHg
AV Peak grad: 2.2 mmHg
Ao pk vel: 0.75 m/s
Area-P 1/2: 1.69 cm2
Height: 72 in
MV VTI: 2.39 cm2
S' Lateral: 2.34 cm
Weight: 2486.79 [oz_av]

## 2024-07-17 LAB — HEMOGLOBIN: Hemoglobin: 10.2 g/dL — ABNORMAL LOW (ref 13.0–17.0)

## 2024-07-17 LAB — LACTIC ACID, PLASMA: Lactic Acid, Venous: 1.7 mmol/L (ref 0.5–1.9)

## 2024-07-17 MED ORDER — STERILE WATER FOR INJECTION IJ SOLN
INTRAMUSCULAR | Status: AC
  Filled 2024-07-17: qty 10

## 2024-07-17 MED ORDER — QUETIAPINE FUMARATE 25 MG PO TABS
12.5000 mg | ORAL_TABLET | Freq: Every day | ORAL | Status: DC
  Administered 2024-07-17 – 2024-07-19 (×3): 12.5 mg via ORAL
  Filled 2024-07-17 (×3): qty 1

## 2024-07-17 MED ORDER — INSULIN GLARGINE 100 UNIT/ML ~~LOC~~ SOLN
10.0000 [IU] | Freq: Every day | SUBCUTANEOUS | Status: DC
  Administered 2024-07-17: 10 [IU] via SUBCUTANEOUS
  Filled 2024-07-17 (×2): qty 0.1

## 2024-07-17 MED ORDER — DEXMEDETOMIDINE HCL IN NACL 400 MCG/100ML IV SOLN
0.0000 ug/kg/h | INTRAVENOUS | Status: DC
  Administered 2024-07-17 (×2): 0.4 ug/kg/h via INTRAVENOUS
  Filled 2024-07-17 (×2): qty 100

## 2024-07-17 NOTE — Progress Notes (Signed)
 MD has been notified: FYI, precedex  is currently off. the patient had some soft BP episodes 79/48 (MAP 59), P 70, BP 68/58 (MAP 67), Manual BP 84/64. This RN provided the patient with 2 cups of water to hydrate himself. Current BP is 148/92 (MAP 109), P 56.

## 2024-07-17 NOTE — Inpatient Diabetes Management (Signed)
 Inpatient Diabetes Program Recommendations  AACE/ADA: New Consensus Statement on Inpatient Glycemic Control (2015)  Target Ranges:  Prepandial:   less than 140 mg/dL      Peak postprandial:   less than 180 mg/dL (1-2 hours)      Critically ill patients:  140 - 180 mg/dL    Latest Reference Range & Units 07/16/24 01:54 07/16/24 03:04 07/16/24 07:07 07/16/24 11:04 07/16/24 16:19 07/16/24 21:21 07/17/24 07:41  Glucose-Capillary 70 - 99 mg/dL 866 (H) 899 (H) 884 (H) 165 (H) 176 (H) 163 (H) 340 (H)    Review of Glycemic Control  Diabetes history: DM2 Outpatient Diabetes medications: Humulin  70/30 18 units BID, Actos  15 mg daily, Dexcom G7 Current orders for Inpatient glycemic control: Lantus  8 units daily, Novolog  0-9 units TID with meals, Novolog  0-5 units QHS  Inpatient Diabetes Program Recommendations:    Insulin :CBGs 100-176 mg/dl on 89/0/74 and 659 mg/dl at 2:58 am today.   Please consider increasing Lantus  to 10 units daily and change CBGs and Novolog  correction to 0-9 units Q4H.  NOTE: Signing off consult; please reconsult inpatient diabetes coordinator if needed.  Thanks, Earnie Gainer, RN, MSN, CDCES Diabetes Coordinator Inpatient Diabetes Program [phone] (Team Pager from 8am to 5pm)

## 2024-07-17 NOTE — Progress Notes (Signed)
 MRI was negative for abscess or osteomyelitis.  At this point ulceration is superficial.  Will recommend continued local wound care.  Should be nonweightbearing to his right foot.  He can follow-up with his primary podiatrist or wound care specialist upon discharge.  Podiatry to sign off for now.  Please reconsult if there are any future concerns.

## 2024-07-17 NOTE — Plan of Care (Signed)
  Problem: Health Behavior/Discharge Planning: Goal: Ability to identify and utilize available resources and services will improve Outcome: Not Progressing Goal: Ability to manage health-related needs will improve Outcome: Not Progressing   Problem: Metabolic: Goal: Ability to maintain appropriate glucose levels will improve Outcome: Progressing   Problem: Nutritional: Goal: Maintenance of adequate nutrition will improve Outcome: Progressing   Problem: Skin Integrity: Goal: Risk for impaired skin integrity will decrease Outcome: Progressing   Problem: Education: Goal: Ability to describe self-care measures that may prevent or decrease complications (Diabetes Survival Skills Education) will improve Outcome: Not Progressing   Problem: Cardiac: Goal: Ability to maintain an adequate cardiac output will improve Outcome: Progressing

## 2024-07-17 NOTE — Progress Notes (Signed)
*  PRELIMINARY RESULTS* Echocardiogram 2D Echocardiogram has been performed.  Andrew Buchanan 07/17/2024, 11:41 AM

## 2024-07-17 NOTE — Progress Notes (Signed)
 Date of Admission:  07/15/2024    ID: Andrew Buchanan is a 53 y.o. 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)   Hyperosmolar hyperglycemic state (HHS) (HCC)   HHS (hypothenar hammer syndrome)   Lactic acidosis   Chronic pain syndrome    Subjective: Sitting comfortably in bed No specific complaints Asking for anxiety medications Medications:   chlordiazePOXIDE   25 mg Oral BH-qamhs   Followed by   NOREEN ON 07/18/2024] chlordiazePOXIDE   25 mg Oral Daily   Chlorhexidine  Gluconate Cloth  6 each Topical Daily   enalapril   20 mg Oral BID   enoxaparin  (LOVENOX ) injection  40 mg Subcutaneous Q24H   folic acid   1 mg Oral Daily   gabapentin   600 mg Oral BID   insulin  aspart  0-5 Units Subcutaneous QHS   insulin  aspart  0-9 Units Subcutaneous TID WC   insulin  glargine  8 Units Subcutaneous Q24H   multivitamin with minerals  1 tablet Oral Daily   mupirocin  ointment  1 Application Nasal BID   thiamine   100 mg Oral Daily   Or   thiamine   100 mg Intravenous Daily    Objective: Vital signs in last 24 hours: Patient Vitals for the past 24 hrs:  BP Temp Temp src Pulse Resp SpO2  07/17/24 1015 -- -- -- 66 15 94 %  07/17/24 1000 (!) 153/95 -- -- 67 14 95 %  07/17/24 0945 -- -- -- 60 13 94 %  07/17/24 0930 (!) 156/94 -- -- 62 14 94 %  07/17/24 0915 -- -- -- 73 (!) 22 98 %  07/17/24 0900 106/74 -- -- -- 14 --  07/17/24 0845 -- -- -- 77 13 96 %  07/17/24 0835 (!) 150/95 -- -- -- -- --  07/17/24 0830 (!) 150/95 -- -- 63 14 94 %  07/17/24 0815 -- -- -- 69 17 96 %  07/17/24 0800 (!) 148/96 98.6 F (37 C) Axillary 65 19 96 %  07/17/24 0745 -- -- -- 65 19 95 %  07/17/24 0730 (!) 151/95  -- -- 63 19 96 %  07/17/24 0715 -- -- -- 60 19 96 %  07/17/24 0700 (!) 157/100 -- -- 60 19 95 %  07/17/24 0600 (!) 121/94 -- -- -- (!) 23 --  07/17/24 0500 123/87 -- -- -- 16 --  07/17/24 0430 (!) 143/92 98 F (36.7 C) -- (!) 59 14 97 %  07/17/24 0406 (!) 143/92 -- -- 62 -- --  07/17/24 0300 113/85 -- -- 83 14 95 %  07/17/24 0200 (!) 145/107 -- -- 95 13 100 %  07/17/24 0109 (!) 164/96 -- -- 70 -- --  07/17/24 0102 (!) 164/96 -- -- 72 11 100 %  07/17/24 0000 (!) 142/97 98 F (36.7 C) -- 68 11 100 %  07/16/24 2308 (!) 170/86 -- -- 80 -- --  07/16/24 2300 (!) 158/98 -- -- 78 -- 100 %  07/16/24 2200 (!) 142/86 -- -- 73 -- 100 %  07/16/24 2122 (!) 152/84 -- -- 66 15 95 %  07/16/24 2000 (!) 158/94 (!) 97.3 F (36.3 C) -- 94 -- 99 %  07/16/24 1915 -- -- -- 62 -- 100 %  07/16/24 1900 126/74 -- -- 72 -- 99 %  07/16/24 1845 -- -- -- 73 17 100 %  07/16/24 1830 -- -- -- -- 16 --  07/16/24 1815 -- -- -- 80 (!) 30 100 %  07/16/24 1800 (!) 157/92 -- -- 73 18 100 %  07/16/24 1519 (!) 117/92 (!) 97 F (36.1 C) Axillary -- -- --  07/16/24 1504 (!) 117/92 -- -- (!) 55 -- --  07/16/24 1315 -- -- -- (!) 55 11 100 %  07/16/24 1300 127/85 -- -- (!) 132 15 95 %  07/16/24 1257 127/85 97.6 F (36.4 C) Oral -- -- --      PHYSICAL EXAM:  General: Alert, cooperative, no distress, appears stated age.  Head: Normocephalic, without obvious abnormality, atraumatic. Eyes: Conjunctivae clear, anicteric sclerae. Pupils are equal ENT Nares normal. No drainage or sinus tenderness. Lips, mucosa, and tongue normal. No Thrush Neck: Supple, symmetrical, no adenopathy, thyroid: non tender no carotid bruit and no JVD. Back: No CVA tenderness. Lungs: Clear to auscultation bilaterally. No Wheezing or Rhonchi. No rales. Heart: Regular rate and rhythm, no murmur, rub or gallop. Abdomen: Soft, non-tender,not distended. Bowel sounds normal. No masses Right ring finger swollen and tender Extremities: Right foot  plantar aspect superficial ulcer present    Skin: No rashes or lesions. Or bruising Lymph: Cervical, supraclavicular normal. Neurologic: Grossly non-focal  Lab Results    Latest Ref Rng & Units 07/17/2024    7:20 AM 07/16/2024    3:23 AM 07/15/2024    9:13 AM  CBC  WBC 4.0 - 10.5 K/uL  6.8  7.2   Hemoglobin 13.0 - 17.0 g/dL 89.7  89.4  88.4   Hematocrit 39.0 - 52.0 %  30.5  34.4   Platelets 150 - 400 K/uL  245  364        Latest Ref Rng & Units 07/17/2024    7:20 AM 07/16/2024    3:23 AM 07/15/2024   11:53 PM  CMP  Glucose 70 - 99 mg/dL 689  99  834   BUN 6 - 20 mg/dL 10  12  13    Creatinine 0.61 - 1.24 mg/dL 8.86  8.87  8.94   Sodium 135 - 145 mmol/L 135  140  134   Potassium 3.5 - 5.1 mmol/L 4.3  4.3  4.3   Chloride 98 - 111 mmol/L 100  103  101   CO2 22 - 32 mmol/L 25  24  23    Calcium  8.9 - 10.3 mg/dL 7.8  8.1  7.5       Microbiology: N/A 25 Blood culture 1 set out of 2 has MRSA 07/16/2024 right foot ulcer culture pending  Studies/Results: MR FOOT RIGHT WO CONTRAST Result Date: 07/17/2024 CLINICAL DATA:  Diabetic foot swelling EXAM: MRI OF THE RIGHT FOREFOOT WITHOUT CONTRAST TECHNIQUE: Multiplanar, multisequence MR imaging of the right foot from the midfoot through the toes was performed. No intravenous contrast was administered. COMPARISON:  Radiographs 07/15/2024 FINDINGS: Bones/Joint/Cartilage Metal artifact from axial lag screw in the fifth metatarsal obscuring the surrounding tissues. No substantial marrow edema to suggest osteomyelitis identified. Mild degenerative spurring along portions of the midfoot and Lisfranc joint. Small well corticated ossicle below the medial malleolus. Ligaments Lisfranc ligament intact. Muscles and Tendons Mild regional muscular atrophy with some accentuated T2 signal throughout the regional musculature, probably neurogenic. 3.2 cm discontinuous segment of the medial band of the plantar fascia as on image 24 series 8, possibly from prior  tear, plantar fascia  release, or focal weakening of the plantar fascia related to prior infection. There is underlying effacement of the subcutaneous adipose tissue and possible underlying wound. Soft tissues As noted above, suspected plantar wounds at the midfoot level with likely components of ulceration but no drainable abscess is observed. IMPRESSION: 1. No findings of osteomyelitis. 2. 3.2 cm discontinuous segment of the medial band of the plantar fascia at the midfoot level, possibly from prior tear, plantar fascia release, or focal weakening of the plantar fascia related to prior infection. There is underlying effacement of the subcutaneous adipose tissue and possible underlying wound. 3. Mild regional muscular atrophy with some accentuated T2 signal throughout the regional musculature, probably neurogenic. 4. Mild degenerative spurring along portions of the midfoot and Lisfranc joint. Electronically Signed   By: Ryan Salvage M.D.   On: 07/17/2024 08:35   DG Foot Complete Right Result Date: 07/15/2024 CLINICAL DATA:  Diabetic foot infection.  Foot ulcer. EXAM: RIGHT FOOT COMPLETE - 3+ VIEW COMPARISON:  07/30/2007 FINDINGS: Chronic surgical screw in the proximal fifth metatarsal bone. Evidence for old fracture with cortical thickening in the mid fourth metatarsal bone. Extension of the toes on these radiographs. Negative for an acute fracture or dislocation. No evidence for cortical destruction or periosteal reaction. Soft tissue fullness or swelling along the plantar aspect of the foot. IMPRESSION: 1. No acute bone abnormality in the right foot. 2. Surgical screw in the proximal fifth metatarsal bone. 3. Soft tissue fullness or swelling along the plantar aspect of the foot. Electronically Signed   By: Juliene Balder M.D.   On: 07/15/2024 13:41     Assessment/Plan: MRSA bacteremia 1 set of 2 Likely source is the foot and ulcer Right foot ulcer with underlying diabetes mellitus with peripheral  neuropathy MRI of the foot shows no findings of osteomyelitis. Patient is currently on vancomycin  Repeat blood cultures needed 2D echo has been done is a technically difficult study due to poor echo windows The aortic valve was not well-visualized Tricuspid valve was not visualized the mitral valve was grossly normal Patient will need a TEE Depending on the TEE results can decide on the duration of antibiotic.  Diabetes mellitus with peripheral neuropathy Poorly controlled due to patient's noncompliance with food and alcohol  EtOH abuse  Falls due to EtOH abuse Right ring finger swollen need to rule out fracture x-ray needed  Delirium tremens and alcohol withdrawal is resolved  Anemia  Past history of right foot surgery and MRSA in the wound culture before Discussed the management of the patient in detail ID will not follow him routinely this weekend On-call physician available by phone for urgent issues only.  Call if needed

## 2024-07-17 NOTE — Progress Notes (Signed)
 Progress Note   Patient: Andrew Buchanan FMW:[id number] DOB: [birth year] DOA: 07/15/2024     1 DOS: the patient was seen and examined on 07/17/2024   Brief hospital course: 53 y.o. male with medical history significant of hypertension, diabetes, HHS, foot ulcers, substance use, alcohol use, medication nonadherence, anxiety, bipolar disorder presenting with hyperglycemia and foot pain.   Patient reports 1 to 2 days of soreness at his right foot.  Has known ulcers on his feet with history of diabetes.  States he had some issues with his balance due to some of his foot pain as well as some dizziness and he had a fall where he hit his face yesterday.  Did not lose consciousness.   Today he notes his blood sugar was reading greater than 900 .  Reports he did try taking several doses of insulin  prior to coming to the ED.   Denies fevers, chills, chest pain, shortness of breath, abdominal pain, constipation, diarrhea, nausea, vomiting. Initially denied recent alcohol use in the ED but lab work showed ethanol level of 326 and patient reports drinking alcohol to me but denies any other illicit/nonprescribed substance use.   ED Course: Vital signs in the ED notable for blood pressure in the 130s-150 systolic.  Lab workup included CMP with potassium 4.5, creatinine stable at 1.36, glucose 550, calcium  8.3, albumin 3.2, AST 355, ALT 57.  CBC with hemoglobin stable 11.5.  PT 16.2, INR normal.  Lactic acid 6.7, repeat pending.  Lipase pending.  Procalcitonin pending.  Ammonia level mildly elevated at 52.  Ethanol level 326.  UDS positive for opiates and benzos.  Urinalysis with glucose only.  Blood cultures pending.  VBG with normal pH and normal pCO2.  Beta hydroxybutyric acid normal.   Patient declined x-ray of his foot.  CT head and CT C-spine showed no acute abnormality but did demonstrate stable chronic C6 fractures.   Patient received oxycodone , Ativan  x 4, vancomycin , cefepime , Flagyl  in the ED as  well as 1.5 L of IV fluids.   Patient initially significantly agitated and had to have male nurse due to abusive language and behaviors.  ICU was consulted for possible Precedex  however patient calmed down by the time he was evaluated and was deemed stable to be admitted to hospitalist.  Will continue close monitoring in stepdown for now.  Evaluated by psychiatry who agreed patient did not have capacity.  Patient has been IVC'd and will be reevaluated by psychiatry as intoxication improves.  10/9.  Overnight needed to be placed on a Precedex  drip.  Precedex  discontinued today with pulse being low.  Continue to monitor mental state.  Positive blood culture with Staphylococcus aureus.  Patient has foot infection.  Continue vancomycin .  Will get ID consultation and podiatry consultation.  MRI of the foot ordered. 10/10.  Patient required Precedex  drip overnight.  Hopefully can taper off again today.  MRI of the foot negative for osteomyelitis.  Assessment and Plan: * MRSA bacteremia 1 blood culture positive for Staph aureus.  Likely secondary to foot infection.  MRI of the right foot negative for osteomyelitis.  Echocardiogram pending read.  Appreciate ID and podiatry consultations.  On vancomycin .  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  Patient was placed on insulin  drip.  Increase long-acting insulin  to 10 units.  Continue sliding scale insulin   Alcohol intoxication delirium Placed on alcohol withdrawal  protocol.  Overnight had to be placed on Precedex  drip again.  Anxiety On clonazepam  as needed  Chronic pain syndrome Patient on Percocet 07/10/2024 and gabapentin   Polysubstance abuse (HCC) Urine toxicology positive for benzodiazepine and opiates.  Bipolar disorder, unspecified [facility]) Psychiatry following        Subjective: Patient feeling okay.   Admitted with high sugar and foot infection found to have positive Staph aureus in the blood culture  Physical Exam: Vitals:   07/17/24 0930 07/17/24 0945 07/17/24 1000 07/17/24 1015  BP: (!) 156/94  (!) 153/95   Pulse: 62 60 67 66  Resp: 14 13 14 15   Temp:      TempSrc:      SpO2: 94% 94% 95% 94%  Weight:      Height:       Physical Exam HENT:     Head: Normocephalic.     Mouth/Throat:     Pharynx: No oropharyngeal exudate.  Eyes:     General: Lids are normal.     Conjunctiva/sclera: Conjunctivae normal.  Cardiovascular:     Rate and Rhythm: Normal rate and regular rhythm.     Heart sounds: Normal heart sounds, S1 normal and S2 normal.  Pulmonary:     Breath sounds: No decreased breath sounds, wheezing, rhonchi or rales.  Abdominal:     Palpations: Abdomen is soft.     Tenderness: There is no abdominal tenderness.  Musculoskeletal:     Right lower leg: No swelling.     Left lower leg: No swelling.  Skin:    General: Skin is warm.     Comments: Right bottom of the foot ulcer.  Neurological:     Mental Status: He is alert.     Data Reviewed: MRI negative for osteomyelitis, lactic acid 1.7, hemoglobin 10.2, creatinine 1.13  Family Communication: Updated daughter yesterday.  Left message today  Disposition: Status is: Inpatient Remains inpatient appropriate because: Still awaiting sensitivities on Staph aureus.  Continue IV vancomycin .  Likely will need a TEE.  Planned Discharge Destination: Home    Time spent: 28 minutes  Author: [name], MD 07/17/2024 1:02 PM  For on call review [email or web address].

## 2024-07-17 NOTE — TOC Initial Note (Signed)
 Transition of Care [facility]) - Initial/Assessment Note    Patient Details  Name: Andrew Buchanan MRN: [id number] Date of Birth: [birth year]  Transition of Care [facility]) CM/SW Contact:    [place] L Shem Plemmons, LCSW Phone Number: 07/17/2024, 12:45 PM  Clinical Narrative:                  Chart review. Patient does not have a PCP listed. CSW searched [facility] but there was not a provider connected to patient. CSW attempted to contact the daughter, Paige, but to no avail. A message was left.   PCP resources have been added to the AVS.        Patient Goals and CMS Choice            Expected Discharge Plan and Services                                              Prior Living Arrangements/Services                       Activities of Daily Living   ADL Screening (condition at time of admission) Independently performs ADLs?: Yes (appropriate for developmental age) Is the patient deaf or have difficulty hearing?: No Does the patient have difficulty seeing, even when wearing glasses/contacts?: No Does the patient have difficulty concentrating, remembering, or making decisions?: No  Permission Sought/Granted                  Emotional Assessment              Admission diagnosis:  Hyperglycemia [R73.9] Abrasion, nose w/o infection [S00.31XA] 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] Diabetic infection of right foot (HCC) [Z88.371, L08.9] Patient Active Problem List   Diagnosis Date Noted   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   Staphylococcus aureus bacteremia without sepsis 07/16/2024   Lactic acidosis 07/16/2024   Chronic pain syndrome 07/16/2024   Hyperosmolar hyperglycemic state (HHS) (HCC) 07/15/2024   Hypokalemia 06/10/2024   Hypomagnesemia 06/10/2024   Neck abscess 06/10/2024   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) 11/20/2023   Rhabdomyolysis 08/25/2021   Polysubstance abuse (HCC) 08/25/2021   Anxiety  08/25/2021   Elevated troponin 08/25/2021   Primary hypertension 08/25/2021   History of bacteremia 08/25/2021   Malnutrition of moderate degree 01/07/2021   Noncompliance with medications 05/19/2020   Alcohol intoxication delirium 11/26/2018   Alcohol abuse 11/26/2018   Bipolar disorder, unspecified (HCC) 11/26/2018   PCP:  System, Provider Not In Pharmacy:   [id number] [place], [place] - [address] AT [facility] OF [address] [place] [address] Phone: [phone] Fax: [phone]  [facility] [address] [place] [address] Phone: [phone] Fax: [phone]     Social Drivers of Health (SDOH) Social History: SDOH Screenings   Food Insecurity: No Food Insecurity (06/11/2024)  Recent Concern: Food Insecurity - Food Insecurity Present (03/23/2024)   Received from [facility]  Housing: Low Risk  (06/11/2024)  Transportation Needs: No Transportation Needs (06/11/2024)  Recent Concern: Transportation Needs - Unmet Transportation Needs (03/23/2024)   Received from [facility]  Utilities: Not At Risk (06/11/2024)  Financial Resource Strain: Medium Risk (11/15/2022)   Received from [facility]  Physical Activity: Inactive (  11/15/2022)   Received from [facility]  Social Connections: Socially Isolated (11/15/2022)   Received from [facility]  Stress: Stress Concern Present (11/15/2022)   Received from [facility]  Tobacco Use: High Risk (07/15/2024)  Health Literacy: Low Risk  (11/15/2022)   Received from [facility]   SDOH Interventions:     Readmission Risk Interventions     No data to display

## 2024-07-17 NOTE — Progress Notes (Signed)
*  PRELIMINARY RESULTS* Echocardiogram 2D Echocardiogram has been performed.  Floydene Harder 07/17/2024, 11:41 AM

## 2024-07-17 NOTE — Progress Notes (Addendum)
       CROSS COVER NOTE  NAME: Andrew Buchanan MRN: [id number] DOB : [birth year]    Concern as stated by nurse / staff   hello there! Andrew Buchanan was admitted to us  for dka as stepdown. He is also on ciwa as he is polysub abuse. He is no longer on the insulin  gtt. He was on precedex  for his agitation, but was weaned off due to bradycardia. Nothing we give him is working. Can we try phenobarb perhaps?  he has had multiple doses of ativan  and he is still climbing out of the bed.   6 mins he does have a sitter as he is also IVC  4 mins unless you want to try to restart the dex?  BB he is quite agitated.      Pertinent findings on chart review: Patient has been on and off Precedex   over the past 24 hours  Patient Assessment   Assessment and  Interventions   Assessment: Acute alcohol withdrawal with delirium and agitation failing librium  and ativan    Plan: Precedex  ordered-(avoiding atypical antipsychotics for agitation to avoid precipitating seizures)

## 2024-07-17 NOTE — Progress Notes (Addendum)
 Patient with more aggressive and threatening behavior to staff. Continues to try to get up from the bed. Very impulsive. Now on precedex . Will continue to monitor. Sitter at bedside.

## 2024-07-18 ENCOUNTER — Inpatient Hospital Stay

## 2024-07-18 DIAGNOSIS — M25561 Pain in right knee: Secondary | ICD-10-CM

## 2024-07-18 DIAGNOSIS — E11628 Type 2 diabetes mellitus with other skin complications: Secondary | ICD-10-CM | POA: Diagnosis not present

## 2024-07-18 DIAGNOSIS — E872 Acidosis, unspecified: Secondary | ICD-10-CM | POA: Diagnosis not present

## 2024-07-18 DIAGNOSIS — R7881 Bacteremia: Secondary | ICD-10-CM | POA: Diagnosis not present

## 2024-07-18 DIAGNOSIS — B9562 Methicillin resistant Staphylococcus aureus infection as the cause of diseases classified elsewhere: Secondary | ICD-10-CM

## 2024-07-18 LAB — BASIC METABOLIC PANEL WITH GFR
Anion gap: 6 (ref 5–15)
BUN: 14 mg/dL (ref 6–20)
CO2: 26 mmol/L (ref 22–32)
Calcium: 7.8 mg/dL — ABNORMAL LOW (ref 8.9–10.3)
Chloride: 99 mmol/L (ref 98–111)
Creatinine, Ser: 1.14 mg/dL (ref 0.61–1.24)
GFR, Estimated: >60 mL/min (ref >60)
Glucose, Bld: 286 mg/dL — ABNORMAL HIGH (ref 70–99)
Potassium: 4.7 mmol/L (ref 3.5–5.1)
Sodium: 131 mmol/L — ABNORMAL LOW (ref 135–145)

## 2024-07-18 LAB — GLUCOSE, CAPILLARY
Glucose-Capillary: 238 mg/dL — ABNORMAL HIGH (ref 70–99)
Glucose-Capillary: 256 mg/dL — ABNORMAL HIGH (ref 70–99)
Glucose-Capillary: 314 mg/dL — ABNORMAL HIGH (ref 70–99)
Glucose-Capillary: 207 mg/dL — ABNORMAL HIGH (ref 70–99)

## 2024-07-18 LAB — HCV INTERPRETATION

## 2024-07-18 MED ORDER — LORAZEPAM 1 MG PO TABS
1.0000 mg | ORAL_TABLET | ORAL | Status: DC | PRN
  Administered 2024-07-18 (×2): 1 mg via ORAL
  Administered 2024-07-19: 2 mg via ORAL
  Administered 2024-07-19: 1 mg via ORAL
  Filled 2024-07-18 (×2): qty 1
  Filled 2024-07-18: qty 2
  Filled 2024-07-18: qty 1

## 2024-07-18 MED ORDER — LORAZEPAM 2 MG/ML IJ SOLN
1.0000 mg | INTRAMUSCULAR | Status: DC | PRN
  Administered 2024-07-19 (×2): 2 mg via INTRAVENOUS
  Administered 2024-07-19: 1 mg via INTRAVENOUS
  Filled 2024-07-18 (×4): qty 1

## 2024-07-18 MED ORDER — INSULIN GLARGINE 100 UNIT/ML ~~LOC~~ SOLN
18.0000 [IU] | Freq: Every day | SUBCUTANEOUS | Status: DC
  Administered 2024-07-18: 18 [IU] via SUBCUTANEOUS
  Filled 2024-07-18: qty 0.18

## 2024-07-18 MED ORDER — GADOBUTROL 1 MMOL/ML IV SOLN
7.0000 mL | Freq: Once | INTRAVENOUS | Status: AC | PRN
Start: 2024-07-18 — End: 2024-07-18
  Administered 2024-07-18: 7 mL via INTRAVENOUS

## 2024-07-18 MED ORDER — INSULIN ASPART 100 UNIT/ML IJ SOLN
3.0000 [IU] | Freq: Three times a day (TID) | INTRAMUSCULAR | Status: DC
  Administered 2024-07-18: 3 [IU] via SUBCUTANEOUS
  Filled 2024-07-18: qty 1

## 2024-07-18 NOTE — Plan of Care (Signed)
 Pt anxious and agitated, wants to leave. Pt c/o pain, oxy 10mg  given Q 4H. Wound dressing done. Pt refuse to sleep, seeking meds. Charge RN came to reassure pt but was rude, security came to pt room to make sure he was calm; Pt trying to get some sleep now. Problem: Coping: Goal: Ability to adjust to condition or change in health will improve Outcome: Progressing   Problem: Metabolic: Goal: Ability to maintain appropriate glucose levels will improve Outcome: Progressing   Problem: Cardiac: Goal: Ability to maintain an adequate cardiac output will improve Outcome: Progressing   Problem: Urinary Elimination: Goal: Ability to achieve and maintain adequate renal perfusion and functioning will improve Outcome: Progressing   Problem: Safety: Goal: Ability to remain free from injury will improve Outcome: Progressing

## 2024-07-18 NOTE — Assessment & Plan Note (Signed)
 MRI negative for infection

## 2024-07-18 NOTE — Plan of Care (Signed)
  Problem: Fluid Volume: Goal: Ability to maintain a balanced intake and output will improve Outcome: Progressing   Problem: Health Behavior/Discharge Planning: Goal: Ability to manage health-related needs will improve Outcome: Progressing   Problem: Metabolic: Goal: Ability to maintain appropriate glucose levels will improve Outcome: Progressing   Problem: Nutritional: Goal: Maintenance of adequate nutrition will improve Outcome: Progressing   Problem: Activity: Goal: Risk for activity intolerance will decrease Outcome: Progressing   Problem: Coping: Goal: Ability to adjust to condition or change in health will improve Outcome: Not Progressing   Problem: Health Behavior/Discharge Planning: Goal: Ability to identify and utilize available resources and services will improve Outcome: Not Progressing   Problem: Coping: Goal: Level of anxiety will decrease Outcome: Not Progressing   Problem: Pain Managment: Goal: General experience of comfort will improve and/or be controlled Outcome: Not Progressing

## 2024-07-18 NOTE — Progress Notes (Signed)
 Progress Note   Patient: Andrew Buchanan FMW:[id number] DOB: [birth year] DOA: 07/15/2024     2 DOS: the patient was seen and examined on 07/18/2024   Brief hospital course: 53 y.o. male with medical history significant of hypertension, diabetes, HHS, foot ulcers, substance use, alcohol use, medication nonadherence, anxiety, bipolar disorder presenting with hyperglycemia and foot pain.   Patient reports 1 to 2 days of soreness at his right foot.  Has known ulcers on his feet with history of diabetes.  States he had some issues with his balance due to some of his foot pain as well as some dizziness and he had a fall where he hit his face yesterday.  Did not lose consciousness.   Today he notes his blood sugar was reading greater than 900 .  Reports he did try taking several doses of insulin  prior to coming to the ED.   Denies fevers, chills, chest pain, shortness of breath, abdominal pain, constipation, diarrhea, nausea, vomiting. Initially denied recent alcohol use in the ED but lab work showed ethanol level of 326 and patient reports drinking alcohol to me but denies any other illicit/nonprescribed substance use.   ED Course: Vital signs in the ED notable for blood pressure in the 130s-150 systolic.  Lab workup included CMP with potassium 4.5, creatinine stable at 1.36, glucose 550, calcium  8.3, albumin 3.2, AST 355, ALT 57.  CBC with hemoglobin stable 11.5.  PT 16.2, INR normal.  Lactic acid 6.7, repeat pending.  Lipase pending.  Procalcitonin pending.  Ammonia level mildly elevated at 52.  Ethanol level 326.  UDS positive for opiates and benzos.  Urinalysis with glucose only.  Blood cultures pending.  VBG with normal pH and normal pCO2.  Beta hydroxybutyric acid normal.   Patient declined x-ray of his foot.  CT head and CT C-spine showed no acute abnormality but did demonstrate stable chronic C6 fractures.   Patient received oxycodone , Ativan  x 4, vancomycin , cefepime , Flagyl  in the ED as  well as 1.5 L of IV fluids.   Patient initially significantly agitated and had to have male nurse due to abusive language and behaviors.  ICU was consulted for possible Precedex  however patient calmed down by the time he was evaluated and was deemed stable to be admitted to hospitalist.  Will continue close monitoring in stepdown for now.  Evaluated by psychiatry who agreed patient did not have capacity.  Patient has been IVC'd and will be reevaluated by psychiatry as intoxication improves.  10/9.  Overnight needed to be placed on a Precedex  drip.  Precedex  discontinued today with pulse being low.  Continue to monitor mental state.  Positive blood culture with Staphylococcus aureus.  Patient has foot infection.  Continue vancomycin .  Will get ID consultation and podiatry consultation.  MRI of the foot ordered. 10/10.  Patient required Precedex  drip overnight.  Hopefully can taper off again today.  MRI of the foot negative for osteomyelitis. 10/11.  Patient complaining of right knee pain will get an MRI of the right knee.  Repeat blood cultures drawn this morning.  Assessment and Plan: * MRSA bacteremia 1 blood culture positive for Staph aureus.  Likely secondary to foot infection.  MRI of the right foot negative for osteomyelitis.  Echocardiogram negative for endocarditis.  Patient refused TEE.  Appreciate ID and podiatry consultations.  On vancomycin .  Repeat blood cultures this morning.  Patient states he needs to get out of the hospital soon as possible.  I told him we are treating  an infection that needs IV antibiotics.  He will need to be patient with us  until we set up a plan.  Right knee pain Get MRI of the right knee to rule out infection source.  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  Patient admitted with uncontrolled diabetes with a sugar of 550.  Patient was initially placed on insulin  drip.   Increase long-acting insulin  to 18 units nightly continue sliding scale insulin  plus standing dose prior to meals.  Alcohol intoxication delirium Placed on alcohol withdrawal protocol.  Will complete Librium  taper.  Anxiety On clonazepam  as needed  Chronic pain syndrome Patient on Percocet 07/10/2024 and gabapentin   Polysubstance abuse (HCC) Urine toxicology positive for benzodiazepine and opiates.  Bipolar disorder, unspecified (HCC) Seen by psychiatry        Subjective: Patient declined TEE.  Patient admitted with MRSA bacteremia.  Complains of a lot of pain in his right foot.  Physical Exam: Vitals:   07/18/24 0550 07/18/24 0927 07/18/24 0928 07/18/24 0933  BP: (!) 141/99 (!) 141/99 (!) 150/93 (!) 150/93  Pulse: 92  88 88  Resp: 18  20   Temp: 97.7 F (36.5 C)  98.3 F (36.8 C)   TempSrc: Oral  Oral   SpO2: 100%  100%   Weight:      Height:       Physical Exam HENT:     Head: Normocephalic.     Mouth/Throat:     Pharynx: No oropharyngeal exudate.  Eyes:     General: Lids are normal.     Conjunctiva/sclera: Conjunctivae normal.  Cardiovascular:     Rate and Rhythm: Normal rate and regular rhythm.     Heart sounds: Normal heart sounds, S1 normal and S2 normal.  Pulmonary:     Breath sounds: No decreased breath sounds, wheezing, rhonchi or rales.  Abdominal:     Palpations: Abdomen is soft.     Tenderness: There is no abdominal tenderness.  Musculoskeletal:     Right knee: Decreased range of motion.     Right lower leg: No swelling.     Left lower leg: No swelling.  Skin:    General: Skin is warm.  Neurological:     Mental Status: He is alert.     Data Reviewed: Creatinine 1.14 sodium 131 Repeat blood cultures drawn this morning. Blood culture reintubating for better growth  Family Communication: Tried to reach patient's daughter  Disposition: Status is: Inpatient Remains inpatient appropriate because: Patient states that he needs to leave the  hospital soon as possible.  Treating for MRSA bacteremia with IV antibiotics.  Planned Discharge Destination: Home    Time spent: 28 minutes  Author: [name], MD 07/18/2024 3:42 PM  For on call review [email or web address].

## 2024-07-18 NOTE — Consult Note (Signed)
 [facility] Cardiology  CARDIOLOGY CONSULT NOTE  Patient ID: Andrew Buchanan DOB/AGE: Nov 22, 1970 53 y.o.  Admit date: 07/15/2024 Referring Physician [facility] Primary Physician  Primary Cardiologist  Reason for Consultation bacteremia  HPI: 53 year old gentleman referred for transesophageal echocardiogram.  Patient admitted 07/15/2024 for diabetic ulcers on right foot.  Blood cultures were positive for MRSA bacteremia.  2D echocardiogram 07/17/2024 revealed LVEF 60-65% without significant valvular abnormalities.  Patient seen by infectious disease who recommends transesophageal echocardiogram to determine duration of antibiotic therapy.  The patient has a history of chronic pain, and polysubstance abuse.  He reports he had difficult time with upper endoscopy and is hesitant to proceed with transesophageal echocardiogram.  Review of systems complete and found to be negative unless listed above     Past Medical History:  Diagnosis Date   AKI (acute kidney injury) 01/08/2021   Alcohol abuse    Anxiety    Cellulitis and abscess of neck 06/10/2024   Diabetes mellitus without complication (HCC)    Diabetic foot infection (HCC) 11/20/2023   DKA (diabetic ketoacidosis) (HCC) 05/19/2020   DKA, type 2 (HCC) 06/09/2024   History of MRSA infection 12/08/2023   11/2023 Diabetic foot wound requiring surgical debridement, cultures MRSA+     Hyperosmolar hyperglycemic state (HHS) (HCC) 01/05/2021   Hypertension    Pancreatitis    Sepsis with acute renal failure without septic shock (HCC)    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  14 tablet 0 07/14/2024   gabapentin  (NEURONTIN ) 300 MG capsule Take 2 capsules (600 mg total) by mouth 2 (two) times daily. 30 capsule 1 07/14/2024   insulin  lispro (HUMALOG) 100 UNIT/ML injection Inject 5-14 Units into the skin 3 (three) times daily with meals.   07/14/2024   insulin  NPH-regular Human (HUMULIN  70/30) (70-30) 100 UNIT/ML injection Inject 18 Units into the skin 2 (two) times daily with a meal. 10 mL 11 07/14/2024   losartan  (COZAAR ) 100 MG tablet Take 100 mg by mouth daily.   07/14/2024   [START ON 07/19/2024] oxyCODONE -acetaminophen  (PERCOCET) 10-325 MG tablet Take 1 tablet by mouth every 6 (six) hours as needed for pain.   07/15/2024   blood glucose meter kit and supplies KIT Dispense based on patient and insurance preference. Use up to four times daily as directed. 1 each 0    Continuous Glucose Sensor (DEXCOM G7 SENSOR) MISC Apply to skin as directed. Change every 10 days. 1 each 0    Insulin  Pen Needle 31G X 5 MM MISC 1 each by Does not apply route 4 (four) times daily. 100 each 2    pioglitazone  (ACTOS ) 15 MG tablet Take 15 mg by mouth daily. (Patient not taking: Reported on 07/15/2024)   Not Taking   Social History   Socioeconomic History   Marital status: Single    Spouse name: Not on file   Number of children: Not on file   Years of education: Not on file   Highest education level: Not on file  Occupational History   Not on file  Tobacco Use   Smoking status:  Some Days    Types: Cigars   Smokeless tobacco: Never  Substance and Sexual Activity   Alcohol use: Yes    Comment: 1 bottle (a pint) of vodka per day.   Drug use: Yes    Types: Crack cocaine, Benzodiazepines, Cocaine   Sexual activity: Not on file  Other Topics Concern   Not on file  Social History Narrative   Not on file   Social Drivers of Health   Financial Resource Strain: Medium Risk (11/15/2022)   Received from [facility]   Overall Financial Resource Strain (CARDIA)    Difficulty of Paying  Living Expenses: Somewhat hard  Food Insecurity: No Food Insecurity (06/11/2024)   Hunger Vital Sign    Worried About Running Out of Food in the Last Year: Never true    Ran Out of Food in the Last Year: Never true  Recent Concern: Food Insecurity - Food Insecurity Present (03/23/2024)   Received from [facility]   Hunger Vital Sign    Within the past 12 months, you worried that your food would run out before you got the money to buy more.: Sometimes true    Within the past 12 months, the food you bought just didn't last and you didn't have money to get more.: Sometimes true  Transportation Needs: No Transportation Needs (06/11/2024)   PRAPARE - Administrator, Civil Service (Medical): No    Lack of Transportation (Non-Medical): No  Recent Concern: Transportation Needs - Unmet Transportation Needs (03/23/2024)   Received from [facility] - Transportation    Lack of Transportation (Medical): Yes    Lack of Transportation (Non-Medical): Yes  Physical Activity: Inactive (11/15/2022)   Received from [facility]   Exercise Vital Sign    On average, how many days per week do you engage in moderate to strenuous exercise (like a brisk walk)?: 0 days    On average, how many minutes do you engage in exercise at this level?: 0 min  Stress: Stress Concern Present (11/15/2022)   Received from [facility] of Occupational [facility] - Occupational Stress Questionnaire    Feeling of Stress : Rather much  Social Connections: Socially Isolated (11/15/2022)   Received from [facility]   Social Connection and Isolation Panel    In a typical week, how many times do you talk on the phone with family, friends, or neighbors?: More than three times a week    How often do you get together with friends or relatives?: Twice a week    How often do you attend church or religious services?: Never    Do you belong to any clubs or organizations such as church groups,  unions, fraternal or athletic groups, or school groups?: No    How often do you attend meetings of the clubs or organizations you belong to?: Never    Are you married, widowed, divorced, separated, never married, or living with a partner?: Never married  Intimate Partner Violence: Not At Risk (06/11/2024)   Humiliation, Afraid, Rape, and Kick questionnaire    Fear of Current or Ex-Partner: No    Emotionally Abused: No    Physically Abused: No    Sexually Abused: No    Family History  Problem Relation Age of Onset   Alcoholism Father       Review of systems complete and found to be negative unless listed above  PHYSICAL EXAM  General: Well developed, well nourished, in no acute distress HEENT:  Normocephalic and atramatic Neck:  No JVD.  Lungs: Clear bilaterally to auscultation and percussion. Heart: HRRR . Normal S1 and S2 without gallops or murmurs.  Abdomen: Bowel sounds are positive, abdomen soft and non-tender  Msk:  Back normal, normal gait. Normal strength and tone for age. Extremities: No clubbing, cyanosis or edema.   Neuro: Alert and oriented X 3. Psych:  Good affect, responds appropriately  Labs:   Lab Results  Component Value Date   WBC 6.8 07/16/2024   HGB 10.2 (L) 07/17/2024   HCT 30.5 (L) 07/16/2024   MCV 101.7 (H) 07/16/2024   PLT 245 07/16/2024    Recent Labs  Lab 07/15/24 0922 07/15/24 1330 07/18/24 0609  NA 135   < > 131*  K 4.5   < > 4.7  CL 99   < > 99  CO2 22   < > 26  BUN 14   < > 14  CREATININE 1.26*   < > 1.14  CALCIUM  8.3*   < > 7.8*  PROT 7.1  --   --   BILITOT 0.6  --   --   ALKPHOS 100  --   --   ALT 57*  --   --   AST 355*  --   --   GLUCOSE 550*   < > 286*   < > = values in this interval not displayed.   Lab Results  Component Value Date   CKTOTAL 1,923 (H) 08/27/2021   No results found for: CHOL No results found for: HDL No results found for: LDLCALC No results found for: TRIG No results found for:  CHOLHDL No results found for: LDLDIRECT    Radiology: ECHOCARDIOGRAM COMPLETE Result Date: 07/17/2024    ECHOCARDIOGRAM REPORT   Patient Name:   KALVYN DESA Date of Exam: 07/17/2024 Medical Rec #:  Buchanan             Height:       72.0 in Accession #:    [id number]            Weight:       155.4 lb Date of Birth:  12/10/1970             BSA:          1.914 m Patient Age:    53 years              BP:           156/94 mmHg Patient Gender: M                     HR:           60 bpm. Exam Location:  [facility] Procedure: 2D Echo, Cardiac Doppler and Color Doppler (Both Spectral and Color            Flow Doppler were utilized during procedure). Indications:     Bacteremia R78.81  History:         Patient has no prior history of Echocardiogram examinations.                  Risk Factors:Diabetes and Hypertension. Alcohol abuse.  Sonographer:     Christopher Furnace Referring Phys:  [id number] [name] Diagnosing Phys: Caron Poser  Sonographer Comments: Technically difficult study due to poor echo windows. IMPRESSIONS  1. Technically difficult study. Limited views available.  2. Left  ventricular ejection fraction, by estimation, is 60 to 65%. The left ventricle has normal function. Left ventricular endocardial border not optimally defined to evaluate regional wall motion. There is mild left ventricular hypertrophy. Left ventricular diastolic parameters are consistent with Grade I diastolic dysfunction (impaired relaxation).  3. Right ventricular systolic function is normal. The right ventricular size is normal. There is normal pulmonary artery systolic pressure.  4. The inferior vena cava is normal in size with greater than 50% respiratory variability, suggesting right atrial pressure of 3 mmHg. Comparison(s): No prior Echocardiogram. Conclusion(s)/Recommendation(s): No evidence of valvular vegetations on this transthoracic echocardiogram. FINDINGS  Left Ventricle: Left ventricular ejection fraction, by  estimation, is 60 to 65%. The left ventricle has normal function. Left ventricular endocardial border not optimally defined to evaluate regional wall motion. The left ventricular internal cavity size was normal in size. There is mild left ventricular hypertrophy. Left ventricular diastolic parameters are consistent with Grade I diastolic dysfunction (impaired relaxation). Right Ventricle: The right ventricular size is normal. No increase in right ventricular wall thickness. Right ventricular systolic function is normal. There is normal pulmonary artery systolic pressure. The tricuspid regurgitant velocity is 1.49 m/s, and  with an assumed right atrial pressure of 3 mmHg, the estimated right ventricular systolic pressure is 11.9 mmHg. Left Atrium: Left atrial size was normal in size. Right Atrium: Right atrial size was normal in size. Pericardium: There is no evidence of pericardial effusion. Presence of epicardial fat layer. Mitral Valve: The mitral valve is grossly normal. No evidence of mitral valve regurgitation. No evidence of mitral valve stenosis. MV peak gradient, 1.4 mmHg. The mean mitral valve gradient is 1.0 mmHg. Tricuspid Valve: The tricuspid valve is not well visualized. Tricuspid valve regurgitation is not demonstrated. No evidence of tricuspid stenosis. Aortic Valve: The aortic valve was not well visualized. Aortic valve regurgitation is not visualized. No aortic stenosis is present. Aortic valve mean gradient measures 1.0 mmHg. Aortic valve peak gradient measures 2.2 mmHg. Aortic valve area, by VTI measures 4.19 cm. Pulmonic Valve: The pulmonic valve was not well visualized. Pulmonic valve regurgitation is not visualized. Aorta: The aortic root was not well visualized. Venous: The inferior vena cava is normal in size with greater than 50% respiratory variability, suggesting right atrial pressure of 3 mmHg. IAS/Shunts: The interatrial septum was not well visualized.  LEFT VENTRICLE PLAX 2D LVIDd:          3.68 cm   Diastology LVIDs:         2.34 cm   LV e' medial:    4.45 cm/s LV PW:         1.07 cm   LV E/e' medial:  10.4 LV IVS:        1.17 cm   LV e' lateral:   9.67 cm/s LVOT diam:     2.20 cm   LV E/e' lateral: 4.8 LV SV:         45 LV SV Index:   23 LVOT Area:     3.80 cm  RIGHT VENTRICLE RV Basal diam:  2.98 cm RV Mid diam:    2.78 cm RV S prime:     12.00 cm/s TAPSE (M-mode): 2.0 cm LEFT ATRIUM           Index        RIGHT ATRIUM           Index LA diam:      3.20 cm 1.67 cm/m   RA Area:  13.90 cm LA Vol (A4C): 52.4 ml 27.38 ml/m  RA Volume:   32.40 ml  16.93 ml/m  AORTIC VALVE AV Area (Vmax):    3.31 cm AV Area (Vmean):   3.63 cm AV Area (VTI):     4.19 cm AV Vmax:           74.60 cm/s AV Vmean:          52.300 cm/s AV VTI:            0.107 m AV Peak Grad:      2.2 mmHg AV Mean Grad:      1.0 mmHg LVOT Vmax:         65.00 cm/s LVOT Vmean:        50.000 cm/s LVOT VTI:          0.118 m LVOT/AV VTI ratio: 1.10  AORTA Ao Root diam: 3.20 cm MITRAL VALVE               TRICUSPID VALVE MV Area (PHT): 1.69 cm    TR Peak grad:   8.9 mmHg MV Area VTI:   2.39 cm    TR Vmax:        149.00 cm/s MV Peak grad:  1.4 mmHg MV Mean grad:  1.0 mmHg    SHUNTS MV Vmax:       0.59 m/s    Systemic VTI:  0.12 m MV Vmean:      39.9 cm/s   Systemic Diam: 2.20 cm MV Decel Time: 449 msec MV E velocity: 46.10 cm/s MV A velocity: 44.10 cm/s MV E/A ratio:  1.05 Caron Poser Electronically signed by Caron Poser Signature Date/Time: 07/17/2024/4:18:34 PM    Final    MR FOOT RIGHT WO CONTRAST Result Date: 07/17/2024 CLINICAL DATA:  Diabetic foot swelling EXAM: MRI OF THE RIGHT FOREFOOT WITHOUT CONTRAST TECHNIQUE: Multiplanar, multisequence MR imaging of the right foot from the midfoot through the toes was performed. No intravenous contrast was administered. COMPARISON:  Radiographs 07/15/2024 FINDINGS: Bones/Joint/Cartilage Metal artifact from axial lag screw in the fifth metatarsal obscuring the surrounding tissues. No  substantial marrow edema to suggest osteomyelitis identified. Mild degenerative spurring along portions of the midfoot and Lisfranc joint. Small well corticated ossicle below the medial malleolus. Ligaments Lisfranc ligament intact. Muscles and Tendons Mild regional muscular atrophy with some accentuated T2 signal throughout the regional musculature, probably neurogenic. 3.2 cm discontinuous segment of the medial band of the plantar fascia as on image 24 series 8, possibly from prior tear, plantar fascia release, or focal weakening of the plantar fascia related to prior infection. There is underlying effacement of the subcutaneous adipose tissue and possible underlying wound. Soft tissues As noted above, suspected plantar wounds at the midfoot level with likely components of ulceration but no drainable abscess is observed. IMPRESSION: 1. No findings of osteomyelitis. 2. 3.2 cm discontinuous segment of the medial band of the plantar fascia at the midfoot level, possibly from prior tear, plantar fascia release, or focal weakening of the plantar fascia related to prior infection. There is underlying effacement of the subcutaneous adipose tissue and possible underlying wound. 3. Mild regional muscular atrophy with some accentuated T2 signal throughout the regional musculature, probably neurogenic. 4. Mild degenerative spurring along portions of the midfoot and Lisfranc joint. Electronically Signed   By: Ryan Salvage M.D.   On: 07/17/2024 08:35   DG Foot Complete Right Result Date: 07/15/2024 CLINICAL DATA:  Diabetic foot infection.  Foot ulcer. EXAM: RIGHT FOOT COMPLETE -  3+ VIEW COMPARISON:  07/30/2007 FINDINGS: Chronic surgical screw in the proximal fifth metatarsal bone. Evidence for old fracture with cortical thickening in the mid fourth metatarsal bone. Extension of the toes on these radiographs. Negative for an acute fracture or dislocation. No evidence for cortical destruction or periosteal reaction.  Soft tissue fullness or swelling along the plantar aspect of the foot. IMPRESSION: 1. No acute bone abnormality in the right foot. 2. Surgical screw in the proximal fifth metatarsal bone. 3. Soft tissue fullness or swelling along the plantar aspect of the foot. Electronically Signed   By: Juliene Balder M.D.   On: 07/15/2024 13:41   CT Cervical Spine Wo Contrast Result Date: 07/15/2024 EXAM: CT CERVICAL SPINE WITHOUT CONTRAST 07/15/2024 09:59:13 AM TECHNIQUE: CT of the cervical spine was performed without the administration of intravenous contrast. Multiplanar reformatted images are provided for review. Automated exposure control, iterative reconstruction, and/or weight based adjustment of the mA/kV was utilized to reduce the radiation dose to as low as reasonably achievable. COMPARISON: Cervical spine CT 01/05/2021. CLINICAL HISTORY: 53 year old male with blunt facial trauma and reported nasal fracture from yesterday. FINDINGS: CERVICAL SPINE: BONES AND ALIGNMENT: Stable and normal cervical lordosis. Normal background mineralization. Chronic and unhealed bilateral C6 lamina/spinous process fracture as seen on series 6 image 73, unchanged from prior. Superimposed probable healed posterior lamina and spinous process fracture at C5, stable. No acute osseous abnormality identified. DEGENERATIVE CHANGES: Cervical spine degeneration with no significant spinal stenosis by CT. SOFT TISSUES: No prevertebral soft tissue swelling. IMPRESSION: 1. No acute traumatic injury in the cervical spine. 2. Stable Chronic and unhealed C6 lamina/spinous process fracture. Electronically signed by: Helayne Hurst MD 07/15/2024 10:21 AM EDT RP Workstation: [id number]   CT Head Wo Contrast Result Date: 07/15/2024 EXAM: CT HEAD WITHOUT CONTRAST 07/15/2024 09:59:13 AM TECHNIQUE: CT of the head was performed without the administration of intravenous contrast. Automated exposure control, iterative reconstruction, and/or weight based adjustment of  the mA/kV was utilized to reduce the radiation dose to as low as reasonably achievable. COMPARISON: Head CT 08/25/2021. CLINICAL HISTORY: 53 year old male with blunt facial trauma, dried blood to nose, and reported nasal fracture from yesterday. FINDINGS: BRAIN AND VENTRICLES: No acute hemorrhage. No evidence of acute infarct. No hydrocephalus. No extra-axial collection. No mass effect or midline shift. Cerebral volume not significantly changed from previous exam. No suspicious intracranial vascular hyperdensity. Calcified atherosclerosis. ORBITS: No convincing acute orbital injury. SINUSES: Paranasal sinuses remain well aerated. SOFT TISSUES AND SKULL: No convincing acute scalp soft tissue injury. Chronic appearing nasal bone fractures, unchanged from previous exam. Tympanic cavities and mastoids remain well aerated. IMPRESSION: 1. Negative for age non-contrast head CT. 2. No acute traumatic injury identified. Chronic nasal bone fractures. Electronically signed by: Helayne Hurst MD 07/15/2024 10:12 AM EDT RP Workstation: [id number]    EKG: Sinus rhythm 96 bpm  ASSESSMENT AND PLAN:   1.  MRSA bacteremia, 2D echocardiogram 07/17/2024 revealed LVEF 60 to 65% without significant valvular abnormalities.  Patient hesitant to proceed with transesophageal echocardiogram, with prior difficulty with upper endoscopy.  Recommendations  1.  Agree with current therapy 2.  Defer TEE per patient's wishes 3.  Will sign off for now, please call if patient changes his mind about proceeding with transesophageal echocardiogram  Signed: Marsa Dooms MD,PhD, [facility] 07/18/2024, 11:01 AM

## 2024-07-19 DIAGNOSIS — E875 Hyperkalemia: Secondary | ICD-10-CM | POA: Diagnosis not present

## 2024-07-19 DIAGNOSIS — N179 Acute kidney failure, unspecified: Secondary | ICD-10-CM

## 2024-07-19 DIAGNOSIS — R7881 Bacteremia: Secondary | ICD-10-CM | POA: Diagnosis not present

## 2024-07-19 DIAGNOSIS — E872 Acidosis, unspecified: Secondary | ICD-10-CM | POA: Diagnosis not present

## 2024-07-19 DIAGNOSIS — E871 Hypo-osmolality and hyponatremia: Secondary | ICD-10-CM

## 2024-07-19 DIAGNOSIS — M25561 Pain in right knee: Secondary | ICD-10-CM | POA: Diagnosis not present

## 2024-07-19 LAB — BASIC METABOLIC PANEL WITH GFR
Anion gap: 6 (ref 5–15)
BUN: 19 mg/dL (ref 6–20)
CO2: 25 mmol/L (ref 22–32)
Calcium: 8 mg/dL — ABNORMAL LOW (ref 8.9–10.3)
Chloride: 96 mmol/L — ABNORMAL LOW (ref 98–111)
Creatinine, Ser: 1.37 mg/dL — ABNORMAL HIGH (ref 0.61–1.24)
GFR, Estimated: >60 mL/min (ref >60)
Glucose, Bld: 573 mg/dL (ref 70–99)
Potassium: 6.1 mmol/L — ABNORMAL HIGH (ref 3.5–5.1)
Sodium: 127 mmol/L — ABNORMAL LOW (ref 135–145)

## 2024-07-19 LAB — VANCOMYCIN, TROUGH: Vancomycin Tr: 20 ug/mL (ref 15–20)

## 2024-07-19 LAB — GLUCOSE, CAPILLARY
Glucose-Capillary: 474 mg/dL — ABNORMAL HIGH (ref 70–99)
Glucose-Capillary: 312 mg/dL — ABNORMAL HIGH (ref 70–99)
Glucose-Capillary: 184 mg/dL — ABNORMAL HIGH (ref 70–99)
Glucose-Capillary: 37 mg/dL — CL (ref 70–99)
Glucose-Capillary: 41 mg/dL — CL (ref 70–99)
Glucose-Capillary: 55 mg/dL — ABNORMAL LOW (ref 70–99)
Glucose-Capillary: 97 mg/dL (ref 70–99)
Glucose-Capillary: 135 mg/dL — ABNORMAL HIGH (ref 70–99)
Glucose-Capillary: 370 mg/dL — ABNORMAL HIGH (ref 70–99)
Glucose-Capillary: 475 mg/dL — ABNORMAL HIGH (ref 70–99)

## 2024-07-19 LAB — VANCOMYCIN, PEAK: Vancomycin Pk: 37 ug/mL (ref 30–40)

## 2024-07-19 MED ORDER — SODIUM BICARBONATE 8.4 % IV SOLN
25.0000 meq | Freq: Once | INTRAVENOUS | Status: AC
  Administered 2024-07-19: 25 meq via INTRAVENOUS
  Filled 2024-07-19: qty 50

## 2024-07-19 MED ORDER — CLONAZEPAM 0.5 MG PO TABS
1.0000 mg | ORAL_TABLET | Freq: Two times a day (BID) | ORAL | Status: DC
  Administered 2024-07-19: 1 mg via ORAL
  Filled 2024-07-19: qty 2

## 2024-07-19 MED ORDER — CLONAZEPAM 0.5 MG PO TABS
1.0000 mg | ORAL_TABLET | Freq: Three times a day (TID) | ORAL | Status: DC
  Administered 2024-07-19 – 2024-07-20 (×3): 1 mg via ORAL
  Filled 2024-07-19 (×3): qty 2

## 2024-07-19 MED ORDER — ONDANSETRON HCL 4 MG PO TABS
4.0000 mg | ORAL_TABLET | Freq: Four times a day (QID) | ORAL | Status: DC | PRN
  Administered 2024-07-19 – 2024-07-20 (×2): 4 mg via ORAL
  Filled 2024-07-19 (×2): qty 1

## 2024-07-19 MED ORDER — LORAZEPAM 2 MG/ML IJ SOLN
1.0000 mg | Freq: Once | INTRAMUSCULAR | Status: AC
  Administered 2024-07-19: 1 mg via INTRAVENOUS
  Filled 2024-07-19: qty 1

## 2024-07-19 MED ORDER — SODIUM CHLORIDE 0.9 % IV BOLUS
500.0000 mL | Freq: Once | INTRAVENOUS | Status: AC
  Administered 2024-07-19: 500 mL via INTRAVENOUS

## 2024-07-19 MED ORDER — SODIUM CHLORIDE 0.9 % IV SOLN: 12.5000 mg | Freq: Four times a day (QID) | INTRAVENOUS | Status: DC | PRN

## 2024-07-19 MED ORDER — INSULIN ASPART 100 UNIT/ML IJ SOLN
8.0000 [IU] | Freq: Three times a day (TID) | INTRAMUSCULAR | Status: DC
  Administered 2024-07-19: 8 [IU] via SUBCUTANEOUS
  Filled 2024-07-19: qty 1

## 2024-07-19 MED ORDER — SODIUM ZIRCONIUM CYCLOSILICATE 10 G PO PACK
10.0000 g | PACK | Freq: Once | ORAL | Status: AC
  Administered 2024-07-19: 10 g via ORAL
  Filled 2024-07-19: qty 1

## 2024-07-19 MED ORDER — INSULIN GLARGINE 100 UNIT/ML ~~LOC~~ SOLN
20.0000 [IU] | Freq: Every day | SUBCUTANEOUS | Status: DC
  Administered 2024-07-19: 20 [IU] via SUBCUTANEOUS
  Filled 2024-07-19 (×2): qty 0.2

## 2024-07-19 MED ORDER — INSULIN ASPART 100 UNIT/ML IV SOLN
10.0000 [IU] | Freq: Once | INTRAVENOUS | Status: AC
  Administered 2024-07-19: 10 [IU] via INTRAVENOUS
  Filled 2024-07-19: qty 0.1

## 2024-07-19 MED ORDER — VANCOMYCIN HCL 500 MG/100ML IV SOLN
500.0000 mg | Freq: Two times a day (BID) | INTRAVENOUS | Status: DC
Start: 2024-07-19 — End: 2024-07-20
  Administered 2024-07-19 – 2024-07-20 (×2): 500 mg via INTRAVENOUS
  Filled 2024-07-19 (×3): qty 100

## 2024-07-19 MED ORDER — CALCIUM GLUCONATE-NACL 1-0.675 GM/50ML-% IV SOLN
1.0000 g | Freq: Once | INTRAVENOUS | Status: AC
  Administered 2024-07-19: 1000 mg via INTRAVENOUS
  Filled 2024-07-19: qty 50

## 2024-07-19 MED ORDER — ONDANSETRON HCL 4 MG/2ML IJ SOLN
4.0000 mg | Freq: Four times a day (QID) | INTRAMUSCULAR | Status: DC | PRN
  Administered 2024-07-19: 4 mg via INTRAVENOUS
  Filled 2024-07-19: qty 2

## 2024-07-19 MED ORDER — LORAZEPAM 2 MG/ML IJ SOLN
1.0000 mg | Freq: Two times a day (BID) | INTRAMUSCULAR | Status: DC | PRN
  Administered 2024-07-19 – 2024-07-20 (×2): 1 mg via INTRAVENOUS
  Filled 2024-07-19 (×3): qty 1

## 2024-07-19 MED ORDER — INSULIN GLARGINE 100 UNIT/ML ~~LOC~~ SOLN
23.0000 [IU] | Freq: Every day | SUBCUTANEOUS | Status: DC
  Filled 2024-07-19: qty 0.23

## 2024-07-19 NOTE — Progress Notes (Signed)
 Progress Note   Patient: Andrew Buchanan FMW:[id number] DOB: September 02, 1971 DOA: 07/15/2024     3 DOS: the patient was seen and examined on 07/19/2024   Brief hospital course: 53 y.o. male with medical history significant of hypertension, diabetes, HHS, foot ulcers, substance use, alcohol use, medication nonadherence, anxiety, bipolar disorder presenting with hyperglycemia and foot pain.   Patient reports 1 to 2 days of soreness at his right foot.  Has known ulcers on his feet with history of diabetes.  States he had some issues with his balance due to some of his foot pain as well as some dizziness and he had a fall where he hit his face yesterday.  Did not lose consciousness.   Today he notes his blood sugar was reading greater than 900 .  Reports he did try taking several doses of insulin  prior to coming to the ED.   Denies fevers, chills, chest pain, shortness of breath, abdominal pain, constipation, diarrhea, nausea, vomiting. Initially denied recent alcohol use in the ED but lab work showed ethanol level of 326 and patient reports drinking alcohol to me but denies any other illicit/nonprescribed substance use.   ED Course: Vital signs in the ED notable for blood pressure in the 130s-150 systolic.  Lab workup included CMP with potassium 4.5, creatinine stable at 1.36, glucose 550, calcium  8.3, albumin 3.2, AST 355, ALT 57.  CBC with hemoglobin stable 11.5.  PT 16.2, INR normal.  Lactic acid 6.7, repeat pending.  Lipase pending.  Procalcitonin pending.  Ammonia level mildly elevated at 52.  Ethanol level 326.  UDS positive for opiates and benzos.  Urinalysis with glucose only.  Blood cultures pending.  VBG with normal pH and normal pCO2.  Beta hydroxybutyric acid normal.   Patient declined x-ray of his foot.  CT head and CT C-spine showed no acute abnormality but did demonstrate stable chronic C6 fractures.   Patient received oxycodone , Ativan  x 4, vancomycin , cefepime , Flagyl  in the ED as  well as 1.5 L of IV fluids.   Patient initially significantly agitated and had to have male nurse due to abusive language and behaviors.  ICU was consulted for possible Precedex  however patient calmed down by the time he was evaluated and was deemed stable to be admitted to hospitalist.  Will continue close monitoring in stepdown for now.  Evaluated by psychiatry who agreed patient did not have capacity.  Patient has been IVC'd and will be reevaluated by psychiatry as intoxication improves.  10/9.  Overnight needed to be placed on a Precedex  drip.  Precedex  discontinued today with pulse being low.  Continue to monitor mental state.  Positive blood culture with Staphylococcus aureus.  Patient has foot infection.  Continue vancomycin .  Will get ID consultation and podiatry consultation.  MRI of the foot ordered. 10/10.  Patient required Precedex  drip overnight.  Hopefully can taper off again today.  MRI of the foot negative for osteomyelitis. 10/11.  Patient complaining of right knee pain will get an MRI of the right knee.  Repeat blood cultures drawn this morning.   Assessment and Plan: * MRSA bacteremia 1 blood culture positive for Staph aureus.  Likely secondary to foot infection.  MRI of the right foot negative for osteomyelitis.  Echocardiogram negative for endocarditis.  Patient refused TEE.  Appreciate ID and podiatry consultations.  On vancomycin .  Repeat blood cultures negative for less than 24 hours.  Patient states he needs to get out of the hospital soon as possible.  I  told him we are treating an infection that needs IV antibiotics.  He will need to be patient with us  until we set up a plan.  Hyperkalemia Treated hyperkalemia with Lokelma, IV insulin , sodium bicarb and calcium .  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  Patient admitted with  uncontrolled diabetes with a sugar of 550.  Patient was initially placed on insulin  drip.  Increase long-acting insulin  to 23 units nightly continue sliding scale insulin  plus standing dose prior to meals.  AKI (acute kidney injury) IV fluid bolus  Alcohol intoxication delirium Patient wants to go back on his Klonopin .  Finished Librium  taper.  As needed Ativan .  Anxiety On clonazepam  3 times daily  Hyponatremia Secondary to high sugar  Chronic pain syndrome Patient on Percocet 07/10/2024 and gabapentin   Polysubstance abuse (HCC) Urine toxicology positive for benzodiazepine and opiates.  Bipolar disorder, unspecified (HCC) Seen by psychiatry.  Under IVC.        Subjective: Patient went to go back on his Klonopin  but still wanted Ativan  also.  Explained that these are on the same class.  Admitted with Staph aureus bacteremia.  Physical Exam: Vitals:   07/19/24 0408 07/19/24 0413 07/19/24 0643 07/19/24 0812  BP: (!) 160/95 (!) 160/95 (!) 147/101 (!) 145/102  Pulse: 78 78 92 87  Resp:    20  Temp: 97.8 F (36.6 C)  97.8 F (36.6 C) 97.8 F (36.6 C)  TempSrc: Oral  Oral Oral  SpO2: 100%  100% 100%  Weight:      Height:       Physical Exam HENT:     Head: Normocephalic.     Mouth/Throat:     Pharynx: No oropharyngeal exudate.  Eyes:     General: Lids are normal.     Conjunctiva/sclera: Conjunctivae normal.  Cardiovascular:     Rate and Rhythm: Normal rate and regular rhythm.     Heart sounds: Normal heart sounds, S1 normal and S2 normal.  Pulmonary:     Breath sounds: No decreased breath sounds, wheezing, rhonchi or rales.  Abdominal:     Palpations: Abdomen is soft.     Tenderness: There is no abdominal tenderness.  Musculoskeletal:     Right knee: Decreased range of motion.     Right lower leg: No swelling.     Left lower leg: No swelling.  Skin:    General: Skin is warm.  Neurological:     Mental Status: He is alert.     Data Reviewed: Sodium 127,  potassium 6.1, creatinine 1.37, glucose 573, anion gap 6  Family Communication: Left message for daughter  Disposition: Status is: Inpatient Remains inpatient appropriate because: Currently under IVC.  Continue IV antibiotics for Staph aureus in the blood culture.  Planned Discharge Destination: Home    Time spent: 28 minutes  Author: [name], MD 07/19/2024 11:26 AM  For on call review [email or web address].

## 2024-07-19 NOTE — Plan of Care (Signed)
  Problem: Education: Goal: Ability to describe self-care measures that may prevent or decrease complications (Diabetes Survival Skills Education) will improve Outcome: Not Progressing Goal: Individualized Educational Video(s) Outcome: Not Progressing   Problem: Coping: Goal: Ability to adjust to condition or change in health will improve Outcome: Not Progressing   Problem: Fluid Volume: Goal: Ability to maintain a balanced intake and output will improve Outcome: Not Progressing   Problem: Health Behavior/Discharge Planning: Goal: Ability to identify and utilize available resources and services will improve Outcome: Not Progressing Goal: Ability to manage health-related needs will improve Outcome: Not Progressing   Problem: Metabolic: Goal: Ability to maintain appropriate glucose levels will improve Outcome: Not Progressing   Problem: Nutritional: Goal: Maintenance of adequate nutrition will improve Outcome: Not Progressing Goal: Progress toward achieving an optimal weight will improve Outcome: Not Progressing   Problem: Skin Integrity: Goal: Risk for impaired skin integrity will decrease Outcome: Not Progressing   Problem: Tissue Perfusion: Goal: Adequacy of tissue perfusion will improve Outcome: Not Progressing   Problem: Education: Goal: Ability to describe self-care measures that may prevent or decrease complications (Diabetes Survival Skills Education) will improve Outcome: Not Progressing Goal: Individualized Educational Video(s) Outcome: Not Progressing   Problem: Cardiac: Goal: Ability to maintain an adequate cardiac output will improve Outcome: Not Progressing   Problem: Health Behavior/Discharge Planning: Goal: Ability to identify and utilize available resources and services will improve Outcome: Not Progressing Goal: Ability to manage health-related needs will improve Outcome: Not Progressing   Problem: Fluid Volume: Goal: Ability to achieve a  balanced intake and output will improve Outcome: Not Progressing   Problem: Metabolic: Goal: Ability to maintain appropriate glucose levels will improve Outcome: Not Progressing   Problem: Nutritional: Goal: Maintenance of adequate nutrition will improve Outcome: Not Progressing Goal: Maintenance of adequate weight for body size and type will improve Outcome: Not Progressing   Problem: Respiratory: Goal: Will regain and/or maintain adequate ventilation Outcome: Not Progressing   Problem: Urinary Elimination: Goal: Ability to achieve and maintain adequate renal perfusion and functioning will improve Outcome: Not Progressing   Problem: Education: Goal: Knowledge of General Education information will improve Description: Including pain rating scale, medication(s)/side effects and non-pharmacologic comfort measures Outcome: Not Progressing   Problem: Health Behavior/Discharge Planning: Goal: Ability to manage health-related needs will improve Outcome: Not Progressing   Problem: Clinical Measurements: Goal: Ability to maintain clinical measurements within normal limits will improve Outcome: Not Progressing Goal: Will remain free from infection Outcome: Not Progressing Goal: Diagnostic test results will improve Outcome: Not Progressing Goal: Respiratory complications will improve Outcome: Not Progressing Goal: Cardiovascular complication will be avoided Outcome: Not Progressing   Problem: Activity: Goal: Risk for activity intolerance will decrease Outcome: Not Progressing   Problem: Nutrition: Goal: Adequate nutrition will be maintained Outcome: Not Progressing   Problem: Coping: Goal: Level of anxiety will decrease Outcome: Not Progressing   Problem: Elimination: Goal: Will not experience complications related to bowel motility Outcome: Not Progressing Goal: Will not experience complications related to urinary retention Outcome: Not Progressing   Problem: Pain  Managment: Goal: General experience of comfort will improve and/or be controlled Outcome: Not Progressing   Problem: Safety: Goal: Ability to remain free from injury will improve Outcome: Not Progressing   Problem: Skin Integrity: Goal: Risk for impaired skin integrity will decrease Outcome: Not Progressing

## 2024-07-19 NOTE — Assessment & Plan Note (Signed)
 Treated hyperkalemia with Lokelma, IV insulin , sodium bicarb and calcium .

## 2024-07-19 NOTE — Progress Notes (Signed)
 Pharmacy Antibiotic Note  Andrew Buchanan is a 53 y.o. male admitted on 07/15/2024 with HHS and Diabetic foot wound. Pharmacy has been consulted for Vancomycin  and Cefepime  dosing.   Patient now with MRSA in blood culture.  He has PMH of a plantar R foot wound and underwent I&D by podiatry on 11/21/2023 with OR culture growing MRSA. 10/11 MRI right foot with No findings of osteomyelitis. Bacteremia source suspected to be right foot infection.   Today, 07/19/2024 Day #5 antibiotics - vancomycin   Renal: SCr 1.37 (0.95 on admission 10/8) above baseline WBC WNL Afebrile 10/8 blood culture: 1/4 GPC, BCID MRSA 10/9 wound cx and 10/11 bcx pending  Vancomycin  levels:  Peak 37  (07/19/24 0021), Trough 20  (07/19/24 0945) 750 mg q12h (dose 07/18/24 2236) Steady state Trough level on high end of usual range  Plan: Decrease dose to 500 mg IV q12h based on 2-level calculations Desired AUC 500, Cmin 12.5 Calculated AUC (based on 500 mg q12h) 444.9 Monitor renal function with AM labs Uptrend in Scr, but calculated AUC 444.9 not close to 550 limit Should be enough leeway if Scr continues uptrend and less vancomycin  clearance ID consulted   Height: 6' (182.9 cm) Weight: 70.5 kg (155 lb 6.8 oz) IBW/kg (Calculated) : 77.6  Temp (24hrs), Avg:97.9 F (36.6 C), Min:97.8 F (36.6 C), Max:98.3 F (36.8 C)  Recent Labs  Lab 07/15/24 0913 07/15/24 0922 07/15/24 1330 07/15/24 1854 07/15/24 2353 07/16/24 0323 07/17/24 0720 07/18/24 0609 07/19/24 0021 07/19/24 0613 07/19/24 0945  WBC 7.2  --   --   --   --  6.8  --   --   --   --   --   CREATININE  --    < > 0.95 0.90 1.05 1.12 1.13 1.14  --  1.37*  --   LATICACIDVEN  --    < > 5.8* 7.3* 4.0* 2.3* 1.7  --   --   --   --   VANCOTROUGH  --   --   --   --   --   --   --   --   --   --  20  VANCOPEAK  --   --   --   --   --   --   --   --  37  --   --    < > = values in this interval not displayed.    Estimated Creatinine Clearance:  62.2 mL/min (A) (by C-G formula based on SCr of 1.37 mg/dL (H)).    Allergies  Allergen Reactions   Amlodipine Shortness Of Breath   Antihistamines, Chlorpheniramine-Type Anaphylaxis   Statins Other (See Comments)    Pt declines    Antimicrobials this admission: 10/8 Cefepime  >> 10/9 10/8 Vancomycin   >>  10/8 Flagyl   Dose adjustments: Vancomycin  750mg  q12h >> 10/12 500mg  q12h  Cultures: 10/8 Bcx: MRSA 10/9 Wound cx: pending 10/11 Bcx: pending  Thank you for allowing pharmacy to be a part of this patient's care.  Leonor Argyle, PharmD PGY1 07/19/2024 1:57 PM

## 2024-07-19 NOTE — Progress Notes (Signed)
 Called with a low sugar.  Patient drinking and eating.  Patient had a very high sugar on morning lab at over 500.  Will just go with sliding scale insulin  and not use extra short acting prior to meals.  Will give Lantus  20 units at night.  Dr. [name]

## 2024-07-19 NOTE — Progress Notes (Signed)
 Patient getting very agitated. Pacing the room, he is verbally using aggressive language and cussing, complaining about everything. He also said he just wanted to give up and die. Dr. Josette notified.  1mg  Ativan  IV ordered and administered to patient.

## 2024-07-19 NOTE — Assessment & Plan Note (Signed)
 IV fluid bolus.  Creatinine 1.29 on disposition.  Recommend checking a BMP with follow-up appointment.  Hold ARB.

## 2024-07-19 NOTE — Assessment & Plan Note (Signed)
 Secondary to high sugar

## 2024-07-19 NOTE — Plan of Care (Signed)
 This patient has been agitated all night, CIWA protocol maintained given ativan  per score. Complained of nausea no vomiting was given zofran . RN offered to complete dressing change this morning patient stated I will do my own dressing supplies are at the bedside. The patient was left sitting in recliner, sitter at bedside.   Problem: Skin Integrity: Goal: Risk for impaired skin integrity will decrease Outcome: Progressing   Problem: Clinical Measurements: Goal: Ability to maintain clinical measurements within normal limits will improve Outcome: Progressing Goal: Will remain free from infection Outcome: Progressing Goal: Diagnostic test results will improve Outcome: Progressing Goal: Respiratory complications will improve Outcome: Progressing Goal: Cardiovascular complication will be avoided Outcome: Progressing

## 2024-07-20 ENCOUNTER — Telehealth (HOSPITAL_COMMUNITY): Payer: Self-pay | Admitting: Pharmacy Technician

## 2024-07-20 DIAGNOSIS — M25561 Pain in right knee: Secondary | ICD-10-CM | POA: Diagnosis not present

## 2024-07-20 DIAGNOSIS — R7881 Bacteremia: Secondary | ICD-10-CM | POA: Diagnosis not present

## 2024-07-20 DIAGNOSIS — E11628 Type 2 diabetes mellitus with other skin complications: Secondary | ICD-10-CM | POA: Diagnosis not present

## 2024-07-20 DIAGNOSIS — E875 Hyperkalemia: Secondary | ICD-10-CM | POA: Diagnosis not present

## 2024-07-20 LAB — BASIC METABOLIC PANEL WITH GFR
Anion gap: 13 (ref 5–15)
BUN: 22 mg/dL — ABNORMAL HIGH (ref 6–20)
CO2: 21 mmol/L — ABNORMAL LOW (ref 22–32)
Calcium: 9.1 mg/dL (ref 8.9–10.3)
Chloride: 98 mmol/L (ref 98–111)
Creatinine, Ser: 1.29 mg/dL — ABNORMAL HIGH (ref 0.61–1.24)
GFR, Estimated: >60 mL/min (ref >60)
Glucose, Bld: 383 mg/dL — ABNORMAL HIGH (ref 70–99)
Potassium: 5.2 mmol/L — ABNORMAL HIGH (ref 3.5–5.1)
Sodium: 132 mmol/L — ABNORMAL LOW (ref 135–145)

## 2024-07-20 LAB — CBC
HCT: 32 % — ABNORMAL LOW (ref 39.0–52.0)
Hemoglobin: 10.8 g/dL — ABNORMAL LOW (ref 13.0–17.0)
MCH: 34.3 pg — ABNORMAL HIGH (ref 26.0–34.0)
MCHC: 33.8 g/dL (ref 30.0–36.0)
MCV: 101.6 fL — ABNORMAL HIGH (ref 80.0–100.0)
Platelets: 182 K/uL (ref 150–400)
RBC: 3.15 MIL/uL — ABNORMAL LOW (ref 4.22–5.81)
RDW: 12.5 % (ref 11.5–15.5)
WBC: 8.6 K/uL (ref 4.0–10.5)
nRBC: 0 % (ref 0.0–0.2)

## 2024-07-20 LAB — GLUCOSE, CAPILLARY
Glucose-Capillary: 370 mg/dL — ABNORMAL HIGH (ref 70–99)
Glucose-Capillary: 426 mg/dL — ABNORMAL HIGH (ref 70–99)
Glucose-Capillary: 313 mg/dL — ABNORMAL HIGH (ref 70–99)
Glucose-Capillary: 319 mg/dL — ABNORMAL HIGH (ref 70–99)
Glucose-Capillary: 131 mg/dL — ABNORMAL HIGH (ref 70–99)

## 2024-07-20 LAB — CULTURE, BLOOD (ROUTINE X 2): Culture: NO GROWTH

## 2024-07-20 MED ORDER — SODIUM CHLORIDE 0.9 % IV BOLUS
500.0000 mL | Freq: Once | INTRAVENOUS | Status: AC
  Administered 2024-07-20: 500 mL via INTRAVENOUS

## 2024-07-20 MED ORDER — FOLIC ACID 1 MG PO TABS: 1.0000 mg | ORAL_TABLET | Freq: Every day | ORAL | 0 refills | Status: DC

## 2024-07-20 MED ORDER — DAPTOMYCIN-SODIUM CHLORIDE 700-0.9 MG/100ML-% IV SOLN
10.0000 mg/kg | INTRAVENOUS | Status: AC
  Administered 2024-07-20: 700 mg via INTRAVENOUS
  Filled 2024-07-20: qty 100

## 2024-07-20 MED ORDER — SODIUM ZIRCONIUM CYCLOSILICATE 10 G PO PACK
10.0000 g | PACK | Freq: Once | ORAL | Status: AC
  Administered 2024-07-20: 10 g via ORAL
  Filled 2024-07-20: qty 1

## 2024-07-20 MED ORDER — HYDROCHLOROTHIAZIDE 12.5 MG PO TABS: 12.5000 mg | ORAL_TABLET | Freq: Every day | ORAL | 0 refills | Status: DC

## 2024-07-20 MED ORDER — VITAMIN B-1 100 MG PO TABS: 100.0000 mg | ORAL_TABLET | Freq: Every day | ORAL | 0 refills | Status: DC

## 2024-07-20 MED ORDER — HYDROCHLOROTHIAZIDE 12.5 MG PO TABS
12.5000 mg | ORAL_TABLET | Freq: Every day | ORAL | Status: DC
  Administered 2024-07-20: 12.5 mg via ORAL
  Filled 2024-07-20: qty 1

## 2024-07-20 NOTE — Consult Note (Signed)
 [facility] Psychiatric Consult Initial  Patient Name: .Andrew Buchanan  MRN: [id number]  DOB: 1971-01-11  Consult Order details:  Orders (From admission, onward)     Start     Ordered   07/20/24 0901  IP CONSULT TO PSYCHIATRY       Comments: Dr Josette savoy  Ordering Provider: Josette Ade, MD  Provider:  Donnelly Mellow, MD  Question Answer Comment  Location [facility]   Reason for Consult? under ivc      07/20/24 0901   07/15/24 0948  IP CONSULT TO PSYCHIATRY       Ordering Provider: Dicky Anes, MD  Provider:  (Not yet assigned)  Question Answer Comment  Reason for consult: Other (see comments)   Comments: agitation, aggressive behavior, eval for medical decision making capacity and treatment recommendations for agitated behaviors      07/15/24 0948             Mode of Visit: In person    Psychiatry Consult Evaluation  Service Date: July 20, 2024 LOS:  LOS: 4 days  Chief Complaint I'm ready to go home  Primary Psychiatric Diagnoses  Alcohol Intoxication Delirium    Assessment  Andrew Buchanan is a 53 y.o. male admitted: Medically   Psychiatry was consulted due to patient being placed on IVC while in the emergency department. Patient was placed on IVC in emergency department due to being intoxicated and attempting to leave facility.   On assessment today, patient is alert and oriented x 4.  He cannot accurately recall the events that led to his admission to the hospital.  He was able to discuss with me plan for his outpatient follow-up with his primary care doctor as well as the need to wait to discuss treatment plan with infectious disease team that would be coming by shortly.  He denied any suicidal or homicidal ideation.  He denied any auditory or visual hallucinations. On current presentation there was no evidence of psychosis or mania and patient did not appear to be responding to internal stimuli. At this time,  patient does not appear to be a risk to self or others.While future psychiatric events cannot be accurately predicted, the patient does not currently require acute inpatient psychiatric care and does not currently meet [place]  involuntary commitment criteria. At this time, IVC was rescinded by psychiatry team.   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 Reported she is managing his medications for anxiety.  ## Medical Decision Making Capacity: Not specifically addressed in this encounter  ## Further Work-up:   -- most recent EKG on 07/15/2024 had QtC of 457 -- Pertinent labwork reviewed earlier this admission  includes: ethanol, cbc, cmp   ## Disposition:-- There are no psychiatric contraindications to discharge at this time  ## Behavioral / Environmental: - No specific recommendations at this time.     ## Safety and Observation Level:  - Based on my clinical evaluation, I estimate the patient to be at low risk of self harm in the current setting. - At this time, we recommend  routine. This decision is based on my review of the chart including patient's history and current presentation, interview of the  patient, mental status examination, and consideration of suicide risk including evaluating suicidal ideation, plan, intent, suicidal or self-harm behaviors, risk factors, and protective factors. This judgment is based on our ability to directly address suicide risk, implement suicide prevention strategies, and develop a safety plan while the patient is in the clinical setting. Please contact our team if there is a concern that risk level has changed.  CSSR Risk Category:C-SSRS RISK CATEGORY: No Risk  Suicide Risk Assessment: Patient has following modifiable risk factors for suicide: lack of access to outpatient mental health resources, which we are addressing by patient wishes to continue follow up with PCP provider for management of anxiety. Patient declined outpatient resources for outpatient detox programs. Patient has following non-modifiable or demographic risk factors for suicide: male gender Patient has the following protective factors against suicide: Supportive friends, no history of suicide attempts, and no history of NSSIB  Thank you for this consult request. Recommendations have been communicated to the primary team.  We will sign off at this time.   Zelda Sharps, NP        History of Present Illness  Relevant Aspects of [facility]   Patient Report:  On assessment today, patient is alert and oriented x 4.  He cannot accurately recall the events that led to his admission to the hospital.  He was able to discuss with me plan for his outpatient follow-up with his primary care doctor as well as the need to wait to discuss treatment plan with infectious disease team that would be coming by shortly.  He denied any suicidal or homicidal ideation.  He denied any auditory or visual hallucinations. On current presentation there was no evidence of psychosis or mania and patient did not appear to be responding to internal stimuli. At this time, patient does not appear to be a risk to self or  others.While future psychiatric events cannot be accurately predicted, the patient does not currently require acute inpatient psychiatric care and does not currently meet [place]  involuntary commitment criteria. At this time, IVC was rescinded by psychiatry team.   Patient reported history of being diagnosed with anxiety.  We discussed bipolar disorder diagnosis listed in patient chart review of notes, patient reported he was never diagnosed with bipolar and was questioning where this diagnosis originated from.  Patient denied any inpatient psychiatric admissions or any history of mania.  Upon chart review, this provider could not find any documented history for visits for bipolar disorder or any history of any inpatient psychiatric admissions.  Patient reported feeling anxious currently, primarily due to feeling like he was going to be stuck in the hospital.  He reported he currently lives alone, but that his significant other is moving in this week.  Patient was able to accurately recall events leading to being brought to the emergency department.  Patient reported he had been drinking margaritas and that when he went home to check his sugar it was so high that the  meter would not read it.  He reported administering 10 units of insulin  in his belly for his high blood sugar and attempting to recheck his blood sugar at home.  He reported the reading still was reading so high that his glucometer would not register, so he called 911.  Per chart review patient does have noted history of previous admissions for DKA.  Patient denied any access to any weapons or firearms.  He reported his primary care doctor is currently managing his anxiety which he takes Klonopin  twice a day for at home.  He denied any feelings of depression, worthlessness, or feelings of being better off dead.  He reported he is not currently employed, but received a large sum of money from his deceased father so he is financially  stable.  He denied any current legal charges or upcoming court dates.  He did report trouble sleeping while in the hospital.  He reported only getting about 3 hours of sleep at night here.  He reported at home he has no trouble sleeping and gets around 6-8 hours of sleep each night.  He reported good appetite at home and while hospitalized.  Patient endorsed alcohol use and reported he is social drinker. He denied ever drinking alcohol in an attempt to kill himself.  He reported he typically likes to drink liquor drinks and does not like to drink beer.  He denied any history of alcohol withdrawal or seizures.  He denied any use of any other illicit drugs.  He did endorse vaping nicotine daily, but declined any nicotine replacement therapy at this time.  He did report strained relationship with his family members.  He reported his sister was recently released from prison.  He reported he does have a local nephew, but reports the family fights a lot over the assets left behind by his deceased father.  He reported his mother has fragile health as well.  He did report his family does help him go to his doctors appointments as he does not currently have a driver's license.  He reported getting along well enough with the family members that he is able to make most all of his doctors appointments.  He reported he is followed by Dr. [name].  He declined being established with any mental health providers as he reported he prefers his primary care doctor to manage his current anxiety.  His mother did voice concerns about patient's drinking and verbal aggression that occurs in the context of alcohol intoxication.  Patient reported he has been doing better about not drinking as much and declined any resources for detox services at this time.  His mother declined any safety concerns that patient would harm himself or others.  Her only concern was due to his verbal aggression that occurs in the context of alcohol  intoxication.     Psych ROS:  Depression: Denied Anxiety:  Endorsed high anxiety while in the hospital Mania (lifetime and current): Denied- no noted history currently available in chart review Psychosis: (lifetime and current): Denied  Collateral information:  Contacted patient's mother at number listed in chart on 07/20/2024    Psychiatric and Social History  Psychiatric History:  Information collected from Patient/chart review  Prev Dx/Sx: Anxiety Current Psych Provider: None Home Meds (current): Clonazepam  1mg  2 times daily Previous Med Trials: Unknown Therapy: Denied  Prior Psych Hospitalization: Denied  Prior Self Harm: Denied Prior Violence: Denied  Family Psych History: Denied Family Hx suicide: Denied  Social History:  Educational Hx: Unknown Occupational Hx: Unemployed Legal Hx: Denied current charges. Reported history of DUI Living Situation: Alone- significant other plans to move in, per patient report  Access to weapons/lethal means: Denied   Substance History Alcohol: Reported social drinker  Type of alcohol Liquor mixed drinks Last Drink Prior to admission to hospital Number of drinks per day 2-3 when he does drink History of alcohol withdrawal seizures Denied History of DT's Denied Tobacco: Vapes daily Illicit drugs: Denied Prescription drug abuse: Denied Rehab hx: Denied  Exam Findings  Physical Exam: MD note reviewed Vital Signs:  Temp:  [97.5 F (36.4 C)-98 F (36.7 C)] 97.9 F (36.6 C) (10/13 0710) Pulse Rate:  [72-93] 93 (10/13 0710) Resp:  [20] 20 (10/13 0710) BP: (144-154)/(97-100) 144/100 (10/13 0710) SpO2:  [99 %-100 %] 100 % (10/13 0710) Blood pressure (!) 144/100, pulse 93, temperature 97.9 F (36.6 C), temperature source Oral, resp. rate 20, height 6' (1.829 m), weight 70.5 kg, SpO2 100%. Body mass index is 21.08 kg/m.    Mental Status Exam: General Appearance: Casual  Orientation:  Full (Time, Place, and Person)   Memory:  Immediate;   Fair Recent;   Fair Remote;   Fair  Concentration:  Concentration: Fair and Attention Span: Fair  Recall:  Fair  Attention  Fair  Eye Contact:  Good  Speech:  Clear and Coherent  Language:  Good  Volume:  Increased  Mood: Ready to go home  Affect:  Appropriate and Congruent  Thought Process:  Coherent and Goal Directed  Thought Content:  WDL  Suicidal Thoughts:  No  Homicidal Thoughts:  No  Judgement:  Fair  Insight:  Lacking  Psychomotor Activity:  Normal  Akathisia:  No  Fund of Knowledge:  Fair      Assets:  [employer]  Cognition:  WNL  ADL's:  Intact  AIMS (if indicated):        Other History   These have been pulled in through the EMR, reviewed, and updated if appropriate.  Family History:  The patient's family history includes Alcoholism in his father.  Medical History: Past Medical History:  Diagnosis Date   AKI (acute kidney injury) 01/08/2021   Alcohol abuse    Anxiety    Cellulitis and abscess of neck 06/10/2024   Diabetes mellitus without complication (HCC)    Diabetic foot infection (HCC) 11/20/2023   DKA (diabetic ketoacidosis) (HCC) 05/19/2020   DKA, type 2 (HCC) 06/09/2024   History of MRSA infection 12/08/2023   11/2023 Diabetic foot wound requiring surgical debridement, cultures MRSA+     Hyperosmolar hyperglycemic state (HHS) (HCC) 01/05/2021   Hypertension    Pancreatitis    Sepsis with acute renal failure without septic shock (HCC)    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   Added automatically from request for surgery [id number]      Surgical History: Past Surgical History:  Procedure Laterality Date   HERNIA REPAIR     ROTATOR CUFF REPAIR Left      Medications:   Current Facility-Administered Medications:    oxyCODONE  (Oxy IR/ROXICODONE ) immediate release tablet 10 mg, 10 mg, Oral, Q4H PRN, 10 mg at 07/20/24 1015 **AND** acetaminophen  (TYLENOL ) tablet  325 mg, 325 mg, Oral, Q4H PRN, Josette, Richard, MD, 325 mg at 07/20/24 0147   acetaminophen  (TYLENOL ) tablet 650 mg, 650 mg, Oral, Q6H PRN, Josette Ade, MD, 650 mg at 07/20/24 0557   clonazePAM  (KLONOPIN ) tablet 1 mg, 1 mg, Oral, TID, Josette Ade, MD, 1  mg at 07/20/24 9193   dextrose  50 % solution 0-50 mL, 0-50 mL, Intravenous, PRN, Melvin, Alexander B, MD   enoxaparin  (LOVENOX ) injection 40 mg, 40 mg, Subcutaneous, Q24H, Melvin, Alexander B, MD, 40 mg at 07/19/24 2139   folic acid  (FOLVITE ) tablet 1 mg, 1 mg, Oral, Daily, Melvin, Alexander B, MD, 1 mg at 07/20/24 9193   gabapentin  (NEURONTIN ) capsule 600 mg, 600 mg, Oral, BID, Wieting, Richard, MD, 600 mg at 07/20/24 9193   hydrochlorothiazide (HYDRODIURIL) tablet 12.5 mg, 12.5 mg, Oral, Daily, Wieting, Richard, MD   insulin  aspart (novoLOG ) injection 0-5 Units, 0-5 Units, Subcutaneous, QHS, Duncan, Hazel V, MD, 2 Units at 07/18/24 2228   insulin  aspart (novoLOG ) injection 0-9 Units, 0-9 Units, Subcutaneous, TID WC, Cleatus Delayne GAILS, MD, 7 Units at 07/20/24 9191   insulin  glargine (LANTUS ) injection 20 Units, 20 Units, Subcutaneous, QHS, Josette Ade, MD, 20 Units at 07/19/24 2141   LORazepam  (ATIVAN ) injection 1 mg, 1 mg, Intravenous, Q12H PRN, Josette Ade, MD, 1 mg at 07/19/24 1331   multivitamin with minerals tablet 1 tablet, 1 tablet, Oral, Daily, Melvin, Alexander B, MD, 1 tablet at 07/20/24 0805   ondansetron  (ZOFRAN ) tablet 4 mg, 4 mg, Oral, Q6H PRN, 4 mg at 07/20/24 0557 **OR** ondansetron  (ZOFRAN ) injection 4 mg, 4 mg, Intravenous, Q6H PRN, Cleatus Delayne GAILS, MD, 4 mg at 07/19/24 0501   Oral care mouth rinse, 15 mL, Mouth Rinse, PRN, Cleatus Delayne GAILS, MD   promethazine  (PHENERGAN ) 12.5 mg in sodium chloride  0.9 % 50 mL IVPB, 12.5 mg, Intravenous, Q6H PRN, Cleatus Delayne GAILS, MD   QUEtiapine  (SEROQUEL ) tablet 12.5 mg, 12.5 mg, Oral, QHS, Wieting, Richard, MD, 12.5 mg at 07/19/24 2137   thiamine  (VITAMIN B1) tablet 100 mg, 100  mg, Oral, Daily, 100 mg at 07/20/24 0806 **OR** thiamine  (VITAMIN B1) injection 100 mg, 100 mg, Intravenous, Daily, Melvin, Alexander B, MD, 100 mg at 07/15/24 1303   vancomycin  (VANCOREADY) IVPB 500 mg/100 mL, 500 mg, Intravenous, Q12H, Tobie Cathaleen RAMAN, RPH, Last Rate: 100 mL/hr at 07/20/24 1014, 500 mg at 07/20/24 1014  Allergies: Allergies  Allergen Reactions   Amlodipine Shortness Of Breath   Antihistamines, Chlorpheniramine-Type Anaphylaxis   Statins Other (See Comments)    Pt declines    Zelda Sharps, NP

## 2024-07-20 NOTE — BH Assessment (Signed)
 IVC PAPERS  RESCINDED PER  ZELDA SHARPS  NP INFORMED  GLENN  WATERS  LPN

## 2024-07-20 NOTE — Inpatient Diabetes Management (Signed)
 Inpatient Diabetes Program Recommendations  AACE/ADA: New Consensus Statement on Inpatient Glycemic Control (2015)  Target Ranges:  Prepandial:   less than 140 mg/dL      Peak postprandial:   less than 180 mg/dL (1-2 hours)      Critically ill patients:  140 - 180 mg/dL   Lab Results  Component Value Date   GLUCAP 319 (H) 07/20/2024   HGBA1C 9.5 (H) 06/10/2024    Review of Glycemic Control  Latest Reference Range & Units 07/19/24 19:32 07/19/24 21:20 07/20/24 01:42 07/20/24 03:11 07/20/24 07:13 07/20/24 09:41  Glucose-Capillary 70 - 99 mg/dL 629 (H) 524 (H) 629 (H) 426 (H) 313 (H) 319 (H)   Diabetes history: DM 2 Outpatient Diabetes medications:  Dexcom G7 Humulin  70/30 18 units bid Current orders for Inpatient glycemic control:  Lantus  20 units daily Novolog  0-9 units tid with meals and HS Inpatient Diabetes Program Recommendations:    Consider adding Novolog  5 units tid with meals (hold if patient eats less than 50% or NPO).   Thanks  Randall Bullocks, RN, BC-ADM Inpatient Diabetes Coordinator Pager [phone]  (8a-5p)

## 2024-07-20 NOTE — Plan of Care (Signed)

## 2024-07-20 NOTE — Discharge Summary (Signed)
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   Enterococcus faecalis NOT DETECTED NOT DETECTED Final   Enterococcus Faecium NOT DETECTED NOT DETECTED Final   Listeria monocytogenes NOT DETECTED NOT DETECTED Final   Staphylococcus species DETECTED (A) NOT DETECTED Final    Comment: CRITICAL RESULT CALLED TO, READ BACK BY AND VERIFIED WITH:  JASON ROBBINS AT 0120 07/16/24 JG    Staphylococcus aureus (BCID) DETECTED (A) NOT DETECTED Final    Comment: CRITICAL RESULT CALLED TO, READ BACK BY AND VERIFIED WITH:  JASON ROBBINS AT 0120 07/16/24 JG    Staphylococcus epidermidis NOT DETECTED NOT DETECTED Final   Staphylococcus lugdunensis NOT DETECTED NOT DETECTED Final   Streptococcus species NOT DETECTED NOT DETECTED Final   Streptococcus agalactiae NOT DETECTED NOT DETECTED Final   Streptococcus pneumoniae NOT DETECTED NOT DETECTED Final   Streptococcus pyogenes NOT DETECTED NOT DETECTED Final   A.calcoaceticus-baumannii NOT DETECTED NOT DETECTED Final   Bacteroides fragilis NOT DETECTED NOT DETECTED Final    Enterobacterales NOT DETECTED NOT DETECTED Final   Enterobacter cloacae complex NOT DETECTED NOT DETECTED Final   Escherichia coli NOT DETECTED NOT DETECTED Final   Klebsiella aerogenes NOT DETECTED NOT DETECTED Final   Klebsiella oxytoca NOT DETECTED NOT DETECTED Final   Klebsiella pneumoniae NOT DETECTED NOT DETECTED Final   Proteus species NOT DETECTED NOT DETECTED Final   Salmonella species NOT DETECTED NOT DETECTED Final   Serratia marcescens NOT DETECTED NOT DETECTED Final   Haemophilus influenzae NOT DETECTED NOT DETECTED Final   Neisseria meningitidis NOT DETECTED NOT DETECTED Final   Pseudomonas aeruginosa NOT DETECTED NOT DETECTED Final   Stenotrophomonas maltophilia NOT DETECTED NOT DETECTED Final   Candida albicans NOT DETECTED NOT DETECTED Final   Candida auris NOT DETECTED NOT DETECTED Final   Candida glabrata NOT DETECTED NOT DETECTED Final   Candida krusei NOT DETECTED NOT DETECTED Final   Candida parapsilosis NOT DETECTED NOT DETECTED Final   Candida tropicalis NOT DETECTED NOT DETECTED Final   Cryptococcus neoformans/gattii NOT DETECTED NOT DETECTED Final   Meth resistant mecA/C and MREJ DETECTED (A) NOT DETECTED Final    Comment: CRITICAL RESULT CALLED TO, READ BACK BY AND VERIFIED WITH:  JASON ROBBINS AT 0120 07/16/24 JG Performed at [facility], [address] [place], [place] [address]   MIC (1 Drug)-     Status: Abnormal (Preliminary result)   Collection Time: 07/15/24  9:13 AM  Result Value Ref Range Status   Min Inhibitory Conc (1 Drug) Preliminary report (A)  Final    Comment: (NOTE) Performed At: [facility] [address] [place], [place] [id number] Jennette Shorter MD Ey:[id number]    Source PENDING  Incomplete  MIC  Result     Status: Abnormal   Collection Time: 07/15/24  9:13 AM  Result Value Ref Range Status   Result 1 (MIC) Comment (A)  Final    Comment: (NOTE) Methicillin - resistant Staphylococcus aureus Identification  performed by account, not confirmed by this laboratory. Performed At: [facility] [address] [place], [place] [id number] Jennette Shorter MD Ey:[id number]   MRSA Next Gen by PCR, Nasal     Status: Abnormal   Collection Time: 07/15/24  4:20 PM   Specimen: Nasal Mucosa; Nasal Swab  Result Value Ref Range Status   MRSA by PCR Next Gen DETECTED (A) NOT DETECTED Final    Comment: RESULT CALLED TO, READ BACK BY AND VERIFIED WITH: EBONI COOPER 2054 07/15/24 MU (NOTE) The GeneXpert MRSA Assay (FDA approved for NASAL specimens only), is one component of a comprehensive MRSA colonization surveillance program. It is not intended to diagnose MRSA infection nor to guide or monitor treatment for MRSA infections. Test performance is not FDA approved in patients less than 6 years old. Performed at [facility], [address] [place], [place] [address]   C Difficile Quick Screen w PCR reflex     Status: None   Collection Time: 07/16/24  1:52 PM   Specimen: STOOL  Result Value Ref Range Status   C Diff antigen NEGATIVE NEGATIVE Final   C Diff toxin NEGATIVE NEGATIVE Final   C Diff interpretation No C. difficile detected.  Final    Comment: Performed at [facility], [address] [place], [place] [address]  Aerobic Culture w Gram Stain (superficial specimen)     Status: None (Preliminary result)   Collection Time: 07/16/24  7:43 PM   Specimen: Wound  Result Value Ref Range Status   Specimen Description WOUND  Final   Special Requests RIGHT FOOT  Final   Gram Stain   Final    FEW WBC PRESENT, PREDOMINANTLY PMN NO ORGANISMS SEEN    Culture   Final    RARE STAPHYLOCOCCUS AUREUS RARE DIPHTHEROIDS(CORYNEBACTERIUM SPECIES) Standardized susceptibility testing for this organism is not available. Performed at [facility], [address], [place], [place] [address]    Report Status PENDING  Incomplete   Organism ID, Bacteria STAPHYLOCOCCUS AUREUS  Final       Susceptibility   Staphylococcus aureus - MIC*    CIPROFLOXACIN >=8 RESISTANT Resistant     ERYTHROMYCIN >=8 RESISTANT Resistant     GENTAMICIN <=0.5 SENSITIVE Sensitive     OXACILLIN >=4 RESISTANT Resistant     TETRACYCLINE <=1 SENSITIVE Sensitive     VANCOMYCIN  <=0.5 SENSITIVE Sensitive     TRIMETH/SULFA >=320 RESISTANT Resistant     CLINDAMYCIN <=0.25 SENSITIVE Sensitive     RIFAMPIN <=0.5 SENSITIVE Sensitive     Inducible Clindamycin NEGATIVE Sensitive     LINEZOLID 2 SENSITIVE Sensitive     * RARE STAPHYLOCOCCUS AUREUS  Culture, blood (Routine X 2) w Reflex to ID Panel     Status: None (Preliminary result)   Collection Time: 07/18/24  6:09 AM   Specimen: BLOOD  Result Value Ref Range Status   Specimen Description BLOOD BLOOD RIGHT ARM  Final   Special Requests   Final    BOTTLES DRAWN AEROBIC AND ANAEROBIC Blood Culture adequate volume   Culture   Final    NO GROWTH 2 DAYS Performed at [facility], [address] [place], [place] [address]    Report Status PENDING  Incomplete  Culture,  blood (Routine X 2) w Reflex to ID Panel     Status: None (Preliminary result)   Collection Time: 07/18/24  6:13 AM   Specimen: BLOOD  Result Value Ref Range Status   Specimen Description BLOOD BLOOD RIGHT ARM  Final   Special Requests   Final    BOTTLES DRAWN AEROBIC AND ANAEROBIC Blood Culture results may not be optimal due to an inadequate volume of blood received in culture bottles   Culture   Final    NO GROWTH 2 DAYS Performed at [facility], [address] [place], [place] [address]    Report Status PENDING  Incomplete    Labs: CBC: Recent Labs  Lab 07/15/24 0913 07/16/24 0323 07/17/24 0720 07/20/24 0601  WBC 7.2 6.8  --  8.6  NEUTROABS  --  4.6  --   --   HGB 11.5* 10.5* 10.2* 10.8*  HCT 34.4* 30.5*  --  32.0*  MCV 103.3* 101.7*  --  101.6*  PLT 364 245  --  182   Basic Metabolic Panel: Recent Labs  Lab 07/16/24 0323 07/17/24 0720  07/18/24 0609 07/19/24 0613 07/20/24 0601  NA 140 135 131* 127* 132*  K 4.3 4.3 4.7 6.1* 5.2*  CL 103 100 99 96* 98  CO2 24 25 26 25  21*  GLUCOSE 99 310* 286* 573* 383*  BUN 12 10 14 19  22*  CREATININE 1.12 1.13 1.14 1.37* 1.29*  CALCIUM  8.1* 7.8* 7.8* 8.0* 9.1   Liver Function Tests: Recent Labs  Lab 07/15/24 0922  AST 355*  ALT 57*  ALKPHOS 100  BILITOT 0.6  PROT 7.1  ALBUMIN 3.2*   CBG: Recent Labs  Lab 07/20/24 0142 07/20/24 0311 07/20/24 0713 07/20/24 0941 07/20/24 1146  GLUCAP 370* 426* 313* 319* 131*    Discharge time spent: greater than 30 minutes.  Signed: [name], MD [facility] 07/20/2024

## 2024-07-20 NOTE — Assessment & Plan Note (Signed)
 Hold ARB.  Prescribed hydrochlorothiazide.

## 2024-07-20 NOTE — Progress Notes (Signed)
 Mobility Specialist - Progress Note    07/20/24 1200  Mobility  Activity Ambulated independently  Level of Assistance Standby assist, set-up cues, supervision of patient - no hands on  Assistive Device Other (Comment) (IV STand)  Distance Ambulated (ft) 200 ft  Range of Motion/Exercises Active  Activity Response Tolerated well  Mobility visit 1 Mobility  Mobility Specialist Start Time (ACUTE ONLY) 1153  Mobility Specialist Stop Time (ACUTE ONLY) 1210  Mobility Specialist Time Calculation (min) (ACUTE ONLY) 17 min   Pt was supine in bed upon entry with a sitter in room. Pt agreed to mobility. Pt is able to EOB independently. Pt was able to STS independently. Pt was a bit jittery. Pt needs constant cues for direction and focus of task. Pt ambulated well with sitter behind as a precaution, but drags his feet a bit. After activity pt returned to the room in the recliner with his needs in reach sitter in the room upon exit.  Clem Rodes Mobility Specialist 07/20/24, 12:30 PM

## 2024-07-20 NOTE — Progress Notes (Addendum)
 Pt refusing to take 5 units of novolog  that was scheduled with his night time medicine. Agreed to only take to 20 units of lantus . At 0142 pt requested to have the tech check his blood sugar. After seeing that his blood sugar was still high, pt was in the room eating cereal and milk. Pt had already had a sandwich box at the beginning of the shift. Pt is constantly asking to have his blood sugar checked and constantly wanting snacks to eat and gets very angry when requests are denied. Dr Shona on call provider notified.

## 2024-07-20 NOTE — Telephone Encounter (Signed)
 Auth Submission: NO AUTH NEEDED Site of care: [facility] Payer: BCBS Medication & CPT/J Code(s) submitted: Dalvance (Dalbavancin) [id number] Diagnosis Code: B95.62  Route of submission (phone, fax, portal):  Phone # Fax # Auth type: Buy/Bill HB Units/visits requested: 1500mg  x 1 dose on 10/14, then 1500mg  x 1 dose on 10/21 Reference number: [id number] Approval from: 07/20/2024 to 10/07/24    Dagoberto Armour, CPhT [facility] Phone: [phone] 07/20/2024

## 2024-07-20 NOTE — Progress Notes (Addendum)
 Date of Admission:  07/15/2024    ID: Andrew Buchanan is a 53 y.o. 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)   Hyperosmolar hyperglycemic state (HHS) (HCC)   HHS (hypothenar hammer syndrome)   Lactic acidosis   Chronic pain syndrome   Abrasion, nose w/o infection   Diabetic ulcer of right midfoot associated with type 2 diabetes mellitus, with fat layer exposed (HCC)   Right knee pain   Hyperkalemia   Hyponatremia    Subjective: Pt is restless and wants to go home- states he will come to day surgery for Iv antibiotic Medications:   clonazePAM   1 mg Oral TID   enoxaparin  (LOVENOX ) injection  40 mg Subcutaneous Q24H   folic acid   1 mg Oral Daily   gabapentin   600 mg Oral BID   hydrochlorothiazide  12.5 mg Oral Daily   insulin  aspart  0-5 Units Subcutaneous QHS   insulin  aspart  0-9 Units Subcutaneous TID WC   insulin  glargine  20 Units Subcutaneous QHS   multivitamin with minerals  1 tablet Oral Daily   QUEtiapine   12.5 mg Oral QHS   thiamine   100 mg Oral Daily   Or   thiamine   100 mg Intravenous Daily    Objective: Vital signs in last 24 hours: Patient Vitals for the past 24 hrs:  BP Temp Temp src Pulse Resp SpO2  07/20/24 0710 (!) 144/100 97.9 F (36.6 C) Oral 93 20 100 %  07/20/24 0515 (!) 147/99 98 F (36.7 C) Oral 72 -- 99 %  07/19/24 1929 (!) 154/97 (!) 97.5 F (36.4 C) -- 90 20 100 %  07/19/24 1415 (!) 144/98 -- -- 88 -- --      PHYSICAL EXAM:  General: Alert, cooperative, no distress, appears stated age.  Lungs: Clear to auscultation bilaterally. No Wheezing or Rhonchi. No rales. Heart: Regular rate and rhythm, no murmur, rub or gallop. Abdomen: Soft,  non-tender,not distended. Bowel sounds normal. No masses Right ring finger swollen and tender Extremities: not examined today Right foot plantar aspect superficial ulcer present    Skin: No rashes or lesions. Or bruising Lymph: Cervical, supraclavicular normal. Neurologic: Grossly non-focal  Lab Results    Latest Ref Rng & Units 07/20/2024    6:01 AM 07/17/2024    7:20 AM 07/16/2024    3:23 AM  CBC  WBC 4.0 - 10.5 K/uL 8.6   6.8   Hemoglobin 13.0 - 17.0 g/dL 89.1  89.7  89.4   Hematocrit 39.0 - 52.0 % 32.0   30.5   Platelets 150 - 400 K/uL 182   245        Latest Ref Rng & Units 07/20/2024    6:01 AM 07/19/2024    6:13 AM 07/18/2024    6:09 AM  CMP  Glucose 70 - 99 mg/dL 616  426  713   BUN 6 - 20 mg/dL 22  19  14    Creatinine 0.61 - 1.24 mg/dL 8.70  8.62  8.85   Sodium 135 - 145 mmol/L 132  127  131   Potassium 3.5 - 5.1 mmol/L 5.2  6.1  4.7   Chloride 98 - 111 mmol/L 98  96  99   CO2 22 - 32 mmol/L 21  25  26    Calcium  8.9 - 10.3 mg/dL 9.1  8.0  7.8       Microbiology: N/A 25 Blood culture 1 set out of 2 has MRSA 07/16/2024 right foot ulcer culture MRSA  Studies/Results: MR KNEE RIGHT W WO CONTRAST Result Date: 07/18/2024 CLINICAL DATA:  Right knee pain and redness. Concern for septic arthritis. EXAM: MRI OF THE RIGHT KNEE WITHOUT AND WITH CONTRAST TECHNIQUE: Multiplanar, multisequence MR imaging of the right knee was performed both before and after administration of intravenous contrast. CONTRAST:  7mL GADAVIST GADOBUTROL 1 MMOL/ML IV SOLN COMPARISON:  None Available. FINDINGS: MENISCI Medial: Focal horizontal undersurface tear at the posterior horn of the medial meniscus. There is approximately 3 mm of extrusion into the medial gutter. Lateral: Intact. LIGAMENTS Cruciates: ACL and PCL are intact. Collaterals: Medial collateral ligament is intact. Lateral collateral ligament complex is intact. CARTILAGE Patellofemoral:  No chondral defect. Medial: Mild fibrillation of  the medial patellofemoral compartment articular cartilage. Lateral:  No chondral defect. JOINT: Trace knee joint effusion without evidence of synovitis. Edema and enhancement within the superolateral aspect of Hoffa's fat pad. No loculated collection. POPLITEAL FOSSA: Popliteus tendon is intact. No Baker's cyst. EXTENSOR MECHANISM: Intact quadriceps tendon. Thickening of the distal patellar tendon, suggestive of tendinosis. Intact lateral patellar retinaculum. Intact medial patellar retinaculum. BONES: No fracture or dislocation. Mild focal edema at the peripheral margin of the medial tibial plateau. No marrow signal abnormality identified to suggest osteomyelitis or septic arthritis. Other: No fluid collection or hematoma. Muscles are normal. IMPRESSION: 1. No evidence of osteomyelitis or septic arthritis. 2. Edema and enhancement in the superolateral aspect of Hoffa's fat pad is most compatible with focal inflammatory change. Fat pad impingement can have a similar appearance. No loculated fluid collection. 3.   Trace knee joint effusion. 4. Focal horizontal undersurface tear of the posterior horn of the medial meniscus with 3 mm of extrusion into the medial gutter. Mild focal edema of the underlying peripheral margin of the medial tibial plateau. 5. Thickening of the distal patellar tendon is suggestive of tendinosis. Electronically Signed   By: Harrietta Sherry M.D.   On: 07/18/2024 19:42     Assessment/Plan: MRSA bacteremia 1 set of 2  source is the rt foot  ulcer Right foot ulcer with underlying diabetes mellitus with peripheral neuropathy MRI of the foot shows no findings of osteomyelitis. Patient is currently on vancomycin  Repeat blood cultures negative 2D echo has been done is a technically difficult study due to poor echo windows The aortic valve was not well-visualized Tricuspid valve was not visualized the mitral valve was grossly normal Patient will need a TEE but he refused He wanted to  go home and is saying he will leave AMA We discussed antibiotics for MRSA bacteremia He cannot be sent home with PICC line So will do weekly dalbavancin X2 He does not want to come to day surgery for any other frequency  Rt knee pain- no swellign , no erythema -full range of movt No evidence of Septic arthritis clincially  Diabetes mellitus with peripheral neuropathy Poorly controlled due to patient's noncompliance with food and alcohol  EtOH abuse  Falls due to EtOH abuse- management as per hospitalist  Delirium tremens and alcohol withdrawal is resolved  Anemia  Past history of right foot surgery and MRSA in the wound culture before  Discussed with day surgery - He will go there tomorrow for his first  dose of 1500mg  of Dalbavancin and will get 2nd of 1500mg  after 7 days As he is leaving AMA and dose not want to wait for evening dose of vanco- will give a dose of daptomycin prefilled bag of 700mg   Discussed the management with the patient, ID pharmacist, hospitalist and Day surgery  Will follow as OP

## 2024-07-20 NOTE — Progress Notes (Signed)
 Pt declines wheelchair at discharge, dc instructions reviewed with pt and friend, no questions, states understanding, aware that his infusion in scheduled tomorrow at 1400

## 2024-07-20 NOTE — Progress Notes (Signed)
 1:1 sitter relieved for lunch. Patient currently OOB to chair. Psych NP at bedside talking to patient. Patient denies any SI/HI today.

## 2024-07-21 ENCOUNTER — Ambulatory Visit

## 2024-07-21 LAB — AEROBIC CULTURE W GRAM STAIN (SUPERFICIAL SPECIMEN)

## 2024-07-21 LAB — HEPATITIS PANEL, ACUTE: Hep A IgM: NEGATIVE — AB

## 2024-07-22 LAB — MIC RESULT

## 2024-07-23 ENCOUNTER — Encounter: Payer: Self-pay | Admitting: Infectious Diseases

## 2024-07-23 ENCOUNTER — Telehealth: Payer: Self-pay | Admitting: Infectious Diseases

## 2024-07-23 ENCOUNTER — Other Ambulatory Visit: Payer: Self-pay | Admitting: Infectious Diseases

## 2024-07-23 ENCOUNTER — Ambulatory Visit: Admission: RE | Admit: 2024-07-23 | Source: Ambulatory Visit

## 2024-07-23 DIAGNOSIS — R7881 Bacteremia: Secondary | ICD-10-CM

## 2024-07-23 LAB — CULTURE, BLOOD (ROUTINE X 2)
Culture: NO GROWTH
Culture: NO GROWTH
Special Requests: ADEQUATE

## 2024-07-23 MED ORDER — LINEZOLID 600 MG PO TABS: 600.0000 mg | ORAL_TABLET | Freq: Two times a day (BID) | ORAL | 0 refills | Status: DC

## 2024-07-23 NOTE — Telephone Encounter (Signed)
 I called patient yesterday but couldn't reach him- Called him today and was able to talk to him--HE has no voice mail set up and hence he said cannot get any messages and also his phone will ring only twice and he has no way of knowing who called him  HE has not come to the day surgery to get IV dlabavancin for MRSA bacteremia amd MRSA foot ulcer on 07/21/24. He called them and set the appt for today but canceled that today. Pt has many reasons not to go to the infusion center 1) Diarrhea 2) No transport     He did  go to [facility] to collect his meds though yesterday  As patient has disruption in his treatment high risk for recurrence of bacteremia As he is unable to come for IV infusion, And also patient requested oral antibiotic will give Linezolid 600mg  PO BID x 2 Weeks Sent it to [facility] and they have it with no copay Explained side effects to patient- food and drinks to avoid ( vinegra products, fermented cheese m, red wine) Also medications like SSRI which he is not on Crystal meth- he does not do it he staes No amphetamine or pseudoephedrine products  Pt need to get labs in 1 week  cbc with diff and cmp I gave him my contact number to call [phone] if he has any issues with linezolid or unable to get his blood work He has an appt with a new PCP tomorrow He has an appt with me on 08/04/24

## 2024-07-24 LAB — CULTURE, BLOOD (ROUTINE X 2)

## 2024-07-28 ENCOUNTER — Telehealth: Payer: Self-pay

## 2024-07-28 NOTE — Telephone Encounter (Signed)
 Patient called requesting to speak with Dr.Ravishankar -- patient stated that Linezolid is causing nausea and dizziness.   Andy Moye SHAUNNA Letters, CMA

## 2024-07-30 ENCOUNTER — Telehealth: Payer: Self-pay

## 2024-07-30 ENCOUNTER — Other Ambulatory Visit: Payer: Self-pay

## 2024-07-30 MED ORDER — SULFAMETHOXAZOLE-TRIMETHOPRIM 800-160 MG PO TABS: 1.0000 | ORAL_TABLET | Freq: Two times a day (BID) | ORAL | 0 refills | Status: DC

## 2024-07-30 NOTE — Telephone Encounter (Signed)
 Per Dr. Fayette patient can stop linezolid and start Bactrim. Patient verbalized understanding. Patient unable to take IV dalbavancin at same day surgery due to no transportation. RX sent to the pharmacy Andrew Buchanan ONEIDA Ligas, CMA

## 2024-07-30 NOTE — Telephone Encounter (Signed)
 Patient is having side effect with Linezolid, staff conference Dr. Fayette on the call to discuss and new plan in place to D/C Linezolid and start Bactrim DS BID for 30 days. Patent verbalized understanding and will contact the pharmacy to see when medication will be ready.   Enis Kleine, LPN

## 2024-08-04 ENCOUNTER — Ambulatory Visit: Admitting: Infectious Diseases

## 2024-09-09 NOTE — Telephone Encounter (Signed)
 I spoke to [facility] and arranged an appointment. Please add him to my schedule for 11:40am Tuesday 12/16. Thanks!

## 2024-09-12 NOTE — Telephone Encounter (Signed)
 Upcoming Appt: Future Appointments  Date Time Provider Department Center  09/22/2024 11:40 AM Missy Mallie Raring, MD [facility]  11/16/2024  3:40 PM Missy Mallie Raring, MD [facility]    Disposition: Call PCP When Office is Open  Triage documentation reviewed and Disposition present? yes  Encounter Reason for Disposition: .  Caller requesting a CONTROLLED substance prescription refill (e.g., narcotics, ADHD medicines) Patient currently has medication, asking about refill.   Is this a pediatric patient?  No   Any recent, relevant visit?  No  Any relevant medical history?  Yes   What history?   Any interventions?  No   [facility] Eligibility  Patient is eligible for [facility] (according to most recent zip code and insurance information)?  No  Patient is [facility] eligible and appropriate for referral: No   Encounter Initial Assessment: 1. DRUG NAME: What medicine do you need to have refilled?      oxyCODONE -acetaminophen  (PERCOCET) 10-325 mg per tablet  diazePAM  (VALIUM ) 10 MG tablet   2. REFILLS REMAINING: How many refills are remaining? Notes: The label on the medicine or pill bottle will show how many refills are remaining. If there are no refills remaining, then a renewal may be needed.     0 3. EXPIRATION DATE: What is the expiration date? Note: The label states when the prescription will expire, and thus can no longer be refilled.)      N/A 4. PRESCRIBER: Who prescribed it? Note: The prescribing doctor or group is responsible for refill approvals.SABRA Missy, Mallie Raring, MD 5. PHARMACY: Have you contacted your pharmacy (drugstore)? Note: Some pharmacies will contact the doctor (or NP/PA).      [id number] - [place] ([place]), [place] - [address] 6. SYMPTOMS: Do you have any symptoms?      Patient  did not report any symptoms currently has medication  7. PREGNANCY: Is there any  chance that you are pregnant? When was your last menstrual period?     N/A   Encounter Protocols Used: Medication Refill and Renewal Call-A-AH

## 2024-09-13 ENCOUNTER — Emergency Department
Admission: EM | Admit: 2024-09-13 | Discharge: 2024-09-14 | Disposition: A | Source: Intra-hospital | Attending: Emergency Medicine | Admitting: Emergency Medicine

## 2024-09-13 ENCOUNTER — Other Ambulatory Visit: Payer: Self-pay

## 2024-09-13 DIAGNOSIS — F419 Anxiety disorder, unspecified: Secondary | ICD-10-CM | POA: Diagnosis not present

## 2024-09-13 DIAGNOSIS — F1010 Alcohol abuse, uncomplicated: Secondary | ICD-10-CM

## 2024-09-13 DIAGNOSIS — Z9189 Other specified personal risk factors, not elsewhere classified: Secondary | ICD-10-CM

## 2024-09-13 DIAGNOSIS — F32A Depression, unspecified: Secondary | ICD-10-CM

## 2024-09-13 DIAGNOSIS — Z046 Encounter for general psychiatric examination, requested by authority: Secondary | ICD-10-CM

## 2024-09-13 LAB — COMPREHENSIVE METABOLIC PANEL WITH GFR
ALT: 60 U/L — ABNORMAL HIGH (ref 0–44)
AST: 186 U/L — ABNORMAL HIGH (ref 15–41)
Albumin: 4.2 g/dL (ref 3.5–5.0)
Alkaline Phosphatase: 166 U/L — ABNORMAL HIGH (ref 38–126)
Anion gap: 18 — ABNORMAL HIGH (ref 5–15)
BUN: 11 mg/dL (ref 6–20)
CO2: 22 mmol/L (ref 22–32)
Calcium: 8.7 mg/dL — ABNORMAL LOW (ref 8.9–10.3)
Chloride: 102 mmol/L (ref 98–111)
Creatinine, Ser: 1.15 mg/dL (ref 0.61–1.24)
GFR, Estimated: >60 mL/min (ref >60)
Glucose, Bld: 332 mg/dL — ABNORMAL HIGH (ref 70–99)
Potassium: 4.1 mmol/L (ref 3.5–5.1)
Sodium: 142 mmol/L (ref 135–145)
Total Bilirubin: 0.5 mg/dL (ref 0.0–1.2)
Total Protein: 8.8 g/dL — ABNORMAL HIGH (ref 6.5–8.1)

## 2024-09-13 LAB — CBC
HCT: 40.3 % (ref 39.0–52.0)
Hemoglobin: 13.8 g/dL (ref 13.0–17.0)
MCH: 35.3 pg — ABNORMAL HIGH (ref 26.0–34.0)
MCHC: 34.2 g/dL (ref 30.0–36.0)
MCV: 103.1 fL — ABNORMAL HIGH (ref 80.0–100.0)
Platelets: 114 K/uL — ABNORMAL LOW (ref 150–400)
RBC: 3.91 MIL/uL — ABNORMAL LOW (ref 4.22–5.81)
RDW: 18.6 % — ABNORMAL HIGH (ref 11.5–15.5)
WBC: 8.2 K/uL (ref 4.0–10.5)
nRBC: 0 % (ref 0.0–0.2)

## 2024-09-13 LAB — ETHANOL: Alcohol, Ethyl (B): 308 mg/dL (ref <15)

## 2024-09-13 LAB — CBG MONITORING, ED: Glucose-Capillary: 277 mg/dL — ABNORMAL HIGH (ref 70–99)

## 2024-09-13 LAB — ACETAMINOPHEN LEVEL: Acetaminophen (Tylenol), Serum: <10 ug/mL — ABNORMAL LOW (ref 10–30)

## 2024-09-13 MED ORDER — LORAZEPAM 2 MG/ML IJ SOLN: 1.0000 mg | INTRAMUSCULAR | Status: DC | PRN

## 2024-09-13 MED ORDER — OLANZAPINE 5 MG PO TABS: 5.0000 mg | ORAL_TABLET | Freq: Four times a day (QID) | ORAL | Status: DC | PRN

## 2024-09-13 MED ORDER — THIAMINE HCL 100 MG/ML IJ SOLN: 100.0000 mg | Freq: Every day | INTRAMUSCULAR | Status: DC

## 2024-09-13 MED ORDER — GABAPENTIN 300 MG PO CAPS
600.0000 mg | ORAL_CAPSULE | Freq: Two times a day (BID) | ORAL | Status: DC
  Administered 2024-09-13: 600 mg via ORAL
  Filled 2024-09-13: qty 2

## 2024-09-13 MED ORDER — BENZTROPINE MESYLATE 1 MG/ML IJ SOLN: 1.0000 mg | Freq: Two times a day (BID) | INTRAMUSCULAR | Status: DC | PRN

## 2024-09-13 MED ORDER — OLANZAPINE 10 MG IM SOLR: 5.0000 mg | Freq: Four times a day (QID) | INTRAMUSCULAR | Status: DC | PRN

## 2024-09-13 MED ORDER — ADULT MULTIVITAMIN W/MINERALS CH
1.0000 | ORAL_TABLET | Freq: Every day | ORAL | Status: DC
  Administered 2024-09-13: 1 via ORAL
  Filled 2024-09-13: qty 1

## 2024-09-13 MED ORDER — INSULIN ASPART PROT & ASPART (70-30 MIX) 100 UNIT/ML ~~LOC~~ SUSP
10.0000 [IU] | Freq: Two times a day (BID) | SUBCUTANEOUS | Status: DC
  Filled 2024-09-13: qty 10

## 2024-09-13 MED ORDER — THIAMINE MONONITRATE 100 MG PO TABS
100.0000 mg | ORAL_TABLET | Freq: Every day | ORAL | Status: DC
  Administered 2024-09-13: 100 mg via ORAL
  Filled 2024-09-13: qty 1

## 2024-09-13 MED ORDER — DIAZEPAM 5 MG PO TABS
5.0000 mg | ORAL_TABLET | Freq: Two times a day (BID) | ORAL | Status: DC | PRN
  Administered 2024-09-13: 5 mg via ORAL
  Filled 2024-09-13: qty 1

## 2024-09-13 MED ORDER — BENZTROPINE MESYLATE 1 MG PO TABS: 1.0000 mg | ORAL_TABLET | Freq: Two times a day (BID) | ORAL | Status: DC | PRN

## 2024-09-13 MED ORDER — LORAZEPAM 1 MG PO TABS
1.0000 mg | ORAL_TABLET | ORAL | Status: DC | PRN
  Administered 2024-09-13: 2 mg via ORAL
  Administered 2024-09-14: 1 mg via ORAL
  Filled 2024-09-13: qty 1
  Filled 2024-09-13: qty 2

## 2024-09-13 MED ORDER — OXYCODONE HCL 5 MG PO TABS
5.0000 mg | ORAL_TABLET | Freq: Three times a day (TID) | ORAL | Status: DC | PRN
  Administered 2024-09-13 – 2024-09-14 (×2): 5 mg via ORAL
  Filled 2024-09-13 (×2): qty 1

## 2024-09-13 MED ORDER — INSULIN ASPART 100 UNIT/ML IJ SOLN
0.0000 [IU] | Freq: Three times a day (TID) | INTRAMUSCULAR | Status: DC
  Administered 2024-09-13: 5 [IU] via SUBCUTANEOUS
  Filled 2024-09-13: qty 5

## 2024-09-13 MED ORDER — FOLIC ACID 1 MG PO TABS
1.0000 mg | ORAL_TABLET | Freq: Every day | ORAL | Status: DC
  Administered 2024-09-13: 1 mg via ORAL
  Filled 2024-09-13: qty 1

## 2024-09-13 NOTE — Discharge Instructions (Signed)

## 2024-09-13 NOTE — ED Triage Notes (Signed)
 Pt comes in via [facility] under IVC due to history of bipolar disorder, non compliance with medications, abuse of pain medications with alcohol, and suicidal ideations with plans to shoot himself according to family member. Pt denies all allegations for IVC. Pt has complaints of pain but will not disclose where. Pt denies SI/HI

## 2024-09-13 NOTE — Consult Note (Addendum)
 [facility] Consult Note  Patient Name: Andrew Buchanan MRN: [id number] DOB: [birth year] DATE OF Consult: 09/13/2024  PRIMARY PSYCHIATRIC DIAGNOSES Unspecified depressive disorder; Unspecified anxiety disorder; Rule out substance induced depressive disorder; Rule out substance induced anxiety disorder; Polysubstance use disorder; Rule out delirium due to general medical condition.   Based on my current evaluation and assessment of the patient, he is a 53 y.o. male who presents on an IVC given concern that patient poses a risk to self. Given that patient's history greatly contrasts with that provided by mother, there is concern that patient is either minimizing or misrepresenting the severity of his mental health symptoms. Moreover, collateral from mother indicates that patient is at high risk to self or others given threats to buy a gun and harm himself and others. The patient's presentation is consistent with Unspecified depressive disorder; Unspecified anxiety disorder; Rule out substance induced depressive disorder; Rule out substance induced anxiety disorder; Polysubstance use disorder; Rule out delirium due to general medical condition. Therefore, patient does meet criteria for an intensive inpatient psychiatric hospitalization.  RECOMMENDATIONS  Inpatient psychiatric admission recommended? YES, patient is at high risk to self at this time. Requires involuntary admission if patient does not agree to voluntary psychiatric admission.   Medication recommendations:  Risks, benefits, side effects and alternatives to treatments reviewed:   As needed medications to manage patient's acute symptoms while in hospital care: QTc is 457 ms as of 07/2024 -Consider benztropine  1 mg PO/IM two times daily as needed for extrapyramidal side effects associated with antipsychotic treatment (muscle stiffness, parkinsonism)  -Maximize utilization of verbal de-escalation techniques, if attempts are  unsuccessful and patient poses a threat to self and others: -Consider olanzapine  (Zyprexa ) 5 mg to 10 mg PO/IM with benztropine  1 mg PO/IM every 6 hours as needed for severe agitation. Would offer patient the option of taking PO medication first, but if patient refuses then may administer IM medication as a last resort. Would not exceed 20 mg of olanzapine  within a 24-hour period. Avoid co-administering intramuscular olanzapine  with intravenous benzodiazepine, as giving both medications concurrently is associated with respiratory depression.   Non-Medication recommendations:  -Recommend placing patient on alcohol withdrawal protocol; encourage use of lorazepam  given patient's abnormal liver function tests. -Consider thiamine  and folic acid  supplementation with multivitamin given chronic alcohol use is known to deplete these -Please obtain EKG to guide psychotropic management. Note: Please stop all antipsychotic and QTc prolonging medications if patient's QTc is greater than 480 ms. Of note, to decrease the risk of prolonged QTc, please maintain potassium and magnesium  levels within normal ranges. -Agree with work up for organic causes of altered mentation and mood dysregulation, consider the following if not already performed and clinically appropriate: CT of the head, CBC and differential, basic metabolic profile, liver function tests (if abnormal consider ammonia level), urinalysis, urine toxicology screen, vitamin B12 level, vitamin D  level, TSH with reflex free T4  Observation recommendations:  per unit protocol for monitoring suicidal patient    Treatment team members, and family members if applicable, with whom risk formulation and management, and other related findings, were reviewed include the following: primary team; mother   Follow-Up Telepsychiatry C/L services: Will sign off for now. Please re-consult our service as necessary.   Total time spent in this encounter was 60 minutes with  greater than 50% of time spent in counseling and coordination of care.  Thank you for involving us  in the care of this patient. If you have any additional questions  or concerns, please call [phone] and ask for me or the provider on-call.  TELEPSYCHIATRY ATTESTATION & CONSENT  As the provider for this telehealth consult, I attest that I verified the patient's identity using two separate identifiers, introduced myself to the patient, provided my credentials, disclosed my location, and performed this encounter via a HIPAA-compliant, real-time, face-to-face, two-way, interactive audio and video platform and with the full consent and agreement of the patient (or guardian as applicable.)  Patient physical location: [facility] at [facility] . Telehealth provider physical location: home office in state of [place].  Video start time: 1950 (Central Time) Video end time: 2010 (Central Time)  IDENTIFYING DATA  Andrew Buchanan is a 53 y.o. year-old male for whom a psychiatric consultation has been ordered by the primary provider. The patient was identified using two separate identifiers.  CHIEF COMPLAINT/REASON FOR CONSULT  Behavioral health concerns   HISTORY OF PRESENT ILLNESS (HPI)  I evaluated the patient today face-to-face via secure, HIPAA-compliant telepsychiatric connection, and at the request of the primary treatment team. The reason for the telepsychiatric consultation is that the patient is a 53 year old male who presents for psychiatric evaluation on an IVC. Primary team is seeking psychotropic medication recommendations, safety evaluation to determine appropriateness for more intensive psychiatric services and diagnostic clarity as to the patient's presentation.   During one-on-one evaluation with this provider, patient was alert and oriented to self and generally to location and situation. The patient did not appear to be overtly inappropriately internally  preoccupied; patient's thought process was concrete. Patient began assessment by asserting concerns about his diabetic neuropathy and the open, oozing wound on his foot. He was concerned about walking on his foot without any more covering than the sock that he is wearing. Patient provided his recollection as to his presentation to the hospital: law enforcement came to his home and made inquiries about whether patient was suicidal and had a firearm. Patient related that he never made any suicidal remarks and does not own a firearm at this time. Patient asserted that even if law enforcement had their canines scope out his property that they would find no firearm. Patient voiced befuddlement as to why he is being held on an IVC for suicidal ideations. He feels that his family has filed the IVC so to get him out of the way and enable the rest of the family to spend his father's inheritance. Patient explained that his nephew is the sole executor of an estate worth at least 5 million dollars. Patient is furious that he is not getting his fair share of the money, explaining that his siblings have all bought themselves multiple luxury vehicles with their payouts. Patient related that he is debating whether or not to get himself a lawyer, but has not yet done so. Throughout evaluation patient insisted that it is "killing" him that he is not receiving his Valium  as prescribed (10 mg at every 6 hours). He reported that his anxiety is quite severe without his Valium  and prescribed Percocet on a round-the-clock scheduled basis. Patient denies suicidal and homicidal intent and is future oriented to return home so that he can continue to pursue legal means to obtain a fair share of his father's estate  Per collateral from Meng Winterton (mother) who was contacted at [phone] at 8:03 pm CST on 09/13/2024 for a call duration of 12 minutes and 38 seconds: This provider clearly identified self, providing name and role in the  care team. Per mother, she asserted that patient has a long history of alcoholism and abusing "pills". She reported that he is also noncompliant with seeking mental health treatment for bipolar disorder, which patient was previously diagnosed and treated with lithium. Patient has demonstrated progressively worsened mental health symptoms as a result of his continued substance abuse and lack of mental health treatment such that he is no longer able to care for self. Patient's family has been buying him groceries and anything that he might require to meet his basic needs. For example, his sister recently visited and dropped off heating units at patient's home to help keep his home warm. In fact, family has been using patient's father's large estate (worth millions) to support patient's basic needs. Of note, patient is frustrated that he is not receiving as large of a share of this estate in comparison to other family members. Mother admits that this is true given that there is concern that if patient is provided with more than to meet his basic needs, then he will likely spend the remainder on alcohol and other substances. Yesterday, patient contacted mother and clearly stated that he will obtain a firearm and end his life. Patient made this threat after he had received money from his father's estate. Patient then called the following day and informed mother that he did not carry out his threat yesterday; however, he assured mother that today he will purchase the gun and kill himself promptly. Mother explained that she was extremely concerned for patient's safety and admitted that he has made homicidal threats toward the family as well. She reported that she is extremely concerned for patient's safety and feels that patient is at very high risk to self and others at this time.   PAST PSYCHIATRIC HISTORY  Inpatient psychiatric treatment: per patient, denies  Outpatient mental health treatment: per patient, he is  established with provider that is prescribing his opioids and benzodiazepines   Current home psychotropic medications: per patient, he is taking scheduled Percocet, valium  10 mg every 8 hours, gabapentin  as well  Previous mental health diagnoses: per patient, bad anxiety Prior psychotropic medication trials: per patient, clonazepam  1 mg four times daily for anxiety,  Suicide attempts: per patient, denies  Trauma history: patient did not assert further concerns for abuse, trauma, exploitation or neglect beyond described in the HPI  Otherwise as per HPI above.  PAST MEDICAL HISTORY  Past Medical History:  Diagnosis Date   AKI (acute kidney injury) 01/08/2021   Alcohol abuse    Anxiety    Cellulitis and abscess of neck 06/10/2024   Diabetes mellitus without complication (HCC)    Diabetic foot infection (HCC) 11/20/2023   DKA (diabetic ketoacidosis) (HCC) 05/19/2020   DKA, type 2 (HCC) 06/09/2024   History of MRSA infection 12/08/2023   11/2023 Diabetic foot wound requiring surgical debridement, cultures MRSA+     Hyperosmolar hyperglycemic state (HHS) (HCC) 01/05/2021   Hypertension    Pancreatitis    Sepsis with acute renal failure without septic shock (HCC)    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   Added automatically from request for surgery [id number]       HOME MEDICATIONS  Facility Ordered Medications  Medication   gabapentin  (NEURONTIN ) capsule 600 mg   diazepam  (VALIUM ) tablet 5 mg   oxyCODONE  (Oxy IR/ROXICODONE ) immediate release tablet 5 mg   insulin  aspart protamine- aspart (NOVOLOG  MIX 70/30) injection 10 Units   insulin  aspart (novoLOG ) injection 0-9 Units   LORazepam  (ATIVAN ) tablet 1-4  mg   Or   LORazepam  (ATIVAN ) injection 1-4 mg   thiamine  (VITAMIN B1) tablet 100 mg   Or   thiamine  (VITAMIN B1) injection 100 mg   folic acid  (FOLVITE ) tablet 1 mg   multivitamin with minerals tablet 1 tablet   PTA Medications  Medication Sig   gabapentin  (NEURONTIN ) 300 MG  capsule Take 2 capsules (600 mg total) by mouth 2 (two) times daily.   blood glucose meter kit and supplies KIT Dispense based on patient and insurance preference. Use up to four times daily as directed.   Insulin  Pen Needle 31G X 5 MM MISC 1 each by Does not apply route 4 (four) times daily.   clonazePAM  (KLONOPIN ) 1 MG tablet Take 1 tablet (1 mg total) by mouth 2 (two) times daily as needed for up to 7 days.   insulin  NPH-regular Human (HUMULIN  70/30) (70-30) 100 UNIT/ML injection Inject 18 Units into the skin 2 (two) times daily with a meal.   Continuous Glucose Sensor (DEXCOM G7 SENSOR) MISC Apply to skin as directed. Change every 10 days.   insulin  lispro (HUMALOG) 100 UNIT/ML injection Inject 5-14 Units into the skin 3 (three) times daily with meals.   folic acid  (FOLVITE ) 1 MG tablet Take 1 tablet (1 mg total) by mouth daily.   hydrochlorothiazide  (HYDRODIURIL ) 12.5 MG tablet Take 1 tablet (12.5 mg total) by mouth daily.   thiamine  (VITAMIN B-1) 100 MG tablet Take 1 tablet (100 mg total) by mouth daily.   sulfamethoxazole -trimethoprim  (BACTRIM  DS) 800-160 MG tablet Take 1 tablet by mouth 2 (two) times daily.     ALLERGIES  Allergies  Allergen Reactions   Amlodipine Shortness Of Breath   Antihistamines, Chlorpheniramine-Type Anaphylaxis   Statins Other (See Comments)    Pt declines    SOCIAL & SUBSTANCE USE HISTORY  Social History   Socioeconomic History   Marital status: Single    Spouse name: Not on file   Number of children: Not on file   Years of education: Not on file   Highest education level: Not on file  Occupational History   Not on file  Tobacco Use   Smoking status: Some Days    Types: Cigars   Smokeless tobacco: Never  Substance and Sexual Activity   Alcohol use: Yes    Comment: 1 bottle (a pint) of vodka per day.   Drug use: Yes    Types: Crack cocaine, Benzodiazepines, Cocaine   Sexual activity: Not on file  Other Topics Concern   Not on file  Social  History Narrative   Not on file   Social Drivers of Health   Financial Resource Strain: Medium Risk (11/15/2022)   Received from [facility]   Overall Financial Resource Strain (CARDIA)    Difficulty of Paying Living Expenses: Somewhat hard  Food Insecurity: No Food Insecurity (06/11/2024)   Hunger Vital Sign    Worried About Running Out of Food in the Last Year: Never true    Ran Out of Food in the Last Year: Never true  Recent Concern: Food Insecurity - Food Insecurity Present (03/23/2024)   Received from [facility]   Hunger Vital Sign    Within the past 12 months, you worried that your food would run out before you got the money to buy more.: Sometimes true    Within the past 12 months, the food you bought just didn't last and you didn't have money to get more.: Sometimes true  Transportation Needs: No  Transportation Needs (06/11/2024)   PRAPARE - Administrator, Civil Service (Medical): No    Lack of Transportation (Non-Medical): No  Recent Concern: Transportation Needs - Unmet Transportation Needs (03/23/2024)   Received from [facility] - Transportation    Lack of Transportation (Medical): Yes    Lack of Transportation (Non-Medical): Yes  Physical Activity: Inactive (11/15/2022)   Received from [facility]   Exercise Vital Sign    On average, how many days per week do you engage in moderate to strenuous exercise (like a brisk walk)?: 0 days    On average, how many minutes do you engage in exercise at this level?: 0 min  Stress: Stress Concern Present (11/15/2022)   Received from [facility] - Occupational Stress Questionnaire    Feeling of Stress : Rather much  Social Connections: Socially Isolated (11/15/2022)   Received from [facility]   Social Connection and Isolation Panel    In a typical week, how many times do you talk on the phone with family, friends, or neighbors?: More than three times a  week    How often do you get together with friends or relatives?: Twice a week    How often do you attend church or religious services?: Never    Do you belong to any clubs or organizations such as church groups, unions, fraternal or athletic groups, or school groups?: No    How often do you attend meetings of the clubs or organizations you belong to?: Never    Are you married, widowed, divorced, separated, never married, or living with a partner?: Never married   Social History   Tobacco Use  Smoking Status Some Days   Types: Cigars  Smokeless Tobacco Never   Social History   Substance and Sexual Activity  Alcohol Use Yes   Comment: 1 bottle (a pint) of vodka per day.   Social History   Substance and Sexual Activity  Drug Use Yes   Types: Crack cocaine, Benzodiazepines, Cocaine    Additional pertinent information none disclosed.  FAMILY HISTORY  Family History  Problem Relation Age of Onset   Alcoholism Father     MENTAL STATUS EXAM (MSE)  Mental Status Exam: General Appearance: Fairly Groomed  Orientation:  Full (Time, Place, and Person)  Memory:  Immediate;   Fair Recent;   Fair Remote;   Fair  Concentration:  Concentration: Fair and Attention Span: Fair  Recall:  Fair  Attention  Fair  Eye Contact:  Fair  Speech:  Normal Rate  Language:  Fair  Volume:  Increased  Mood: okay now  Affect:  Full Range  Thought Process:  Goal Directed  Thought Content:  Illogical  Suicidal Thoughts:  No  Homicidal Thoughts:  No  Judgement:  Poor  Insight:  Lacking  Psychomotor Activity:  Restlessness  Akathisia:  No  Fund of Knowledge:  Poor    Assets:  Financial Resources/Insurance  Cognition:  Impaired,  Mild  ADL's:  Impaired  AIMS (if indicated):       VITALS  Blood pressure (!) 137/95, pulse 88, temperature 98.2 F (36.8 C), temperature source Oral, resp. rate 20, height 6' (1.829 m), weight 70.5 kg, SpO2 98%.  LABS  Admission on 09/13/2024  Component Date  Value Ref Range Status   Sodium 09/13/2024 142  135 - 145 mmol/L Final   Potassium 09/13/2024 4.1  3.5 - 5.1 mmol/L Final  Chloride 09/13/2024 102  98 - 111 mmol/L Final   CO2 09/13/2024 22  22 - 32 mmol/L Final   Glucose, Bld 09/13/2024 332 (H)  70 - 99 mg/dL Final   Glucose reference range applies only to samples taken after fasting for at least 8 hours.   BUN 09/13/2024 11  6 - 20 mg/dL Final   Creatinine, Ser 09/13/2024 1.15  0.61 - 1.24 mg/dL Final   Calcium  09/13/2024 8.7 (L)  8.9 - 10.3 mg/dL Final   Total Protein 87/92/7974 8.8 (H)  6.5 - 8.1 g/dL Final   Albumin 87/92/7974 4.2  3.5 - 5.0 g/dL Final   AST 87/92/7974 186 (H)  15 - 41 U/L Final   ALT 09/13/2024 60 (H)  0 - 44 U/L Final   Alkaline Phosphatase 09/13/2024 166 (H)  38 - 126 U/L Final   Total Bilirubin 09/13/2024 0.5  0.0 - 1.2 mg/dL Final   GFR, Estimated 09/13/2024 >60  >60 mL/min Final   Comment: (NOTE) Calculated using the CKD-EPI Creatinine Equation (2021)    Anion gap 09/13/2024 18 (H)  5 - 15 Final   Performed at [facility], [address] [place], [place] [address]   Alcohol, Ethyl (B) 09/13/2024 308 (HH)  <15 mg/dL Final   Comment: Critical Value, Read Back and verified with KERRY NELSON @1556  09/13/24 MJU (NOTE) For medical purposes only. Performed at [facility], [address] [place], [place] [address]    WBC 09/13/2024 8.2  4.0 - 10.5 K/uL Final   RBC 09/13/2024 3.91 (L)  4.22 - 5.81 MIL/uL Final   Hemoglobin 09/13/2024 13.8  13.0 - 17.0 g/dL Final   HCT 87/92/7974 40.3  39.0 - 52.0 % Final   MCV 09/13/2024 103.1 (H)  80.0 - 100.0 fL Final   MCH 09/13/2024 35.3 (H)  26.0 - 34.0 pg Final   MCHC 09/13/2024 34.2  30.0 - 36.0 g/dL Final   RDW 87/92/7974 18.6 (H)  11.5 - 15.5 % Final   Platelets 09/13/2024 114 (L)  150 - 400 K/uL Final   nRBC 09/13/2024 0.0  0.0 - 0.2 % Final   Performed at [facility], [address] [place], [place] [address]    Glucose-Capillary 09/13/2024 277 (H)  70 - 99 mg/dL Final   Glucose reference range applies only to samples taken after fasting for at least 8 hours.    PSYCHIATRIC REVIEW OF SYSTEMS (ROS)  ROS: Notable for the following relevant positive findings: Review of Systems  Psychiatric/Behavioral:  Positive for depression and substance abuse. Negative for hallucinations, memory loss and suicidal ideas. The patient is nervous/anxious. The patient does not have insomnia.     Additional findings:      Musculoskeletal: No abnormal movements observed      Gait & Station: Laying/Sitting      Pain Screening: Present - mild to moderate      Nutrition & Dental Concerns: none disclosed   RISK FORMULATION/ASSESSMENT  Is the patient experiencing any suicidal or homicidal ideations: Yes       Explain if yes: he assured mother that today he will purchase the gun and kill himself promptly Protective factors considered for safety management: Current care in a highly monitored health care setting  Risk factors/concerns considered for safety management:  Depression Substance abuse/dependence Physical illness/chronic pain Recent loss Access to lethal means Impulsivity Aggression Isolation Barriers to accessing treatment Unwillingness to seek help Male gender Unmarried  Is there a safety management plan with  the patient and treatment team to minimize risk factors and promote protective factors: Yes           Explain: psychiatric admission Is crisis care placement or psychiatric hospitalization recommended: Yes     Based on my current evaluation and risk assessment, patient is determined at this time to be at:  High risk  *RISK ASSESSMENT Risk assessment is a dynamic process; it is possible that this patient's condition, and risk level, may change. This should be re-evaluated and managed over time as appropriate. Please re-consult psychiatric consult services if additional assistance is needed in terms  of risk assessment and management. If your team decides to discharge this patient, please advise the patient how to best access emergency psychiatric services, or to call 911, if their condition worsens or they feel unsafe in any way.   Charlene Buba, MD Telepsychiatry Consult Services

## 2024-09-13 NOTE — ED Notes (Signed)
Patient resting quietly with eyes closed. No acute distress noted.

## 2024-09-13 NOTE — ED Provider Notes (Signed)
 MD Lorriane advises Patient DOES meet criteria for inpatient psychiatric hospitalization.  Patient will remain under involuntary commitment.  Current plan is for psychiatric inpatient admission.  Continue on withdrawal monitoring/CIWA   Dicky Anes, MD 09/13/24 2221

## 2024-09-13 NOTE — ED Provider Notes (Signed)
 [facility] Provider Note    Event Date/Time   First MD Initiated Contact with Patient 09/13/24 1536     (approximate)   History   Involuntary commitment  HPI  Andrew Buchanan is a 53 y.o. male  hypertension, diabetes, HHS, foot ulcers, substance use, alcohol use, medication nonadherence, anxiety, bipolar disorder. Recent admisson.      Patient reports no concerns.  His prior foot inspections doing well.  He is seeing a doctor in [place] [facility].  He is currently on Percocet and they have been tapering down his Valium .  He reports he was drinking earlier today, but adamantly denies that he is made any sort of statement to harm himself or anyone else.  He is under IVC though with notice of concern for bipolar noncompliant with medications and suicidal ideation  Currently only uses insulin  twice a day [reviewed notes by PCP, 10 units 7030]  Physical Exam   Triage Vital Signs: ED Triage Vitals [09/13/24 1444]  Encounter Vitals Group     BP      Girls Systolic BP Percentile      Girls Diastolic BP Percentile      Boys Systolic BP Percentile      Boys Diastolic BP Percentile      Pulse      Resp      Temp      Temp src      SpO2      Weight 155 lb 6.8 oz (70.5 kg)     Height 6' (1.829 m)     Head Circumference      Peak Flow      Pain Score      Pain Loc      Pain Education      Exclude from Growth Chart     Most recent vital signs: Vitals:   09/13/24 1602 09/13/24 2131  BP: (!) 137/95 (!) 123/99  Pulse: 88 (!) 128  Resp: 20 20  Temp: 98.2 F (36.8 C) 97.8 F (36.6 C)  SpO2: 98% 100%     General: Awake, no distress.  CV:  Good peripheral perfusion.  Normal tone Resp:  Normal effort.  Clear bilateral Abd:  No distention.  Soft Other:  Is a small superficial diabetic foot ulcer that does not have any surrounding purulence or drainage or surrounding erythema.  It appears quite clean on the bottom of his right  foot   Patient reports no abdominal pain no symptoms has been doing well reports his medical care is going well no abdominal pain no other symptoms   ED Results / Procedures / Treatments   Labs (all labs ordered are listed, but only abnormal results are displayed) Labs Reviewed  COMPREHENSIVE METABOLIC PANEL WITH GFR - Abnormal; Notable for the following components:      Result Value   Glucose, Bld 332 (*)    Calcium  8.7 (*)    Total Protein 8.8 (*)    AST 186 (*)    ALT 60 (*)    Alkaline Phosphatase 166 (*)    Anion gap 18 (*)    All other components within normal limits  ETHANOL - Abnormal; Notable for the following components:   Alcohol, Ethyl (B) 308 (*)    All other components within normal limits  CBC - Abnormal; Notable for the following components:   RBC 3.91 (*)    MCV 103.1 (*)    MCH 35.3 (*)    RDW  18.6 (*)    Platelets 114 (*)    All other components within normal limits  ACETAMINOPHEN  LEVEL - Abnormal; Notable for the following components:   Acetaminophen  (Tylenol ), Serum <10 (*)    All other components within normal limits  CBG MONITORING, ED - Abnormal; Notable for the following components:   Glucose-Capillary 277 (*)    All other components within normal limits  URINE DRUG SCREEN   Transaminitis is noted, it seems to be unlikely in alcohol like injury pattern.  He has no associated abdominal pain.  He denies any overdose or ingestion does take oxycodone  with acetaminophen  in it but reports that this is prescribed and he follows it as prescribed.  I did check an acetaminophen  level and it is    Based on his description and description of IVC I think there is a low risk that he had any sort of an acute Tylenol  intoxication and this pattern seems to most fit that of chronic alcoholic injury  Negative acetaminophen  level.  Denies any overdose or ingestion  PROCEDURES:  Critical Care performed: No  Procedures   MEDICATIONS ORDERED IN ED: Medications   gabapentin  (NEURONTIN ) capsule 600 mg (600 mg Oral Given 09/13/24 2109)  diazepam  (VALIUM ) tablet 5 mg (5 mg Oral Given 09/13/24 1630)  oxyCODONE  (Oxy IR/ROXICODONE ) immediate release tablet 5 mg (5 mg Oral Given 09/13/24 1608)  insulin  aspart protamine- aspart (NOVOLOG  MIX 70/30) injection 10 Units (has no administration in time range)  insulin  aspart (novoLOG ) injection 0-9 Units (5 Units Subcutaneous Given 09/13/24 1720)  LORazepam  (ATIVAN ) tablet 1-4 mg (2 mg Oral Given 09/13/24 2000)    Or  LORazepam  (ATIVAN ) injection 1-4 mg ( Intravenous See Alternative 09/13/24 2000)  thiamine  (VITAMIN B1) tablet 100 mg (100 mg Oral Given 09/13/24 1604)    Or  thiamine  (VITAMIN B1) injection 100 mg ( Intravenous See Alternative 09/13/24 1604)  folic acid  (FOLVITE ) tablet 1 mg (1 mg Oral Given 09/13/24 1604)  multivitamin with minerals tablet 1 tablet (1 tablet Oral Given 09/13/24 1603)  OLANZapine  (ZYPREXA ) tablet 5 mg (has no administration in time range)    Or  OLANZapine  (ZYPREXA ) injection 5 mg (has no administration in time range)  benztropine  (COGENTIN ) tablet 1 mg (has no administration in time range)    Or  benztropine  mesylate (COGENTIN ) injection 1 mg (has no administration in time range)     IMPRESSION / MDM / ASSESSMENT AND PLAN / ED COURSE  I reviewed the triage vital signs and the nursing notes.                              Differential diagnosis includes, but is not limited to, here for dental health evaluation possible overuse or abuse of some of his medications, alcohol which he freely admits to using alcohol daily etc.  Will place him on Valium  which appears his doctors been slowly tapering him off, his insulin , and order diabetes consult.  He is under IVC pending psychiatry consult.  He does not have any symptoms of an acute medical illness.  LFTs are abnormal but in a pattern consistent with alcohol abuse.  Glucose 330 no reduction in CO2 minimally elevated anion gap.  Will continue  patient on 7030 consult diabetes coronary continue to follow his glucose.  CBC with elevated MCV and decreased platelets again felt likely in the setting of alcohol use.  Will place the patient on alcohol withdrawal score.  Patient's presentation is most consistent with acute complicated illness / injury requiring diagnostic workup.   His physical exam is overall reassuring without acute toxic pathology noted and he has no acute medical complaints at this time  Ongoing care signed to partner Dr. Neomi.       FINAL CLINICAL IMPRESSION(S) / ED DIAGNOSES   Final diagnoses:  Involuntary commitment  Alcohol abuse  At risk for alcohol withdrawal     Rx / DC Orders   ED Discharge Orders     None        Note:  This document was prepared using Dragon voice recognition software and may include unintentional dictation errors.   Dicky Anes, MD 09/13/24 [phone]

## 2024-09-13 NOTE — ED Notes (Signed)
 Pt dressed out by this RN, and EDT Darryl. Pt belongings: Pair of shoes Pair of socks Pair of pants 1 sweat shirt 1 watch 1 cell phone

## 2024-09-13 NOTE — BH Assessment (Signed)
 Writer placed psych consult with [facility] providers for patient to be seen.

## 2024-09-13 NOTE — ED Notes (Signed)
 Pt dressed out in triage had  ($33) 1 - $20  1 - $5 bill 8 - $1 bills

## 2024-09-13 NOTE — ED Notes (Signed)
 Consult with telepsych started.

## 2024-09-13 NOTE — TOC Initial Note (Signed)
 Transition of Care [facility] - Initial/Assessment Note    Patient Details  Name: Andrew Buchanan MRN: [id number] Date of Birth: 1971-06-07  [facility] of [facility]) CM/SW Contact:    Amandamarie Feggins L Micaila Ziemba, LCSW Phone Number: 09/13/2024, 5:13 PM  Clinical Narrative:                  [facility] consult received for substance abuse education/counseling. TOC does not provided education/counseling. Substance Abuse resources added to the AVS.        Patient Goals and CMS Choice            Expected Discharge Plan and Services                                              Prior Living Arrangements/Services                       Activities of Daily Living      Permission Sought/Granted                  Emotional Assessment              Admission diagnosis:  IVC Patient Active Problem List   Diagnosis Date Noted   Hyperkalemia 07/19/2024   Hyponatremia 07/19/2024   Right knee pain 07/18/2024   Abrasion, nose w/o infection 07/17/2024   Diabetic ulcer of right midfoot associated with type 2 diabetes mellitus, with fat layer exposed (HCC) 07/17/2024   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   MRSA bacteremia 07/16/2024   Lactic acidosis 07/16/2024   Chronic pain syndrome 07/16/2024   Hypokalemia 06/10/2024   Hypomagnesemia 06/10/2024   Neck abscess 06/10/2024   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) 11/20/2023   Rhabdomyolysis 08/25/2021   Polysubstance abuse (HCC) 08/25/2021   Anxiety 08/25/2021   Elevated troponin 08/25/2021   Primary hypertension 08/25/2021   History of bacteremia 08/25/2021   AKI (acute kidney injury) 01/08/2021   Malnutrition of moderate degree 01/07/2021   Noncompliance with medications 05/19/2020   Alcohol intoxication delirium 11/26/2018   Alcohol abuse 11/26/2018   Bipolar disorder, unspecified (HCC) 11/26/2018   PCP:  Missy Mallie MATSU, MD Pharmacy:   [id number] [place], [place] - [address] AT [facility] OF [address] [place] [address] Phone: [phone] Fax: [phone]  [facility] [address] [place] [address] Phone: [phone] Fax: [phone]  [id number] - [place], [place] - [address] AT [facility] OF [address] [place] [address] Phone: [phone] Fax: [phone]     Social Drivers of Health (SDOH) Social History: SDOH Screenings   Food Insecurity: No Food Insecurity (06/11/2024)  Recent Concern: Food Insecurity - Food Insecurity Present (03/23/2024)   Received from [facility]  Housing: Low Risk  (06/11/2024)  Transportation Needs: No Transportation Needs (06/11/2024)  Recent Concern: Transportation Needs - Unmet Transportation Needs (03/23/2024)   Received from [facility]  Utilities: Not At Risk (06/11/2024)  Financial Resource Strain: Medium Risk (11/15/2022)   Received from [facility]  Physical Activity: Inactive (11/15/2022)   Received from [facility]  Social Connections: Socially Isolated (11/15/2022)   Received from [facility]  Stress: Stress Concern Present (11/15/2022)   Received from [facility]  Tobacco Use: High Risk (09/13/2024)  Health Literacy: Low Risk (11/15/2022)   Received from [facility]   SDOH Interventions:     Readmission Risk Interventions     No data to display

## 2024-09-14 ENCOUNTER — Encounter: Payer: Self-pay | Admitting: Psychiatry

## 2024-09-14 ENCOUNTER — Other Ambulatory Visit: Payer: Self-pay

## 2024-09-14 ENCOUNTER — Inpatient Hospital Stay

## 2024-09-14 ENCOUNTER — Inpatient Hospital Stay
Admission: EM | Admit: 2024-09-14 | Discharge: 2024-09-16 | DRG: 897 | Disposition: A | Payer: Self-pay | Source: Intra-hospital | Attending: Psychiatry | Admitting: Psychiatry

## 2024-09-14 DIAGNOSIS — I10 Essential (primary) hypertension: Secondary | ICD-10-CM | POA: Diagnosis present

## 2024-09-14 DIAGNOSIS — F32A Depression, unspecified: Principal | ICD-10-CM | POA: Diagnosis present

## 2024-09-14 DIAGNOSIS — L089 Local infection of the skin and subcutaneous tissue, unspecified: Secondary | ICD-10-CM | POA: Diagnosis present

## 2024-09-14 DIAGNOSIS — F1994 Other psychoactive substance use, unspecified with psychoactive substance-induced mood disorder: Secondary | ICD-10-CM | POA: Diagnosis not present

## 2024-09-14 DIAGNOSIS — R6889 Other general symptoms and signs: Secondary | ICD-10-CM

## 2024-09-14 DIAGNOSIS — F10929 Alcohol use, unspecified with intoxication, unspecified: Secondary | ICD-10-CM | POA: Diagnosis not present

## 2024-09-14 LAB — GLUCOSE, CAPILLARY
Glucose-Capillary: 365 mg/dL — ABNORMAL HIGH (ref 70–99)
Glucose-Capillary: 291 mg/dL — ABNORMAL HIGH (ref 70–99)
Glucose-Capillary: 131 mg/dL — ABNORMAL HIGH (ref 70–99)
Glucose-Capillary: 167 mg/dL — ABNORMAL HIGH (ref 70–99)
Glucose-Capillary: 150 mg/dL — ABNORMAL HIGH (ref 70–99)

## 2024-09-14 LAB — URINE DRUG SCREEN
Amphetamines: NEGATIVE
Barbiturates: NEGATIVE
Benzodiazepines: POSITIVE — AB
Cocaine: NEGATIVE
Fentanyl: NEGATIVE
Methadone Scn, Ur: NEGATIVE
Opiates: NEGATIVE
Tetrahydrocannabinol: NEGATIVE

## 2024-09-14 LAB — CBG MONITORING, ED: Glucose-Capillary: 321 mg/dL — ABNORMAL HIGH (ref 70–99)

## 2024-09-14 MED ORDER — HALOPERIDOL 5 MG PO TABS: 5.0000 mg | ORAL_TABLET | Freq: Three times a day (TID) | ORAL | Status: DC | PRN

## 2024-09-14 MED ORDER — ACETAMINOPHEN 500 MG PO TABS
1000.0000 mg | ORAL_TABLET | Freq: Four times a day (QID) | ORAL | Status: DC | PRN
  Filled 2024-09-14: qty 2

## 2024-09-14 MED ORDER — COLLAGENASE 250 UNIT/GM EX OINT
TOPICAL_OINTMENT | Freq: Every day | CUTANEOUS | Status: DC
  Filled 2024-09-14: qty 30

## 2024-09-14 MED ORDER — ACETAMINOPHEN 325 MG PO TABS
650.0000 mg | ORAL_TABLET | Freq: Four times a day (QID) | ORAL | Status: DC | PRN
  Administered 2024-09-14 – 2024-09-16 (×7): 650 mg via ORAL
  Filled 2024-09-14 (×7): qty 2

## 2024-09-14 MED ORDER — ONDANSETRON 4 MG PO TBDP
4.0000 mg | ORAL_TABLET | Freq: Four times a day (QID) | ORAL | Status: DC | PRN
  Administered 2024-09-14 – 2024-09-15 (×2): 4 mg via ORAL
  Filled 2024-09-14 (×2): qty 1

## 2024-09-14 MED ORDER — INSULIN ASPART 100 UNIT/ML IJ SOLN
4.0000 [IU] | Freq: Three times a day (TID) | INTRAMUSCULAR | Status: DC
  Administered 2024-09-14 – 2024-09-15 (×2): 4 [IU] via SUBCUTANEOUS
  Filled 2024-09-14 (×2): qty 4

## 2024-09-14 MED ORDER — HALOPERIDOL LACTATE 5 MG/ML IJ SOLN: 10.0000 mg | Freq: Three times a day (TID) | INTRAMUSCULAR | Status: DC | PRN

## 2024-09-14 MED ORDER — INSULIN ASPART 100 UNIT/ML IJ SOLN: 0.0000 [IU] | Freq: Three times a day (TID) | INTRAMUSCULAR | Status: DC

## 2024-09-14 MED ORDER — INFLUENZA VIRUS VACC SPLIT PF (FLUZONE) 0.5 ML IM SUSY
0.5000 mL | PREFILLED_SYRINGE | INTRAMUSCULAR | Status: DC
  Filled 2024-09-14: qty 0.5

## 2024-09-14 MED ORDER — LOSARTAN POTASSIUM 50 MG PO TABS
100.0000 mg | ORAL_TABLET | Freq: Every day | ORAL | Status: DC
  Administered 2024-09-14 – 2024-09-16 (×3): 100 mg via ORAL
  Filled 2024-09-14 (×4): qty 2

## 2024-09-14 MED ORDER — HYDROXYZINE HCL 25 MG PO TABS: 25.0000 mg | ORAL_TABLET | Freq: Four times a day (QID) | ORAL | Status: DC | PRN

## 2024-09-14 MED ORDER — LORAZEPAM 1 MG PO TABS: 1.0000 mg | ORAL_TABLET | Freq: Every day | ORAL | Status: DC

## 2024-09-14 MED ORDER — DIPHENHYDRAMINE HCL 25 MG PO CAPS: 50.0000 mg | ORAL_CAPSULE | Freq: Three times a day (TID) | ORAL | Status: DC | PRN

## 2024-09-14 MED ORDER — LORAZEPAM 2 MG/ML IJ SOLN: 2.0000 mg | Freq: Three times a day (TID) | INTRAMUSCULAR | Status: DC | PRN

## 2024-09-14 MED ORDER — TRAZODONE HCL 50 MG PO TABS
50.0000 mg | ORAL_TABLET | Freq: Every evening | ORAL | Status: DC | PRN
  Administered 2024-09-14 – 2024-09-15 (×2): 50 mg via ORAL
  Filled 2024-09-14 (×2): qty 1

## 2024-09-14 MED ORDER — DIPHENHYDRAMINE HCL 50 MG/ML IJ SOLN: 50.0000 mg | Freq: Three times a day (TID) | INTRAMUSCULAR | Status: DC | PRN

## 2024-09-14 MED ORDER — LORAZEPAM 1 MG PO TABS: 1.0000 mg | ORAL_TABLET | Freq: Four times a day (QID) | ORAL | Status: DC | PRN

## 2024-09-14 MED ORDER — ALUM & MAG HYDROXIDE-SIMETH 200-200-20 MG/5ML PO SUSP
30.0000 mL | ORAL | Status: DC | PRN
  Administered 2024-09-14: 30 mL via ORAL
  Filled 2024-09-14: qty 30

## 2024-09-14 MED ORDER — THIAMINE HCL 100 MG/ML IJ SOLN: 100.0000 mg | Freq: Once | INTRAMUSCULAR | Status: DC

## 2024-09-14 MED ORDER — MAGNESIUM HYDROXIDE 400 MG/5ML PO SUSP: 30.0000 mL | Freq: Every day | ORAL | Status: DC | PRN

## 2024-09-14 MED ORDER — OXYCODONE HCL 5 MG PO TABS
5.0000 mg | ORAL_TABLET | Freq: Three times a day (TID) | ORAL | Status: DC | PRN
  Administered 2024-09-14 – 2024-09-16 (×6): 5 mg via ORAL
  Filled 2024-09-14 (×6): qty 1

## 2024-09-14 MED ORDER — ADULT MULTIVITAMIN W/MINERALS CH
1.0000 | ORAL_TABLET | Freq: Every day | ORAL | Status: DC
  Administered 2024-09-14 – 2024-09-16 (×3): 1 via ORAL
  Filled 2024-09-14 (×3): qty 1

## 2024-09-14 MED ORDER — OXYCODONE HCL 5 MG PO TABS
10.0000 mg | ORAL_TABLET | Freq: Four times a day (QID) | ORAL | Status: DC | PRN
  Filled 2024-09-14: qty 2

## 2024-09-14 MED ORDER — GABAPENTIN 300 MG PO CAPS
600.0000 mg | ORAL_CAPSULE | Freq: Two times a day (BID) | ORAL | Status: DC
  Administered 2024-09-14 – 2024-09-16 (×5): 600 mg via ORAL
  Filled 2024-09-14 (×5): qty 2

## 2024-09-14 MED ORDER — INSULIN ASPART PROT & ASPART (70-30 MIX) 100 UNIT/ML ~~LOC~~ SUSP
10.0000 [IU] | Freq: Two times a day (BID) | SUBCUTANEOUS | Status: DC
  Filled 2024-09-14: qty 10

## 2024-09-14 MED ORDER — THIAMINE MONONITRATE 100 MG PO TABS
100.0000 mg | ORAL_TABLET | Freq: Every day | ORAL | Status: DC
  Administered 2024-09-15 – 2024-09-16 (×2): 100 mg via ORAL
  Filled 2024-09-14 (×2): qty 1

## 2024-09-14 MED ORDER — DIAZEPAM 2 MG PO TABS
2.0000 mg | ORAL_TABLET | Freq: Two times a day (BID) | ORAL | Status: DC | PRN
  Administered 2024-09-14 – 2024-09-16 (×4): 2 mg via ORAL
  Filled 2024-09-14 (×4): qty 1

## 2024-09-14 MED ORDER — DIAZEPAM 5 MG PO TABS
5.0000 mg | ORAL_TABLET | Freq: Two times a day (BID) | ORAL | Status: DC | PRN
  Administered 2024-09-14: 5 mg via ORAL
  Filled 2024-09-14: qty 1

## 2024-09-14 MED ORDER — LORAZEPAM 1 MG PO TABS
1.0000 mg | ORAL_TABLET | Freq: Four times a day (QID) | ORAL | Status: AC
  Administered 2024-09-14 – 2024-09-15 (×6): 1 mg via ORAL
  Filled 2024-09-14 (×6): qty 1

## 2024-09-14 MED ORDER — LORAZEPAM 1 MG PO TABS
1.0000 mg | ORAL_TABLET | Freq: Three times a day (TID) | ORAL | Status: AC
  Administered 2024-09-15 – 2024-09-16 (×3): 1 mg via ORAL
  Filled 2024-09-14 (×3): qty 1

## 2024-09-14 MED ORDER — LOPERAMIDE HCL 2 MG PO CAPS
2.0000 mg | ORAL_CAPSULE | ORAL | Status: DC | PRN
  Administered 2024-09-16: 4 mg via ORAL
  Filled 2024-09-14: qty 1

## 2024-09-14 MED ORDER — INSULIN GLARGINE 100 UNIT/ML ~~LOC~~ SOLN
15.0000 [IU] | Freq: Every day | SUBCUTANEOUS | Status: DC
  Administered 2024-09-14 – 2024-09-15 (×2): 15 [IU] via SUBCUTANEOUS
  Filled 2024-09-14 (×2): qty 0.15

## 2024-09-14 MED ORDER — LORAZEPAM 1 MG PO TABS: 1.0000 mg | ORAL_TABLET | Freq: Two times a day (BID) | ORAL | Status: DC

## 2024-09-14 MED ORDER — HALOPERIDOL LACTATE 5 MG/ML IJ SOLN: 5.0000 mg | Freq: Three times a day (TID) | INTRAMUSCULAR | Status: DC | PRN

## 2024-09-14 MED ORDER — INSULIN ASPART 100 UNIT/ML IJ SOLN
0.0000 [IU] | Freq: Three times a day (TID) | INTRAMUSCULAR | Status: DC
  Administered 2024-09-14: 8 [IU] via SUBCUTANEOUS
  Administered 2024-09-14: 3 [IU] via SUBCUTANEOUS
  Administered 2024-09-14: 2 [IU] via SUBCUTANEOUS
  Administered 2024-09-15: 11 [IU] via SUBCUTANEOUS
  Administered 2024-09-15: 3 [IU] via SUBCUTANEOUS
  Administered 2024-09-16: 15 [IU] via SUBCUTANEOUS
  Administered 2024-09-16: 5 [IU] via SUBCUTANEOUS
  Filled 2024-09-14: qty 3
  Filled 2024-09-14: qty 15
  Filled 2024-09-14: qty 2
  Filled 2024-09-14: qty 5
  Filled 2024-09-14: qty 8
  Filled 2024-09-14: qty 11
  Filled 2024-09-14: qty 3
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
## 2024-09-14 NOTE — Progress Notes (Signed)
   09/14/24 1200  CIWA-Ar  BP 125/89  Pulse Rate 98  Nausea and Vomiting 1  Tactile Disturbances 0  Tremor 0  Auditory Disturbances 0  Paroxysmal Sweats 0  Visual Disturbances 0  Anxiety 1  Headache, Fullness in Head 0  Agitation 0  Orientation and Clouding of Sensorium 0  CIWA-Ar Total 2

## 2024-09-14 NOTE — Group Note (Signed)
 [facility] LCSW Group Therapy Note   Group Date: 09/14/2024 Start Time: 1300 End Time: 1400   Type of Therapy/Topic:  Group Therapy:  Balance in Life  Participation Level:  Did Not Attend   Description of Group:    This group will address the concept of balance and how it feels and looks when one is unbalanced. Patients will be encouraged to process areas in their lives that are out of balance, and identify reasons for remaining unbalanced. Facilitators will guide patients utilizing problem- solving interventions to address and correct the stressor making their life unbalanced. Understanding and applying boundaries will be explored and addressed for obtaining  and maintaining a balanced life. Patients will be encouraged to explore ways to assertively make their unbalanced needs known to significant others in their lives, using other group members and facilitator for support and feedback.  Therapeutic Goals: Patient will identify two or more emotions or situations they have that consume much of in their lives. Patient will identify signs/triggers that life has become out of balance:  Patient will identify two ways to set boundaries in order to achieve balance in their lives:  Patient will demonstrate ability to communicate their needs through discussion and/or role plays  Summary of Patient Progress:    Patient did not attend.     Therapeutic Modalities:   Cognitive Behavioral Therapy Solution-Focused Therapy Assertiveness Training   Alveta CHRISTELLA Kerns, LCSW

## 2024-09-14 NOTE — Group Note (Signed)
 Recreation Therapy Group Note   Group Topic:Coping Skills  Group Date: 09/14/2024 Start Time: 1040 End Time: 1120 Facilitators: Celestia Jeoffrey BRAVO, LRT, CTRS Location: Craft Room  Group Description: Mind Map.  Patient was provided a blank template of a diagram with 32 blank boxes in a tiered system, branching from the center (similar to a bubble chart). LRT directed patients to label the middle of the diagram Coping Skills. LRT and patients then came up with 8 different coping skills as examples. Pt were directed to record their coping skills in the 2nd tier boxes closest to the center.  Patients would then share their coping skills with the group as LRT wrote them out. LRT gave a handout of 99 different coping skills at the end of group.   Goal Area(s) Addressed: Patients will be able to define "coping skills". Patient will identify new coping skills.  Patient will increase communication.   Affect/Mood: N/A   Participation Level: Did not attend    Clinical Observations/Individualized Feedback: Patient did not attend group.   Plan: Continue to engage patient in RT group sessions 2-3x/week.   Jeoffrey BRAVO Celestia, LRT, CTRS 09/14/2024 11:38 AM

## 2024-09-14 NOTE — Plan of Care (Signed)
 New admission today, has not had time to progress.  Problem: Education: Goal: Knowledge of [place] [facility] Education information/materials will improve Outcome: Not Progressing Goal: Emotional status will improve Outcome: Not Progressing Goal: Mental status will improve Outcome: Not Progressing Goal: Verbalization of understanding the information provided will improve Outcome: Not Progressing   Problem: Activity: Goal: Interest or engagement in activities will improve Outcome: Not Progressing Goal: Sleeping patterns will improve Outcome: Not Progressing   Problem: Coping: Goal: Ability to verbalize frustrations and anger appropriately will improve Outcome: Not Progressing Goal: Ability to demonstrate self-control will improve Outcome: Not Progressing   Problem: Health Behavior/Discharge Planning: Goal: Identification of resources available to assist in meeting health care needs will improve Outcome: Not Progressing Goal: Compliance with treatment plan for underlying cause of condition will improve Outcome: Not Progressing   Problem: Physical Regulation: Goal: Ability to maintain clinical measurements within normal limits will improve Outcome: Not Progressing   Problem: Safety: Goal: Periods of time without injury will increase Outcome: Not Progressing   Problem: Education: Goal: Ability to describe self-care measures that may prevent or decrease complications (Diabetes Survival Skills Education) will improve Outcome: Not Progressing Goal: Individualized Educational Video(s) Outcome: Not Progressing   Problem: Coping: Goal: Ability to adjust to condition or change in health will improve Outcome: Not Progressing   Problem: Fluid Volume: Goal: Ability to maintain a balanced intake and output will improve Outcome: Not Progressing   Problem: Health Behavior/Discharge Planning: Goal: Ability to identify and utilize available resources and services will  improve Outcome: Not Progressing Goal: Ability to manage health-related needs will improve Outcome: Not Progressing   Problem: Metabolic: Goal: Ability to maintain appropriate glucose levels will improve Outcome: Not Progressing   Problem: Nutritional: Goal: Maintenance of adequate nutrition will improve Outcome: Not Progressing Goal: Progress toward achieving an optimal weight will improve Outcome: Not Progressing   Problem: Skin Integrity: Goal: Risk for impaired skin integrity will decrease Outcome: Not Progressing   Problem: Tissue Perfusion: Goal: Adequacy of tissue perfusion will improve Outcome: Not Progressing

## 2024-09-14 NOTE — ED Notes (Signed)
 Patient CBG 321.

## 2024-09-14 NOTE — Group Note (Signed)
 Date:  09/14/2024 Time:  8:51 PM  Group Topic/Focus:  Orientation:   The focus of this group is to educate the patient on the purpose and policies of crisis stabilization and provide a format to answer questions about their admission.  The group details unit policies and expectations of patients while admitted.    Participation Level:  Did Not Attend  Participation Quality:  none  Affect:  none  Cognitive:  none  Insight: None  Engagement in Group:  none  Modes of Intervention:  none  Additional Comments:  none   Andrew Buchanan 09/14/2024, 8:51 PM

## 2024-09-14 NOTE — BHH Suicide Risk Assessment (Signed)
 [facility] Admission Suicide Risk Assessment   Nursing information obtained from:  Patient Demographic factors:  Male Current Mental Status:  NA Loss Factors:  Decline in physical health Historical Factors:  Impulsivity Risk Reduction Factors:  Living with another person, especially a relative  Total Time spent with patient: 30 minutes Principal Problem: Depression, unspecified Diagnosis:  Principal Problem:   Depression, unspecified  Subjective Data: This is a 53 year old male who presented under IVC due to reportedly making suicidal statements with plan to purchase a gun to kill himself.  Psychiatric history is significant for anxiety and alcohol use.  Patient reports currently taking Percocet, Valium , gabapentin .  Patient denies mental health family history.  Patient is admitted to the adult inpatient unit with every 15-minute safety monitoring.  Multidisciplinary team approach is offered.  Medication management, group/milieu therapy is offered.  Continued Clinical Symptoms:  Alcohol Use Disorder Identification Test Final Score (AUDIT): 21 The Alcohol Use Disorders Identification Test, Guidelines for Use in Primary Care, Second Edition.  World Science Writer [facility]). Score between 0-7:  no or low risk or alcohol related problems. Score between 8-15:  moderate risk of alcohol related problems. Score between 16-19:  high risk of alcohol related problems. Score 20 or above:  warrants further diagnostic evaluation for alcohol dependence and treatment.   CLINICAL FACTORS:   Alcohol/Substance Abuse/Dependencies   Musculoskeletal: Strength & Muscle Tone: within normal limits Gait & Station: unsteady, using wheelchair on unit Patient leans: N/A  Psychiatric Specialty Exam:  Presentation  General Appearance:  Appropriate for Environment  Eye Contact: Good  Speech: Clear and Coherent  Speech Volume: Normal  Handedness:No data recorded  Mood and Affect   Mood: Irritable  Affect: Congruent   Thought Process  Thought Processes: Coherent  Descriptions of Associations:Intact  Orientation:Full (Time, Place and Person)  Thought Content:WDL  History of Schizophrenia/Schizoaffective disorder:No data recorded Duration of Psychotic Symptoms:No data recorded Hallucinations:Hallucinations: None  Ideas of Reference:None  Suicidal Thoughts:Suicidal Thoughts: No  Homicidal Thoughts:Homicidal Thoughts: No   Sensorium  Memory: Immediate Fair; Recent Fair  Judgment: Poor  Insight: Lacking   Executive Functions  Concentration: Fair  Attention Span: Fair  Recall: [employer] of Knowledge: Fair  Language: Fair   Psychomotor Activity  Psychomotor Activity: Psychomotor Activity: Normal   Assets  Assets: Communication Skills; Financial Resources/Insurance; Housing; Social Support   Sleep  Sleep: Sleep: Good    Physical Exam: Physical Exam ROS Blood pressure 125/89, pulse 98, temperature 99.1 F (37.3 C), temperature source Oral, resp. rate 18, SpO2 100%. There is no height or weight on file to calculate BMI.   COGNITIVE FEATURES THAT CONTRIBUTE TO RISK:  None    SUICIDE RISK:   Mild:  Suicidal ideation of limited frequency, intensity, duration, and specificity.  There are no identifiable plans, no associated intent, mild dysphoria and related symptoms, good self-control (both objective and subjective assessment), few other risk factors, and identifiable protective factors, including available and accessible social support.  PLAN OF CARE: Patient is admitted to the adult inpatient unit with every 15-minute safety monitoring.  Multidisciplinary team approach is offered.  Medication management, group/milieu therapy is offered.  I certify that inpatient services furnished can reasonably be expected to improve the patient's condition.   Camelia LITTIE Lukes, PA-C 09/14/2024, 3:44 PM

## 2024-09-14 NOTE — Progress Notes (Signed)
   09/14/24 1753  CIWA-Ar  Nausea and Vomiting 0  Tactile Disturbances 0  Tremor 0  Auditory Disturbances 0  Paroxysmal Sweats 0  Visual Disturbances 0  Anxiety 2  Headache, Fullness in Head 0  Agitation 1 (due to pain)  Orientation and Clouding of Sensorium 0  CIWA-Ar Total 3

## 2024-09-14 NOTE — Group Note (Signed)
 Recreation Therapy Group Note   Group Topic:Other  Group Date: 09/14/2024 Start Time: 1515 End Time: 1605 Facilitators: Celestia Jeoffrey FORBES ARTICE, CTRS Location: Craft Room  Activity Description/Intervention: Therapeutic Drumming. Patients with peers and staff were given the opportunity to engage in a leader facilitated [facility] Group Empowerment Drumming Circle with staff from the [employer], in partnership with [employer]. Teaching laboratory technician and trained walt [place]ney, Norleen Mon leading with LRT observing and documenting intervention and pt response. This evidenced-based practice targets 7 areas of health and wellbeing in the human experience including: stress-reduction, exercise, self-expression, camaraderie/support, nurturing, spirituality, and music-making (leisure).    Goal Area(s) Addresses:  Patient will engage in pro-social way in music group.  Patient will follow directions of drum leader on the first prompt. Patient will demonstrate no behavioral issues during group.  Patient will identify if a reduction in stress level occurs as a result of participation in therapeutic drum circle.     Affect/Mood: N/A   Participation Level: Did not attend    Clinical Observations/Individualized Feedback: Patient did not attend group.   Plan: Continue to engage patient in RT group sessions 2-3x/week.   [address] LRT, CTRS 09/14/2024 5:20 PM
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
## 2024-09-14 NOTE — ED Provider Notes (Signed)
 12:24 AM  Patient accepted to BMU 313 PENDING EKG and UDS and arrive at 0100. Attending: Jadapalle Dx: Unspecified Depressive Disorder.    EKG Interpretation Date/Time:  Monday September 14 2024 00:14:40 EST Ventricular Rate:  89 PR Interval:  132 QRS Duration:  84 QT Interval:  384 QTC Calculation: 467 R Axis:   -29  Text Interpretation: Normal sinus rhythm Normal ECG When compared with ECG of 15-Jul-2024 09:15, PREVIOUS ECG IS PRESENT Confirmed by Neomi Neptune [phone]) on 09/14/2024 12:24:41 AM        Patient medically cleared at this time.   Patrich Heinze, Neptune SAILOR, DO 09/14/24 [phone]

## 2024-09-14 NOTE — ED Notes (Signed)
 Patient has been accepted to [facility] BMU.  Patient assigned to room 313 Accepting physician is Dr. Jadapalle.  Call report to [phone].  Representative was Kenisha.   ER Staff is aware of it:  [facility] ER Secretary  Dr. Neomi, ER MD  [facility] Patient's Nurse

## 2024-09-14 NOTE — Inpatient Diabetes Management (Signed)
 Inpatient Diabetes Program Recommendations  AACE/ADA: New Consensus Statement on Inpatient Glycemic Control  Target Ranges:  Prepandial:   less than 140 mg/dL      Peak postprandial:   less than 180 mg/dL (1-2 hours)      Critically ill patients:  140 - 180 mg/dL    Latest Reference Range & Units 09/14/24 02:02 09/14/24 07:44  Glucose-Capillary 70 - 99 mg/dL 634 (H) 708 (H)   Review of Glycemic Control  Diabetes history: DM2 Outpatient Diabetes medications: Humulin  70/30 18 units BID, Actos  15 mg daily, Dexcom G7 Current orders for Inpatient glycemic control: 70/30 10 units BID, Novolog  0-15 units TID with meals   Inpatient Diabetes Program Recommendations:     Insulin : Per MAR, patient refused 70/30 this morning.  Please consider discontinuing 70/30 and ordering Lantus  15 units daily and ordering Novolog  4 units TID with meals for meal coverage if patient eats at least 50% of meals.   NOTE: Patient is known to inpatient diabetes team and last inpatient 10/8-10/13/25. Patient admitted under involuntary commitment due to noncompliance with medications and suicidal ideation.  Thanks, Earnie Gainer, RN, MSN, CDCES Diabetes Coordinator Inpatient Diabetes Program [phone] (Team Pager from 8am to 5pm)

## 2024-09-14 NOTE — Plan of Care (Signed)
  Problem: Education: Goal: Knowledge of [place] [facility] Education information/materials will improve Outcome: Progressing   Problem: Education: Goal: Emotional status will improve Outcome: Progressing   

## 2024-09-14 NOTE — Progress Notes (Signed)
   09/14/24 1600  Psych Admission Type (Psych Patients Only)  Admission Status Involuntary  Psychosocial Assessment  Patient Complaints Anxiety;Nervousness;Restlessness;Sleep disturbance (patient has been worried about getting his correct medication and also stated that he didn't get to sleep until 5 am.)  Eye Contact Fair  Facial Expression Anxious;Worried;Pained (complaints about bilateral foot pain)  Affect Anxious;Preoccupied  Speech Logical/coherent  Interaction Assertive;Demanding;Needy  Motor Activity Restless  Appearance/Hygiene Disheveled;Poor hygiene;In scrubs  Behavior Characteristics Anxious;Restless  Mood Anxious;Preoccupied  Aggressive Behavior  Effect No apparent injury  Thought Process  Coherency WDL  Content Blaming others;Preoccupation  Delusions None reported or observed  Perception WDL  Hallucination None reported or observed  Judgment Poor  Confusion None  Danger to Self  Current suicidal ideation? Denies  Danger to Others  Danger to Others None reported or observed

## 2024-09-14 NOTE — Progress Notes (Signed)
   09/14/24 2000  Psych Admission Type (Psych Patients Only)  Admission Status Involuntary  Psychosocial Assessment  Patient Complaints Worrying  Eye Contact Fair  Facial Expression Anxious;Sad  Affect Preoccupied  Speech Logical/coherent  Interaction Demanding;Needy  Motor Activity Restless  Appearance/Hygiene Disheveled  Behavior Characteristics Anxious;Appropriate to situation  Mood Anxious  Aggressive Behavior  Effect No apparent injury  Thought Process  Coherency WDL  Content Blaming others;Preoccupation  Delusions None reported or observed  Perception WDL  Hallucination None reported or observed  Judgment Poor  Confusion None  Danger to Self  Current suicidal ideation? Denies  Danger to Others  Danger to Others None reported or observed

## 2024-09-14 NOTE — Progress Notes (Signed)
 Patient alert and oriented x 4, he was admitted IVC from the ED for suicidal ideation with plan to shot himself . Patient upon arrival is receptive to staff, he was cooperative with admission process. Patient's thoughts are organized and coherent, affect is congruent with mood. Patient currently denies SI/HI/AVH. Patient skin was assessed in presence of Warren MHT, he was noted to have open sores on the bottom of both feet, that appeared to have serosanguineous drainage, his toe nail on the left leg appeared with eschar,  which is similar on the right foot. Patient endorsed discomfort and  he was assisted with ambulating to bed. 15 minutes safety checks maintained will continue to monitor.

## 2024-09-14 NOTE — BH IP Treatment Plan (Unsigned)
 Interdisciplinary Treatment and Diagnostic Plan Update  09/14/2024 Time of Session: 10:35 Andrew Buchanan MRN: [id number]  Principal Diagnosis: Depression, unspecified  Secondary Diagnoses: Principal Problem:   Depression, unspecified   Current Medications:  Current Facility-Administered Medications  Medication Dose Route Frequency Provider Last Rate Last Admin   acetaminophen  (TYLENOL ) tablet 650 mg  650 mg Oral Q6H PRN Bobbitt, Shalon E, NP   650 mg at 09/14/24 1042   alum & mag hydroxide-simeth (MAALOX/MYLANTA) 200-200-20 MG/5ML suspension 30 mL  30 mL Oral Q4H PRN Bobbitt, Shalon E, NP       collagenase  (SANTYL ) ointment   Topical Daily Jadapalle, Sree, MD       diazepam  (VALIUM ) tablet 5 mg  5 mg Oral BID PRN Bobbitt, Shalon E, NP   5 mg at 09/14/24 0341   haloperidol  (HALDOL ) tablet 5 mg  5 mg Oral TID PRN Bobbitt, Shalon E, NP       And   diphenhydrAMINE  (BENADRYL ) capsule 50 mg  50 mg Oral TID PRN Bobbitt, Shalon E, NP       haloperidol  lactate (HALDOL ) injection 5 mg  5 mg Intramuscular TID PRN Bobbitt, Shalon E, NP       And   diphenhydrAMINE  (BENADRYL ) injection 50 mg  50 mg Intramuscular TID PRN Bobbitt, Shalon E, NP       And   LORazepam  (ATIVAN ) injection 2 mg  2 mg Intramuscular TID PRN Bobbitt, Shalon E, NP       haloperidol  lactate (HALDOL ) injection 10 mg  10 mg Intramuscular TID PRN Bobbitt, Shalon E, NP       And   diphenhydrAMINE  (BENADRYL ) injection 50 mg  50 mg Intramuscular TID PRN Bobbitt, Shalon E, NP       And   LORazepam  (ATIVAN ) injection 2 mg  2 mg Intramuscular TID PRN Bobbitt, Shalon E, NP       gabapentin  (NEURONTIN ) capsule 600 mg  600 mg Oral BID Bobbitt, Shalon E, NP   600 mg at 09/14/24 0900   hydrOXYzine  (ATARAX ) tablet 25 mg  25 mg Oral Q6H PRN Bobbitt, Shalon E, NP       [START ON 09/15/2024] influenza vac split trivalent PF (FLUZONE ) injection 0.5 mL  0.5 mL Intramuscular Tomorrow-1000 Donnelly Mellow, MD       insulin  aspart (novoLOG )  injection 0-15 Units  0-15 Units Subcutaneous TID WC Bobbitt, Shalon E, NP   8 Units at 09/14/24 0843   insulin  aspart (novoLOG ) injection 4 Units  4 Units Subcutaneous TID WC Hunter, Crystal L, PA-C       insulin  glargine (LANTUS ) injection 15 Units  15 Units Subcutaneous Daily Hunter, Crystal L, PA-C       loperamide  (IMODIUM ) capsule 2-4 mg  2-4 mg Oral PRN Bobbitt, Shalon E, NP       LORazepam  (ATIVAN ) tablet 1 mg  1 mg Oral Q6H PRN Bobbitt, Shalon E, NP       LORazepam  (ATIVAN ) tablet 1 mg  1 mg Oral QID Bobbitt, Shalon E, NP   1 mg at 09/14/24 9157   Followed by   [START ON 09/15/2024] LORazepam  (ATIVAN ) tablet 1 mg  1 mg Oral TID Bobbitt, Shalon E, NP       Followed by   NOREEN ON 09/16/2024] LORazepam  (ATIVAN ) tablet 1 mg  1 mg Oral BID Bobbitt, Shalon E, NP       Followed by   NOREEN ON 09/18/2024] LORazepam  (ATIVAN ) tablet 1 mg  1 mg Oral Daily  Bobbitt, Shalon E, NP       magnesium  hydroxide (MILK OF MAGNESIA) suspension 30 mL  30 mL Oral Daily PRN Bobbitt, Shalon E, NP       multivitamin with minerals tablet 1 tablet  1 tablet Oral Daily Bobbitt, Shalon E, NP   1 tablet at 09/14/24 9157   ondansetron  (ZOFRAN -ODT) disintegrating tablet 4 mg  4 mg Oral Q6H PRN Bobbitt, Shalon E, NP       oxyCODONE  (Oxy IR/ROXICODONE ) immediate release tablet 5 mg  5 mg Oral Q8H PRN Bobbitt, Shalon E, NP   5 mg at 09/14/24 0859   thiamine  (VITAMIN B1) injection 100 mg  100 mg Intramuscular Once Bobbitt, Shalon E, NP       [START ON 09/15/2024] thiamine  (VITAMIN B1) tablet 100 mg  100 mg Oral Daily Bobbitt, Shalon E, NP       traZODone  (DESYREL ) tablet 50 mg  50 mg Oral QHS PRN Bobbitt, Shalon E, NP   50 mg at 09/14/24 0341   PTA Medications: Medications Prior to Admission  Medication Sig Dispense Refill Last Dose/Taking   diazepam  (VALIUM ) 10 MG tablet Take 2 tablets (20mg ) in the morning and 1 and 1/2 tablets (15mg ) in the evening. MAX 3 and 1/2 PILLS PER DAY. This med is instead of your clonazepam .  This bottle should last 14 days.   Taking   losartan  (COZAAR ) 100 MG tablet Take 100 mg by mouth daily.   Taking   ondansetron  (ZOFRAN ) 4 MG tablet Take 4 mg by mouth 2 (two) times daily as needed for nausea.   Taking As Needed   oxyCODONE -acetaminophen  (PERCOCET) 10-325 MG tablet Take 1 tablet by mouth every 6 (six) hours as needed (MAX 4 PILLS PER DAY). MAX 4 PILLS PER DAY   Taking As Needed   blood glucose meter kit and supplies KIT Dispense based on patient and insurance preference. Use up to four times daily as directed. 1 each 0    clonazePAM  (KLONOPIN ) 1 MG tablet Take 1 tablet (1 mg total) by mouth 2 (two) times daily as needed for up to 7 days. 14 tablet 0    Continuous Glucose Sensor (DEXCOM G7 SENSOR) MISC Apply to skin as directed. Change every 10 days. 1 each 0    folic acid  (FOLVITE ) 1 MG tablet Take 1 tablet (1 mg total) by mouth daily. 30 tablet 0    gabapentin  (NEURONTIN ) 300 MG capsule Take 2 capsules (600 mg total) by mouth 2 (two) times daily. 30 capsule 1    hydrochlorothiazide  (HYDRODIURIL ) 12.5 MG tablet Take 1 tablet (12.5 mg total) by mouth daily. 30 tablet 0    insulin  lispro (HUMALOG) 100 UNIT/ML injection Inject 5-14 Units into the skin 3 (three) times daily with meals.      insulin  NPH-regular Human (HUMULIN  70/30) (70-30) 100 UNIT/ML injection Inject 18 Units into the skin 2 (two) times daily with a meal. 10 mL 11    Insulin  Pen Needle 31G X 5 MM MISC 1 each by Does not apply route 4 (four) times daily. 100 each 2    sulfamethoxazole -trimethoprim  (BACTRIM  DS) 800-160 MG tablet Take 1 tablet by mouth 2 (two) times daily. 60 tablet 0    thiamine  (VITAMIN B-1) 100 MG tablet Take 1 tablet (100 mg total) by mouth daily. 30 tablet 0     Patient Stressors:    Patient Strengths:    Treatment Modalities: Medication Management, Group therapy, Case management,  1 to 1 session with clinician,  Psychoeducation, Recreational therapy.   Physician Treatment Plan for Primary  Diagnosis: Depression, unspecified Long Term Goal(s):     Short Term Goals:    Medication Management: Evaluate patient's response, side effects, and tolerance of medication regimen.  Therapeutic Interventions: 1 to 1 sessions, Unit Group sessions and Medication administration.  Evaluation of Outcomes: Not Met  Physician Treatment Plan for Secondary Diagnosis: Principal Problem:   Depression, unspecified  Long Term Goal(s):     Short Term Goals:       Medication Management: Evaluate patient's response, side effects, and tolerance of medication regimen.  Therapeutic Interventions: 1 to 1 sessions, Unit Group sessions and Medication administration.  Evaluation of Outcomes: Not Met   RN Treatment Plan for Primary Diagnosis: Depression, unspecified Long Term Goal(s): Knowledge of disease and therapeutic regimen to maintain health will improve  Short Term Goals: Ability to remain free from injury will improve, Ability to verbalize frustration and anger appropriately will improve, Ability to demonstrate self-control, Ability to participate in decision making will improve, Ability to verbalize feelings will improve, Ability to disclose and discuss suicidal ideas, Ability to identify and develop effective coping behaviors will improve, and Compliance with prescribed medications will improve  Medication Management: RN will administer medications as ordered by provider, will assess and evaluate patient's response and provide education to patient for prescribed medication. RN will report any adverse and/or side effects to prescribing provider.  Therapeutic Interventions: 1 on 1 counseling sessions, Psychoeducation, Medication administration, Evaluate responses to treatment, Monitor vital signs and CBGs as ordered, Perform/monitor CIWA, COWS, AIMS and Fall Risk screenings as ordered, Perform wound care treatments as ordered.  Evaluation of Outcomes: Not Met   LCSW Treatment Plan for Primary  Diagnosis: Depression, unspecified Long Term Goal(s): Safe transition to appropriate next level of care at discharge, Engage patient in therapeutic group addressing interpersonal concerns.  Short Term Goals: Engage patient in aftercare planning with referrals and resources, Increase social support, Increase ability to appropriately verbalize feelings, Increase emotional regulation, Facilitate acceptance of mental health diagnosis and concerns, Facilitate patient progression through stages of change regarding substance use diagnoses and concerns, Identify triggers associated with mental health/substance abuse issues, and Increase skills for wellness and recovery  Therapeutic Interventions: Assess for all discharge needs, 1 to 1 time with Social worker, Explore available resources and support systems, Assess for adequacy in community support network, Educate family and significant other(s) on suicide prevention, Complete Psychosocial Assessment, Interpersonal group therapy.  Evaluation of Outcomes: Not Met   Progress in Treatment: Attending groups: No. Participating in groups: No. Taking medication as prescribed: Yes. Toleration medication: Yes. Family/Significant other contact made: No, will contact:  when given permission.  Patient understands diagnosis: Yes. Discussing patient identified problems/goals with staff: Yes. Medical problems stabilized or resolved: Yes. Denies suicidal/homicidal ideation: Yes. Issues/concerns per patient self-inventory: No. Other: none  New problem(s) identified: No, Describe:  none identified.   New Short Term/Long Term Goal(s): medication management for mood stabilization; elimination of SI thoughts; development of comprehensive mental wellness plan.  Patient Goals:  I don't belong here.   Discharge Plan or Barriers: CSW will assist pt with development of an appropriate aftercare/discharge plan.   Reason for Continuation of Hospitalization:  Anxiety Medication stabilization Suicidal ideation  Estimated Length of Stay: 1-7 days  Last 3 [place] Suicide Severity Risk Score: Flowsheet Row Admission (Current) from 09/14/2024 in [facility]ICINE ED from 09/13/2024 in [facility] Emergency Department at [facility] ED to Hosp-Admission (Discharged) from 07/15/2024 in  [facility] 1C MEDICAL TELEMETRY  C-SSRS RISK CATEGORY No Risk No Risk No Risk    Last PHQ 2/9 Scores:     No data to display          Scribe for Treatment Team: Nadara JONELLE Fam, LCSW 09/14/2024 12:41 PM

## 2024-09-14 NOTE — ED Notes (Signed)
 Report called to [facility] and given to [place], CHARITY FUNDRAISER.

## 2024-09-14 NOTE — Consult Note (Addendum)
 WOC Nurse Consult Note: Reason for Consult: Consult requested for bilat feet.  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  Left outer plantar foot full thickness wound with dry eschar, 1X.5X.2cm; no odor, drainage, or fluctuance. Left 3rd tip of toe with dry calloused full thickness wound with eschar, 1.5X1.5cm, no odor, drainage or fluctuance  Right great toe tip with dry calloused full thickness wound with patchy areas of dark red and also areas of eschar,2X4X.2cm.  No odor, drainage, or fluctuance.   Right plantar foot with healing full thickness wound, red and dry 3.5X3.5X.1cm, no drainage.   Dressing procedure/placement/frequency: Topical treatment orders provided for bedside nurses to perform as follows to assist with removal of nonviable tissue: 1. Apply Santyl  to left plantar foot, left 3rd toe and right great toe wounds Q day, then cover with foam dressings. Soila # [phone]) 2. Foam dressing to right plantar foot, change Q 3 days or PRN when soiled.  3.  It is OK for patient to shower without the dressings.   Please re-consult if further assistance is needed.  Thank-you,    Stephane Fought MSN, RN, CWOCN, CWCN-AP, CNS Contact Mon-Fri 0700-1500: [phone]

## 2024-09-14 NOTE — Group Note (Signed)
 Date:  09/14/2024 Time:  3:41 PM  Group Topic/Focus:  Crisis Planning:   The purpose of this group is to help patients create a crisis plan for use upon discharge or in the future, as needed.    Participation Level:  Did Not Attend   Camellia HERO Kaelin Holford 09/14/2024, 3:41 PM

## 2024-09-15 ENCOUNTER — Inpatient Hospital Stay

## 2024-09-15 DIAGNOSIS — F32A Depression, unspecified: Secondary | ICD-10-CM

## 2024-09-15 LAB — GLUCOSE, CAPILLARY
Glucose-Capillary: 308 mg/dL — ABNORMAL HIGH (ref 70–99)
Glucose-Capillary: 72 mg/dL (ref 70–99)

## 2024-09-15 MED ORDER — INSULIN GLARGINE 100 UNIT/ML ~~LOC~~ SOLN
20.0000 [IU] | Freq: Every day | SUBCUTANEOUS | Status: DC
  Administered 2024-09-16: 20 [IU] via SUBCUTANEOUS
  Filled 2024-09-15: qty 0.2

## 2024-09-15 MED ORDER — HYDROCHLOROTHIAZIDE 12.5 MG PO TABS
12.5000 mg | ORAL_TABLET | Freq: Every day | ORAL | Status: DC
  Administered 2024-09-15 – 2024-09-16 (×2): 12.5 mg via ORAL
  Filled 2024-09-15 (×2): qty 1

## 2024-09-15 MED ORDER — GLUCERNA SHAKE PO LIQD
237.0000 mL | Freq: Three times a day (TID) | ORAL | Status: DC
  Administered 2024-09-15 – 2024-09-16 (×4): 237 mL via ORAL

## 2024-09-15 MED ORDER — INSULIN ASPART 100 UNIT/ML IJ SOLN
2.0000 [IU] | Freq: Three times a day (TID) | INTRAMUSCULAR | Status: DC
  Administered 2024-09-15 – 2024-09-16 (×3): 2 [IU] via SUBCUTANEOUS
  Filled 2024-09-15 (×3): qty 2

## 2024-09-15 NOTE — Consult Note (Signed)
 Initial Consultation Note   Patient: Andrew Buchanan FMW:[id number] DOB: 1971/02/02 PCP: Missy Mallie MATSU, MD DOA: 09/14/2024 DOS: the patient was seen and examined on 09/14/2024 Primary service: Donnelly Mellow, MD  Referring physician: Dr. Donnelly Reason for consult: Abnormal vitals/hypertension  Assessment/Plan: Hypertension: mild elevation, will continue home losartan  and add back home hydrochlorothiazide , check bmp tomorrow.  Foot ulcers: appear stable. Will consult wound care. X ray no signs osteo right foot, does have ulcers on left as well. Will check crp/sed. Non-palpable pulses, will check arterial u/s  History staph bacteremia: 2 months ago. Treated with oral linezolid . Patient thinks he may have stopped it early. No fevers. Will repeat blood cultures.  T2DM: hyperglycemic, will increase lantus  to 20, continue ssi, will decrease mealtime to 2 from 4 as before lunch glucose was low normal  Alcohol use disorder: reviewed his CIWA over the last 12 hours. Minimally elevated. Would recommend continuing CIWA with oral ativan  and vitamins  Mood disorder/SI: Management per psychiatry team.  Andrew Buchanan will continue to follow the patient.  HPI: Andrew Buchanan is a 53 y.o. male with past medical history of hypertension, HLD, T2DM, substance use disorder, alcohol use disorder and MDD admitted to hospital for SI concern. Andrew Buchanan consulted given abnormal vitals specifically his hypertension. Today he reports feeling fine, stable foot pain, no fevers  Review of Systems: As mentioned in the history of present illness. All other systems reviewed and are negative. 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 He has never used smokeless tobacco. He reports current alcohol use. He reports current drug use. Drugs: Crack cocaine, Benzodiazepines, and Cocaine.  Allergies  Allergen Reactions   Amlodipine Shortness Of Breath   Antihistamines, Chlorpheniramine-Type Anaphylaxis   Statins Other (See Comments)    Pt declines    Family History  Problem Relation Age of Onset   Alcoholism Father     Prior to Admission medications   Medication Sig Start Date End Date Taking? Authorizing Provider  diazepam  (VALIUM ) 10 MG tablet Take 2 tablets (20mg ) in the morning and 1 and 1/2 tablets (15mg ) in the evening. MAX 3 and 1/2 PILLS PER DAY. This med is instead of your clonazepam . This bottle should last 14 days. 08/18/24  Yes [provider]  gabapentin  (NEURONTIN ) 300 MG capsule Take 2 capsules (600 mg total) by mouth 2 (two) times daily. 05/20/20  Yes Patel, Sona, MD  hydrochlorothiazide  (HYDRODIURIL ) 12.5 MG tablet Take 1 tablet (12.5 mg total) by mouth daily. 07/21/24  Yes Wieting, Richard, MD  insulin  lispro (HUMALOG) 100 UNIT/ML injection Inject 5-14 Units into the skin 3 (three) times daily with  meals. 05/08/24  Yes [provider]  losartan  (COZAAR ) 100 MG tablet Take 100 mg by mouth daily. 08/11/24 08/11/25 Yes [provider]  ondansetron  (ZOFRAN ) 4 MG tablet Take 4 mg by mouth 2 (two) times daily as needed for nausea. 08/11/24 08/11/25 Yes [provider]  oxyCODONE -acetaminophen  (PERCOCET) 10-325 MG tablet Take 1 tablet by mouth every 6 (six) hours as needed (MAX 4 PILLS PER DAY). MAX 4 PILLS PER DAY 08/18/24 09/17/24 Yes [provider]  blood glucose meter kit and supplies KIT Dispense based on patient and insurance preference. Use up to four times daily as directed. 01/16/21   Rojelio Nest, DO  clonazePAM  (KLONOPIN ) 1 MG tablet Take 1 tablet (1 mg total) by mouth 2 (two) times daily as needed for up to 7 days. 06/16/24 07/15/24  Jhonny Calvin NOVAK, MD  Continuous Glucose Sensor (DEXCOM G7 SENSOR) MISC Apply to skin as directed. Change every 10 days. 06/16/24   Jhonny Calvin NOVAK, MD  folic acid  (FOLVITE ) 1 MG tablet Take 1 tablet (1 mg total) by mouth daily. Patient not taking: Reported on 09/14/2024 07/21/24   Josette Ade, MD  insulin  NPH-regular Human (HUMULIN  70/30) (70-30) 100 UNIT/ML injection Inject 18 Units into the skin 2 (two) times daily with a meal. Patient not taking: Reported on 09/14/2024 06/16/24   Jhonny Calvin NOVAK, MD  Insulin  Pen Needle 31G X 5 MM MISC 1 each by Does not apply route 4 (four) times daily. 01/16/21   Rojelio Nest, DO  sulfamethoxazole -trimethoprim  (BACTRIM  DS) 800-160 MG tablet Take 1 tablet by mouth 2 (two) times daily. Patient not taking: Reported on 09/14/2024 07/30/24   Fayette Bodily, MD  thiamine  (VITAMIN B-1) 100 MG tablet Take 1 tablet (100 mg total) by mouth daily. Patient not taking: Reported on 09/14/2024 07/21/24   Josette Ade, MD    Physical Exam: Vitals:   09/14/24 1200 09/14/24 1611 09/15/24 0616 09/15/24 1233  BP: 125/89 (!) 127/104 (!) 134/99 (!) 133/91  Pulse: 98 95 (!) 109 (!) 114  Resp: 18  18   Temp:      TempSrc:      SpO2: 100%  99%    Physical Exam General: NAD HENT: NCAT Lungs: CTAB, no wheeze, rhonchi or rales.  Cardiovascular: Normal heart sounds, no r/m/g, no edema Abdomen: No TTP, normal bowel sounds MSK: No asymmetry or muscle atrophy.  Skin:            Neuro: Alert and oriented x4. CN grossly intact Psych: calm  Data Reviewed:   Results are pending, will review when available.     Primary team communication: We will continue to see patient.  Thank you very much for involving us  in the care of your patient.  Author: Devaughn NOVAK Ban, MD 09/15/2024 4:16 PM  For on call review [email or web address].

## 2024-09-15 NOTE — Progress Notes (Signed)
 [facility] MD Progress Note  09/15/2024 11:07 PM Andrew Buchanan  MRN:  [id number]  primary treatment team. The reason for the telepsychiatric consultation is that the patient is a 53 year old male who presents for psychiatric evaluation on an IVC. Patient is admitted to adult psych unit with Q15 min safety monitoring. Multidisciplinary team approach is offered. Medication management; group/milieu therapy is offered.  Subjective:  Chart reviewed, case discussed in multidisciplinary meeting, patient seen during rounds.  Patient today is requesting discharge.  He denies consistently SI/HI/plan.  His vitals have been stable and he denies any withdrawal symptoms with this provider.  He remains focused on keeping his pain medication and Valium .  Provider educated that with Ativan  taper his Valium  will be kept at lower dose.  Patient was interviewed in the presence of child psychotherapist.  Patient is declining outpatient resources.  He remains future oriented and wants to get his medical workup done for his foot infection.  Patient denies consistently SI/HI/plan and denies hallucinations.   Past Psychiatric History: see h&P Family History:  Family History  Problem Relation Age of Onset   Alcoholism Father    Social History:  Social History   Substance and Sexual Activity  Alcohol Use Yes   Comment: 1 bottle (a pint) of vodka per day.     Social History   Substance and Sexual Activity  Drug Use Yes   Types: Crack cocaine, Benzodiazepines, Cocaine    Social History   Socioeconomic History   Marital status: Single    Spouse name: Not on file   Number of children: Not on file   Years of education: Not on file   Highest education level: Not on file  Occupational History   Not on file  Tobacco Use   Smoking status: Some Days    Types: Cigars   Smokeless tobacco: Never  Substance and Sexual Activity   Alcohol use: Yes    Comment: 1 bottle (a pint) of vodka per day.   Drug use: Yes     Types: Crack cocaine, Benzodiazepines, Cocaine   Sexual activity: Not on file  Other Topics Concern   Not on file  Social History Narrative   Not on file   Social Drivers of Health   Financial Resource Strain: Medium Risk (11/15/2022)   Received from [facility]   Overall Financial Resource Strain (CARDIA)    Difficulty of Paying Living Expenses: Somewhat hard  Food Insecurity: No Food Insecurity (09/14/2024)   Hunger Vital Sign    Worried About Running Out of Food in the Last Year: Never true    Ran Out of Food in the Last Year: Never true  Transportation Needs: No Transportation Needs (09/14/2024)   PRAPARE - Administrator, Civil Service (Medical): No    Lack of Transportation (Non-Medical): No  Physical Activity: Inactive (11/15/2022)   Received from [facility]   Exercise Vital Sign    On average, how many days per week do you engage in moderate to strenuous exercise (like a brisk walk)?: 0 days    On average, how many minutes do you engage in exercise at this level?: 0 min  Stress: Stress Concern Present (11/15/2022)   Received from [facility] - Occupational Stress Questionnaire    Feeling of Stress : Rather much  Social Connections: Unknown (09/14/2024)   Social Connection and Isolation Panel    Frequency of Communication with Friends and  Family: Twice a week    Frequency of Social Gatherings with Friends and Family: Twice a week    Attends Religious Services: 1 to 4 times per year    Active Member of [employer] or Organizations: No    Attends Banker Meetings: Never    Marital Status: Patient declined   Past Medical History:  Past Medical History:  Diagnosis Date   AKI (acute kidney injury) 01/08/2021   Alcohol abuse    Anxiety    Cellulitis and abscess of neck 06/10/2024   Diabetes mellitus without complication (HCC)    Diabetic foot infection (HCC) 11/20/2023   DKA (diabetic ketoacidosis)  (HCC) 05/19/2020   DKA, type 2 (HCC) 06/09/2024   History of MRSA infection 12/08/2023   11/2023 Diabetic foot wound requiring surgical debridement, cultures MRSA+     Hyperosmolar hyperglycemic state (HHS) (HCC) 01/05/2021   Hypertension    Pancreatitis    Sepsis with acute renal failure without septic shock (HCC)    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   Added automatically from request for surgery [id number]      Past Surgical History:  Procedure Laterality Date   HERNIA REPAIR     ROTATOR CUFF REPAIR Left     Current Medications: Current Facility-Administered Medications  Medication Dose Route Frequency Provider Last Rate Last Admin   acetaminophen  (TYLENOL ) tablet 650 mg  650 mg Oral Q6H PRN Bobbitt, Shalon E, NP   650 mg at 09/15/24 1807   alum & mag hydroxide-simeth (MAALOX/MYLANTA) 200-200-20 MG/5ML suspension 30 mL  30 mL Oral Q4H PRN Bobbitt, Shalon E, NP   30 mL at 09/14/24 2037   collagenase  (SANTYL ) ointment   Topical Daily [name], MD   Given at 09/14/24 2007   diazepam  (VALIUM ) tablet 2 mg  2 mg Oral Q12H PRN [name], MD   2 mg at 09/15/24 1549   haloperidol  (HALDOL ) tablet 5 mg  5 mg Oral TID PRN Bobbitt, Shalon E, NP       And   diphenhydrAMINE  (BENADRYL ) capsule 50 mg  50 mg Oral TID PRN Bobbitt, Shalon E, NP       haloperidol  lactate (HALDOL ) injection 5 mg  5 mg Intramuscular TID PRN Bobbitt, Shalon E, NP       And   diphenhydrAMINE  (BENADRYL ) injection 50 mg  50 mg Intramuscular TID PRN Bobbitt, Shalon E, NP       And   LORazepam  (ATIVAN ) injection 2 mg  2 mg Intramuscular TID PRN Bobbitt, Shalon E, NP       haloperidol  lactate (HALDOL ) injection 10 mg  10 mg Intramuscular TID PRN Bobbitt, Shalon E, NP       And   diphenhydrAMINE  (BENADRYL ) injection 50 mg  50 mg Intramuscular TID PRN Bobbitt, Shalon E, NP       And   LORazepam  (ATIVAN ) injection 2 mg  2 mg Intramuscular TID PRN Bobbitt, Shalon E, NP       feeding supplement (GLUCERNA SHAKE)  (GLUCERNA SHAKE) liquid 237 mL  237 mL Oral TID BM [name], MD   237 mL at 09/15/24 2043   gabapentin  (NEURONTIN ) capsule 600 mg  600 mg Oral BID Bobbitt, Shalon E, NP   600 mg at 09/15/24 1807   hydrochlorothiazide  (HYDRODIURIL ) tablet 12.5 mg  12.5 mg Oral Daily Kandis Devaughn Sayres, MD   12.5 mg at 09/15/24 1807   hydrOXYzine  (ATARAX ) tablet 25 mg  25 mg Oral Q6H PRN Bobbitt, Shalon E, NP  influenza vac split trivalent PF (FLUZONE ) injection 0.5 mL  0.5 mL Intramuscular Tomorrow-1000 Donnelly Mellow, MD       insulin  aspart (novoLOG ) injection 0-15 Units  0-15 Units Subcutaneous TID WC Bobbitt, Shalon E, NP   3 Units at 09/15/24 1809   insulin  aspart (novoLOG ) injection 2 Units  2 Units Subcutaneous TID WC Wouk, Devaughn Sayres, MD   2 Units at 09/15/24 1810   [START ON 09/16/2024] insulin  glargine (LANTUS ) injection 20 Units  20 Units Subcutaneous Daily Wouk, Devaughn Sayres, MD       loperamide  (IMODIUM ) capsule 2-4 mg  2-4 mg Oral PRN Bobbitt, Shalon E, NP       LORazepam  (ATIVAN ) tablet 1 mg  1 mg Oral Q6H PRN Bobbitt, Shalon E, NP       LORazepam  (ATIVAN ) tablet 1 mg  1 mg Oral TID Bobbitt, Shalon E, NP   1 mg at 09/15/24 1807   Followed by   [START ON 09/16/2024] LORazepam  (ATIVAN ) tablet 1 mg  1 mg Oral BID Bobbitt, Shalon E, NP       Followed by   NOREEN ON 09/18/2024] LORazepam  (ATIVAN ) tablet 1 mg  1 mg Oral Daily Bobbitt, Shalon E, NP       losartan  (COZAAR ) tablet 100 mg  100 mg Oral Daily Khan, Ghalib, MD   100 mg at 09/15/24 9093   magnesium  hydroxide (MILK OF MAGNESIA) suspension 30 mL  30 mL Oral Daily PRN Bobbitt, Shalon E, NP       multivitamin with minerals tablet 1 tablet  1 tablet Oral Daily Bobbitt, Shalon E, NP   1 tablet at 09/15/24 0905   ondansetron  (ZOFRAN -ODT) disintegrating tablet 4 mg  4 mg Oral Q6H PRN Bobbitt, Shalon E, NP   4 mg at 09/15/24 1008   oxyCODONE  (Oxy IR/ROXICODONE ) immediate release tablet 5 mg  5 mg Oral Q8H PRN Bobbitt, Shalon E, NP   5 mg at  09/15/24 2042   thiamine  (VITAMIN B1) injection 100 mg  100 mg Intramuscular Once Bobbitt, Shalon E, NP       thiamine  (VITAMIN B1) tablet 100 mg  100 mg Oral Daily Bobbitt, Shalon E, NP   100 mg at 09/15/24 9094   traZODone  (DESYREL ) tablet 50 mg  50 mg Oral QHS PRN Bobbitt, Shalon E, NP   50 mg at 09/15/24 2042    Lab Results:  Results for orders placed or performed during the hospital encounter of 09/14/24 (from the past 48 hours)  Glucose, capillary     Status: Abnormal   Collection Time: 09/14/24  2:02 AM  Result Value Ref Range   Glucose-Capillary 365 (H) 70 - 99 mg/dL    Comment: Glucose reference range applies only to samples taken after fasting for at least 8 hours.   Comment 1 Notify RN   Glucose, capillary     Status: Abnormal   Collection Time: 09/14/24  7:44 AM  Result Value Ref Range   Glucose-Capillary 291 (H) 70 - 99 mg/dL    Comment: Glucose reference range applies only to samples taken after fasting for at least 8 hours.  Glucose, capillary     Status: Abnormal   Collection Time: 09/14/24 12:14 PM  Result Value Ref Range   Glucose-Capillary 131 (H) 70 - 99 mg/dL    Comment: Glucose reference range applies only to samples taken after fasting for at least 8 hours.  Glucose, capillary     Status: Abnormal   Collection Time: 09/14/24  4:20  PM  Result Value Ref Range   Glucose-Capillary 167 (H) 70 - 99 mg/dL    Comment: Glucose reference range applies only to samples taken after fasting for at least 8 hours.  Glucose, capillary     Status: Abnormal   Collection Time: 09/14/24  7:53 PM  Result Value Ref Range   Glucose-Capillary 150 (H) 70 - 99 mg/dL    Comment: Glucose reference range applies only to samples taken after fasting for at least 8 hours.  Glucose, capillary     Status: Abnormal   Collection Time: 09/15/24  7:58 AM  Result Value Ref Range   Glucose-Capillary 308 (H) 70 - 99 mg/dL    Comment: Glucose reference range applies only to samples taken after  fasting for at least 8 hours.  Glucose, capillary     Status: None   Collection Time: 09/15/24 11:40 AM  Result Value Ref Range   Glucose-Capillary 72 70 - 99 mg/dL    Comment: Glucose reference range applies only to samples taken after fasting for at least 8 hours.    Blood Alcohol level:  Lab Results  Component Value Date   ETH 308 (HH) 09/13/2024   ETH 326 (HH) 07/15/2024    Metabolic Disorder Labs: Lab Results  Component Value Date   HGBA1C 9.5 (H) 06/10/2024   MPG 226 06/10/2024   MPG 148.46 08/25/2021   No results found for: PROLACTIN No results found for: CHOL, TRIG, HDL, CHOLHDL, VLDL, LDLCALC  Physical Findings: AIMS:  , ,  ,  ,    CIWA:  CIWA-Ar Total: 11 COWS:      Psychiatric Specialty Exam:  Presentation  General Appearance:  Appropriate for Environment  Eye Contact: Good  Speech: Clear and Coherent  Speech Volume: Normal    Mood and Affect  Mood: Irritable  Affect: Congruent   Thought Process  Thought Processes: Coherent  Orientation:Full (Time, Place and Person)  Thought Content:WDL  Hallucinations:Hallucinations: None  Ideas of Reference:None  Suicidal Thoughts:Suicidal Thoughts: No  Homicidal Thoughts:Homicidal Thoughts: No   Sensorium  Memory: Immediate Fair; Recent Fair  Judgment: Poor  Insight: Lacking   Executive Functions  Concentration: Fair  Attention Span: Fair  Recall: [employer] of Knowledge: Fair  Language: Fair   Psychomotor Activity  Psychomotor Activity: Psychomotor Activity: Normal  Musculoskeletal: Strength & Muscle Tone: within normal limits Gait & Station: normal Assets  Assets: Manufacturing Systems Engineer; Financial Resources/Insurance; Housing; Social Support    Physical Exam: Physical Exam ROS Blood pressure (!) 146/107, pulse 84, temperature 99.1 F (37.3 C), temperature source Oral, resp. rate 18, SpO2 94%. There is no height or weight on file to  calculate BMI.  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 Psychiatric Diagnoses and Treatment:  Valium  2 mg every 12 hours PRN for anxiety - lower dose due to current lorazepam  taper          Lorazepam  taper for alcohol withdrawal management  CIWA protocol    -- The risks/benefits/side-effects/alternatives to this medication were discussed in detail with the patient and time was given for questions. The patient consents to medication trial.                -- Metabolic profile and EKG monitoring obtained while on an atypical antipsychotic (BMI: Lipid Panel: HbgA1c: QTc:)              -- Encouraged patient to participate in unit milieu and in scheduled group therapies                            3. Medical Issues Being Addressed:  - Diabetes coordinator consulted  - Wound care consulted for foot ulcers - Hospitalist consult placed due to elevated BP and increased HR in context of alcohol withdrawal to assess need for IV ativan /medical detox for management of alcohol withdrawal - Home medication oxycodone  continued at lower dose due to current lorazepam  taper and Valium     -- The risks/benefits/side-effects/alternatives to this medication were discussed in detail with the patient and time was given for questions. The patient consents to medication trial.  3. Medical Issues Being Addressed:     4.  Discharge Planning:   -- Social work and case management to assist with discharge planning and identification of hospital follow-up needs prior to discharge  -- Estimated LOS: 3-4 days  Elai Vanwyk, MD 09/15/2024, 11:07 PM

## 2024-09-15 NOTE — Group Note (Signed)
 [facility] LCSW Group Therapy Note   Group Date: 09/15/2024 Start Time: 1315 End Time: 1400  Type of Therapy/Topic:  Group Therapy:  Feelings about Diagnosis  Participation Level:  Active   Description of Group:    This group will allow patients to explore their thoughts and feelings about diagnoses they have received. Patients will be guided to explore their level of understanding and acceptance of these diagnoses. Facilitator will encourage patients to process their thoughts and feelings about the reactions of others to their diagnosis, and will guide patients in identifying ways to discuss their diagnosis with significant others in their lives. This group will be process-oriented, with patients participating in exploration of their own experiences as well as giving and receiving support and challenge from other group members.   Therapeutic Goals: 1. Patient will demonstrate understanding of diagnosis as evidence by identifying two or more symptoms of the disorder:  2. Patient will be able to express two feelings regarding the diagnosis 3. Patient will demonstrate ability to communicate their needs through discussion and/or role plays  Summary of Patient Progress: Patient was present in group.  Patient did participate in group.  Patient was somewhat argumentative in group, however was redirectable.  Insight appeared poor.  Therapeutic Modalities:   Cognitive Behavioral Therapy Brief Therapy Feelings Identification    Sherryle JINNY Margo, LCSW

## 2024-09-15 NOTE — Group Note (Signed)
 Recreation Therapy Group Note   Group Topic:Health and Wellness  Group Date: 09/15/2024 Start Time: 1525 End Time: 1555 Facilitators: Celestia Jeoffrey BRAVO, LRT, CTRS Location: Back Dayroom  Group Description: Light Exercise & Stretching. LRT discussed the mental and physical benefits of exercise. LRT and group discussed how physical activity can be used as a coping skill. LRT lead patients in a light exercise and stretching routine, with music being played in the background. Pt's encouraged to listen to their bodies and stop at any time if they experience feelings of discomfort or pain. Pts were encouraged to drink water  and stay hydrated.   Goal Area(s) Addressed:  Patient will learn benefits of physical activity. Patient will identify exercise as a coping skill.  Patient will follow multistep directions. Patient will try a new leisure interest.   Affect/Mood: N/A   Participation Level: Did not attend    Clinical Observations/Individualized Feedback: Patient did not attend group.   Plan: Continue to engage patient in RT group sessions 2-3x/week.   Jeoffrey BRAVO Celestia, LRT, CTRS 09/15/2024 5:00 PM

## 2024-09-15 NOTE — Group Note (Signed)
 Date:  09/15/2024 Time:  10:19 AM  Group Topic/Focus:  Pet Therapy: [name] the Dog has come to visit the unit!     Participation Level:  Active  Participation Quality:  Appropriate  Affect:  Appropriate  Cognitive:  Appropriate  Insight: Appropriate  Engagement in Group:  Engaged  Modes of Intervention:  Activity  Additional Comments:    Andrew Buchanan HERO Arden Tinoco 09/15/2024, 10:19 AM

## 2024-09-15 NOTE — Progress Notes (Signed)
 D- Patient alert and oriented x 3. Pt not accepting of situation. Pt presents with a demanding and needy  mood and affect. Pt  denies SI, HI, AVH, but endorses pain 9/10 for rt foot. Pt states the only thing that helps is Percocet and Ativan  and states he is not being provided a high enough dose of either to help with his discomfort. Pain management and non- pharmacological methods discussed and education provided to pt. Pt states his goal is to be discharged. Pt expected to discharge tomorrow. Pt watching the time and requesting Ativan  and Percocet as soon as possible for admin.   A- Scheduled and PRN medications and administered to patient, per MD orders. Support and encouragement provided.  Routine safety checks conducted every 15 minutes.  Patient informed to notify staff with problems or concerns. Provider alerted due to BS dropping after admin insulin  administration. pt BS is 72 at 11:40am. pt received sliding scale Novolog  and standing Novolog  as well as Lantus  after breakfast this morning. pt also had a Glucerna. pt is not getting any meal coverage insulin  after lunch due to BS being 72 and not applicable for order parameters. Novolog  and Lantus  ordered reassessed and modified.   R- No adverse drug reactions noted. Patient contracts for safety at this time. Patient compliant with medications and treatment plan. Patient receptive, calm, and cooperative. Patient interacts well with others on the unit.  Patient remains safe at this time.    Takeria Marquina S.,RN

## 2024-09-15 NOTE — [facility] Suicide Risk Assessment (Signed)
 [facility] INPATIENT:  Family/Significant Other Suicide Prevention Education  Suicide Prevention Education:  Patient Refusal for Family/Significant Other Suicide Prevention Education: The patient Andrew Buchanan has refused to provide written consent for family/significant other to be provided Family/Significant Other Suicide Prevention Education during admission and/or prior to discharge.  Physician notified.  SPE completed with pt, as pt refused to consent to family contact. SPI pamphlet provided to pt and pt was encouraged to share information with support network, ask questions, and talk about any concerns relating to SPE. Pt denies access to guns/firearms and verbalized understanding of information provided. Mobile Crisis information also provided to pt.  Nadara JONELLE Fam 09/15/2024, 11:18 AM

## 2024-09-15 NOTE — Consult Note (Addendum)
 Initial Consultation Note   Patient: Andrew Buchanan DOB: [birth year] PCP: Missy Mallie MATSU, MD DOA: 09/14/2024 DOS: the patient was seen and examined on 09/14/2024 Primary service: Donnelly Mellow, MD  Referring physician: Dr. Donnelly Reason for consult: Abnormal vitals/hypertension  Assessment/Plan: Hypertension: pt with hx of hypertension on losartan  100 mg daily. This medication was held currently. Will restart as this is leading to his current hypertension. There was concern that alcohol withdrawal was leading to this but that appears unlikely from the exam as pt is calm. Will monitor blood pressure on losartan  100 mg every day. It may take some time to take effect given he reports he has not taken if for two days.    Foot ulcers: appear stable. Wound care consulted. Pt anxious about developing infection but discussed that given he is not having discharge, erythema or any fevers or chills this was very unlikely. He request imaging for this. Will order right foot xray.   T2DM: continue home insulin . Monitor CBG with meals and at bedtime. Continue gabapentin  for neuropathy.   Alcohol use disorder: reviewed his CIWA over the last 12 hours. Minimally elevated. Would recommend continuing CIWA with oral ativan .   Mood disorder/SI: Management per psychiatry team.  TRH will continue to follow the patient.  HPI: Andrew Buchanan is a 53 y.o. male with past medical history of hypertension, HLD, T2DM, substance use disorder, alcohol use disorder and MDD admitted to hospital for SI concern. TRH consulted given abnormal vitals specifically his hypertension.   Review of Systems: As mentioned in the history of present illness. All other systems reviewed and are negative. 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 He has never used smokeless tobacco. He reports current alcohol use. He reports current drug use. Drugs: Crack cocaine, Benzodiazepines, and Cocaine.  Allergies  Allergen Reactions   Amlodipine Shortness Of Breath   Antihistamines, Chlorpheniramine-Type Anaphylaxis   Statins Other (See Comments)    Pt declines    Family History  Problem Relation Age of Onset   Alcoholism Father     Prior to Admission medications   Medication Sig Start Date End Date Taking? Authorizing Provider  diazepam  (VALIUM ) 10 MG tablet Take 2 tablets (20mg ) in the morning and 1 and 1/2 tablets (15mg ) in the evening. MAX 3 and 1/2 PILLS PER DAY. This med is instead of your clonazepam . This bottle should last 14 days. 08/18/24  Yes [provider]  gabapentin  (NEURONTIN ) 300 MG capsule Take 2 capsules (600 mg total) by mouth 2 (two) times daily. 05/20/20  Yes Patel, Sona, MD  hydrochlorothiazide  (HYDRODIURIL ) 12.5 MG tablet Take  1 tablet (12.5 mg total) by mouth daily. 07/21/24  Yes Wieting, Richard, MD  insulin  lispro (HUMALOG) 100 UNIT/ML injection Inject 5-14 Units into the skin 3 (three) times daily with meals. 05/08/24  Yes [provider]  losartan  (COZAAR ) 100 MG tablet Take 100 mg by mouth daily. 08/11/24 08/11/25 Yes [provider]  ondansetron   (ZOFRAN ) 4 MG tablet Take 4 mg by mouth 2 (two) times daily as needed for nausea. 08/11/24 08/11/25 Yes [provider]  oxyCODONE -acetaminophen  (PERCOCET) 10-325 MG tablet Take 1 tablet by mouth every 6 (six) hours as needed (MAX 4 PILLS PER DAY). MAX 4 PILLS PER DAY 08/18/24 09/17/24 Yes [provider]  blood glucose meter kit and supplies KIT Dispense based on patient and insurance preference. Use up to four times daily as directed. 01/16/21   Rojelio Nest, DO  clonazePAM  (KLONOPIN ) 1 MG tablet Take 1 tablet (1 mg total) by mouth 2 (two) times daily as needed for up to 7 days. 06/16/24 07/15/24  Jhonny Calvin NOVAK, MD  Continuous Glucose Sensor (DEXCOM G7 SENSOR) MISC Apply to skin as directed. Change every 10 days. 06/16/24   Jhonny Calvin NOVAK, MD  folic acid  (FOLVITE ) 1 MG tablet Take 1 tablet (1 mg total) by mouth daily. Patient not taking: Reported on 09/14/2024 07/21/24   Josette Ade, MD  insulin  NPH-regular Human (HUMULIN  70/30) (70-30) 100 UNIT/ML injection Inject 18 Units into the skin 2 (two) times daily with a meal. Patient not taking: Reported on 09/14/2024 06/16/24   Jhonny Calvin NOVAK, MD  Insulin  Pen Needle 31G X 5 MM MISC 1 each by Does not apply route 4 (four) times daily. 01/16/21   Rojelio Nest, DO  sulfamethoxazole -trimethoprim  (BACTRIM  DS) 800-160 MG tablet Take 1 tablet by mouth 2 (two) times daily. Patient not taking: Reported on 09/14/2024 07/30/24   Fayette Bodily, MD  thiamine  (VITAMIN B-1) 100 MG tablet Take 1 tablet (100 mg total) by mouth daily. Patient not taking: Reported on 09/14/2024 07/21/24   Josette Ade, MD    Physical Exam: Vitals:   09/14/24 0155 09/14/24 0919 09/14/24 1200 09/14/24 1611  BP: (!) 124/103 (!) 135/94 125/89 (!) 127/104  Pulse: (!) 123 (!) 110 98 95  Resp: 16  18   Temp: 99.1 F (37.3 C)     TempSrc: Oral     SpO2: 100% 100% 100%    Physical Exam General: NAD HENT: NCAT Lungs: CTAB, no wheeze, rhonchi or  rales.  Cardiovascular: Normal heart sounds, no r/m/g, 2+ pulses in all extremities. No LE edema Abdomen: No TTP, normal bowel sounds MSK: No asymmetry or muscle atrophy.  Skin: no lesions noted on exposed skin Neuro: Alert and oriented x4. CN grossly intact Psych: Normal mood and normal affect  Data Reviewed:   Results are pending, will review when available.    Family Communication: Updated at bedside.  Primary team communication: We will continue to see patient.  Thank you very much for involving us  in the care of your patient.  Author: Morene Bathe, MD 09/15/2024 12:42 AM  For on call review [email or web address].

## 2024-09-15 NOTE — Progress Notes (Signed)
   09/15/24 2100  Psych Admission Type (Psych Patients Only)  Admission Status Involuntary  Psychosocial Assessment  Patient Complaints Worrying;Anxiety  Eye Contact Fair  Facial Expression Animated  Affect Preoccupied (Patient preoccupied with receiving his prn medications at ordered intervals)  Speech Logical/coherent  Interaction Demanding;Needy  Motor Activity Restless  Appearance/Hygiene In scrubs;Poor hygiene  Behavior Characteristics Restless  Mood Preoccupied (Patient preoccupied with recieving prn medications at ordered intervals)  Thought Process  Coherency Circumstantial  Content Blaming others;Preoccupation  Delusions None reported or observed  Perception WDL  Hallucination None reported or observed  Judgment Poor  Confusion None  Danger to Self  Current suicidal ideation? Denies  Self-Injurious Behavior No self-injurious ideation or behavior indicators observed or expressed   Danger to Others  Danger to Others None reported or observed

## 2024-09-15 NOTE — Plan of Care (Signed)
  Problem: Education: Goal: Knowledge of [facility] Education information/materials will improve Outcome: Progressing Goal: Emotional status will improve Outcome: Progressing Goal: Mental status will improve Outcome: Progressing Goal: Verbalization of understanding the information provided will improve Outcome: Progressing   Problem: Activity: Goal: Interest or engagement in activities will improve Outcome: Progressing Goal: Sleeping patterns will improve Outcome: Progressing   Problem: Coping: Goal: Ability to verbalize frustrations and anger appropriately will improve Outcome: Progressing Goal: Ability to demonstrate self-control will improve Outcome: Progressing   Problem: Health Behavior/Discharge Planning: Goal: Identification of resources available to assist in meeting health care needs will improve Outcome: Progressing Goal: Compliance with treatment plan for underlying cause of condition will improve Outcome: Progressing   Problem: Physical Regulation: Goal: Ability to maintain clinical measurements within normal limits will improve Outcome: Progressing   Problem: Safety: Goal: Periods of time without injury will increase Outcome: Progressing   Problem: Education: Goal: Ability to describe self-care measures that may prevent or decrease complications (Diabetes Survival Skills Education) will improve Outcome: Progressing Goal: Individualized Educational Video(s) Outcome: Progressing   Problem: Coping: Goal: Ability to adjust to condition or change in health will improve Outcome: Progressing   Problem: Fluid Volume: Goal: Ability to maintain a balanced intake and output will improve Outcome: Progressing   Problem: Health Behavior/Discharge Planning: Goal: Ability to identify and utilize available resources and services will improve Outcome: Progressing Goal: Ability to manage health-related needs will improve Outcome: Progressing   Problem:  Metabolic: Goal: Ability to maintain appropriate glucose levels will improve Outcome: Progressing   Problem: Nutritional: Goal: Maintenance of adequate nutrition will improve Outcome: Progressing Goal: Progress toward achieving an optimal weight will improve Outcome: Progressing   Problem: Skin Integrity: Goal: Risk for impaired skin integrity will decrease Outcome: Progressing   Problem: Tissue Perfusion: Goal: Adequacy of tissue perfusion will improve Outcome: Progressing

## 2024-09-15 NOTE — BHH Counselor (Signed)
 Psych provider and CSW met with pt to discuss discharge. He shared that he will be returning home and a friend, Delon Satchel, would be transporting him there. He gave team her contact information to confirm. Pt and CSW discussed aftercare. Pt agreeable to scheduled appointment as long as it is virtual. CSW shared that we could get him a virtual appointment. He agreed. Pt and psych provider discussed medications briefly. No concerns expressed. Contact ended without incident.   CSW met with pt to collect consent for contact and aftercare. Consents/authorizations were completed.   CSW contacted friend, Delon Satchel [phone]) to confirm transportation for pt. She confirmed. She was supplied with address and instructions for picking up the pt. No other concerns expressed. Contact ended without incident.   Nadara SAUNDERS. Chaim, MSW, LCSW, LCAS 09/15/2024 4:00 PM

## 2024-09-15 NOTE — Plan of Care (Signed)
  Problem: Education: Goal: Emotional status will improve Outcome: Progressing Goal: Mental status will improve Outcome: Progressing   Problem: Physical Regulation: Goal: Ability to maintain clinical measurements within normal limits will improve Outcome: Progressing   Problem: Safety: Goal: Periods of time without injury will increase Outcome: Progressing   

## 2024-09-15 NOTE — Group Note (Signed)
 Date:  09/15/2024 Time:  9:31 PM  Group Topic/Focus:  Emotional Education:   The focus of this group is to discuss what feelings/emotions are, and how they are experienced. Wrap-Up Group:   The focus of this group is to help patients review their daily goal of treatment and discuss progress on daily workbooks.    Participation Level:  Active  Participation Quality:  Appropriate and Attentive  Affect:  Appropriate  Cognitive:  Alert and Appropriate  Insight: Appropriate and Good  Engagement in Group:  Engaged  Modes of Intervention:  Activity  Additional Comments:     Arlester CHRISTELLA Servant 09/15/2024, 9:31 PM

## 2024-09-15 NOTE — Plan of Care (Signed)
  Problem: Nutritional: Goal: Maintenance of adequate nutrition will improve Outcome: Progressing   Problem: Education: Goal: Verbalization of understanding the information provided will improve Outcome: Not Progressing   Problem: Education: Goal: Ability to describe self-care measures that may prevent or decrease complications (Diabetes Survival Skills Education) will improve Outcome: Not Progressing

## 2024-09-15 NOTE — Progress Notes (Signed)
 Pt refused to shower. Pt states foot wound is smelling. Encouragement for showering provided. Pt continues to refuse. Pt states.  I will take a shower at home. Last shower 2 days ago per pt. Pt refused writer to perform wound care and re-dress wound. Writer present while pt dressed wound. Medicated ointment applied. Writer checked dressing to ensure tape is not tight. Pt fixated on family money and states,  I will be a millionaire in 30 days. Pt at the nurses station upset because nephew states he is will not give pt any of his 5 million dollars due to pt being an addict. Pt states he is not a drug addict and he only takes his prescribed Percocet. Pt wanting clinical research associate and other nurse to speak with nephew to tell nephew that pt is not an addict. Writer explained to pt that staff is not able to speak with pt nephew to say pt is not an addict or able to get into family issues. Pt calls nephew back and briefly argues with nephew on the phone.   Aerica Rincon S.,RN

## 2024-09-15 NOTE — BHH Counselor (Signed)
 During completion of the PSA, pt declined interest in outpatient mental health follow up, stating that he does not need it.   Nadara SAUNDERS. Chaim, MSW, LCSW, LCAS 09/15/2024 11:19 AM

## 2024-09-15 NOTE — Group Note (Signed)
 Recreation Therapy Group Note   Group Topic:Healthy Support Systems  Group Date: 09/15/2024 Start Time: 1000 End Time: 1040 Facilitators: Celestia Jeoffrey BRAVO, LRT, CTRS Location: Craft Room  Group Description: Straw Bridge. In groups or individually, patients were given 10 plastic drinking straws and an equal length of masking tape. Using the materials provided, patients were instructed to build a free-standing bridge-like structure to suspend an everyday item (ex: deck of cards) off the floor or table surface. All materials were required to be used in secondary school teacher. LRT facilitated post-activity discussion reviewing the importance of having strong and healthy support systems in our lives. LRT discussed how the people in our lives serve as the tape and the deck of cards we placed on top of our straw structure are the stressors we face in daily life. LRT and pts discussed what happens in our life when things get too heavy for us , and we don't have strong supports outside of the hospital. Pt shared 2 of their healthy supports in their life aloud in the group.   Goal Area(s) Addressed:  Patient will identify 2 healthy supports in their life. Patient will identify skills to successfully complete activity. Patient will identify correlation of this activity to life post-discharge.  Patient will build on frustration tolerance skills. Patient will increase team building and communication skills.    Affect/Mood: Full range   Participation Level: Minimal    Clinical Observations/Individualized Feedback: Andrew Buchanan came late to group. Pt was adamant that LRT gave him credit for being in group. Pt shared that his mother, daughter, grandkids, and the money are healthy supports. Pt did not make a straw structure. Pt left early saying he needed to go make a phone call.   Plan: Continue to engage patient in RT group sessions 2-3x/week.   Jeoffrey BRAVO Celestia, LRT, CTRS 09/15/2024 11:14 AM

## 2024-09-15 NOTE — BH Assessment (Signed)
 Patient's dressing was changed during the shift. Patient reports that Ativan  is not effective for him. He continues to exhibit medication-seeking behavior, repeatedly requesting additional medications. Patient remained awake until 0330 requesting Valium  and oxycodone . Redirection and education regarding appropriate medication use were provided. Patient had no additional questions or concerns at this time.

## 2024-09-15 NOTE — Inpatient Diabetes Management (Signed)
 Inpatient Diabetes Program Recommendations  AACE/ADA: New Consensus Statement on Inpatient Glycemic Control   Target Ranges:  Prepandial:   less than 140 mg/dL      Peak postprandial:   less than 180 mg/dL (1-2 hours)      Critically ill patients:  140 - 180 mg/dL    Latest Reference Range & Units 09/14/24 07:44 09/14/24 12:14 09/14/24 16:20 09/14/24 19:53 09/15/24 07:58  Glucose-Capillary 70 - 99 mg/dL 708 (H) 868 (H) 832 (H) 150 (H) 308 (H)   Review of Glycemic Control  Diabetes history: DM2 Outpatient Diabetes medications: Humulin  70/30 18 units BID, Actos  15 mg daily, Dexcom G7 Current orders for Inpatient glycemic control: Lantus  15 units daily, Novolog  0-15 units TID with meals, Novolog  4 units TID with meals   Inpatient Diabetes Program Recommendations:     Insulin : Please consider increasing Lantus  to 20 units daily.  Thanks, Earnie Gainer, RN, MSN, CDCES Diabetes Coordinator Inpatient Diabetes Program [phone] (Team Pager from 8am to 5pm)

## 2024-09-16 ENCOUNTER — Encounter: Payer: Self-pay | Admitting: Infectious Diseases

## 2024-09-16 ENCOUNTER — Other Ambulatory Visit (HOSPITAL_COMMUNITY): Payer: Self-pay

## 2024-09-16 ENCOUNTER — Other Ambulatory Visit: Payer: Self-pay

## 2024-09-16 ENCOUNTER — Telehealth (HOSPITAL_COMMUNITY): Payer: Self-pay

## 2024-09-16 DIAGNOSIS — F1994 Other psychoactive substance use, unspecified with psychoactive substance-induced mood disorder: Secondary | ICD-10-CM

## 2024-09-16 DIAGNOSIS — F10929 Alcohol use, unspecified with intoxication, unspecified: Secondary | ICD-10-CM

## 2024-09-16 DIAGNOSIS — I10 Essential (primary) hypertension: Secondary | ICD-10-CM

## 2024-09-16 DIAGNOSIS — L97529 Non-pressure chronic ulcer of other part of left foot with unspecified severity: Secondary | ICD-10-CM

## 2024-09-16 DIAGNOSIS — L97519 Non-pressure chronic ulcer of other part of right foot with unspecified severity: Secondary | ICD-10-CM

## 2024-09-16 LAB — BASIC METABOLIC PANEL WITH GFR
Anion gap: 10 (ref 5–15)
BUN: 18 mg/dL (ref 6–20)
CO2: 25 mmol/L (ref 22–32)
Calcium: 9.1 mg/dL (ref 8.9–10.3)
Chloride: 98 mmol/L (ref 98–111)
Creatinine, Ser: 1.15 mg/dL (ref 0.61–1.24)
GFR, Estimated: >60 mL/min (ref >60)
Glucose, Bld: 250 mg/dL — ABNORMAL HIGH (ref 70–99)
Potassium: 4.7 mmol/L (ref 3.5–5.1)
Sodium: 134 mmol/L — ABNORMAL LOW (ref 135–145)

## 2024-09-16 LAB — C-REACTIVE PROTEIN: CRP: <0.5 mg/dL (ref <1.0)

## 2024-09-16 LAB — CBC
HCT: 28.4 % — ABNORMAL LOW (ref 39.0–52.0)
Hemoglobin: 9.9 g/dL — ABNORMAL LOW (ref 13.0–17.0)
MCH: 35.9 pg — ABNORMAL HIGH (ref 26.0–34.0)
MCHC: 34.9 g/dL (ref 30.0–36.0)
MCV: 102.9 fL — ABNORMAL HIGH (ref 80.0–100.0)
Platelets: 78 K/uL — ABNORMAL LOW (ref 150–400)
RBC: 2.76 MIL/uL — ABNORMAL LOW (ref 4.22–5.81)
RDW: 18.3 % — ABNORMAL HIGH (ref 11.5–15.5)
WBC: 6.6 K/uL (ref 4.0–10.5)
nRBC: 0 % (ref 0.0–0.2)

## 2024-09-16 LAB — SEDIMENTATION RATE: Sed Rate: 32 mm/h — ABNORMAL HIGH (ref 0–20)

## 2024-09-16 LAB — GLUCOSE, CAPILLARY
Glucose-Capillary: 168 mg/dL — ABNORMAL HIGH (ref 70–99)
Glucose-Capillary: 130 mg/dL — ABNORMAL HIGH (ref 70–99)
Glucose-Capillary: 454 mg/dL — ABNORMAL HIGH (ref 70–99)
Glucose-Capillary: 353 mg/dL — ABNORMAL HIGH (ref 70–99)
Glucose-Capillary: 214 mg/dL — ABNORMAL HIGH (ref 70–99)

## 2024-09-16 MED ORDER — INSULIN ASPART 100 UNIT/ML FLEXPEN
2.0000 [IU] | PEN_INJECTOR | Freq: Three times a day (TID) | SUBCUTANEOUS | 0 refills | Status: AC
  Filled 2024-09-16: qty 3, 50d supply, fill #0

## 2024-09-16 MED ORDER — DIAZEPAM 10 MG PO TABS
10.0000 mg | ORAL_TABLET | Freq: Two times a day (BID) | ORAL | 0 refills | Status: AC | PRN
  Filled 2024-09-16 (×2): qty 14, 7d supply, fill #0

## 2024-09-16 MED ORDER — OXYCODONE-ACETAMINOPHEN 10-325 MG PO TABS
1.0000 | ORAL_TABLET | Freq: Four times a day (QID) | ORAL | 0 refills | Status: AC | PRN
  Filled 2024-09-16: qty 28, 7d supply, fill #0

## 2024-09-16 MED ORDER — CEPHALEXIN 500 MG PO CAPS: 500.0000 mg | ORAL_CAPSULE | Freq: Two times a day (BID) | ORAL | Status: DC

## 2024-09-16 MED ORDER — ADULT MULTIVITAMIN W/MINERALS CH
1.0000 | ORAL_TABLET | Freq: Every day | ORAL | 0 refills | Status: AC
  Filled 2024-09-16: qty 30, 30d supply, fill #0

## 2024-09-16 MED ORDER — HYDROCHLOROTHIAZIDE 12.5 MG PO TABS
12.5000 mg | ORAL_TABLET | Freq: Every day | ORAL | 0 refills | Status: AC
  Filled 2024-09-16: qty 30, 30d supply, fill #0

## 2024-09-16 MED ORDER — INSUPEN PEN NEEDLES 32G X 4 MM MISC
0 refills | Status: AC
  Filled 2024-09-16: qty 100, 25d supply, fill #0

## 2024-09-16 MED ORDER — INSULIN GLARGINE 100 UNIT/ML SOLOSTAR PEN
20.0000 [IU] | PEN_INJECTOR | Freq: Every day | SUBCUTANEOUS | 0 refills | Status: AC
  Filled 2024-09-16: qty 6, 30d supply, fill #0

## 2024-09-16 MED ORDER — GLUCERNA SHAKE PO LIQD
237.0000 mL | Freq: Three times a day (TID) | ORAL | 0 refills | Status: AC
  Filled 2024-09-16: qty 21330, 30d supply, fill #0

## 2024-09-16 MED ORDER — GABAPENTIN 300 MG PO CAPS
600.0000 mg | ORAL_CAPSULE | Freq: Two times a day (BID) | ORAL | 0 refills | Status: AC
  Filled 2024-09-16: qty 120, 30d supply, fill #0

## 2024-09-16 MED ORDER — ONDANSETRON 4 MG PO TBDP
4.0000 mg | ORAL_TABLET | Freq: Four times a day (QID) | ORAL | 0 refills | Status: AC | PRN
  Filled 2024-09-16: qty 20, 5d supply, fill #0

## 2024-09-16 MED ORDER — COLLAGENASE 250 UNIT/GM EX OINT
TOPICAL_OINTMENT | Freq: Every day | CUTANEOUS | 0 refills | Status: AC
  Filled 2024-09-16 (×2): qty 30, 30d supply, fill #0

## 2024-09-16 MED ORDER — LOSARTAN POTASSIUM 100 MG PO TABS
100.0000 mg | ORAL_TABLET | Freq: Every day | ORAL | 0 refills | Status: AC
  Filled 2024-09-16: qty 30, 30d supply, fill #0

## 2024-09-16 NOTE — Progress Notes (Signed)
°   09/16/24 1000  Psych Admission Type (Psych Patients Only)  Admission Status Involuntary  Psychosocial Assessment  Patient Complaints Anxiety  Eye Contact Fair  Facial Expression Anxious;Worried (patient fixated on)  Affect Preoccupied  Speech Logical/coherent  Interaction Assertive  Motor Activity Slow  Appearance/Hygiene Layered clothes  Behavior Characteristics Cooperative  Mood Anxious;Preoccupied;Pleasant  Aggressive Behavior  Effect No apparent injury  Thought Process  Coherency WDL  Content WDL  Delusions None reported or observed  Perception WDL  Hallucination None reported or observed  Judgment WDL  Confusion None  Danger to Self  Current suicidal ideation? Denies  Self-Injurious Behavior No self-injurious ideation or behavior indicators observed or expressed   Agreement Not to Harm Self Yes  Description of Agreement Verbal  Danger to Others  Danger to Others None reported or observed

## 2024-09-16 NOTE — Progress Notes (Signed)
 This clinical research associate was notified by Camellia, MHT that patient's CBG was 454 @ 0739. MD was notifed, 17 units of Novolog  was given, along with 20 units of Lantus , per orders. Patient will continue to be monitored.

## 2024-09-16 NOTE — Consult Note (Signed)
 WOC Nurse Consult Note: Consult requested for bilat foot wounds. This was already performed on 12/8; please refer to previous progress notes for assessment, and topical treatment orders have been provided for bedside nurses to perform.  ABIs have been performed and were normal and X-ray did not indicate an abscess.   Please re-consult if further assistance is needed.  Thank-you,  Stephane Fought MSN, RN, CWOCN, CWCN-AP, CNS Contact Mon-Fri 0700-1500: [phone]

## 2024-09-16 NOTE — Progress Notes (Signed)
 Patient requested to have his blood sugar checked again, after receiving morning Insulin . Patient's CBG was 353. MD was notified of the update.

## 2024-09-16 NOTE — Plan of Care (Signed)
  Problem: Education: Goal: Knowledge of [facility] Education information/materials will improve Outcome: Adequate for Discharge Goal: Emotional status will improve Outcome: Adequate for Discharge Goal: Mental status will improve Outcome: Adequate for Discharge Goal: Verbalization of understanding the information provided will improve Outcome: Adequate for Discharge   Problem: Activity: Goal: Interest or engagement in activities will improve Outcome: Adequate for Discharge Goal: Sleeping patterns will improve Outcome: Adequate for Discharge   Problem: Coping: Goal: Ability to verbalize frustrations and anger appropriately will improve Outcome: Adequate for Discharge Goal: Ability to demonstrate self-control will improve Outcome: Adequate for Discharge   Problem: Health Behavior/Discharge Planning: Goal: Identification of resources available to assist in meeting health care needs will improve Outcome: Adequate for Discharge Goal: Compliance with treatment plan for underlying cause of condition will improve Outcome: Adequate for Discharge   Problem: Physical Regulation: Goal: Ability to maintain clinical measurements within normal limits will improve Outcome: Adequate for Discharge   Problem: Safety: Goal: Periods of time without injury will increase Outcome: Adequate for Discharge   Problem: Education: Goal: Ability to describe self-care measures that may prevent or decrease complications (Diabetes Survival Skills Education) will improve Outcome: Adequate for Discharge Goal: Individualized Educational Video(s) Outcome: Adequate for Discharge   Problem: Coping: Goal: Ability to adjust to condition or change in health will improve Outcome: Adequate for Discharge   Problem: Fluid Volume: Goal: Ability to maintain a balanced intake and output will improve Outcome: Adequate for Discharge   Problem: Health Behavior/Discharge Planning: Goal: Ability to identify and  utilize available resources and services will improve Outcome: Adequate for Discharge Goal: Ability to manage health-related needs will improve Outcome: Adequate for Discharge   Problem: Metabolic: Goal: Ability to maintain appropriate glucose levels will improve Outcome: Adequate for Discharge   Problem: Nutritional: Goal: Maintenance of adequate nutrition will improve Outcome: Adequate for Discharge Goal: Progress toward achieving an optimal weight will improve Outcome: Adequate for Discharge   Problem: Skin Integrity: Goal: Risk for impaired skin integrity will decrease Outcome: Adequate for Discharge   Problem: Tissue Perfusion: Goal: Adequacy of tissue perfusion will improve Outcome: Adequate for Discharge

## 2024-09-16 NOTE — BHH Suicide Risk Assessment (Signed)
 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)   Total Time spent with patient: 30 minutes  Musculoskeletal: Strength & Muscle Tone: within normal limits Gait & Station: normal Patient leans: N/A  Psychiatric Specialty Exam  Presentation  General Appearance:  Appropriate for Environment  Eye Contact: Fair  Speech: Clear and Coherent  Speech Volume: Normal  Handedness: Right   Mood and Affect  Mood: Euthymic  Duration of Depression Symptoms: No data recorded Affect: Appropriate   Thought Process  Thought Processes: Coherent  Descriptions of Associations:Intact  Orientation:Full (Time, Place and Person)  Thought Content:Logical  History of Schizophrenia/Schizoaffective disorder:No data recorded Duration of Psychotic Symptoms:No data recorded Hallucinations:Hallucinations: None  Ideas of Reference:None  Suicidal Thoughts:Suicidal Thoughts: No  Homicidal Thoughts:Homicidal Thoughts: No   Sensorium  Memory: Recent Fair  Judgment: Fair  Insight: Fair   Chartered Certified Accountant: Fair  Attention Span: Fair  Recall: [employer] of Knowledge: Fair  Language: Fair   Psychomotor Activity  Psychomotor Activity: Psychomotor Activity: Normal   Assets  Assets: Communication Skills; Desire for Improvement   Sleep  Sleep: Sleep: Fair  Estimated Sleeping Duration (Last 24 Hours): 4.00-5.25 hours  Physical Exam: Physical Exam ROS Blood pressure (!) 130/97, pulse (!) 114, temperature 97.8 F (36.6 C), temperature source Oral, resp. rate 20, SpO2 100%. There is no height or weight on file to calculate BMI.  Mental Status Per Nursing Assessment::   On Admission:  NA  Demographic Factors:   Caucasian  Loss Factors: Decrease in vocational status  Historical Factors: Impulsivity  Risk Reduction Factors:   Living with another person, especially a relative, Positive social support, Positive therapeutic relationship, and Positive coping skills or problem solving skills  Continued Clinical Symptoms:  Depression:   Impulsivity  Cognitive Features That Contribute To Risk:  None    Suicide Risk:  Minimal: No identifiable suicidal ideation.  Patients presenting with no risk factors but with morbid ruminations; may be classified as minimal risk based on the severity of the depressive symptoms   Follow-up Information     [facility] Follow up.   Why: Your appointment is scheduled for 09/23/24 at 10:30am. It will be a virtual appointment and they will contact you at [phone]. Contact information: [address]  [address] [place] [address] [phone]                 Plan Of Care/Follow-up recommendations:  Activity:  as tolerated  Allyn Foil, MD 09/16/2024, 10:08 AM

## 2024-09-16 NOTE — Hospital Course (Addendum)
 Mr. Andrew Buchanan is a 53 year old male with history of anxiety, alcohol use disorder, hypertension, history of hyperosmolar hyperglycemic state, foot ulcers, medication nonadherence.  09/13/2024: He presents for chief concerns of bipolar disorder, noncompliance with medication, abuse of pain medication, with alcohol, suicidal ideation.  Patient was involuntarily committed.  09/14/2024: Patient admitted to behavioral health service for IVC for safety, stabilization and treatment.  09/14/2024: [facility] hospitalist was consulted for abnormal vitals, hypertension.  It appears Home losartan  100 mg daily was not resumed.  Home losartan  was resumed at that time.  12/9: [facility] hospitalist followed again for hypertension and wounds of the foot.  Sed rate and CRP was ordered by that provider at that time.  12/10: I saw patient at bedside. He is awake and alert and pending discharge from [facility] today. He does not appear to be in acute distress. CBC checked today was negative for WBC elevation. Sed rate ordered by [facility] provider yesterday was mildly elevated. Note, patient has a podiatries he sees outpatient and has an appointment with him/hr next week on 12/15 or 12/16.   Keflex 500 mg PO BID, 7 day course ordered for mild cellulitis.  Patient to follow-up with podiatrist outpatient on the 15th or 16th per appointment.

## 2024-09-16 NOTE — Inpatient Diabetes Management (Signed)
 Inpatient Diabetes Program Recommendations  AACE/ADA: New Consensus Statement on Inpatient Glycemic Control   Target Ranges:  Prepandial:   less than 140 mg/dL      Peak postprandial:   less than 180 mg/dL (1-2 hours)      Critically ill patients:  140 - 180 mg/dL    Latest Reference Range & Units 09/15/24 07:58 09/15/24 09:03 09/15/24 11:40 09/15/24 18:10  Glucose-Capillary 70 - 99 mg/dL 691 (H)   Novolog  11 units @9 :03  Novolog  4 units @9 :14  Lantus  15 units @9 :05 72   Novolog  5 units    Latest Reference Range & Units 09/14/24 02:02 09/14/24 07:44 09/14/24 12:14 09/14/24 16:20 09/14/24 19:53  Glucose-Capillary 70 - 99 mg/dL 634 (H) 708 (H) 868 (H) 167 (H) 150 (H)   Review of Glycemic Control  Diabetes history: DM2 Outpatient Diabetes medications: Humulin  70/30 18 units BID, Actos  15 mg daily, Dexcom G7 Current orders for Inpatient glycemic control: Lantus  20 units daily, Novolog  0-15 units TID with meals, Novolog  2 units TID with meals  Inpatient Diabetes Program Recommendations:    Insulin : CBG 308 at 7:58 am and down to 72 mg/dl at 88:59 after receiving a total of Novolog  15 units for meal coverage and correction. Noted meal coverage insulin  dose was decreased.    Thanks, Earnie Gainer, RN, MSN, CDCES Diabetes Coordinator Inpatient Diabetes Program [phone] (Team Pager from 8am to 5pm)

## 2024-09-16 NOTE — Progress Notes (Signed)
 Patient ID: Andrew Buchanan, male   DOB: October 16, 1970, 53 y.o.   MRN: [id number]  Discharge Note:  Patient denies SI/HI/AVH at this time. Discharge instructions, AVS, medication supply, and transition record gone over with patient. Patient given a copy of his Suicide Safety Plan. Patient agrees to comply with medication management, follow-up visit, and outpatient therapy. Patient belongings returned to patient. Patient questions and concerns addressed and answered. Patient ambulatory off unit. Patient discharged to home with his neighbor.

## 2024-09-16 NOTE — Group Note (Signed)
 Date:  09/16/2024 Time:  11:02 AM  Group Topic/Focus:  Building Self Esteem:   The Focus of this group is helping patients become aware of the effects of self-esteem on their lives, the things they and others do that enhance or undermine their self-esteem, seeing the relationship between their level of self-esteem and the choices they make and learning ways to enhance self-esteem.    Participation Level:  Did Not Attend    Andrew Buchanan [name] 09/16/2024, 11:02 AM

## 2024-09-16 NOTE — Progress Notes (Signed)
 PROGRESS NOTE  Andrew Buchanan  FMW:[id number] DOB: 11-23-1970 DOA: 09/14/2024 PCP: Missy Mallie MATSU, MD   Mr. Andrew Buchanan is a 53 year old male with history of anxiety, alcohol use disorder, hypertension, history of hyperosmolar hyperglycemic state, foot ulcers, medication nonadherence.  09/13/2024: He presents for chief concerns of bipolar disorder, noncompliance with medication, abuse of pain medication, with alcohol, suicidal ideation.  Patient was involuntarily committed.  09/14/2024: Patient admitted to behavioral health service for IVC for safety, stabilization and treatment.  09/14/2024: [facility] hospitalist was consulted for abnormal vitals, hypertension.  It appears Home losartan  100 mg daily was not resumed.  Home losartan  was resumed at that time.  12/9: [facility] hospitalist followed again for hypertension and wounds of the foot.  Sed rate and CRP was ordered by that provider at that time.  12/10: I saw patient at bedside. He is awake and alert and pending discharge from [facility] today. He does not appear to be in acute distress. CBC checked today was negative for WBC elevation. Sed rate ordered by [facility] provider yesterday was mildly elevated. Note, patient has a podiatries he sees outpatient and has an appointment with him/hr next week on 12/15 or 12/16.   Keflex 500 mg PO BID, 7 day course ordered for mild cellulitis.  Patient to follow-up with podiatrist outpatient on the 15th or 16th per appointment.  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  Wound care was consulted.  Keflex 500 mg p.o. twice daily to complete a 7-day course is recommended.    Discussed with primary team.  Level of care:  Behavioral health  At bedside, patient was awake alert and oriented x 3.  He does not appear to be in acute distress.  Patient ready to go home.  Patient states that he has a podiatrist follow-up appointment next week, 12/15 or 12/16, he needs to check it when he gets home.  Objective: Vitals:   09/15/24 0616 09/15/24 1233 09/15/24 1704 09/16/24 0607  BP: (!) 134/99 (!) 133/91 (!) 146/107 (!) 130/97  Pulse: (!) 109 (!) 114 84 (!) 114  Resp: 18   20  Temp:    97.8 F (36.6 C)  TempSrc:    Oral  SpO2: 99%  94% 100%   No intake or output data in the 24 hours ending 09/16/24 1536 There were no vitals filed for this visit.  Examination:  General exam: Appears calm and comfortable  Respiratory system: Clear to auscultation. Respiratory effort normal. Cardiovascular system: S1 & S2 heard, RRR. No JVD, murmurs, rubs, gallops or clicks. No pedal edema. Gastrointestinal system: Abdomen is nondistended, soft and nontender. No organomegaly or masses felt. Normal bowel sounds heard. Central nervous system: Alert and oriented. No focal neurological deficits. Extremities: Symmetric 5 x 5 power.  Patient ambulating 100 feet without difficulty. Skin:           Psychiatry: Judgement and insight appear normal. Mood & affect appropriate.   Data Reviewed: I have personally reviewed following labs and imaging studies  CBC: Recent Labs  Lab 09/13/24 1446 09/16/24 0822  WBC 8.2 6.6  HGB 13.8 9.9*  HCT 40.3 28.4*  MCV 103.1* 102.9*  PLT 114* 78*   Basic Metabolic Panel: Recent Labs  Lab  09/13/24 1446 09/16/24 0615  NA 142 134*  K 4.1 4.7  CL 102 98  CO2 22 25  GLUCOSE 332* 250*  BUN 11 18  CREATININE 1.15 1.15  CALCIUM  8.7* 9.1   GFR: Estimated Creatinine Clearance: 74.1 mL/min (by C-G formula based on SCr of 1.15 mg/dL).  Liver Function Tests: Recent Labs  Lab 09/13/24 1446  AST 186*  ALT 60*  ALKPHOS 166*  BILITOT 0.5  PROT 8.8*  ALBUMIN 4.2   CBG: Recent Labs   Lab 09/15/24 1651 09/15/24 1939 09/16/24 0739 09/16/24 1003 09/16/24 1121  GLUCAP 168* 130* 454* 353* 214*   Recent Results (from the past 240 hours)  Culture, blood (Routine X 2) w Reflex to ID Panel     Status: None (Preliminary result)   Collection Time: 09/15/24  9:49 PM   Specimen: BLOOD  Result Value Ref Range Status   Specimen Description BLOOD LEFT ANTECUBITAL  Final   Special Requests   Final    BOTTLES DRAWN AEROBIC AND ANAEROBIC Blood Culture adequate volume   Culture   Final    NO GROWTH < 12 HOURS Performed at [facility], [address], [place], [place] [address]    Report Status PENDING  Incomplete  Culture, blood (Routine X 2) w Reflex to ID Panel     Status: None (Preliminary result)   Collection Time: 09/15/24  9:49 PM   Specimen: BLOOD  Result Value Ref Range Status   Specimen Description BLOOD RIGHT ANTECUBITAL  Final   Special Requests   Final    BOTTLES DRAWN AEROBIC AND ANAEROBIC Blood Culture adequate volume   Culture   Final    NO GROWTH < 12 HOURS Performed at [facility], [address] [place], [place] [address]    Report Status PENDING  Incomplete    Radiology Studies: US  ARTERIAL ABI (SCREENING LOWER EXTREMITY) Result Date: 09/16/2024 EXAM: VASCULAR SCREENING 09/15/2024 05:33:53 PM CLINICAL HISTORY: Foot ulcer (HCC) 813122. TECHNIQUE: ABIs were  acquired. COMPARISON: None available. FINDINGS: ANKLE BRACHIAL INDEX: Right: 1.28 Left: 1.13 ABIs were within normal range. Multiphasic waveforms are recorded in the distal calf bilaterally. IMPRESSION: 1. Normal resting ABIs bilaterally with multiphasic distal waveforms, indicating no hemodynamically significant peripheral arterial disease. Electronically signed by: Katheleen Faes MD 09/16/2024 07:33 AM EST RP Workstation: [id number]   DG Foot Complete Right Result Date: 09/14/2024 CLINICAL DATA:  Foot ulcer EXAM: RIGHT FOOT COMPLETE - 3+ VIEW COMPARISON:  07/15/2024 FINDINGS:  Stable fixating screw in the fifth metatarsal. Old fourth metatarsal fracture deformity. Assessment of the first fourth and fifth distal digits is slightly limited by overlying bandage material. No obvious fracture, malalignment or osseous destructive change. Bandage material plantar surface of the midfoot. Tiny plantar calcaneal spur. No soft tissue emphysema IMPRESSION: 1. No acute osseous abnormality. No soft tissue emphysema. 2. Stable fixating screw in the fifth metatarsal. Old fourth metatarsal fracture deformity. Electronically Signed   By: Luke Bun M.D.   On: 09/14/2024 23:40   Scheduled Meds:  cephALEXin  500 mg Oral Q12H   collagenase    Topical Daily   feeding supplement (GLUCERNA SHAKE)  237 mL Oral TID BM   gabapentin   600 mg Oral BID   hydrochlorothiazide   12.5 mg Oral Daily   influenza vac split trivalent PF  0.5 mL Intramuscular Tomorrow-1000   insulin  aspart  0-15 Units Subcutaneous TID WC   insulin  aspart  2 Units Subcutaneous TID WC   insulin  glargine  20 Units Subcutaneous Daily  LORazepam   1 mg Oral BID   Followed by   NOREEN ON 09/18/2024] LORazepam   1 mg Oral Daily   losartan   100 mg Oral Daily   multivitamin with minerals  1 tablet Oral Daily   thiamine   100 mg Intramuscular Once   thiamine   100 mg Oral Daily    LOS: 2 days   Time spent: 45 minutes  Dr. Sherre [facility] If 7PM-7AM, please contact night-coverage 09/16/2024, 3:36 PM

## 2024-09-16 NOTE — [facility]HH Counselor (Signed)
 CSW met with pt regarding discharge. Pt voiced anxiety around discharge process as he had not seen provider or signed any paperwork. He shared that his blood sugar was high and that his ride would need to be called to tell them to come later. CSW agreed to attempt contact with ride, Delon Satchel [phone]). No other concerns expressed. Contact ended without incident.   CSW attempted contact with Delon Satchel. Contact was unable to be established but HIPAA compliant voicemail left with contact information for follow through.   Psych provider and CSW met with pt to notify him that we would need to speak to family prior to discharge as he was committed by them. He agreed that team could speak with his sister, Zoe. Pt and treatment team called Zoe, [phone]. Zoe confirmed that she was the one who had pt committed. She stated that he sounds a lot better and denied any concerns around discharge. No other concerns expressed. Contact ended without incident.   CSW received call from [facility]. CSW confirmed that pt would still be ready to go at 12 PM as his medication supply would be here shortly. No other concerns expressed. Contact ended without incident.   Nadara SAUNDERS. Chaim, MSW, LCSW, LCAS 09/16/2024 11:04 AM

## 2024-09-16 NOTE — Progress Notes (Signed)
°  [facility] Adult Case Management Discharge Plan :  Will you be returning to the same living situation after discharge:  Yes,  pt plans to return home.  At discharge, do you have transportation home?: Yes,  pt support system to provide transportation.  Do you have the ability to pay for your medications: Yes,  BLUE CROSS BLUE SHIELD / [employer]BS[employer] NON-PARTICIPATING OON  Release of information consent forms completed and in the chart;  Patient's signature needed at discharge.  Patient to Follow up at:  Follow-up Information     [facility] Follow up.   Why: Your appointment is scheduled for 09/23/24 at 10:30am. It will be a virtual appointment and they will contact you at [phone]. Contact information: [address]  [address] [place] [address] [phone]                 Next level of care provider has access to [facility] Link:no  Safety Planning and Suicide Prevention discussed: Yes,  SPE completed with pt and sister.      Has patient been referred to the Quitline?: Patient refused referral for treatment  Patient has been referred for addiction treatment: Patient refused referral for treatment.  Nadara JONELLE Fam, LCSW 09/16/2024, 11:05 AM

## 2024-09-16 NOTE — Telephone Encounter (Signed)
 Pharmacy Patient Advocate Encounter   Received notification from Inpatient Request that prior authorization for Santyl  30gm ointment is required/requested.   Insurance verification completed.   The patient is insured through [facility].   Per test claim: PA required; PA submitted to above mentioned insurance via Latent Key/confirmation #/EOC [id number] Status is pending

## 2024-09-16 NOTE — Telephone Encounter (Signed)
 Pharmacy Patient Advocate Encounter  Received notification from [facility] that Prior Authorization for Santyl  250UNIT/GM ointment has been APPROVED from 09/16/24 to 09/16/27   PA #/Case ID/Reference #: [id number]

## 2024-09-16 NOTE — Discharge Summary (Signed)
 Physician Discharge Summary Note  Patient:  Andrew Buchanan is an 53 y.o., male MRN:  [id number] DOB:  [birth year] Patient phone:  [phone] (home)  Patient address:   [address] [place] [address],   Total time spent: 40 min Date of Admission:  09/14/2024 Date of Discharge: 09/16/24  Reason for Admission:  Patient was admitted IVC from the ED for suicidal ideation with plan to shot himself in the context of alcohol use/intoxication . Patient upon arrival is receptive to staff, he was cooperative with admission process. Patient's thoughts are organized and coherent, affect is congruent with mood. Patient currently denies SI/HI/AVH.   Principal Problem: Substance induced mood disorder-depressed Discharge Diagnoses: Substance induced mood disorder-depressed Alcohol use disorder/intoxication   Past Psychiatric History: see h&p  Family Psychiatric  History: see h&p Social History:  Social History   Substance and Sexual Activity  Alcohol Use Yes   Comment: 1 bottle (a pint) of vodka per day.     Social History   Substance and Sexual Activity  Drug Use Yes   Types: Crack cocaine, Benzodiazepines, Cocaine    Social History   Socioeconomic History   Marital status: Single    Spouse name: Not on file   Number of children: Not on file   Years of education: Not on file   Highest education level: Not on file  Occupational History   Not on file  Tobacco Use   Smoking status: Some Days    Types: Cigars   Smokeless tobacco: Never  Substance and Sexual Activity   Alcohol use: Yes    Comment: 1 bottle (a pint) of vodka per day.   Drug use: Yes    Types: Crack cocaine, Benzodiazepines, Cocaine   Sexual activity: Not on file  Other Topics Concern   Not on file  Social History Narrative   Not on file   Social Drivers of Health   Financial Resource Strain: Medium Risk (11/15/2022)   Received from [facility]   Overall Financial Resource Strain  (CARDIA)    Difficulty of Paying Living Expenses: Somewhat hard  Food Insecurity: No Food Insecurity (09/14/2024)   Hunger Vital Sign    Worried About Running Out of Food in the Last Year: Never true    Ran Out of Food in the Last Year: Never true  Transportation Needs: No Transportation Needs (09/14/2024)   PRAPARE - Administrator, Civil Service (Medical): No    Lack of Transportation (Non-Medical): No  Physical Activity: Inactive (11/15/2022)   Received from [facility]   Exercise Vital Sign    On average, how many days per week do you engage in moderate to strenuous exercise (like a brisk walk)?: 0 days    On average, how many minutes do you engage in exercise at this level?: 0 min  Stress: Stress Concern Present (11/15/2022)   Received from [facility] - Occupational Stress Questionnaire    Feeling of Stress : Rather much  Social Connections: Unknown (09/14/2024)   Social Connection and Isolation Panel    Frequency of Communication with Friends and Family: Twice a week    Frequency of Social Gatherings with Friends and Family: Twice a week    Attends Religious Services: 1 to 4 times per year    Active Member of [employer] or Organizations: No    Attends Banker Meetings: Never    Marital Status: Patient declined   Past  Medical History:  Past Medical History:  Diagnosis Date   AKI (acute kidney injury) 01/08/2021   Alcohol abuse    Anxiety    Cellulitis and abscess of neck 06/10/2024   Diabetes mellitus without complication (HCC)    Diabetic foot infection (HCC) 11/20/2023   DKA (diabetic ketoacidosis) (HCC) 05/19/2020   DKA, type 2 (HCC) 06/09/2024   History of MRSA infection 12/08/2023   11/2023 Diabetic foot wound requiring surgical debridement, cultures MRSA+     Hyperosmolar hyperglycemic state (HHS) (HCC) 01/05/2021   Hypertension    Pancreatitis    Sepsis with acute renal failure without septic shock  (HCC)    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 Patient upon arrival is receptive to staff, he was cooperative with admission process. Patient's thoughts are organized and coherent, affect is congruent with mood. Patient currently denies SI/HI/AVH. Patient is admitted to adult psych unit with Q15 min safety monitoring. Multidisciplinary team approach is offered. Medication management; group/milieu therapy is offered.   On admission,Patient was on CIWA protocol . Patient declined any psychotropic medications or adjustment to his benzodiazepines.  Patient was noted to be on narcotic pain medications and high doses of Valium .  He was very persistent about staying on Valium  in addition to the Ativan  taper for alcohol withdrawal.  Patient was in precontemplative stage and was not interested in any substance use rehab.  He maintains a behaviors.  He consistently denied SI/HI/plan.  He reports his anxiety has been much improved.  Patient was seen by the wound consult and hospitalist for his wound on the foot.  Discharge planning and treatment plan was discussed with patient and his sister.  His sister who initiated IVC had no safety concerns and agreed with the discharge of the patient back home with outpatient resources.  Provider also discussed the treatment plan and discharge planning with Dr. Missy patient's outpatient PCP.  Detailed risk assessment is complete based on clinical exam and individual risk factors and acute suicide risk is low and acute violence risk is low.    On the day of discharge, patient denies SI/HI/plan and denies hallucinations.   Patient remains future oriented and is willing to participate in outpatient mental health services.  Currently, all modifiable risk of harm to self/harm to others have been addressed and patient is no longer appropriate for the acute inpatient setting and is able to continue treatment for mental health needs in the community with the supports as indicated below.  Patient is educated and verbalized understanding of discharge plan of care including medications, follow-up appointments, mental health resources and further crisis services in the community.  He is instructed to call 911 or present to the nearest emergency room should he experience any decompensation in mood, disturbance of bowel or return of suicidal/homicidal ideations.  Patient verbalizes understanding of this education and agrees to this plan of care  Physical Findings: AIMS:  , ,  ,  ,    CIWA:  CIWA-Ar Total: 11 COWS:      Psychiatric Specialty Exam:  Presentation  General Appearance:  Appropriate for Environment  Eye Contact: Fair  Speech: Clear and Coherent  Speech Volume: Normal    Mood and Affect  Mood: Euthymic  Affect: Appropriate   Thought Process  Thought Processes: Coherent  Descriptions of Associations:Intact  Orientation:Full (Time, Place and Person)  Thought Content:Logical  Hallucinations:Hallucinations: None  Ideas of Reference:None  Suicidal Thoughts:Suicidal Thoughts: No  Homicidal Thoughts:Homicidal Thoughts: No   Sensorium  Memory: Recent Fair  Judgment: Fair  Insight: Fair   Chartered Certified Accountant: Fair  Attention Span: Fair  Recall: [employer] of Knowledge: Fair  Language: Fair   Psychomotor Activity  Psychomotor Activity: Psychomotor Activity: Normal  Musculoskeletal: Strength & Muscle Tone: within normal limits Gait & Station: normal Assets  Assets: Manufacturing Systems Engineer; Desire for Improvement   Sleep  Sleep: Sleep:  Fair    Physical Exam: Physical Exam ROS Blood pressure (!) 130/97, pulse (!) 114, temperature 97.8 F (36.6 C), temperature source Oral, resp. rate 20, SpO2 100%. There is no height or weight on file to calculate BMI.   Social History   Tobacco Use  Smoking Status Some Days   Types: Cigars  Smokeless Tobacco Never   Tobacco Cessation:  N/A, patient does not currently use tobacco products   Blood Alcohol level:  Lab Results  Component Value Date   ETH 308 (HH) 09/13/2024   ETH 326 (HH) 07/15/2024    Metabolic Disorder Labs:  Lab Results  Component Value Date   HGBA1C 9.5 (H) 06/10/2024   MPG 226 06/10/2024   MPG 148.46 08/25/2021   No results found for: PROLACTIN No results found for: CHOL, TRIG, HDL, CHOLHDL, VLDL, LDLCALC  See Psychiatric Specialty Exam and Suicide Risk Assessment completed by Attending Physician prior to discharge.  Discharge destination:  Home  Is patient on multiple antipsychotic therapies at discharge:  No   Has Patient had three or more failed trials of antipsychotic monotherapy by history:  No  Recommended Plan for Multiple Antipsychotic Therapies: NA  Discharge Instructions     Diet - low sodium heart healthy   Complete by: As directed    Discharge wound care:   Complete by: As directed    Wound care  Daily      Comments: 1. Apply Santyl  to left plantar foot, left 3rd toe and right great toe wounds Q day, then cover with foam dressings. Soila # [phone]) 2. Foam dressing to right plantar foot, change Q 3 days or PRN when soiled.  3.  It is OK for patient to shower without the dressings.   Increase activity slowly   Complete by: As directed       Allergies as of 09/16/2024       Reactions   Amlodipine Shortness Of Breath   Antihistamines, Chlorpheniramine-type Anaphylaxis   Statins Other (See Comments)   Pt declines        Medication List     STOP taking these medications    clonazePAM  1 MG  tablet Commonly known as: KLONOPIN    folic acid  1 MG tablet Commonly known as: FOLVITE    HumuLIN  70/30 (70-30) 100 UNIT/ML injection Generic drug: insulin  NPH-regular Human   insulin  lispro 100 UNIT/ML injection Commonly known as: HUMALOG   ondansetron  4 MG tablet Commonly known as: ZOFRAN    sulfamethoxazole -trimethoprim  800-160 MG tablet Commonly known as: Bactrim  DS   thiamine  100 MG tablet Commonly known as: Vitamin B-1       TAKE these medications      Indication  blood glucose meter kit and supplies  Kit Dispense based on patient and insurance preference. Use up to four times daily as directed.  Indication: Diabetes   collagenase  250 UNIT/GM ointment Commonly known as: SANTYL  Apply topically daily.  Indication: Skin Ulcer   Dexcom G7 Sensor Misc Apply to skin as directed. Change every 10 days.  Indication: Diabetes   diazepam  10 MG tablet Commonly known as: VALIUM  Take 1 tablet (10 mg total) by mouth every 12 (twelve) hours as needed for up to 7 days for anxiety.  Take 2 tablets (20mg ) in the morning and 1 and 1/2 tablets (15mg ) in the evening. MAX 3 and 1/2 PILLS PER DAY. This med is instead of your clonazepam . This bottle should last 14 days. What changed:  how much to take how to take this when to take this reasons to take this  Indication: Feeling Anxious   feeding supplement (GLUCERNA SHAKE) Liqd Take 237 mLs by mouth 3 (three) times daily between meals.  Indication: Diabetes   gabapentin  300 MG capsule Commonly known as: NEURONTIN  Take 2 capsules (600 mg total) by mouth 2 (two) times daily.  Indication: Panic Disorder   hydrochlorothiazide  12.5 MG tablet Commonly known as: HYDRODIURIL  Take 1 tablet (12.5 mg total) by mouth daily.  Indication: Heart Failure   insulin  aspart 100 UNIT/ML injection Commonly known as: novoLOG  Inject 2 Units into the skin 3 (three) times daily with meals.  Indication: Type 1 Diabetes   insulin  glargine 100  UNIT/ML injection Commonly known as: LANTUS  Inject 0.2 mLs (20 Units total) into the skin daily. Start taking on: September 17, 2024  Indication: High Blood Sugar   Insulin  Pen Needle 31G X 5 MM Misc 1 each by Does not apply route 4 (four) times daily.  Indication: Diabetes   losartan  100 MG tablet Commonly known as: COZAAR  Take 1 tablet (100 mg total) by mouth daily.  Indication: High Blood Pressure   multivitamin with minerals Tabs tablet Take 1 tablet by mouth daily. Start taking on: September 17, 2024  Indication: Diabetes   ondansetron  4 MG disintegrating tablet Commonly known as: ZOFRAN -ODT Take 1 tablet (4 mg total) by mouth every 6 (six) hours as needed for nausea or vomiting.  Indication: Nausea and Vomiting   oxyCODONE -acetaminophen  10-325 MG tablet Commonly known as: PERCOCET Take 1 tablet by mouth every 6 (six) hours as needed for up to 7 days (MAX 4 PILLS PER DAY). MAX 4 PILLS PER DAY  Indication: Pain        Follow-up Information     [facility] Follow up.   Why: Your appointment is scheduled for 09/23/24 at 10:30am. It will be a virtual appointment and they will contact you at [phone]. Contact information: [address]  [address] [place] [address] [phone]                 Follow-up recommendations:  Activity:  As tolerated    Signed: Emonee Winkowski, MD 09/16/2024, 10:09 AM

## 2024-09-18 ENCOUNTER — Other Ambulatory Visit: Payer: Self-pay

## 2024-09-20 LAB — CULTURE, BLOOD (ROUTINE X 2)
Culture: NO GROWTH
Culture: NO GROWTH
Special Requests: ADEQUATE
Special Requests: ADEQUATE

## 2024-09-23 ENCOUNTER — Other Ambulatory Visit: Payer: Self-pay

## 2024-09-25 LAB — MINIMUM INHIBITORY CONC. (1 DRUG)

## 2024-10-09 NOTE — Telephone Encounter (Signed)
 Attempted to call, no answer unable to LM

## 2024-10-11 ENCOUNTER — Observation Stay
Admission: EM | Admit: 2024-10-11 | Discharge: 2024-10-12 | Disposition: A | Attending: Obstetrics and Gynecology | Admitting: Obstetrics and Gynecology

## 2024-10-11 ENCOUNTER — Emergency Department

## 2024-10-11 ENCOUNTER — Other Ambulatory Visit: Payer: Self-pay

## 2024-10-11 DIAGNOSIS — L97509 Non-pressure chronic ulcer of other part of unspecified foot with unspecified severity: Secondary | ICD-10-CM | POA: Insufficient documentation

## 2024-10-11 DIAGNOSIS — L089 Local infection of the skin and subcutaneous tissue, unspecified: Secondary | ICD-10-CM

## 2024-10-11 DIAGNOSIS — Z87828 Personal history of other (healed) physical injury and trauma: Secondary | ICD-10-CM

## 2024-10-11 DIAGNOSIS — Z794 Long term (current) use of insulin: Secondary | ICD-10-CM | POA: Insufficient documentation

## 2024-10-11 DIAGNOSIS — I959 Hypotension, unspecified: Secondary | ICD-10-CM

## 2024-10-11 DIAGNOSIS — F1090 Alcohol use, unspecified, uncomplicated: Secondary | ICD-10-CM | POA: Diagnosis not present

## 2024-10-11 DIAGNOSIS — N179 Acute kidney failure, unspecified: Secondary | ICD-10-CM | POA: Diagnosis present

## 2024-10-11 DIAGNOSIS — Z79899 Other long term (current) drug therapy: Secondary | ICD-10-CM | POA: Insufficient documentation

## 2024-10-11 DIAGNOSIS — I10 Essential (primary) hypertension: Secondary | ICD-10-CM | POA: Diagnosis present

## 2024-10-11 DIAGNOSIS — R680 Hypothermia, not associated with low environmental temperature: Secondary | ICD-10-CM | POA: Insufficient documentation

## 2024-10-11 DIAGNOSIS — T383X1A Poisoning by insulin and oral hypoglycemic [antidiabetic] drugs, accidental (unintentional), initial encounter: Secondary | ICD-10-CM

## 2024-10-11 DIAGNOSIS — R55 Syncope and collapse: Secondary | ICD-10-CM | POA: Diagnosis present

## 2024-10-11 DIAGNOSIS — T40601A Poisoning by unspecified narcotics, accidental (unintentional), initial encounter: Secondary | ICD-10-CM

## 2024-10-11 DIAGNOSIS — F17290 Nicotine dependence, other tobacco product, uncomplicated: Secondary | ICD-10-CM | POA: Diagnosis not present

## 2024-10-11 DIAGNOSIS — Z87898 Personal history of other specified conditions: Secondary | ICD-10-CM

## 2024-10-11 DIAGNOSIS — T402X1A Poisoning by other opioids, accidental (unintentional), initial encounter: Secondary | ICD-10-CM | POA: Insufficient documentation

## 2024-10-11 DIAGNOSIS — T68XXXA Hypothermia, initial encounter: Secondary | ICD-10-CM

## 2024-10-11 DIAGNOSIS — E11649 Type 2 diabetes mellitus with hypoglycemia without coma: Secondary | ICD-10-CM

## 2024-10-11 DIAGNOSIS — L03031 Cellulitis of right toe: Secondary | ICD-10-CM | POA: Diagnosis not present

## 2024-10-11 DIAGNOSIS — E11621 Type 2 diabetes mellitus with foot ulcer: Secondary | ICD-10-CM | POA: Insufficient documentation

## 2024-10-11 DIAGNOSIS — E11628 Type 2 diabetes mellitus with other skin complications: Secondary | ICD-10-CM

## 2024-10-11 DIAGNOSIS — T383X4A Poisoning by insulin and oral hypoglycemic [antidiabetic] drugs, undetermined, initial encounter: Principal | ICD-10-CM | POA: Insufficient documentation

## 2024-10-11 DIAGNOSIS — F1910 Other psychoactive substance abuse, uncomplicated: Secondary | ICD-10-CM | POA: Diagnosis present

## 2024-10-11 DIAGNOSIS — F319 Bipolar disorder, unspecified: Secondary | ICD-10-CM | POA: Diagnosis not present

## 2024-10-11 DIAGNOSIS — R4189 Other symptoms and signs involving cognitive functions and awareness: Secondary | ICD-10-CM

## 2024-10-11 LAB — CBC WITH DIFFERENTIAL/PLATELET
Abs Immature Granulocytes: 0.21 K/uL — ABNORMAL HIGH (ref 0.00–0.07)
Basophils Absolute: 0.1 K/uL (ref 0.0–0.1)
Basophils Relative: 1 %
Eosinophils Absolute: 0.1 K/uL (ref 0.0–0.5)
Eosinophils Relative: 1 %
HCT: 30.9 % — ABNORMAL LOW (ref 39.0–52.0)
Hemoglobin: 10.6 g/dL — ABNORMAL LOW (ref 13.0–17.0)
Immature Granulocytes: 2 %
Lymphocytes Relative: 7 %
Lymphs Abs: 0.8 K/uL (ref 0.7–4.0)
MCH: 35.2 pg — ABNORMAL HIGH (ref 26.0–34.0)
MCHC: 34.3 g/dL (ref 30.0–36.0)
MCV: 102.7 fL — ABNORMAL HIGH (ref 80.0–100.0)
Monocytes Absolute: 0.6 K/uL (ref 0.1–1.0)
Monocytes Relative: 6 %
Neutro Abs: 9.5 K/uL — ABNORMAL HIGH (ref 1.7–7.7)
Neutrophils Relative %: 83 %
Platelets: 75 K/uL — ABNORMAL LOW (ref 150–400)
RBC: 3.01 MIL/uL — ABNORMAL LOW (ref 4.22–5.81)
RDW: 14.5 % (ref 11.5–15.5)
WBC: 11.3 K/uL — ABNORMAL HIGH (ref 4.0–10.5)
nRBC: 0 % (ref 0.0–0.2)

## 2024-10-11 LAB — PROCALCITONIN: Procalcitonin: 0.16 ng/mL

## 2024-10-11 LAB — CBG MONITORING, ED
Glucose-Capillary: 26 mg/dL — CL (ref 70–99)
Glucose-Capillary: 44 mg/dL — CL (ref 70–99)
Glucose-Capillary: 93 mg/dL (ref 70–99)
Glucose-Capillary: 131 mg/dL — ABNORMAL HIGH (ref 70–99)
Glucose-Capillary: 147 mg/dL — ABNORMAL HIGH (ref 70–99)

## 2024-10-11 LAB — URINE DRUG SCREEN
Amphetamines: NEGATIVE
Barbiturates: NEGATIVE
Benzodiazepines: POSITIVE — AB
Cocaine: NEGATIVE
Fentanyl: NEGATIVE
Methadone Scn, Ur: NEGATIVE
Opiates: POSITIVE — AB
Tetrahydrocannabinol: NEGATIVE

## 2024-10-11 LAB — URINALYSIS, ROUTINE W REFLEX MICROSCOPIC
Bilirubin Urine: NEGATIVE
Glucose, UA: 50 mg/dL — AB
Hgb urine dipstick: NEGATIVE
Ketones, ur: NEGATIVE mg/dL
Nitrite: NEGATIVE
Protein, ur: NEGATIVE mg/dL
Specific Gravity, Urine: 1.006 (ref 1.005–1.030)
pH: 6 (ref 5.0–8.0)

## 2024-10-11 LAB — SALICYLATE LEVEL: Salicylate Lvl: <7 mg/dL — ABNORMAL LOW (ref 7.0–30.0)

## 2024-10-11 LAB — COMPREHENSIVE METABOLIC PANEL WITH GFR
ALT: 15 U/L (ref 0–44)
AST: 49 U/L — ABNORMAL HIGH (ref 15–41)
Albumin: 3.9 g/dL (ref 3.5–5.0)
Alkaline Phosphatase: 83 U/L (ref 38–126)
Anion gap: 15 (ref 5–15)
BUN: 19 mg/dL (ref 6–20)
CO2: 24 mmol/L (ref 22–32)
Calcium: 7.5 mg/dL — ABNORMAL LOW (ref 8.9–10.3)
Chloride: 94 mmol/L — ABNORMAL LOW (ref 98–111)
Creatinine, Ser: 1.6 mg/dL — ABNORMAL HIGH (ref 0.61–1.24)
GFR, Estimated: 51 mL/min — ABNORMAL LOW
Glucose, Bld: 45 mg/dL — ABNORMAL LOW (ref 70–99)
Potassium: 4.4 mmol/L (ref 3.5–5.1)
Sodium: 132 mmol/L — ABNORMAL LOW (ref 135–145)
Total Bilirubin: 0.6 mg/dL (ref 0.0–1.2)
Total Protein: 7.8 g/dL (ref 6.5–8.1)

## 2024-10-11 LAB — ACETAMINOPHEN LEVEL: Acetaminophen (Tylenol), Serum: <10 ug/mL — ABNORMAL LOW (ref 10–30)

## 2024-10-11 LAB — LACTIC ACID, PLASMA: Lactic Acid, Venous: 2.1 mmol/L (ref 0.5–1.9)

## 2024-10-11 LAB — MAGNESIUM: Magnesium: 1 mg/dL — ABNORMAL LOW (ref 1.7–2.4)

## 2024-10-11 LAB — ETHANOL: Alcohol, Ethyl (B): <15 mg/dL

## 2024-10-11 MED ORDER — LORAZEPAM 1 MG PO TABS
1.0000 mg | ORAL_TABLET | ORAL | Status: DC | PRN
  Administered 2024-10-12: 2 mg via ORAL
  Filled 2024-10-11: qty 2

## 2024-10-11 MED ORDER — VANCOMYCIN HCL 1500 MG/300ML IV SOLN: 1500.0000 mg | INTRAVENOUS | Status: DC

## 2024-10-11 MED ORDER — ONDANSETRON HCL 4 MG/2ML IJ SOLN
4.0000 mg | Freq: Once | INTRAMUSCULAR | Status: AC
  Administered 2024-10-11: 4 mg via INTRAVENOUS
  Filled 2024-10-11: qty 2

## 2024-10-11 MED ORDER — KETOROLAC TROMETHAMINE 30 MG/ML IJ SOLN
30.0000 mg | Freq: Four times a day (QID) | INTRAMUSCULAR | Status: DC | PRN
  Administered 2024-10-12: 30 mg via INTRAVENOUS
  Filled 2024-10-11: qty 1

## 2024-10-11 MED ORDER — ONDANSETRON HCL 4 MG/2ML IJ SOLN: 4.0000 mg | Freq: Four times a day (QID) | INTRAMUSCULAR | Status: DC | PRN

## 2024-10-11 MED ORDER — ACETAMINOPHEN 650 MG RE SUPP: 650.0000 mg | Freq: Four times a day (QID) | RECTAL | Status: DC | PRN

## 2024-10-11 MED ORDER — GABAPENTIN 300 MG PO CAPS
600.0000 mg | ORAL_CAPSULE | Freq: Two times a day (BID) | ORAL | Status: DC
  Administered 2024-10-12: 600 mg via ORAL
  Filled 2024-10-11: qty 2

## 2024-10-11 MED ORDER — DEXTROSE 50 % IV SOLN
1.0000 | Freq: Once | INTRAVENOUS | Status: AC
  Administered 2024-10-11: 50 mL via INTRAVENOUS

## 2024-10-11 MED ORDER — THIAMINE HCL 100 MG/ML IJ SOLN: 100.0000 mg | Freq: Every day | INTRAMUSCULAR | Status: DC

## 2024-10-11 MED ORDER — FOLIC ACID 1 MG PO TABS: 1.0000 mg | ORAL_TABLET | Freq: Every day | ORAL | Status: DC

## 2024-10-11 MED ORDER — LORAZEPAM 2 MG/ML IJ SOLN: 1.0000 mg | INTRAMUSCULAR | Status: DC | PRN

## 2024-10-11 MED ORDER — PIPERACILLIN-TAZOBACTAM 3.375 G IVPB
3.3750 g | Freq: Three times a day (TID) | INTRAVENOUS | Status: DC
  Administered 2024-10-12: 3.375 g via INTRAVENOUS
  Filled 2024-10-11: qty 50

## 2024-10-11 MED ORDER — THIAMINE MONONITRATE 100 MG PO TABS: 100.0000 mg | ORAL_TABLET | Freq: Every day | ORAL | Status: DC

## 2024-10-11 MED ORDER — ACETAMINOPHEN 325 MG PO TABS: 650.0000 mg | ORAL_TABLET | Freq: Four times a day (QID) | ORAL | Status: DC | PRN

## 2024-10-11 MED ORDER — NALOXONE HCL 2 MG/2ML IJ SOSY: 0.4000 mg | PREFILLED_SYRINGE | INTRAMUSCULAR | Status: DC | PRN

## 2024-10-11 MED ORDER — VANCOMYCIN HCL 1750 MG/350ML IV SOLN
1750.0000 mg | Freq: Once | INTRAVENOUS | Status: AC
  Administered 2024-10-11: 1750 mg via INTRAVENOUS
  Filled 2024-10-11: qty 350

## 2024-10-11 MED ORDER — PIPERACILLIN-TAZOBACTAM 3.375 G IVPB 30 MIN
3.3750 g | Freq: Once | INTRAVENOUS | Status: AC
  Administered 2024-10-11: 3.375 g via INTRAVENOUS
  Filled 2024-10-11: qty 50

## 2024-10-11 MED ORDER — ONDANSETRON HCL 4 MG PO TABS: 4.0000 mg | ORAL_TABLET | Freq: Four times a day (QID) | ORAL | Status: DC | PRN

## 2024-10-11 MED ORDER — DEXTROSE-SODIUM CHLORIDE 5-0.9 % IV SOLN: INTRAVENOUS | Status: DC

## 2024-10-11 MED ORDER — LACTATED RINGERS IV BOLUS
1000.0000 mL | Freq: Once | INTRAVENOUS | Status: AC
  Administered 2024-10-11: 1000 mL via INTRAVENOUS

## 2024-10-11 MED ORDER — ADULT MULTIVITAMIN W/MINERALS CH: 1.0000 | ORAL_TABLET | Freq: Every day | ORAL | Status: DC

## 2024-10-11 NOTE — ED Notes (Signed)
 EDP notified about pt's rectal temp of 94.7. Bear hugger applied to pt.

## 2024-10-11 NOTE — Assessment & Plan Note (Signed)
 Likely secondary to hypotension Creatinine 1.6 up from baseline of 1.15 Expecting improvement with IV fluids

## 2024-10-11 NOTE — Assessment & Plan Note (Signed)
 Continue home meds pending verification

## 2024-10-11 NOTE — Assessment & Plan Note (Signed)
 Will benefit from counseling on complete abstinence

## 2024-10-11 NOTE — ED Notes (Signed)
 Pharmacy will do med req shortly so that provider can order home medications. Pt is requesting them.

## 2024-10-11 NOTE — Hospital Course (Signed)
 Andrew Buchanan

## 2024-10-11 NOTE — ED Notes (Signed)
 Pt given a phone so he could call his mother.

## 2024-10-11 NOTE — ED Notes (Signed)
 Pt stating he did not take to much percocet but took to much insulin . PT became responsive for EMS after narcan  and his CBG kept dropping as well. Pt stated he took 20 units of insulin .

## 2024-10-11 NOTE — ED Triage Notes (Signed)
 PT BIB [facility] from home for overdose. Pt takes percocet for pain. EMS reports CBG of 26 on their arrival. Was given 1mg  Narcan  nasally, D10 bringing cbg up to 149. EMS reports BP 85 systolic after coming back around. Last BP EMS got was 69/54, 100% RA.

## 2024-10-11 NOTE — ED Notes (Signed)
 Answered call light, pt ask for something to eat, Dr. Claudene states ok to eat, pt ad advised needs to use the bathroom and states he is ok to walk to toilet, pt states he needs to void. Advised pt to ring call light when he is finished before attempting to get off the toilet. Provided pt yogurt and graham crackers, pt ask for sandwich tray, advised pt we are currently out of trays and have ordered some, advised pt will bring him a dinner tray once we receive them

## 2024-10-11 NOTE — Assessment & Plan Note (Addendum)
 Secondary to accidental insulin (BG 26) and presumed opiate overdose given response to Narcan  Resolved with EMS treatment Neurologic checks Repeat Narcan  if needed Please refer to individual problem for management

## 2024-10-11 NOTE — ED Provider Notes (Addendum)
 "  [facility] Provider Note    Event Date/Time   First MD Initiated Contact with Patient 10/11/24 1753     (approximate)   History   Drug Overdose   HPI  Andrew Buchanan is a 54 y.o. male who presents to the ED for evaluation of Drug Overdose   Review of PCP visit from 12/16.  History of alcohol use disorder, opiate use disorder, anxiety.  Recent psychiatric hospitalization earlier in December due to suicidal threats  Patient presents from home via EMS for evaluation of poor responsiveness and possible overdose.  EMS found him unresponsive with agonal respiration and pinpoint pupils.  Supported by [employer], Narcan  provided and patient began consciousness.  Check of CBG after this noted to be in the 20s, D10 normalizing this.  Soft blood pressures, responsive to IV fluids.  Arrival to the ED he is sitting upright in the stretcher, awake alert and requesting graham crackers.  Reports he was at his home with his sister and another woman.  Reports he is not sure what happened, denies intentional overdose.   He later tells me he took 20 units of insulin  instead of 10 because I was about to eat some sweets this afternoon.  Also reports rubbing some blue stuff for horses hooves that someone gave me on ulcerative lesions to his feet but if not been healing   Physical Exam   Triage Vital Signs: ED Triage Vitals  Encounter Vitals Group     BP      Girls Systolic BP Percentile      Girls Diastolic BP Percentile      Boys Systolic BP Percentile      Boys Diastolic BP Percentile      Pulse      Resp      Temp      Temp src      SpO2      Weight      Height      Head Circumference      Peak Flow      Pain Score      Pain Loc      Pain Education      Exclude from Growth Chart     Most recent vital signs: Vitals:   10/11/24 1941 10/11/24 2000  BP: 91/67 93/66  Pulse: 79   Resp: 19 16  Temp: (!) 94.7 F (34.8 C)   SpO2: 96% 97%     General: Awake, no distress.  Oriented to year, location but not situation CV:  Good peripheral perfusion.  Resp:  Normal effort.  Abd:  No distention.  Soft and nontender MSK:  No deformity noted.  Palpation of all 4 without signs of trauma, deformity or tenderness Neuro:  No focal deficits appreciated. Other:  Right great toe has a distal ulcer and erythema throughout the toe concerning for cellulitis Covered in ?Gentian violet?  Plantar ulcer of the right foot without discharge or surrounding erythema Lateral plantar ulcer on the left foot without superimposed infectious features    ED Results / Procedures / Treatments   Labs (all labs ordered are listed, but only abnormal results are displayed) Labs Reviewed  COMPREHENSIVE METABOLIC PANEL WITH GFR - Abnormal; Notable for the following components:      Result Value   Sodium 132 (*)    Chloride 94 (*)    Glucose, Bld 45 (*)    Creatinine, Ser 1.60 (*)    Calcium  7.5 (*)  AST 49 (*)    GFR, Estimated 51 (*)    All other components within normal limits  CBC WITH DIFFERENTIAL/PLATELET - Abnormal; Notable for the following components:   WBC 11.3 (*)    RBC 3.01 (*)    Hemoglobin 10.6 (*)    HCT 30.9 (*)    MCV 102.7 (*)    MCH 35.2 (*)    Platelets 75 (*)    Neutro Abs 9.5 (*)    Abs Immature Granulocytes 0.21 (*)    All other components within normal limits  MAGNESIUM  - Abnormal; Notable for the following components:   Magnesium  1.0 (*)    All other components within normal limits  ACETAMINOPHEN  LEVEL - Abnormal; Notable for the following components:   Acetaminophen  (Tylenol ), Serum <10 (*)    All other components within normal limits  SALICYLATE LEVEL - Abnormal; Notable for the following components:   Salicylate Lvl <7.0 (*)    All other components within normal limits  CBG MONITORING, ED - Abnormal; Notable for the following components:   Glucose-Capillary 26 (*)    All other components within normal limits   CBG MONITORING, ED - Abnormal; Notable for the following components:   Glucose-Capillary 44 (*)    All other components within normal limits  CBG MONITORING, ED - Abnormal; Notable for the following components:   Glucose-Capillary 131 (*)    All other components within normal limits  CULTURE, BLOOD (ROUTINE X 2)  CULTURE, BLOOD (ROUTINE X 2)  ETHANOL  URINALYSIS, ROUTINE W REFLEX MICROSCOPIC  URINE DRUG SCREEN  LACTIC ACID, PLASMA  PROCALCITONIN  CBG MONITORING, ED    EKG Sinus rhythm with a rate of 75 bpm.  Normal axis and intervals.  No acute signs of acute ischemia.  RADIOLOGY CXR interpreted by me without evidence of acute cardiopulmonary pathology. Plain film of the right great toe interpreted by me without signs of bony erosion, soft tissue gas or fracture  Official radiology report(s): DG Toe Great Right Result Date: 10/11/2024 EXAM: 1 VIEW(S) XRAY OF THE RIGHT TOES 10/11/2024 08:16:50 PM COMPARISON: Comparison with right foot 09/14/2024. CLINICAL HISTORY: eval osteo, free air. ulcer at distal tip, cellulitic toe FINDINGS: BONES AND JOINTS: No evidence of acute fracture or dislocation of the right 1st toe. No focal bone lesion or bone destruction. No cortical destruction or sclerosis to suggest evidence of osteomyelitis. No malalignment. SOFT TISSUES: No soft tissue gas or radiopaque soft tissue foreign bodies demonstrated. IMPRESSION: 1. No evidence of osteomyelitis or soft tissue gas in the right 1st toe. 2. No acute fracture or dislocation. Electronically signed by: Elsie Gravely MD 10/11/2024 08:31 PM EST RP Workstation: [id number]   DG Chest Portable 1 View Result Date: 10/11/2024 EXAM: 1 VIEW(S) XRAY OF THE CHEST 10/11/2024 07:06:11 PM COMPARISON: 08/25/2021. CLINICAL HISTORY: overdose? unresponsive, now awake. hypoglycemic. FINDINGS: LUNGS AND PLEURA: No focal pulmonary opacity. No pleural effusion. No pneumothorax. HEART AND MEDIASTINUM: No acute abnormality of the  cardiac and mediastinal silhouettes. BONES AND SOFT TISSUES: No acute osseous abnormality. IMPRESSION: 1. No active cardiopulmonary disease. Electronically signed by: Franky Crease MD 10/11/2024 07:10 PM EST RP Workstation: [id number]    PROCEDURES and INTERVENTIONS:  .Critical Care  Performed by: Claudene Rover, MD Authorized by: Claudene Rover, MD   Critical care provider statement:    Critical care time (minutes):  30   Critical care time was exclusive of:  Separately billable procedures and treating other patients   Critical care was necessary to treat or prevent  imminent or life-threatening deterioration of the following conditions:  Endocrine crisis and metabolic crisis   Critical care was time spent personally by me on the following activities:  Development of treatment plan with patient or surrogate, discussions with consultants, evaluation of patient's response to treatment, examination of patient, ordering and review of laboratory studies, ordering and review of radiographic studies, ordering and performing treatments and interventions, pulse oximetry, re-evaluation of patient's condition and review of old charts .1-3 Lead EKG Interpretation  Performed by: Claudene Rover, MD Authorized by: Claudene Rover, MD     Interpretation: normal     ECG rate:  76   ECG rate assessment: normal     Rhythm: sinus rhythm     Ectopy: none     Conduction: normal   .Ultrasound ED Peripheral IV (Provider)  Date/Time: 10/11/2024 6:44 PM  Performed by: Claudene Rover, MD Authorized by: Claudene Rover, MD   Procedure details:    Indications: multiple failed IV attempts and poor IV access     Skin Prep: chlorhexidine  gluconate     Location: right basilic v.   Angiocath:  20 G   Bedside Ultrasound Guided: Yes     Images: not archived     Patient tolerated procedure without complications: Yes     Dressing applied: Yes     Medications  piperacillin -tazobactam (ZOSYN ) IVPB 3.375 g (has no administration in  time range)  vancomycin  (VANCOREADY) IVPB 1750 mg/350 mL (has no administration in time range)  lactated ringers  bolus 1,000 mL (1,000 mLs Intravenous New Bag/Given 10/11/24 1839)  dextrose  50 % solution 50 mL (50 mLs Intravenous Given 10/11/24 1834)     IMPRESSION / MDM / ASSESSMENT AND PLAN / ED COURSE  I reviewed the triage vital signs and the nursing notes.  Differential diagnosis includes, but is not limited to, intentional overdose, accidental overdose, sepsis, AKI,  {Patient presents with symptoms of an acute illness or injury that is potentially life-threatening.  Patient presents to the ED after being found down and unresponsive with signs of opiate overdose, hypoglycemia, AKI diabetic foot infection requiring medical admission.  On arrival to the ED he is awake and alert but noted to have a low CBG requiring additional D50 after EMS D10 infusion.  CBG normal after this and we continue to monitor.  Core temp is low at 94 and he has slight neutrophilia.  Ulcerative lesion on the right great toe with cellulitis to the great toe concerning for diabetic foot infection.  Draw cultures and broad-spectrum antibiotics were provided.  AKI on  on CMP, provide LR, hypomagnesemia.  Reassuring imaging.  Consult medicine for admission  Clinical Course as of 10/11/24 2107  [place] Oct 11, 2024  1830 Cbg 44. USIV by me, right basilic v 20g. Pt awake, asking for graham crackers.  Oriented and denies overdose [DS]  1943 I am notified of rectal temp of 94.7, will apply Bair hugger and expand diagnostic workup to include sepsis screening protocols [DS]  1956 Reassessed and discussed plan of care.  He does provide supplemental history, reports he took 20 units of NovoLog  this afternoon with a fingerstick glucose of 320.  He acknowledges he should have only taken 10 units but reports he was sitting down to eat and thought he would go ahead and just take more.  Denies any suicidal thoughts. [DS]  1957 I  discussed my concerns about possibility of sepsis considering his temperature, right great toe, blood work as well as the overall concerning presentation [DS]  2106 I consult with medicine who agrees to admit [DS]    Clinical Course User Index [DS] Claudene Rover, MD     FINAL CLINICAL IMPRESSION(S) / ED DIAGNOSES   Final diagnoses:  Insulin  overdose, undetermined intent, initial encounter  AKI (acute kidney injury)  Hypothermia, initial encounter  Diabetic foot infection (HCC)     Rx / DC Orders   ED Discharge Orders     None        Note:  This document was prepared using Dragon voice recognition software and may include unintentional dictation errors.   Claudene Rover, MD 10/11/24 7957    Claudene Rover, MD 10/11/24 2110  "

## 2024-10-11 NOTE — ED Notes (Signed)
 CCMD called for cardiac monitoring.

## 2024-10-11 NOTE — ED Notes (Signed)
 This RN and RN, Darlyn assisted pt back into his bed as pt had been up trying to urinate. There was blood on the floor from pt's right bit toe.  This RN will notify the EDP.

## 2024-10-11 NOTE — Assessment & Plan Note (Signed)
 Primary hypertension Suspect hypotension related to hypoglycemia with questionable opiate overdose as well Hold antihypertensives and resume as appropriate IV fluids for BP support

## 2024-10-11 NOTE — Assessment & Plan Note (Addendum)
 Accidental insulin  overdose, initial encounter Patient symptomatic for unresponsiveness with hypoglycemia, hypothermia and hypotension, all resolved Patient states he took twice a dose of his NovoLog  10 sugar was 320 prior to dinner Blood glucose with EMS was 26 Improved following D10 by EMS and D50 here in the ED CBG Q2 Will continue D10 at a low rate and discontinue if CBG remains above 150

## 2024-10-11 NOTE — Assessment & Plan Note (Signed)
 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

## 2024-10-11 NOTE — Assessment & Plan Note (Addendum)
 History of polysubstance abuse UDS positive for opiates and benzos Patient was unresponsive when found him responded to 1 dose of Narcan  Continue to monitor respiratory status Narcan  as needed
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
## 2024-10-11 NOTE — ED Notes (Signed)
 Pt inquiring about wanting to leave AMA. EDP to bedside.

## 2024-10-11 NOTE — ED Notes (Signed)
 Provider reached out to about pt's hypotension.

## 2024-10-11 NOTE — Progress Notes (Signed)
 Pharmacy Antibiotic Note  Andrew Buchanan is a 54 y.o. male admitted on 10/11/2024 with cellulitis.  Pharmacy has been consulted for Vanc, Zosyn  dosing.  Plan: Zosyn  3.375 gm IV X 1 given in ED over 30 min on 1/4 @ 2057. Zosyn  3.375 gm IV Q8H EI ordered to start on 1/5 @ 0300.  Vancomycin  1750 mg IV X 1 given in ED on 1/4 @ 2127. Vancomycin  1500 mg IV Q24H ordered to start on 1/5 @ 2100.  AUC = 525.1 Vanc trough = 11.7   Height: 6' (182.9 cm) Weight: 74.8 kg (165 lb) IBW/kg (Calculated) : 77.6  Temp (24hrs), Avg:96.3 F (35.7 C), Min:94.7 F (34.8 C), Max:97.8 F (36.6 C)  Recent Labs  Lab 10/11/24 1826 10/11/24 1944  WBC 11.3*  --   CREATININE 1.60*  --   LATICACIDVEN  --  2.1*    Estimated Creatinine Clearance: 56.5 mL/min (A) (by C-G formula based on SCr of 1.6 mg/dL (H)).    Allergies[1]  Antimicrobials this admission:   >>    >>   Dose adjustments this admission:   Microbiology results:  BCx:   UCx:    Sputum:    MRSA PCR:   Thank you for allowing pharmacy to be a part of this patients care.  Taneka Espiritu D 10/11/2024 11:30 PM     [1]  Allergies Allergen Reactions   Amlodipine Shortness Of Breath   Antihistamines, Chlorpheniramine-Type Anaphylaxis   Statins Other (See Comments)    Pt declines

## 2024-10-11 NOTE — Assessment & Plan Note (Signed)
 CIWA withdrawal protocol

## 2024-10-12 DIAGNOSIS — Z87828 Personal history of other (healed) physical injury and trauma: Secondary | ICD-10-CM

## 2024-10-12 LAB — BASIC METABOLIC PANEL WITH GFR
Anion gap: 13 (ref 5–15)
BUN: 23 mg/dL — ABNORMAL HIGH (ref 6–20)
CO2: 20 mmol/L — ABNORMAL LOW (ref 22–32)
Calcium: 6.6 mg/dL — ABNORMAL LOW (ref 8.9–10.3)
Chloride: 95 mmol/L — ABNORMAL LOW (ref 98–111)
Creatinine, Ser: 1.71 mg/dL — ABNORMAL HIGH (ref 0.61–1.24)
GFR, Estimated: 47 mL/min — ABNORMAL LOW
Glucose, Bld: 374 mg/dL — ABNORMAL HIGH (ref 70–99)
Potassium: 4.3 mmol/L (ref 3.5–5.1)
Sodium: 129 mmol/L — ABNORMAL LOW (ref 135–145)

## 2024-10-12 LAB — CBG MONITORING, ED
Glucose-Capillary: 411 mg/dL — ABNORMAL HIGH (ref 70–99)
Glucose-Capillary: 451 mg/dL — ABNORMAL HIGH (ref 70–99)
Glucose-Capillary: 473 mg/dL — ABNORMAL HIGH (ref 70–99)
Glucose-Capillary: 434 mg/dL — ABNORMAL HIGH (ref 70–99)
Glucose-Capillary: 368 mg/dL — ABNORMAL HIGH (ref 70–99)

## 2024-10-12 LAB — VITAMIN B12: Vitamin B-12: 658 pg/mL (ref 180–914)

## 2024-10-12 LAB — CBC
HCT: 24.9 % — ABNORMAL LOW (ref 39.0–52.0)
Hemoglobin: 8.5 g/dL — ABNORMAL LOW (ref 13.0–17.0)
MCH: 34.7 pg — ABNORMAL HIGH (ref 26.0–34.0)
MCHC: 34.1 g/dL (ref 30.0–36.0)
MCV: 101.6 fL — ABNORMAL HIGH (ref 80.0–100.0)
Platelets: 64 K/uL — ABNORMAL LOW (ref 150–400)
RBC: 2.45 MIL/uL — ABNORMAL LOW (ref 4.22–5.81)
RDW: 14.1 % (ref 11.5–15.5)
WBC: 5.6 K/uL (ref 4.0–10.5)
nRBC: 0 % (ref 0.0–0.2)

## 2024-10-12 LAB — MAGNESIUM: Magnesium: 0.9 mg/dL — CL (ref 1.7–2.4)

## 2024-10-12 MED ORDER — OXYCODONE-ACETAMINOPHEN 10-325 MG PO TABS: 1.0000 | ORAL_TABLET | Freq: Four times a day (QID) | ORAL | Status: DC | PRN

## 2024-10-12 MED ORDER — INSULIN GLARGINE 100 UNIT/ML ~~LOC~~ SOLN
15.0000 [IU] | Freq: Every day | SUBCUTANEOUS | Status: DC
  Filled 2024-10-12: qty 0.15

## 2024-10-12 MED ORDER — OXYCODONE-ACETAMINOPHEN 5-325 MG PO TABS
1.0000 | ORAL_TABLET | Freq: Four times a day (QID) | ORAL | Status: DC | PRN
  Administered 2024-10-12 (×2): 1 via ORAL
  Filled 2024-10-12 (×2): qty 1

## 2024-10-12 MED ORDER — SILVER SULFADIAZINE 1 % EX CREA
TOPICAL_CREAM | Freq: Two times a day (BID) | CUTANEOUS | Status: DC
  Filled 2024-10-12: qty 85

## 2024-10-12 MED ORDER — DIAZEPAM 5 MG/ML IJ SOLN: 0.0000 mg | Freq: Four times a day (QID) | INTRAMUSCULAR | Status: DC | PRN

## 2024-10-12 MED ORDER — INSULIN ASPART 100 UNIT/ML IJ SOLN
0.0000 [IU] | Freq: Three times a day (TID) | INTRAMUSCULAR | Status: DC
  Administered 2024-10-12: 15 [IU] via SUBCUTANEOUS
  Filled 2024-10-12: qty 15

## 2024-10-12 MED ORDER — DIAZEPAM 5 MG/ML IJ SOLN: 5.0000 mg | INTRAMUSCULAR | Status: DC | PRN

## 2024-10-12 MED ORDER — INSULIN ASPART 100 UNIT/ML IJ SOLN
5.0000 [IU] | Freq: Once | INTRAMUSCULAR | Status: AC
  Administered 2024-10-12: 5 [IU] via INTRAVENOUS
  Filled 2024-10-12: qty 5

## 2024-10-12 MED ORDER — VANCOMYCIN VARIABLE DOSE PER UNSTABLE RENAL FUNCTION (PHARMACIST DOSING): Status: DC

## 2024-10-12 MED ORDER — OXYCODONE HCL 5 MG PO TABS
5.0000 mg | ORAL_TABLET | Freq: Four times a day (QID) | ORAL | Status: DC | PRN
  Administered 2024-10-12 (×2): 5 mg via ORAL
  Filled 2024-10-12 (×2): qty 1

## 2024-10-12 MED ORDER — DIAZEPAM 5 MG PO TABS
10.0000 mg | ORAL_TABLET | ORAL | Status: DC | PRN
  Administered 2024-10-12: 10 mg via ORAL
  Filled 2024-10-12: qty 2

## 2024-10-12 MED ORDER — INSULIN ASPART 100 UNIT/ML IJ SOLN: 0.0000 [IU] | Freq: Every day | INTRAMUSCULAR | Status: DC

## 2024-10-12 MED ORDER — DIAZEPAM 5 MG PO TABS: 0.0000 mg | ORAL_TABLET | Freq: Four times a day (QID) | ORAL | Status: DC | PRN

## 2024-10-12 MED ORDER — INSULIN ASPART 100 UNIT/ML IJ SOLN
4.0000 [IU] | Freq: Once | INTRAMUSCULAR | Status: AC
  Administered 2024-10-12: 4 [IU] via INTRAVENOUS
  Filled 2024-10-12: qty 4

## 2024-10-12 NOTE — Plan of Care (Addendum)
 [id number] Patient took off telemetry and refused to be re-connected.  Patient stated, Im NOT having surgery here and I want to go home.  Hosp informed of patient's wishes and will address when able.  Patient informed that hosp has been informed and will round as needed.  9264 Patient requesting imodium  - states he's had x5 liquid stools in past 2 hrs. Dr. Informed and will address as able.  9249 Patient is angry his CBG continues to be high - stated,Its you guys responsible for not getting it down - I could knock it down better at home. I not staying in this damn place just cause my sugar is high.  0900 Patient refusing all meds, including LA insulin .  0935 Patient DCd, per patient request, with no further care.  Critical lab called - Mag 0.9, dr. Informed. Patient also informed of critical lab, but stated, Im still leaving!
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
## 2024-10-12 NOTE — ED Notes (Signed)
 Pt assisted from bedside toilet back into bed. Pt is concerned about his finger and is requesting it to be wrapped. This Nt told pt to wait on the nurse.  Pt then said 'im not waiting Ive been wrapping my finger.' Pt preceded to go into the drawers looking for gauze and tape.

## 2024-10-12 NOTE — Assessment & Plan Note (Signed)
 Silvadene  cream ordered

## 2024-10-12 NOTE — Progress Notes (Signed)
 Pharmacy Antibiotic Note  Andrew Buchanan is a 54 y.o. male with medical history significant for HTN, DM, chronic pain on chronic opiates, alcohol and polysubstance use disorder, bipolar disorder, with chronic foot ulcers, complicated by foot infection with MRSA bacteremia October 2025, treated with vancomycin  >> Cubicin  >> with plans for dalbavancin weekly by ID with which he did not follow-up admitted on 10/11/2024 with cellulitis.  Pharmacy has been consulted for vancomycin  dosing.  Plan:  Vancomycin  1.75 g IV given 10/11/24 at 2127 --Patient with Scr 1.71 today, baseline appears to be around 1-1.2. PRN Toradol  is ordered and patient has received one dose --Will dose per levels, check a 24h level 10/12/24 at 2200  Height: 6' (182.9 cm) Weight: 74.8 kg (165 lb) IBW/kg (Calculated) : 77.6  Temp (24hrs), Avg:97.6 F (36.4 C), Min:94.7 F (34.8 C), Max:99.5 F (37.5 C)  Recent Labs  Lab 10/11/24 1826 10/11/24 1944 10/12/24 0719  WBC 11.3*  --  5.6  CREATININE 1.60*  --  1.71*  LATICACIDVEN  --  2.1*  --     Estimated Creatinine Clearance: 52.9 mL/min (A) (by C-G formula based on SCr of 1.71 mg/dL (H)).    Allergies[1]  Antimicrobials this admission: Zosyn  1/4 >>  Vancomycin  1/4 >>   Dose adjustments this admission:   Microbiology results: 10/11/24 BCx: NGTD  Thank you for allowing pharmacy to be a part of this patients care.  Marolyn KATHEE Mare 10/12/2024 8:54 AM    [1]  Allergies Allergen Reactions   Amlodipine Shortness Of Breath   Antihistamines, Chlorpheniramine-Type Anaphylaxis   Statins Other (See Comments)    Pt declines

## 2024-10-12 NOTE — Discharge Instructions (Signed)

## 2024-10-12 NOTE — Inpatient Diabetes Management (Addendum)
 Inpatient Diabetes Program Recommendations  AACE/ADA: New Consensus Statement on Inpatient Glycemic Control  Target Ranges:  Prepandial:   less than 140 mg/dL      Peak postprandial:   less than 180 mg/dL (1-2 hours)      Critically ill patients:  140 - 180 mg/dL    Latest Reference Range & Units 10/11/24 18:22 10/11/24 18:28 10/11/24 19:28 10/11/24 20:32 10/11/24 22:48 10/12/24 01:00 10/12/24 02:21 10/12/24 03:53 10/12/24 05:57  Glucose-Capillary 70 - 99 mg/dL 26 (LL) 44 (LL) 93 868 (H) 147 (H) 411 (H) 451 (H) 473 (H) 434 (H)   Review of Glycemic Control  Diabetes history: DM2 Outpatient Diabetes medications: Lantus  20 units daily (not taking), Novolog  2 units TID with meals, Jardiance 10 mg daily (not taking) Current orders for Inpatient glycemic control: Novolog  0-15 units TID with meals, Novolog  0-5 units QHS  Inpatient Diabetes Program Recommendations:    Insulin : Please consider ordering Lantus  15 units Q24H.  NOTE: Patient is well known to inpatient diabetes team due to frequent ED visits and hospital admission. Patient was last inpatient 09/13/24-09/16/24. Noted patient seen PCP on 09/22/24 and per office note, Remains extremely poorly adherent to recommended treatments and refuses to use any insulin  other than short-acting, despite our trials. Most recently self-switched from the 70/30. He has irregular meal intake, does not follow a diabetic diet and has been drinking alcohol. Start Jardiance 10mg  daily. Told him firmly to call me if too expensive. If so switch to Actos . Continue his sliding scale Humalog dosing, but check blood sugars 10 minutes before meal and base it on that. Ideally this will be adjusted but he has resisted changes. Prior reported GI intolerance to metformin . Never took prior prescribed pioglitazone . Does a sliding scale of 5u/10u/15u three times a day for blood sugars of 200/300/400.   Patient admitted 10/11/24 with cellulitis of right great toe, acute toxic  encephalopathy, accidental insulin  overdose, hypotension, AKI, and drug overdose. Initial glucose 26 mg/dl on 05/12/72. Per notes, patient reported taking Novolog  20 units  instead of 10 units as he was going to eat and glucose was 320 mg/dl.  Thanks, Earnie Gainer, RN, MSN, CDCES Diabetes Coordinator Inpatient Diabetes Program [phone] (Team Pager from 8am to 5pm)

## 2024-10-12 NOTE — ED Notes (Signed)
 At this time, this NT went into this room to check CBG, pt was understanding and cooperative with task but once results came back pt asked about going home because pt bcg result was 263, pt then stated,  I gotta get home and Im not staying here. If you guys admit me I'm just going to leave and I'm not paying any type of bills. This NT told pt that billing was something I'm not in control of and he could talk to his nurse about his wishes. RN made aware, no other requests at this time.

## 2024-10-12 NOTE — TOC CM/SW Note (Signed)
 TOC consult acknowledged.  TOC does not provide Substance use counseling but Substance  use resources have been added to to the AVS for OP follow up if patient wishes to pursue.

## 2024-10-16 LAB — CULTURE, BLOOD (ROUTINE X 2)
Culture: NO GROWTH
Culture: NO GROWTH
Special Requests: ADEQUATE
